# Patient Record
Sex: Female | Born: 1945 | Race: Black or African American | Hispanic: No | Smoking: Former smoker
Health system: Southern US, Community
[De-identification: ages and names within clinical notes are randomized; demographics above are authoritative.]

## PROBLEM LIST (undated history)

## (undated) DIAGNOSIS — J40 Bronchitis, not specified as acute or chronic: Secondary | ICD-10-CM

## (undated) DIAGNOSIS — IMO0001 Reserved for inherently not codable concepts without codable children: Secondary | ICD-10-CM

## (undated) DIAGNOSIS — D573 Sickle-cell trait: Secondary | ICD-10-CM

## (undated) DIAGNOSIS — N189 Chronic kidney disease, unspecified: Secondary | ICD-10-CM

## (undated) DIAGNOSIS — E119 Type 2 diabetes mellitus without complications: Secondary | ICD-10-CM

## (undated) DIAGNOSIS — E210 Primary hyperparathyroidism: Secondary | ICD-10-CM

## (undated) DIAGNOSIS — D631 Anemia in chronic kidney disease: Secondary | ICD-10-CM

## (undated) DIAGNOSIS — R011 Cardiac murmur, unspecified: Secondary | ICD-10-CM

## (undated) DIAGNOSIS — M109 Gout, unspecified: Secondary | ICD-10-CM

## (undated) DIAGNOSIS — M1990 Unspecified osteoarthritis, unspecified site: Secondary | ICD-10-CM

## (undated) DIAGNOSIS — I10 Essential (primary) hypertension: Secondary | ICD-10-CM

## (undated) DIAGNOSIS — R062 Wheezing: Secondary | ICD-10-CM

## (undated) DIAGNOSIS — N1832 Chronic kidney disease, stage 3b: Secondary | ICD-10-CM

## (undated) DIAGNOSIS — I509 Heart failure, unspecified: Secondary | ICD-10-CM

## (undated) DIAGNOSIS — G4730 Sleep apnea, unspecified: Secondary | ICD-10-CM

## (undated) DIAGNOSIS — G4733 Obstructive sleep apnea (adult) (pediatric): Secondary | ICD-10-CM

## (undated) DIAGNOSIS — D509 Iron deficiency anemia, unspecified: Secondary | ICD-10-CM

## (undated) DIAGNOSIS — Z889 Allergy status to unspecified drugs, medicaments and biological substances status: Secondary | ICD-10-CM

## (undated) DIAGNOSIS — M4802 Spinal stenosis, cervical region: Secondary | ICD-10-CM

## (undated) DIAGNOSIS — M7989 Other specified soft tissue disorders: Secondary | ICD-10-CM

## (undated) DIAGNOSIS — J45909 Unspecified asthma, uncomplicated: Secondary | ICD-10-CM

## (undated) DIAGNOSIS — N184 Chronic kidney disease, stage 4 (severe): Secondary | ICD-10-CM

## (undated) DIAGNOSIS — E1122 Type 2 diabetes mellitus with diabetic chronic kidney disease: Secondary | ICD-10-CM

## (undated) HISTORY — DX: Unspecified asthma, uncomplicated: J45.909

## (undated) HISTORY — PX: JOINT REPLACEMENT: SHX530

## (undated) HISTORY — PX: SHOULDER SURGERY: SHX246

## (undated) HISTORY — PX: ABDOMINAL HYSTERECTOMY: SHX81

## (undated) HISTORY — PX: SHOULDER ARTHROSCOPY: SHX128

## (undated) HISTORY — PX: OTHER SURGICAL HISTORY: SHX169

## (undated) HISTORY — PX: COLONOSCOPY: SHX174

---

## 1987-01-19 HISTORY — PX: BREAST BIOPSY: SHX20

## 1999-10-05 ENCOUNTER — Encounter: Admission: RE | Admit: 1999-10-05 | Discharge: 2000-01-03 | Payer: Self-pay | Admitting: Emergency Medicine

## 1999-12-25 ENCOUNTER — Ambulatory Visit (HOSPITAL_COMMUNITY): Admission: RE | Admit: 1999-12-25 | Discharge: 1999-12-25 | Payer: Self-pay | Admitting: *Deleted

## 2002-01-16 ENCOUNTER — Encounter: Payer: Self-pay | Admitting: Orthopaedic Surgery

## 2002-01-16 ENCOUNTER — Ambulatory Visit (HOSPITAL_COMMUNITY): Admission: RE | Admit: 2002-01-16 | Discharge: 2002-01-16 | Payer: Self-pay | Admitting: Orthopaedic Surgery

## 2002-06-06 ENCOUNTER — Ambulatory Visit (HOSPITAL_COMMUNITY): Admission: RE | Admit: 2002-06-06 | Discharge: 2002-06-06 | Payer: Self-pay | Admitting: Family Medicine

## 2003-12-18 ENCOUNTER — Ambulatory Visit: Payer: Self-pay | Admitting: Internal Medicine

## 2005-10-28 ENCOUNTER — Ambulatory Visit: Payer: Self-pay | Admitting: Internal Medicine

## 2007-01-05 ENCOUNTER — Ambulatory Visit: Payer: Self-pay | Admitting: Internal Medicine

## 2008-02-21 ENCOUNTER — Ambulatory Visit: Payer: Self-pay | Admitting: Internal Medicine

## 2008-07-16 ENCOUNTER — Ambulatory Visit: Payer: Self-pay | Admitting: Internal Medicine

## 2008-08-09 ENCOUNTER — Ambulatory Visit: Payer: Self-pay | Admitting: Nephrology

## 2009-02-25 ENCOUNTER — Ambulatory Visit: Payer: Self-pay | Admitting: Internal Medicine

## 2009-07-03 ENCOUNTER — Ambulatory Visit: Payer: Self-pay | Admitting: Gastroenterology

## 2009-08-21 ENCOUNTER — Ambulatory Visit: Payer: Self-pay | Admitting: Gastroenterology

## 2009-08-25 LAB — PATHOLOGY REPORT

## 2010-01-14 ENCOUNTER — Emergency Department: Payer: Self-pay | Admitting: Emergency Medicine

## 2010-03-17 ENCOUNTER — Ambulatory Visit: Payer: Self-pay | Admitting: Internal Medicine

## 2010-10-26 ENCOUNTER — Ambulatory Visit: Payer: Self-pay | Admitting: Internal Medicine

## 2010-11-19 ENCOUNTER — Ambulatory Visit: Payer: Self-pay | Admitting: Internal Medicine

## 2011-01-11 ENCOUNTER — Ambulatory Visit: Payer: Self-pay | Admitting: Internal Medicine

## 2011-01-19 ENCOUNTER — Ambulatory Visit: Payer: Self-pay | Admitting: Internal Medicine

## 2011-01-21 DIAGNOSIS — L219 Seborrheic dermatitis, unspecified: Secondary | ICD-10-CM | POA: Diagnosis not present

## 2011-01-21 DIAGNOSIS — T7840XA Allergy, unspecified, initial encounter: Secondary | ICD-10-CM | POA: Diagnosis not present

## 2011-01-29 DIAGNOSIS — J209 Acute bronchitis, unspecified: Secondary | ICD-10-CM | POA: Diagnosis not present

## 2011-01-29 DIAGNOSIS — K219 Gastro-esophageal reflux disease without esophagitis: Secondary | ICD-10-CM | POA: Diagnosis not present

## 2011-01-29 DIAGNOSIS — E1159 Type 2 diabetes mellitus with other circulatory complications: Secondary | ICD-10-CM | POA: Diagnosis not present

## 2011-01-29 DIAGNOSIS — J309 Allergic rhinitis, unspecified: Secondary | ICD-10-CM | POA: Diagnosis not present

## 2011-01-29 DIAGNOSIS — I509 Heart failure, unspecified: Secondary | ICD-10-CM | POA: Diagnosis not present

## 2011-01-29 DIAGNOSIS — I110 Hypertensive heart disease with heart failure: Secondary | ICD-10-CM | POA: Diagnosis not present

## 2011-03-08 ENCOUNTER — Ambulatory Visit: Payer: Self-pay | Admitting: Nephrology

## 2011-03-08 DIAGNOSIS — E213 Hyperparathyroidism, unspecified: Secondary | ICD-10-CM | POA: Diagnosis not present

## 2011-03-30 ENCOUNTER — Ambulatory Visit: Payer: Self-pay | Admitting: Internal Medicine

## 2011-04-24 ENCOUNTER — Ambulatory Visit: Payer: Self-pay | Admitting: Internal Medicine

## 2011-06-15 DIAGNOSIS — H4010X0 Unspecified open-angle glaucoma, stage unspecified: Secondary | ICD-10-CM | POA: Diagnosis not present

## 2011-07-05 DIAGNOSIS — J309 Allergic rhinitis, unspecified: Secondary | ICD-10-CM | POA: Diagnosis not present

## 2011-07-05 DIAGNOSIS — J019 Acute sinusitis, unspecified: Secondary | ICD-10-CM | POA: Diagnosis not present

## 2011-07-05 DIAGNOSIS — M542 Cervicalgia: Secondary | ICD-10-CM | POA: Diagnosis not present

## 2011-07-05 DIAGNOSIS — I10 Essential (primary) hypertension: Secondary | ICD-10-CM | POA: Diagnosis not present

## 2011-07-05 DIAGNOSIS — E119 Type 2 diabetes mellitus without complications: Secondary | ICD-10-CM | POA: Diagnosis not present

## 2011-07-08 DIAGNOSIS — D649 Anemia, unspecified: Secondary | ICD-10-CM | POA: Diagnosis not present

## 2011-07-08 DIAGNOSIS — I43 Cardiomyopathy in diseases classified elsewhere: Secondary | ICD-10-CM | POA: Diagnosis not present

## 2011-07-08 DIAGNOSIS — I10 Essential (primary) hypertension: Secondary | ICD-10-CM | POA: Diagnosis not present

## 2011-07-08 DIAGNOSIS — M109 Gout, unspecified: Secondary | ICD-10-CM | POA: Diagnosis not present

## 2011-07-08 DIAGNOSIS — R0989 Other specified symptoms and signs involving the circulatory and respiratory systems: Secondary | ICD-10-CM | POA: Diagnosis not present

## 2011-07-08 DIAGNOSIS — R0609 Other forms of dyspnea: Secondary | ICD-10-CM | POA: Diagnosis not present

## 2011-07-08 DIAGNOSIS — IMO0002 Reserved for concepts with insufficient information to code with codable children: Secondary | ICD-10-CM | POA: Diagnosis not present

## 2011-07-08 DIAGNOSIS — E1065 Type 1 diabetes mellitus with hyperglycemia: Secondary | ICD-10-CM | POA: Diagnosis not present

## 2011-07-08 DIAGNOSIS — E782 Mixed hyperlipidemia: Secondary | ICD-10-CM | POA: Diagnosis not present

## 2011-07-27 DIAGNOSIS — I359 Nonrheumatic aortic valve disorder, unspecified: Secondary | ICD-10-CM | POA: Diagnosis not present

## 2011-07-27 DIAGNOSIS — G4730 Sleep apnea, unspecified: Secondary | ICD-10-CM | POA: Diagnosis not present

## 2011-07-27 DIAGNOSIS — G4710 Hypersomnia, unspecified: Secondary | ICD-10-CM | POA: Diagnosis not present

## 2011-07-27 DIAGNOSIS — I110 Hypertensive heart disease with heart failure: Secondary | ICD-10-CM | POA: Diagnosis not present

## 2011-08-16 DIAGNOSIS — J019 Acute sinusitis, unspecified: Secondary | ICD-10-CM | POA: Diagnosis not present

## 2011-08-16 DIAGNOSIS — J309 Allergic rhinitis, unspecified: Secondary | ICD-10-CM | POA: Diagnosis not present

## 2011-08-16 DIAGNOSIS — J069 Acute upper respiratory infection, unspecified: Secondary | ICD-10-CM | POA: Diagnosis not present

## 2011-10-14 DIAGNOSIS — M109 Gout, unspecified: Secondary | ICD-10-CM | POA: Diagnosis not present

## 2011-10-14 DIAGNOSIS — J309 Allergic rhinitis, unspecified: Secondary | ICD-10-CM | POA: Diagnosis not present

## 2011-10-14 DIAGNOSIS — E1065 Type 1 diabetes mellitus with hyperglycemia: Secondary | ICD-10-CM | POA: Diagnosis not present

## 2011-10-14 DIAGNOSIS — IMO0002 Reserved for concepts with insufficient information to code with codable children: Secondary | ICD-10-CM | POA: Diagnosis not present

## 2011-10-14 DIAGNOSIS — D649 Anemia, unspecified: Secondary | ICD-10-CM | POA: Diagnosis not present

## 2011-10-14 DIAGNOSIS — I43 Cardiomyopathy in diseases classified elsewhere: Secondary | ICD-10-CM | POA: Diagnosis not present

## 2011-10-14 DIAGNOSIS — I10 Essential (primary) hypertension: Secondary | ICD-10-CM | POA: Diagnosis not present

## 2011-11-01 DIAGNOSIS — H4010X0 Unspecified open-angle glaucoma, stage unspecified: Secondary | ICD-10-CM | POA: Diagnosis not present

## 2011-11-13 DIAGNOSIS — Z23 Encounter for immunization: Secondary | ICD-10-CM | POA: Diagnosis not present

## 2011-12-07 DIAGNOSIS — G4710 Hypersomnia, unspecified: Secondary | ICD-10-CM | POA: Diagnosis not present

## 2011-12-07 DIAGNOSIS — I359 Nonrheumatic aortic valve disorder, unspecified: Secondary | ICD-10-CM | POA: Diagnosis not present

## 2011-12-07 DIAGNOSIS — I110 Hypertensive heart disease with heart failure: Secondary | ICD-10-CM | POA: Diagnosis not present

## 2012-01-03 DIAGNOSIS — K219 Gastro-esophageal reflux disease without esophagitis: Secondary | ICD-10-CM | POA: Diagnosis not present

## 2012-01-03 DIAGNOSIS — M109 Gout, unspecified: Secondary | ICD-10-CM | POA: Diagnosis not present

## 2012-01-03 DIAGNOSIS — E785 Hyperlipidemia, unspecified: Secondary | ICD-10-CM | POA: Diagnosis not present

## 2012-01-03 DIAGNOSIS — I509 Heart failure, unspecified: Secondary | ICD-10-CM | POA: Diagnosis not present

## 2012-01-03 DIAGNOSIS — IMO0001 Reserved for inherently not codable concepts without codable children: Secondary | ICD-10-CM | POA: Diagnosis not present

## 2012-01-03 DIAGNOSIS — I110 Hypertensive heart disease with heart failure: Secondary | ICD-10-CM | POA: Diagnosis not present

## 2012-03-27 DIAGNOSIS — IMO0002 Reserved for concepts with insufficient information to code with codable children: Secondary | ICD-10-CM | POA: Diagnosis not present

## 2012-03-27 DIAGNOSIS — E782 Mixed hyperlipidemia: Secondary | ICD-10-CM | POA: Diagnosis not present

## 2012-03-27 DIAGNOSIS — I10 Essential (primary) hypertension: Secondary | ICD-10-CM | POA: Diagnosis not present

## 2012-04-04 ENCOUNTER — Ambulatory Visit: Payer: Self-pay | Admitting: Internal Medicine

## 2012-04-04 DIAGNOSIS — Z1231 Encounter for screening mammogram for malignant neoplasm of breast: Secondary | ICD-10-CM | POA: Diagnosis not present

## 2012-04-10 DIAGNOSIS — IMO0001 Reserved for inherently not codable concepts without codable children: Secondary | ICD-10-CM | POA: Diagnosis not present

## 2012-04-10 DIAGNOSIS — Q846 Other congenital malformations of nails: Secondary | ICD-10-CM | POA: Diagnosis not present

## 2012-04-10 DIAGNOSIS — M1710 Unilateral primary osteoarthritis, unspecified knee: Secondary | ICD-10-CM | POA: Diagnosis not present

## 2012-04-10 DIAGNOSIS — E785 Hyperlipidemia, unspecified: Secondary | ICD-10-CM | POA: Diagnosis not present

## 2012-04-10 DIAGNOSIS — I10 Essential (primary) hypertension: Secondary | ICD-10-CM | POA: Diagnosis not present

## 2012-04-12 DIAGNOSIS — N183 Chronic kidney disease, stage 3 unspecified: Secondary | ICD-10-CM | POA: Diagnosis not present

## 2012-04-12 DIAGNOSIS — I10 Essential (primary) hypertension: Secondary | ICD-10-CM | POA: Diagnosis not present

## 2012-04-12 DIAGNOSIS — E210 Primary hyperparathyroidism: Secondary | ICD-10-CM | POA: Diagnosis not present

## 2012-04-12 DIAGNOSIS — D631 Anemia in chronic kidney disease: Secondary | ICD-10-CM | POA: Diagnosis not present

## 2012-04-13 DIAGNOSIS — M1710 Unilateral primary osteoarthritis, unspecified knee: Secondary | ICD-10-CM | POA: Diagnosis not present

## 2012-04-13 DIAGNOSIS — IMO0002 Reserved for concepts with insufficient information to code with codable children: Secondary | ICD-10-CM | POA: Diagnosis not present

## 2012-04-17 DIAGNOSIS — E119 Type 2 diabetes mellitus without complications: Secondary | ICD-10-CM | POA: Diagnosis not present

## 2012-04-17 DIAGNOSIS — B351 Tinea unguium: Secondary | ICD-10-CM | POA: Diagnosis not present

## 2012-04-20 DIAGNOSIS — Z006 Encounter for examination for normal comparison and control in clinical research program: Secondary | ICD-10-CM | POA: Diagnosis not present

## 2012-04-20 DIAGNOSIS — R05 Cough: Secondary | ICD-10-CM | POA: Diagnosis not present

## 2012-04-20 DIAGNOSIS — R059 Cough, unspecified: Secondary | ICD-10-CM | POA: Diagnosis not present

## 2012-04-28 DIAGNOSIS — I6529 Occlusion and stenosis of unspecified carotid artery: Secondary | ICD-10-CM | POA: Diagnosis not present

## 2012-05-27 ENCOUNTER — Ambulatory Visit: Payer: Self-pay | Admitting: Emergency Medicine

## 2012-06-13 DIAGNOSIS — H4010X0 Unspecified open-angle glaucoma, stage unspecified: Secondary | ICD-10-CM | POA: Diagnosis not present

## 2012-07-11 DIAGNOSIS — I509 Heart failure, unspecified: Secondary | ICD-10-CM | POA: Diagnosis not present

## 2012-07-11 DIAGNOSIS — N183 Chronic kidney disease, stage 3 unspecified: Secondary | ICD-10-CM | POA: Diagnosis not present

## 2012-07-11 DIAGNOSIS — E1159 Type 2 diabetes mellitus with other circulatory complications: Secondary | ICD-10-CM | POA: Diagnosis not present

## 2012-07-11 DIAGNOSIS — M109 Gout, unspecified: Secondary | ICD-10-CM | POA: Diagnosis not present

## 2012-07-11 DIAGNOSIS — I110 Hypertensive heart disease with heart failure: Secondary | ICD-10-CM | POA: Diagnosis not present

## 2012-07-11 DIAGNOSIS — M159 Polyosteoarthritis, unspecified: Secondary | ICD-10-CM | POA: Diagnosis not present

## 2012-07-13 DIAGNOSIS — G4730 Sleep apnea, unspecified: Secondary | ICD-10-CM | POA: Diagnosis not present

## 2012-07-13 DIAGNOSIS — G4710 Hypersomnia, unspecified: Secondary | ICD-10-CM | POA: Diagnosis not present

## 2012-08-02 DIAGNOSIS — D631 Anemia in chronic kidney disease: Secondary | ICD-10-CM | POA: Diagnosis not present

## 2012-08-02 DIAGNOSIS — N183 Chronic kidney disease, stage 3 unspecified: Secondary | ICD-10-CM | POA: Diagnosis not present

## 2012-08-02 DIAGNOSIS — I10 Essential (primary) hypertension: Secondary | ICD-10-CM | POA: Diagnosis not present

## 2012-08-02 DIAGNOSIS — E210 Primary hyperparathyroidism: Secondary | ICD-10-CM | POA: Diagnosis not present

## 2012-08-02 DIAGNOSIS — N039 Chronic nephritic syndrome with unspecified morphologic changes: Secondary | ICD-10-CM | POA: Diagnosis not present

## 2012-08-12 ENCOUNTER — Ambulatory Visit: Payer: Self-pay

## 2012-08-12 DIAGNOSIS — IMO0002 Reserved for concepts with insufficient information to code with codable children: Secondary | ICD-10-CM | POA: Diagnosis not present

## 2012-08-17 DIAGNOSIS — M1710 Unilateral primary osteoarthritis, unspecified knee: Secondary | ICD-10-CM | POA: Diagnosis not present

## 2012-08-17 DIAGNOSIS — IMO0002 Reserved for concepts with insufficient information to code with codable children: Secondary | ICD-10-CM | POA: Diagnosis not present

## 2012-09-29 DIAGNOSIS — IMO0002 Reserved for concepts with insufficient information to code with codable children: Secondary | ICD-10-CM | POA: Diagnosis not present

## 2012-09-29 DIAGNOSIS — M1710 Unilateral primary osteoarthritis, unspecified knee: Secondary | ICD-10-CM | POA: Diagnosis not present

## 2012-10-10 DIAGNOSIS — N183 Chronic kidney disease, stage 3 unspecified: Secondary | ICD-10-CM | POA: Diagnosis not present

## 2012-10-10 DIAGNOSIS — Z79899 Other long term (current) drug therapy: Secondary | ICD-10-CM | POA: Diagnosis not present

## 2012-10-10 DIAGNOSIS — I110 Hypertensive heart disease with heart failure: Secondary | ICD-10-CM | POA: Diagnosis not present

## 2012-10-10 DIAGNOSIS — E782 Mixed hyperlipidemia: Secondary | ICD-10-CM | POA: Diagnosis not present

## 2012-10-10 DIAGNOSIS — IMO0002 Reserved for concepts with insufficient information to code with codable children: Secondary | ICD-10-CM | POA: Diagnosis not present

## 2012-10-10 DIAGNOSIS — E1065 Type 1 diabetes mellitus with hyperglycemia: Secondary | ICD-10-CM | POA: Diagnosis not present

## 2012-10-10 DIAGNOSIS — M159 Polyosteoarthritis, unspecified: Secondary | ICD-10-CM | POA: Diagnosis not present

## 2012-10-16 DIAGNOSIS — J309 Allergic rhinitis, unspecified: Secondary | ICD-10-CM | POA: Diagnosis not present

## 2012-10-16 DIAGNOSIS — J019 Acute sinusitis, unspecified: Secondary | ICD-10-CM | POA: Diagnosis not present

## 2012-10-17 ENCOUNTER — Ambulatory Visit: Payer: Self-pay | Admitting: Unknown Physician Specialty

## 2012-10-17 DIAGNOSIS — M25569 Pain in unspecified knee: Secondary | ICD-10-CM | POA: Diagnosis not present

## 2012-11-21 DIAGNOSIS — M1710 Unilateral primary osteoarthritis, unspecified knee: Secondary | ICD-10-CM | POA: Diagnosis not present

## 2012-11-21 DIAGNOSIS — IMO0002 Reserved for concepts with insufficient information to code with codable children: Secondary | ICD-10-CM | POA: Diagnosis not present

## 2012-11-24 DIAGNOSIS — D631 Anemia in chronic kidney disease: Secondary | ICD-10-CM | POA: Diagnosis not present

## 2012-11-24 DIAGNOSIS — I10 Essential (primary) hypertension: Secondary | ICD-10-CM | POA: Diagnosis not present

## 2012-11-24 DIAGNOSIS — N039 Chronic nephritic syndrome with unspecified morphologic changes: Secondary | ICD-10-CM | POA: Diagnosis not present

## 2012-11-24 DIAGNOSIS — N2581 Secondary hyperparathyroidism of renal origin: Secondary | ICD-10-CM | POA: Diagnosis not present

## 2012-11-24 DIAGNOSIS — N183 Chronic kidney disease, stage 3 unspecified: Secondary | ICD-10-CM | POA: Diagnosis not present

## 2012-12-12 DIAGNOSIS — E782 Mixed hyperlipidemia: Secondary | ICD-10-CM | POA: Diagnosis not present

## 2012-12-12 DIAGNOSIS — IMO0002 Reserved for concepts with insufficient information to code with codable children: Secondary | ICD-10-CM | POA: Diagnosis not present

## 2012-12-12 DIAGNOSIS — E1065 Type 1 diabetes mellitus with hyperglycemia: Secondary | ICD-10-CM | POA: Diagnosis not present

## 2012-12-12 DIAGNOSIS — Z79899 Other long term (current) drug therapy: Secondary | ICD-10-CM | POA: Diagnosis not present

## 2012-12-12 DIAGNOSIS — N183 Chronic kidney disease, stage 3 unspecified: Secondary | ICD-10-CM | POA: Diagnosis not present

## 2012-12-12 DIAGNOSIS — I110 Hypertensive heart disease with heart failure: Secondary | ICD-10-CM | POA: Diagnosis not present

## 2012-12-12 DIAGNOSIS — M159 Polyosteoarthritis, unspecified: Secondary | ICD-10-CM | POA: Diagnosis not present

## 2013-01-04 DIAGNOSIS — H35379 Puckering of macula, unspecified eye: Secondary | ICD-10-CM | POA: Diagnosis not present

## 2013-01-08 DIAGNOSIS — G4710 Hypersomnia, unspecified: Secondary | ICD-10-CM | POA: Diagnosis not present

## 2013-01-08 DIAGNOSIS — J019 Acute sinusitis, unspecified: Secondary | ICD-10-CM | POA: Diagnosis not present

## 2013-01-26 DIAGNOSIS — E119 Type 2 diabetes mellitus without complications: Secondary | ICD-10-CM | POA: Diagnosis not present

## 2013-01-26 DIAGNOSIS — I10 Essential (primary) hypertension: Secondary | ICD-10-CM | POA: Diagnosis not present

## 2013-01-26 DIAGNOSIS — E782 Mixed hyperlipidemia: Secondary | ICD-10-CM | POA: Diagnosis not present

## 2013-02-08 DIAGNOSIS — I110 Hypertensive heart disease with heart failure: Secondary | ICD-10-CM | POA: Diagnosis not present

## 2013-02-08 DIAGNOSIS — H01139 Eczematous dermatitis of unspecified eye, unspecified eyelid: Secondary | ICD-10-CM | POA: Diagnosis not present

## 2013-02-08 DIAGNOSIS — I509 Heart failure, unspecified: Secondary | ICD-10-CM | POA: Diagnosis not present

## 2013-02-08 DIAGNOSIS — N183 Chronic kidney disease, stage 3 unspecified: Secondary | ICD-10-CM | POA: Diagnosis not present

## 2013-02-08 DIAGNOSIS — IMO0001 Reserved for inherently not codable concepts without codable children: Secondary | ICD-10-CM | POA: Diagnosis not present

## 2013-02-08 DIAGNOSIS — R609 Edema, unspecified: Secondary | ICD-10-CM | POA: Diagnosis not present

## 2013-02-19 DIAGNOSIS — R609 Edema, unspecified: Secondary | ICD-10-CM | POA: Diagnosis not present

## 2013-02-19 DIAGNOSIS — H01139 Eczematous dermatitis of unspecified eye, unspecified eyelid: Secondary | ICD-10-CM | POA: Diagnosis not present

## 2013-02-19 DIAGNOSIS — N183 Chronic kidney disease, stage 3 unspecified: Secondary | ICD-10-CM | POA: Diagnosis not present

## 2013-02-19 DIAGNOSIS — I110 Hypertensive heart disease with heart failure: Secondary | ICD-10-CM | POA: Diagnosis not present

## 2013-02-19 DIAGNOSIS — IMO0001 Reserved for inherently not codable concepts without codable children: Secondary | ICD-10-CM | POA: Diagnosis not present

## 2013-02-19 DIAGNOSIS — I509 Heart failure, unspecified: Secondary | ICD-10-CM | POA: Diagnosis not present

## 2013-03-29 DIAGNOSIS — IMO0002 Reserved for concepts with insufficient information to code with codable children: Secondary | ICD-10-CM | POA: Diagnosis not present

## 2013-03-29 DIAGNOSIS — M1710 Unilateral primary osteoarthritis, unspecified knee: Secondary | ICD-10-CM | POA: Diagnosis not present

## 2013-05-03 DIAGNOSIS — M1710 Unilateral primary osteoarthritis, unspecified knee: Secondary | ICD-10-CM | POA: Diagnosis not present

## 2013-05-03 DIAGNOSIS — IMO0002 Reserved for concepts with insufficient information to code with codable children: Secondary | ICD-10-CM | POA: Diagnosis not present

## 2013-05-29 DIAGNOSIS — R0989 Other specified symptoms and signs involving the circulatory and respiratory systems: Secondary | ICD-10-CM | POA: Diagnosis not present

## 2013-05-29 DIAGNOSIS — E1159 Type 2 diabetes mellitus with other circulatory complications: Secondary | ICD-10-CM | POA: Diagnosis not present

## 2013-05-29 DIAGNOSIS — J309 Allergic rhinitis, unspecified: Secondary | ICD-10-CM | POA: Diagnosis not present

## 2013-05-29 DIAGNOSIS — R0609 Other forms of dyspnea: Secondary | ICD-10-CM | POA: Diagnosis not present

## 2013-05-29 DIAGNOSIS — N183 Chronic kidney disease, stage 3 unspecified: Secondary | ICD-10-CM | POA: Diagnosis not present

## 2013-06-18 DIAGNOSIS — IMO0001 Reserved for inherently not codable concepts without codable children: Secondary | ICD-10-CM | POA: Diagnosis not present

## 2013-06-18 DIAGNOSIS — I10 Essential (primary) hypertension: Secondary | ICD-10-CM | POA: Diagnosis not present

## 2013-06-21 ENCOUNTER — Ambulatory Visit: Payer: Self-pay | Admitting: Internal Medicine

## 2013-07-16 DIAGNOSIS — E1159 Type 2 diabetes mellitus with other circulatory complications: Secondary | ICD-10-CM | POA: Diagnosis not present

## 2013-07-16 DIAGNOSIS — M810 Age-related osteoporosis without current pathological fracture: Secondary | ICD-10-CM | POA: Diagnosis not present

## 2013-07-16 DIAGNOSIS — I10 Essential (primary) hypertension: Secondary | ICD-10-CM | POA: Diagnosis not present

## 2013-07-16 DIAGNOSIS — G4730 Sleep apnea, unspecified: Secondary | ICD-10-CM | POA: Diagnosis not present

## 2013-07-16 DIAGNOSIS — D638 Anemia in other chronic diseases classified elsewhere: Secondary | ICD-10-CM | POA: Diagnosis not present

## 2013-07-16 DIAGNOSIS — E785 Hyperlipidemia, unspecified: Secondary | ICD-10-CM | POA: Diagnosis not present

## 2013-07-16 DIAGNOSIS — N183 Chronic kidney disease, stage 3 unspecified: Secondary | ICD-10-CM | POA: Diagnosis not present

## 2013-07-17 ENCOUNTER — Ambulatory Visit: Payer: Self-pay | Admitting: Unknown Physician Specialty

## 2013-07-17 DIAGNOSIS — M1710 Unilateral primary osteoarthritis, unspecified knee: Secondary | ICD-10-CM | POA: Diagnosis not present

## 2013-07-17 DIAGNOSIS — IMO0002 Reserved for concepts with insufficient information to code with codable children: Secondary | ICD-10-CM | POA: Diagnosis not present

## 2013-07-18 DIAGNOSIS — G4710 Hypersomnia, unspecified: Secondary | ICD-10-CM | POA: Diagnosis not present

## 2013-07-18 DIAGNOSIS — J309 Allergic rhinitis, unspecified: Secondary | ICD-10-CM | POA: Diagnosis not present

## 2013-07-18 DIAGNOSIS — G4730 Sleep apnea, unspecified: Secondary | ICD-10-CM | POA: Diagnosis not present

## 2013-07-30 DIAGNOSIS — R609 Edema, unspecified: Secondary | ICD-10-CM | POA: Diagnosis not present

## 2013-07-30 DIAGNOSIS — E1159 Type 2 diabetes mellitus with other circulatory complications: Secondary | ICD-10-CM | POA: Diagnosis not present

## 2013-07-30 DIAGNOSIS — R21 Rash and other nonspecific skin eruption: Secondary | ICD-10-CM | POA: Diagnosis not present

## 2013-07-30 DIAGNOSIS — I110 Hypertensive heart disease with heart failure: Secondary | ICD-10-CM | POA: Diagnosis not present

## 2013-08-02 DIAGNOSIS — M1990 Unspecified osteoarthritis, unspecified site: Secondary | ICD-10-CM | POA: Insufficient documentation

## 2013-08-07 DIAGNOSIS — M159 Polyosteoarthritis, unspecified: Secondary | ICD-10-CM | POA: Diagnosis not present

## 2013-08-07 DIAGNOSIS — G4730 Sleep apnea, unspecified: Secondary | ICD-10-CM | POA: Diagnosis not present

## 2013-08-07 DIAGNOSIS — M25569 Pain in unspecified knee: Secondary | ICD-10-CM | POA: Diagnosis not present

## 2013-08-07 DIAGNOSIS — N183 Chronic kidney disease, stage 3 unspecified: Secondary | ICD-10-CM | POA: Diagnosis not present

## 2013-08-07 DIAGNOSIS — IMO0001 Reserved for inherently not codable concepts without codable children: Secondary | ICD-10-CM | POA: Diagnosis not present

## 2013-08-09 DIAGNOSIS — G4710 Hypersomnia, unspecified: Secondary | ICD-10-CM | POA: Diagnosis not present

## 2013-08-09 DIAGNOSIS — G4730 Sleep apnea, unspecified: Secondary | ICD-10-CM | POA: Diagnosis not present

## 2013-08-09 DIAGNOSIS — G4720 Circadian rhythm sleep disorder, unspecified type: Secondary | ICD-10-CM | POA: Diagnosis not present

## 2013-08-13 DIAGNOSIS — M1710 Unilateral primary osteoarthritis, unspecified knee: Secondary | ICD-10-CM | POA: Diagnosis not present

## 2013-08-13 DIAGNOSIS — IMO0002 Reserved for concepts with insufficient information to code with codable children: Secondary | ICD-10-CM | POA: Diagnosis not present

## 2013-08-13 HISTORY — PX: MEDIAL PARTIAL KNEE REPLACEMENT: SHX5965

## 2013-08-14 DIAGNOSIS — I10 Essential (primary) hypertension: Secondary | ICD-10-CM | POA: Diagnosis not present

## 2013-08-14 DIAGNOSIS — E119 Type 2 diabetes mellitus without complications: Secondary | ICD-10-CM | POA: Diagnosis not present

## 2013-08-14 DIAGNOSIS — E785 Hyperlipidemia, unspecified: Secondary | ICD-10-CM | POA: Diagnosis not present

## 2013-08-14 DIAGNOSIS — M1990 Unspecified osteoarthritis, unspecified site: Secondary | ICD-10-CM | POA: Diagnosis not present

## 2013-08-16 ENCOUNTER — Encounter: Payer: Self-pay | Admitting: Internal Medicine

## 2013-08-17 DIAGNOSIS — M159 Polyosteoarthritis, unspecified: Secondary | ICD-10-CM | POA: Diagnosis not present

## 2013-08-17 DIAGNOSIS — I509 Heart failure, unspecified: Secondary | ICD-10-CM | POA: Diagnosis not present

## 2013-08-17 DIAGNOSIS — M109 Gout, unspecified: Secondary | ICD-10-CM | POA: Diagnosis not present

## 2013-08-17 LAB — BASIC METABOLIC PANEL
Anion Gap: 7 (ref 7–16)
BUN: 31 mg/dL — ABNORMAL HIGH (ref 7–18)
Calcium, Total: 9.2 mg/dL (ref 8.5–10.1)
Chloride: 109 mmol/L — ABNORMAL HIGH (ref 98–107)
Co2: 25 mmol/L (ref 21–32)
Creatinine: 1.8 mg/dL — ABNORMAL HIGH (ref 0.60–1.30)
EGFR (African American): 33 — ABNORMAL LOW
EGFR (Non-African Amer.): 28 — ABNORMAL LOW
Glucose: 110 mg/dL — ABNORMAL HIGH (ref 65–99)
Osmolality: 288 (ref 275–301)
Potassium: 4.3 mmol/L (ref 3.5–5.1)
Sodium: 141 mmol/L (ref 136–145)

## 2013-08-18 ENCOUNTER — Encounter: Payer: Self-pay | Admitting: Internal Medicine

## 2013-08-21 DIAGNOSIS — Z96659 Presence of unspecified artificial knee joint: Secondary | ICD-10-CM | POA: Diagnosis not present

## 2013-08-21 DIAGNOSIS — Z471 Aftercare following joint replacement surgery: Secondary | ICD-10-CM | POA: Diagnosis not present

## 2013-08-21 LAB — BASIC METABOLIC PANEL
Anion Gap: 6 — ABNORMAL LOW (ref 7–16)
BUN: 36 mg/dL — ABNORMAL HIGH (ref 7–18)
Calcium, Total: 10.4 mg/dL — ABNORMAL HIGH (ref 8.5–10.1)
Chloride: 108 mmol/L — ABNORMAL HIGH (ref 98–107)
Co2: 29 mmol/L (ref 21–32)
Creatinine: 1.51 mg/dL — ABNORMAL HIGH (ref 0.60–1.30)
EGFR (African American): 41 — ABNORMAL LOW
EGFR (Non-African Amer.): 35 — ABNORMAL LOW
Glucose: 86 mg/dL (ref 65–99)
Osmolality: 293 (ref 275–301)
Potassium: 4.2 mmol/L (ref 3.5–5.1)
Sodium: 143 mmol/L (ref 136–145)

## 2013-08-30 DIAGNOSIS — M6281 Muscle weakness (generalized): Secondary | ICD-10-CM | POA: Diagnosis not present

## 2013-08-30 DIAGNOSIS — M25669 Stiffness of unspecified knee, not elsewhere classified: Secondary | ICD-10-CM | POA: Diagnosis not present

## 2013-08-30 DIAGNOSIS — Z96659 Presence of unspecified artificial knee joint: Secondary | ICD-10-CM | POA: Diagnosis not present

## 2013-08-30 DIAGNOSIS — M25569 Pain in unspecified knee: Secondary | ICD-10-CM | POA: Diagnosis not present

## 2013-09-04 DIAGNOSIS — M25569 Pain in unspecified knee: Secondary | ICD-10-CM | POA: Diagnosis not present

## 2013-09-04 DIAGNOSIS — M25669 Stiffness of unspecified knee, not elsewhere classified: Secondary | ICD-10-CM | POA: Diagnosis not present

## 2013-09-04 DIAGNOSIS — Z96659 Presence of unspecified artificial knee joint: Secondary | ICD-10-CM | POA: Diagnosis not present

## 2013-09-04 DIAGNOSIS — M6281 Muscle weakness (generalized): Secondary | ICD-10-CM | POA: Diagnosis not present

## 2013-09-05 DIAGNOSIS — Z96659 Presence of unspecified artificial knee joint: Secondary | ICD-10-CM | POA: Diagnosis not present

## 2013-09-05 DIAGNOSIS — M25569 Pain in unspecified knee: Secondary | ICD-10-CM | POA: Diagnosis not present

## 2013-09-05 DIAGNOSIS — M6281 Muscle weakness (generalized): Secondary | ICD-10-CM | POA: Diagnosis not present

## 2013-09-05 DIAGNOSIS — M25669 Stiffness of unspecified knee, not elsewhere classified: Secondary | ICD-10-CM | POA: Diagnosis not present

## 2013-09-07 DIAGNOSIS — M25669 Stiffness of unspecified knee, not elsewhere classified: Secondary | ICD-10-CM | POA: Diagnosis not present

## 2013-09-07 DIAGNOSIS — M25569 Pain in unspecified knee: Secondary | ICD-10-CM | POA: Diagnosis not present

## 2013-09-07 DIAGNOSIS — Z96659 Presence of unspecified artificial knee joint: Secondary | ICD-10-CM | POA: Diagnosis not present

## 2013-09-07 DIAGNOSIS — M6281 Muscle weakness (generalized): Secondary | ICD-10-CM | POA: Diagnosis not present

## 2013-09-10 DIAGNOSIS — M25669 Stiffness of unspecified knee, not elsewhere classified: Secondary | ICD-10-CM | POA: Diagnosis not present

## 2013-09-10 DIAGNOSIS — M6281 Muscle weakness (generalized): Secondary | ICD-10-CM | POA: Diagnosis not present

## 2013-09-10 DIAGNOSIS — M25569 Pain in unspecified knee: Secondary | ICD-10-CM | POA: Diagnosis not present

## 2013-09-10 DIAGNOSIS — Z96659 Presence of unspecified artificial knee joint: Secondary | ICD-10-CM | POA: Diagnosis not present

## 2013-09-12 DIAGNOSIS — M25569 Pain in unspecified knee: Secondary | ICD-10-CM | POA: Diagnosis not present

## 2013-09-12 DIAGNOSIS — M25669 Stiffness of unspecified knee, not elsewhere classified: Secondary | ICD-10-CM | POA: Diagnosis not present

## 2013-09-12 DIAGNOSIS — M6281 Muscle weakness (generalized): Secondary | ICD-10-CM | POA: Diagnosis not present

## 2013-09-12 DIAGNOSIS — Z96659 Presence of unspecified artificial knee joint: Secondary | ICD-10-CM | POA: Diagnosis not present

## 2013-09-14 DIAGNOSIS — M6281 Muscle weakness (generalized): Secondary | ICD-10-CM | POA: Diagnosis not present

## 2013-09-14 DIAGNOSIS — M25569 Pain in unspecified knee: Secondary | ICD-10-CM | POA: Diagnosis not present

## 2013-09-14 DIAGNOSIS — M25669 Stiffness of unspecified knee, not elsewhere classified: Secondary | ICD-10-CM | POA: Diagnosis not present

## 2013-09-14 DIAGNOSIS — Z96659 Presence of unspecified artificial knee joint: Secondary | ICD-10-CM | POA: Diagnosis not present

## 2013-09-17 DIAGNOSIS — M25569 Pain in unspecified knee: Secondary | ICD-10-CM | POA: Diagnosis not present

## 2013-09-17 DIAGNOSIS — Z96659 Presence of unspecified artificial knee joint: Secondary | ICD-10-CM | POA: Diagnosis not present

## 2013-09-17 DIAGNOSIS — M25669 Stiffness of unspecified knee, not elsewhere classified: Secondary | ICD-10-CM | POA: Diagnosis not present

## 2013-09-17 DIAGNOSIS — M6281 Muscle weakness (generalized): Secondary | ICD-10-CM | POA: Diagnosis not present

## 2013-09-18 DIAGNOSIS — M25569 Pain in unspecified knee: Secondary | ICD-10-CM | POA: Diagnosis not present

## 2013-09-19 DIAGNOSIS — Z96659 Presence of unspecified artificial knee joint: Secondary | ICD-10-CM | POA: Diagnosis not present

## 2013-09-19 DIAGNOSIS — M6281 Muscle weakness (generalized): Secondary | ICD-10-CM | POA: Diagnosis not present

## 2013-09-19 DIAGNOSIS — M25569 Pain in unspecified knee: Secondary | ICD-10-CM | POA: Diagnosis not present

## 2013-09-19 DIAGNOSIS — M25669 Stiffness of unspecified knee, not elsewhere classified: Secondary | ICD-10-CM | POA: Diagnosis not present

## 2013-09-20 DIAGNOSIS — IMO0001 Reserved for inherently not codable concepts without codable children: Secondary | ICD-10-CM | POA: Diagnosis not present

## 2013-09-20 DIAGNOSIS — N183 Chronic kidney disease, stage 3 unspecified: Secondary | ICD-10-CM | POA: Diagnosis not present

## 2013-09-20 DIAGNOSIS — M109 Gout, unspecified: Secondary | ICD-10-CM | POA: Diagnosis not present

## 2013-09-20 DIAGNOSIS — I10 Essential (primary) hypertension: Secondary | ICD-10-CM | POA: Diagnosis not present

## 2013-09-21 DIAGNOSIS — Z96659 Presence of unspecified artificial knee joint: Secondary | ICD-10-CM | POA: Diagnosis not present

## 2013-09-21 DIAGNOSIS — M25669 Stiffness of unspecified knee, not elsewhere classified: Secondary | ICD-10-CM | POA: Diagnosis not present

## 2013-09-21 DIAGNOSIS — M6281 Muscle weakness (generalized): Secondary | ICD-10-CM | POA: Diagnosis not present

## 2013-09-21 DIAGNOSIS — M25569 Pain in unspecified knee: Secondary | ICD-10-CM | POA: Diagnosis not present

## 2013-09-26 DIAGNOSIS — M109 Gout, unspecified: Secondary | ICD-10-CM | POA: Diagnosis not present

## 2013-09-26 DIAGNOSIS — R609 Edema, unspecified: Secondary | ICD-10-CM | POA: Diagnosis not present

## 2013-09-26 DIAGNOSIS — N183 Chronic kidney disease, stage 3 unspecified: Secondary | ICD-10-CM | POA: Diagnosis not present

## 2013-09-26 DIAGNOSIS — M25669 Stiffness of unspecified knee, not elsewhere classified: Secondary | ICD-10-CM | POA: Diagnosis not present

## 2013-09-26 DIAGNOSIS — M6281 Muscle weakness (generalized): Secondary | ICD-10-CM | POA: Diagnosis not present

## 2013-09-26 DIAGNOSIS — I110 Hypertensive heart disease with heart failure: Secondary | ICD-10-CM | POA: Diagnosis not present

## 2013-09-26 DIAGNOSIS — M25569 Pain in unspecified knee: Secondary | ICD-10-CM | POA: Diagnosis not present

## 2013-09-26 DIAGNOSIS — Z96659 Presence of unspecified artificial knee joint: Secondary | ICD-10-CM | POA: Diagnosis not present

## 2013-09-28 DIAGNOSIS — M25669 Stiffness of unspecified knee, not elsewhere classified: Secondary | ICD-10-CM | POA: Diagnosis not present

## 2013-09-28 DIAGNOSIS — Z96659 Presence of unspecified artificial knee joint: Secondary | ICD-10-CM | POA: Diagnosis not present

## 2013-09-28 DIAGNOSIS — M6281 Muscle weakness (generalized): Secondary | ICD-10-CM | POA: Diagnosis not present

## 2013-09-28 DIAGNOSIS — M25569 Pain in unspecified knee: Secondary | ICD-10-CM | POA: Diagnosis not present

## 2013-10-02 DIAGNOSIS — M6281 Muscle weakness (generalized): Secondary | ICD-10-CM | POA: Diagnosis not present

## 2013-10-02 DIAGNOSIS — M25569 Pain in unspecified knee: Secondary | ICD-10-CM | POA: Diagnosis not present

## 2013-10-02 DIAGNOSIS — Z96659 Presence of unspecified artificial knee joint: Secondary | ICD-10-CM | POA: Diagnosis not present

## 2013-10-02 DIAGNOSIS — M25669 Stiffness of unspecified knee, not elsewhere classified: Secondary | ICD-10-CM | POA: Diagnosis not present

## 2013-10-04 DIAGNOSIS — M25669 Stiffness of unspecified knee, not elsewhere classified: Secondary | ICD-10-CM | POA: Diagnosis not present

## 2013-10-04 DIAGNOSIS — M25569 Pain in unspecified knee: Secondary | ICD-10-CM | POA: Diagnosis not present

## 2013-10-04 DIAGNOSIS — Z96659 Presence of unspecified artificial knee joint: Secondary | ICD-10-CM | POA: Diagnosis not present

## 2013-10-05 DIAGNOSIS — H4010X0 Unspecified open-angle glaucoma, stage unspecified: Secondary | ICD-10-CM | POA: Diagnosis not present

## 2013-10-18 DIAGNOSIS — G4733 Obstructive sleep apnea (adult) (pediatric): Secondary | ICD-10-CM | POA: Diagnosis not present

## 2013-10-18 DIAGNOSIS — E119 Type 2 diabetes mellitus without complications: Secondary | ICD-10-CM | POA: Diagnosis not present

## 2013-10-22 DIAGNOSIS — E1159 Type 2 diabetes mellitus with other circulatory complications: Secondary | ICD-10-CM | POA: Diagnosis not present

## 2013-10-22 DIAGNOSIS — E785 Hyperlipidemia, unspecified: Secondary | ICD-10-CM | POA: Diagnosis not present

## 2013-10-22 DIAGNOSIS — I10 Essential (primary) hypertension: Secondary | ICD-10-CM | POA: Diagnosis not present

## 2013-10-22 DIAGNOSIS — D638 Anemia in other chronic diseases classified elsewhere: Secondary | ICD-10-CM | POA: Diagnosis not present

## 2013-10-22 DIAGNOSIS — G4730 Sleep apnea, unspecified: Secondary | ICD-10-CM | POA: Diagnosis not present

## 2013-10-22 DIAGNOSIS — N183 Chronic kidney disease, stage 3 (moderate): Secondary | ICD-10-CM | POA: Diagnosis not present

## 2013-10-22 DIAGNOSIS — M810 Age-related osteoporosis without current pathological fracture: Secondary | ICD-10-CM | POA: Diagnosis not present

## 2013-11-13 DIAGNOSIS — Z96653 Presence of artificial knee joint, bilateral: Secondary | ICD-10-CM | POA: Diagnosis not present

## 2013-11-22 DIAGNOSIS — M109 Gout, unspecified: Secondary | ICD-10-CM | POA: Diagnosis not present

## 2013-11-22 DIAGNOSIS — M810 Age-related osteoporosis without current pathological fracture: Secondary | ICD-10-CM | POA: Diagnosis not present

## 2013-11-22 DIAGNOSIS — E119 Type 2 diabetes mellitus without complications: Secondary | ICD-10-CM | POA: Diagnosis not present

## 2013-11-22 DIAGNOSIS — E1122 Type 2 diabetes mellitus with diabetic chronic kidney disease: Secondary | ICD-10-CM | POA: Diagnosis not present

## 2013-11-22 DIAGNOSIS — N183 Chronic kidney disease, stage 3 (moderate): Secondary | ICD-10-CM | POA: Diagnosis not present

## 2013-11-22 DIAGNOSIS — E669 Obesity, unspecified: Secondary | ICD-10-CM | POA: Diagnosis not present

## 2013-11-22 DIAGNOSIS — E782 Mixed hyperlipidemia: Secondary | ICD-10-CM | POA: Diagnosis not present

## 2013-11-22 DIAGNOSIS — R809 Proteinuria, unspecified: Secondary | ICD-10-CM | POA: Diagnosis not present

## 2014-01-02 DIAGNOSIS — Z96653 Presence of artificial knee joint, bilateral: Secondary | ICD-10-CM | POA: Diagnosis not present

## 2014-01-02 DIAGNOSIS — M25561 Pain in right knee: Secondary | ICD-10-CM | POA: Diagnosis not present

## 2014-01-16 DIAGNOSIS — E1159 Type 2 diabetes mellitus with other circulatory complications: Secondary | ICD-10-CM | POA: Diagnosis not present

## 2014-01-16 DIAGNOSIS — G4733 Obstructive sleep apnea (adult) (pediatric): Secondary | ICD-10-CM | POA: Diagnosis not present

## 2014-01-16 DIAGNOSIS — D351 Benign neoplasm of parathyroid gland: Secondary | ICD-10-CM | POA: Diagnosis not present

## 2014-01-29 DIAGNOSIS — E1122 Type 2 diabetes mellitus with diabetic chronic kidney disease: Secondary | ICD-10-CM | POA: Diagnosis not present

## 2014-01-29 DIAGNOSIS — Z0001 Encounter for general adult medical examination with abnormal findings: Secondary | ICD-10-CM | POA: Diagnosis not present

## 2014-01-29 DIAGNOSIS — M810 Age-related osteoporosis without current pathological fracture: Secondary | ICD-10-CM | POA: Diagnosis not present

## 2014-01-29 DIAGNOSIS — E782 Mixed hyperlipidemia: Secondary | ICD-10-CM | POA: Diagnosis not present

## 2014-01-29 DIAGNOSIS — I10 Essential (primary) hypertension: Secondary | ICD-10-CM | POA: Diagnosis not present

## 2014-01-29 DIAGNOSIS — M109 Gout, unspecified: Secondary | ICD-10-CM | POA: Diagnosis not present

## 2014-01-29 DIAGNOSIS — R809 Proteinuria, unspecified: Secondary | ICD-10-CM | POA: Diagnosis not present

## 2014-01-29 DIAGNOSIS — R300 Dysuria: Secondary | ICD-10-CM | POA: Diagnosis not present

## 2014-02-05 DIAGNOSIS — Z96653 Presence of artificial knee joint, bilateral: Secondary | ICD-10-CM | POA: Diagnosis not present

## 2014-03-25 DIAGNOSIS — E1122 Type 2 diabetes mellitus with diabetic chronic kidney disease: Secondary | ICD-10-CM | POA: Diagnosis not present

## 2014-03-29 DIAGNOSIS — R0602 Shortness of breath: Secondary | ICD-10-CM | POA: Diagnosis not present

## 2014-03-29 DIAGNOSIS — J209 Acute bronchitis, unspecified: Secondary | ICD-10-CM | POA: Diagnosis not present

## 2014-04-18 DIAGNOSIS — R21 Rash and other nonspecific skin eruption: Secondary | ICD-10-CM | POA: Diagnosis not present

## 2014-04-18 DIAGNOSIS — D225 Melanocytic nevi of trunk: Secondary | ICD-10-CM | POA: Diagnosis not present

## 2014-04-30 DIAGNOSIS — M109 Gout, unspecified: Secondary | ICD-10-CM | POA: Diagnosis not present

## 2014-04-30 DIAGNOSIS — E1122 Type 2 diabetes mellitus with diabetic chronic kidney disease: Secondary | ICD-10-CM | POA: Diagnosis not present

## 2014-04-30 DIAGNOSIS — D649 Anemia, unspecified: Secondary | ICD-10-CM | POA: Diagnosis not present

## 2014-04-30 DIAGNOSIS — Z6841 Body Mass Index (BMI) 40.0 and over, adult: Secondary | ICD-10-CM | POA: Diagnosis not present

## 2014-04-30 DIAGNOSIS — I10 Essential (primary) hypertension: Secondary | ICD-10-CM | POA: Diagnosis not present

## 2014-04-30 DIAGNOSIS — I110 Hypertensive heart disease with heart failure: Secondary | ICD-10-CM | POA: Diagnosis not present

## 2014-04-30 DIAGNOSIS — R809 Proteinuria, unspecified: Secondary | ICD-10-CM | POA: Diagnosis not present

## 2014-04-30 DIAGNOSIS — E669 Obesity, unspecified: Secondary | ICD-10-CM | POA: Diagnosis not present

## 2014-05-02 DIAGNOSIS — H4011X2 Primary open-angle glaucoma, moderate stage: Secondary | ICD-10-CM | POA: Diagnosis not present

## 2014-05-03 ENCOUNTER — Other Ambulatory Visit: Payer: Self-pay | Admitting: Internal Medicine

## 2014-05-03 DIAGNOSIS — Z1231 Encounter for screening mammogram for malignant neoplasm of breast: Secondary | ICD-10-CM

## 2014-06-25 ENCOUNTER — Encounter (INDEPENDENT_AMBULATORY_CARE_PROVIDER_SITE_OTHER): Payer: Self-pay

## 2014-06-25 ENCOUNTER — Ambulatory Visit
Admission: RE | Admit: 2014-06-25 | Discharge: 2014-06-25 | Disposition: A | Discharge status: Home / Self Care | Payer: BC Managed Care – PPO | Source: Ambulatory Visit | Attending: Internal Medicine | Admitting: Internal Medicine

## 2014-06-25 DIAGNOSIS — Z1231 Encounter for screening mammogram for malignant neoplasm of breast: Secondary | ICD-10-CM | POA: Diagnosis present

## 2014-06-25 DIAGNOSIS — R0602 Shortness of breath: Secondary | ICD-10-CM | POA: Diagnosis not present

## 2014-06-25 DIAGNOSIS — E1122 Type 2 diabetes mellitus with diabetic chronic kidney disease: Secondary | ICD-10-CM | POA: Diagnosis not present

## 2014-06-25 DIAGNOSIS — G4733 Obstructive sleep apnea (adult) (pediatric): Secondary | ICD-10-CM | POA: Diagnosis not present

## 2014-08-06 DIAGNOSIS — G4733 Obstructive sleep apnea (adult) (pediatric): Secondary | ICD-10-CM | POA: Diagnosis not present

## 2014-08-06 DIAGNOSIS — Z96653 Presence of artificial knee joint, bilateral: Secondary | ICD-10-CM | POA: Diagnosis not present

## 2014-08-06 DIAGNOSIS — J309 Allergic rhinitis, unspecified: Secondary | ICD-10-CM | POA: Diagnosis not present

## 2014-08-06 DIAGNOSIS — R0602 Shortness of breath: Secondary | ICD-10-CM | POA: Diagnosis not present

## 2014-08-06 DIAGNOSIS — Z96659 Presence of unspecified artificial knee joint: Secondary | ICD-10-CM | POA: Diagnosis not present

## 2014-08-12 DIAGNOSIS — M25532 Pain in left wrist: Secondary | ICD-10-CM | POA: Insufficient documentation

## 2014-08-29 DIAGNOSIS — E213 Hyperparathyroidism, unspecified: Secondary | ICD-10-CM | POA: Diagnosis not present

## 2014-08-29 DIAGNOSIS — E1122 Type 2 diabetes mellitus with diabetic chronic kidney disease: Secondary | ICD-10-CM | POA: Diagnosis not present

## 2014-08-29 DIAGNOSIS — E782 Mixed hyperlipidemia: Secondary | ICD-10-CM | POA: Diagnosis not present

## 2014-08-29 DIAGNOSIS — I110 Hypertensive heart disease with heart failure: Secondary | ICD-10-CM | POA: Diagnosis not present

## 2014-11-19 DIAGNOSIS — H35373 Puckering of macula, bilateral: Secondary | ICD-10-CM | POA: Diagnosis not present

## 2014-12-02 DIAGNOSIS — E559 Vitamin D deficiency, unspecified: Secondary | ICD-10-CM | POA: Diagnosis not present

## 2014-12-02 DIAGNOSIS — D351 Benign neoplasm of parathyroid gland: Secondary | ICD-10-CM | POA: Diagnosis not present

## 2014-12-02 DIAGNOSIS — M542 Cervicalgia: Secondary | ICD-10-CM | POA: Diagnosis not present

## 2014-12-02 DIAGNOSIS — I110 Hypertensive heart disease with heart failure: Secondary | ICD-10-CM | POA: Diagnosis not present

## 2014-12-02 DIAGNOSIS — E1122 Type 2 diabetes mellitus with diabetic chronic kidney disease: Secondary | ICD-10-CM | POA: Diagnosis not present

## 2014-12-02 DIAGNOSIS — N183 Chronic kidney disease, stage 3 (moderate): Secondary | ICD-10-CM | POA: Diagnosis not present

## 2015-02-04 DIAGNOSIS — R0602 Shortness of breath: Secondary | ICD-10-CM | POA: Diagnosis not present

## 2015-02-04 DIAGNOSIS — G4733 Obstructive sleep apnea (adult) (pediatric): Secondary | ICD-10-CM | POA: Diagnosis not present

## 2015-02-11 DIAGNOSIS — E1122 Type 2 diabetes mellitus with diabetic chronic kidney disease: Secondary | ICD-10-CM | POA: Diagnosis not present

## 2015-02-11 DIAGNOSIS — I110 Hypertensive heart disease with heart failure: Secondary | ICD-10-CM | POA: Diagnosis not present

## 2015-02-11 DIAGNOSIS — E782 Mixed hyperlipidemia: Secondary | ICD-10-CM | POA: Diagnosis not present

## 2015-02-11 DIAGNOSIS — M542 Cervicalgia: Secondary | ICD-10-CM | POA: Diagnosis not present

## 2015-02-11 DIAGNOSIS — M109 Gout, unspecified: Secondary | ICD-10-CM | POA: Diagnosis not present

## 2015-03-24 DIAGNOSIS — H35373 Puckering of macula, bilateral: Secondary | ICD-10-CM | POA: Diagnosis not present

## 2015-04-10 DIAGNOSIS — R809 Proteinuria, unspecified: Secondary | ICD-10-CM | POA: Diagnosis not present

## 2015-04-10 DIAGNOSIS — Z0001 Encounter for general adult medical examination with abnormal findings: Secondary | ICD-10-CM | POA: Diagnosis not present

## 2015-04-10 DIAGNOSIS — I10 Essential (primary) hypertension: Secondary | ICD-10-CM | POA: Diagnosis not present

## 2015-04-10 DIAGNOSIS — E1122 Type 2 diabetes mellitus with diabetic chronic kidney disease: Secondary | ICD-10-CM | POA: Diagnosis not present

## 2015-04-10 DIAGNOSIS — E559 Vitamin D deficiency, unspecified: Secondary | ICD-10-CM | POA: Diagnosis not present

## 2015-04-10 DIAGNOSIS — M109 Gout, unspecified: Secondary | ICD-10-CM | POA: Diagnosis not present

## 2015-04-10 DIAGNOSIS — E782 Mixed hyperlipidemia: Secondary | ICD-10-CM | POA: Diagnosis not present

## 2015-04-10 DIAGNOSIS — I110 Hypertensive heart disease with heart failure: Secondary | ICD-10-CM | POA: Diagnosis not present

## 2015-04-11 ENCOUNTER — Other Ambulatory Visit: Payer: Self-pay | Admitting: Internal Medicine

## 2015-04-11 DIAGNOSIS — Z1231 Encounter for screening mammogram for malignant neoplasm of breast: Secondary | ICD-10-CM

## 2015-04-21 MED ORDER — MOXIFLOXACIN HCL 0.5 % OP SOLN
OPHTHALMIC | Status: AC
  Filled 2015-04-21: qty 3

## 2015-04-21 MED ORDER — CYCLOPENTOLATE HCL 2 % OP SOLN
OPHTHALMIC | Status: AC
  Filled 2015-04-21: qty 2

## 2015-04-21 MED ORDER — PHENYLEPHRINE HCL 10 % OP SOLN
OPHTHALMIC | Status: AC
  Filled 2015-04-21: qty 5

## 2015-04-23 DIAGNOSIS — M25561 Pain in right knee: Secondary | ICD-10-CM | POA: Insufficient documentation

## 2015-05-20 ENCOUNTER — Encounter: Payer: Self-pay | Admitting: *Deleted

## 2015-05-21 ENCOUNTER — Ambulatory Visit: Payer: BC Managed Care – PPO | Admitting: Anesthesiology

## 2015-05-21 ENCOUNTER — Encounter
Admission: RE | Disposition: A | Discharge status: Home / Self Care | Payer: Self-pay | Source: Ambulatory Visit | Attending: Ophthalmology

## 2015-05-21 ENCOUNTER — Encounter: Payer: Self-pay | Admitting: *Deleted

## 2015-05-21 ENCOUNTER — Ambulatory Visit
Admission: RE | Admit: 2015-05-21 | Discharge: 2015-05-21 | Disposition: A | Discharge status: Home / Self Care | Payer: BC Managed Care – PPO | Source: Ambulatory Visit | Attending: Ophthalmology | Admitting: Ophthalmology

## 2015-05-21 DIAGNOSIS — R062 Wheezing: Secondary | ICD-10-CM | POA: Diagnosis not present

## 2015-05-21 DIAGNOSIS — M109 Gout, unspecified: Secondary | ICD-10-CM | POA: Diagnosis not present

## 2015-05-21 DIAGNOSIS — M7989 Other specified soft tissue disorders: Secondary | ICD-10-CM | POA: Insufficient documentation

## 2015-05-21 DIAGNOSIS — I10 Essential (primary) hypertension: Secondary | ICD-10-CM | POA: Diagnosis not present

## 2015-05-21 DIAGNOSIS — H43311 Vitreous membranes and strands, right eye: Secondary | ICD-10-CM | POA: Insufficient documentation

## 2015-05-21 DIAGNOSIS — G4730 Sleep apnea, unspecified: Secondary | ICD-10-CM | POA: Diagnosis not present

## 2015-05-21 DIAGNOSIS — H2511 Age-related nuclear cataract, right eye: Secondary | ICD-10-CM | POA: Insufficient documentation

## 2015-05-21 DIAGNOSIS — Z90710 Acquired absence of both cervix and uterus: Secondary | ICD-10-CM | POA: Diagnosis not present

## 2015-05-21 DIAGNOSIS — Z96659 Presence of unspecified artificial knee joint: Secondary | ICD-10-CM | POA: Insufficient documentation

## 2015-05-21 DIAGNOSIS — M1990 Unspecified osteoarthritis, unspecified site: Secondary | ICD-10-CM | POA: Diagnosis not present

## 2015-05-21 DIAGNOSIS — E119 Type 2 diabetes mellitus without complications: Secondary | ICD-10-CM | POA: Diagnosis not present

## 2015-05-21 DIAGNOSIS — I110 Hypertensive heart disease with heart failure: Secondary | ICD-10-CM | POA: Diagnosis not present

## 2015-05-21 DIAGNOSIS — H35373 Puckering of macula, bilateral: Secondary | ICD-10-CM | POA: Diagnosis not present

## 2015-05-21 DIAGNOSIS — I509 Heart failure, unspecified: Secondary | ICD-10-CM | POA: Diagnosis not present

## 2015-05-21 DIAGNOSIS — E669 Obesity, unspecified: Secondary | ICD-10-CM | POA: Diagnosis not present

## 2015-05-21 DIAGNOSIS — H35371 Puckering of macula, right eye: Secondary | ICD-10-CM | POA: Diagnosis not present

## 2015-05-21 DIAGNOSIS — H2513 Age-related nuclear cataract, bilateral: Secondary | ICD-10-CM | POA: Diagnosis not present

## 2015-05-21 HISTORY — DX: Type 2 diabetes mellitus without complications: E11.9

## 2015-05-21 HISTORY — DX: Sleep apnea, unspecified: G47.30

## 2015-05-21 HISTORY — DX: Reserved for inherently not codable concepts without codable children: IMO0001

## 2015-05-21 HISTORY — DX: Wheezing: R06.2

## 2015-05-21 HISTORY — DX: Unspecified osteoarthritis, unspecified site: M19.90

## 2015-05-21 HISTORY — DX: Gout, unspecified: M10.9

## 2015-05-21 HISTORY — DX: Bronchitis, not specified as acute or chronic: J40

## 2015-05-21 HISTORY — DX: Chronic kidney disease, unspecified: N18.9

## 2015-05-21 HISTORY — PX: CATARACT EXTRACTION W/PHACO: SHX586

## 2015-05-21 HISTORY — PX: PARS PLANA VITRECTOMY: SHX2166

## 2015-05-21 HISTORY — DX: Allergy status to unspecified drugs, medicaments and biological substances: Z88.9

## 2015-05-21 HISTORY — DX: Heart failure, unspecified: I50.9

## 2015-05-21 HISTORY — DX: Other specified soft tissue disorders: M79.89

## 2015-05-21 HISTORY — DX: Essential (primary) hypertension: I10

## 2015-05-21 LAB — GLUCOSE, CAPILLARY
GLUCOSE-CAPILLARY: 167 mg/dL — AB (ref 65–99)
Glucose-Capillary: 178 mg/dL — ABNORMAL HIGH (ref 65–99)

## 2015-05-21 SURGERY — PARS PLANA VITRECTOMY WITH 25 GAUGE
Anesthesia: Monitor Anesthesia Care | Site: Eye | Laterality: Right | Wound class: Clean

## 2015-05-21 MED ORDER — NEOMYCIN-POLYMYXIN-DEXAMETH 3.5-10000-0.1 OP OINT
TOPICAL_OINTMENT | OPHTHALMIC | Status: AC
  Filled 2015-05-21: qty 3.5

## 2015-05-21 MED ORDER — INDOCYANINE GREEN 25 MG IV SOLR
25.0000 mg | INTRAVENOUS | Status: DC | PRN
  Administered 2015-05-21: 25 mg via INTRAVENOUS

## 2015-05-21 MED ORDER — MIDAZOLAM HCL 5 MG/5ML IJ SOLN
INTRAMUSCULAR | Status: DC | PRN
  Administered 2015-05-21: 1 mg via INTRAVENOUS

## 2015-05-21 MED ORDER — TETRACAINE HCL 0.5 % OP SOLN
OPHTHALMIC | Status: DC | PRN
  Administered 2015-05-21: 1 [drp] via OPHTHALMIC

## 2015-05-21 MED ORDER — PHENYLEPHRINE HCL 10 % OP SOLN
1.0000 [drp] | OPHTHALMIC | Status: AC
  Administered 2015-05-21 (×3): 1 [drp] via OPHTHALMIC

## 2015-05-21 MED ORDER — SODIUM CHLORIDE 0.9 % IV SOLN
INTRAVENOUS | Status: DC
  Administered 2015-05-21: 08:00:00 via INTRAVENOUS

## 2015-05-21 MED ORDER — HYPROMELLOSE 0.3 % OP GEL
OPHTHALMIC | Status: AC
  Filled 2015-05-21: qty 3.5

## 2015-05-21 MED ORDER — DEXAMETHASONE SODIUM PHOSPHATE 10 MG/ML IJ SOLN
INTRAMUSCULAR | Status: AC
  Filled 2015-05-21: qty 1

## 2015-05-21 MED ORDER — TRIAMCINOLONE ACETONIDE INTRAVITREAL INJECTION 4 MG/0.1 ML
INTRAOCULAR | Status: DC | PRN
  Administered 2015-05-21: 4 mg via INTRAVITREAL

## 2015-05-21 MED ORDER — LIDOCAINE HCL (PF) 4 % IJ SOLN
INTRAOCULAR | Status: DC | PRN
  Administered 2015-05-21: 4 mL via OPHTHALMIC

## 2015-05-21 MED ORDER — ONDANSETRON HCL 4 MG/2ML IJ SOLN: 4.0000 mg | Freq: Once | INTRAMUSCULAR | Status: DC | PRN

## 2015-05-21 MED ORDER — HYALURONIDASE HUMAN 150 UNIT/ML IJ SOLN
INTRAMUSCULAR | Status: AC
Start: 2015-05-21 — End: 2015-05-21
  Filled 2015-05-21: qty 1

## 2015-05-21 MED ORDER — BSS PLUS IO SOLN
INTRAOCULAR | Status: DC | PRN
  Administered 2015-05-21: 1 via INTRAOCULAR

## 2015-05-21 MED ORDER — BUPIVACAINE HCL (PF) 0.75 % IJ SOLN
INTRAMUSCULAR | Status: AC
  Filled 2015-05-21: qty 10

## 2015-05-21 MED ORDER — CEFUROXIME OPHTHALMIC INJECTION 1 MG/0.1 ML
INJECTION | OPHTHALMIC | Status: DC | PRN
  Administered 2015-05-21: 1 mg via INTRACAMERAL

## 2015-05-21 MED ORDER — TRIAMCINOLONE ACETONIDE 40 MG/ML IJ SUSP
INTRAMUSCULAR | Status: AC
  Filled 2015-05-21: qty 1

## 2015-05-21 MED ORDER — BSS PLUS IO SOLN
Freq: Once | INTRAOCULAR | Status: DC
  Filled 2015-05-21: qty 500

## 2015-05-21 MED ORDER — TETRACAINE HCL 0.5 % OP SOLN
OPHTHALMIC | Status: AC
  Filled 2015-05-21: qty 2

## 2015-05-21 MED ORDER — EPINEPHRINE HCL 1 MG/ML IJ SOLN
INTRAMUSCULAR | Status: AC
  Filled 2015-05-21: qty 1

## 2015-05-21 MED ORDER — LIDOCAINE HCL (PF) 4 % IJ SOLN
INTRAMUSCULAR | Status: AC
  Filled 2015-05-21: qty 5

## 2015-05-21 MED ORDER — DEXTROSE 50 % IV SOLN
INTRAVENOUS | Status: DC | PRN
  Administered 2015-05-21: 3 mL via INTRAVENOUS

## 2015-05-21 MED ORDER — PHENYLEPHRINE HCL 10 % OP SOLN
OPHTHALMIC | Status: AC
  Administered 2015-05-21: 1 [drp] via OPHTHALMIC
  Filled 2015-05-21: qty 5

## 2015-05-21 MED ORDER — ATROPINE SULFATE 1 % OP SOLN
OPHTHALMIC | Status: AC
  Filled 2015-05-21: qty 2

## 2015-05-21 MED ORDER — FENTANYL CITRATE (PF) 100 MCG/2ML IJ SOLN: 25.0000 ug | INTRAMUSCULAR | Status: DC | PRN

## 2015-05-21 MED ORDER — BUPIVACAINE HCL (PF) 0.75 % IJ SOLN
INTRAMUSCULAR | Status: DC | PRN
  Administered 2015-05-21: 4 mL via OPHTHALMIC

## 2015-05-21 MED ORDER — CEFUROXIME OPHTHALMIC INJECTION 1 MG/0.1 ML
INJECTION | OPHTHALMIC | Status: AC
  Filled 2015-05-21: qty 0.1

## 2015-05-21 MED ORDER — CARBACHOL 0.01 % IO SOLN: INTRAOCULAR | Status: DC | PRN

## 2015-05-21 MED ORDER — CYCLOPENTOLATE HCL 2 % OP SOLN
OPHTHALMIC | Status: AC
  Administered 2015-05-21: 1 [drp] via OPHTHALMIC
  Filled 2015-05-21: qty 2

## 2015-05-21 MED ORDER — CYCLOPENTOLATE HCL 2 % OP SOLN
1.0000 [drp] | OPHTHALMIC | Status: AC
  Administered 2015-05-21 (×3): 1 [drp] via OPHTHALMIC

## 2015-05-21 MED ORDER — INDOCYANINE GREEN 25 MG IV SOLR
25.0000 mg | Freq: Once | INTRAVENOUS | Status: DC
  Filled 2015-05-21: qty 25

## 2015-05-21 MED ORDER — ALFENTANIL 500 MCG/ML IJ INJ
INJECTION | INTRAMUSCULAR | Status: DC | PRN
  Administered 2015-05-21: 500 ug via INTRAVENOUS

## 2015-05-21 MED ORDER — ATROPINE SULFATE 1 % OP SOLN
OPHTHALMIC | Status: DC | PRN
  Administered 2015-05-21: 2 [drp] via OPHTHALMIC

## 2015-05-21 MED ORDER — NA HYALUR & NA CHOND-NA HYALUR 0.4-0.35 ML IO KIT
PACK | INTRAOCULAR | Status: DC | PRN
  Administered 2015-05-21: 1.5 mL via INTRAOCULAR

## 2015-05-21 MED ORDER — DEXTROSE 50 % IV SOLN
INTRAVENOUS | Status: AC
  Filled 2015-05-21: qty 50

## 2015-05-21 SURGICAL SUPPLY — 49 items
APPLICATOR COTTON TIP 6IN STRL (MISCELLANEOUS) ×8 IMPLANT
CANNULA ANT/CHMB 27G (MISCELLANEOUS) ×1 IMPLANT
CANNULA ANT/CHMB 27GA (MISCELLANEOUS) ×2 IMPLANT
CANNULA SOFT TIP 25G (CANNULA) IMPLANT
CORD BIP STRL DISP 12FT (MISCELLANEOUS) IMPLANT
CUP MEDICINE 2OZ PLAST GRAD ST (MISCELLANEOUS) ×2 IMPLANT
ERASER HMR WETFIELD 25G (MISCELLANEOUS) IMPLANT
FORCEPS GRIESH GRASP 25G (INSTRUMENTS) IMPLANT
FORCEPS GRIESH ILM PLUS 25G (INSTRUMENTS) IMPLANT
GLOVE BIO SURGEON STRL SZ8 (GLOVE) ×2 IMPLANT
GLOVE BIOGEL M 6.5 STRL (GLOVE) ×2 IMPLANT
GLOVE SURG LX 6.5 MICRO (GLOVE) ×1
GLOVE SURG LX 7.5 STRW (GLOVE) ×1
GLOVE SURG LX STRL 6.5 MICRO (GLOVE) ×1 IMPLANT
GLOVE SURG LX STRL 7.5 STRW (GLOVE) ×1 IMPLANT
GLOVE SURG PR MICRO ENCORE 7 (GLOVE) ×2 IMPLANT
GOWN STRL REUS W/ TWL LRG LVL3 (GOWN DISPOSABLE) ×2 IMPLANT
GOWN STRL REUS W/TWL LRG LVL3 (GOWN DISPOSABLE) ×4
LENS BIOM OPTIC SET 200MM DISP (MISCELLANEOUS) ×2 IMPLANT
LENS IOL ACRSF IQ TRC 3 23.5 (Intraocular Lens) IMPLANT
LENS IOL ACRYSOF IQ TORIC 23.5 (Intraocular Lens) ×2 IMPLANT
LENS IOL IQ TORIC 3 23.5 (Intraocular Lens) ×1 IMPLANT
LENS VITRECTOMY FLAT DISP (MISCELLANEOUS) IMPLANT
NDL FILTER BLUNT 18X1 1/2 (NEEDLE) ×1 IMPLANT
NDL HYPO 25GX1X1/2 BEV (NEEDLE) IMPLANT
NDL RETROBULBAR .5 NSTRL (NEEDLE) ×2 IMPLANT
NDL SAFETY ECLIPSE 18X1.5 (NEEDLE) IMPLANT
NEEDLE FILTER BLUNT 18X 1/2SAF (NEEDLE) ×1
NEEDLE FILTER BLUNT 18X1 1/2 (NEEDLE) ×1 IMPLANT
NEEDLE HYPO 18GX1.5 SHARP (NEEDLE) ×2
NEEDLE HYPO 25GX1X1/2 BEV (NEEDLE) ×2 IMPLANT
PACK CATARACT (MISCELLANEOUS) ×2 IMPLANT
PACK CATARACT BRASINGTON LX (MISCELLANEOUS) ×2 IMPLANT
PACK EYE AFTER SURG (MISCELLANEOUS) ×2 IMPLANT
PACK VITRECTOMY (MISCELLANEOUS) ×2 IMPLANT
PACK VITRECTOMY CASSETTE 25GA (MISCELLANEOUS) ×3 IMPLANT
PROBE DIRECTIONAL LASER (MISCELLANEOUS) IMPLANT
PROBE LASER ILLUM FLEX CVD 25G (OPHTHALMIC) ×1 IMPLANT
SOL BSS BAG (MISCELLANEOUS) ×2
SOL PREP PVP 2OZ (MISCELLANEOUS) ×2
SOLUTION BSS BAG (MISCELLANEOUS) ×1 IMPLANT
SOLUTION PREP PVP 2OZ (MISCELLANEOUS) ×1 IMPLANT
STRAP SAFETY BODY (MISCELLANEOUS) ×2 IMPLANT
SYR 3ML LL SCALE MARK (SYRINGE) ×3 IMPLANT
SYR 5ML LL (SYRINGE) ×2 IMPLANT
SYR TB 1ML 27GX1/2 LL (SYRINGE) ×2 IMPLANT
SYRINGE 10CC LL (SYRINGE) ×2 IMPLANT
WATER STERILE IRR 1000ML POUR (IV SOLUTION) ×2 IMPLANT
WIPE NON LINTING 3.25X3.25 (MISCELLANEOUS) ×2 IMPLANT

## 2015-05-21 NOTE — Transfer of Care (Signed)
Immediate Anesthesia Transfer of Care Note  Patient: Frances Greene  Procedure(s) Performed: Procedure(s) with comments: PARS PLANA VITRECTOMY WITH 25 GAUGE (Right) - Lot # [id number] H Endo laser: Watts: 300 Pulse Duration: 200 Total Pulses:143  CATARACT EXTRACTION PHACO AND INTRAOCULAR LENS PLACEMENT (IOC) (Right) - [place]: AP%: 10.0 CDE: 9.53  Patient Location: PACU  Anesthesia Type:MAC  Level of Consciousness: awake, alert , oriented and patient cooperative  Airway & Oxygen Therapy: Patient Spontanous Breathing  Post-op Assessment: Report given to RN, Post -op Vital signs reviewed and stable and Patient moving all extremities X 4  Post vital signs: Reviewed and stable  Last Vitals:  Filed Vitals:   05/21/15 0900 05/21/15 0913  BP: 146/72 146/72  Pulse: 62 62  Temp: 36.6 C 36.6 C  Resp: 20 20    Last Pain:  Filed Vitals:   05/21/15 0914  PainSc: 0-No pain         Complications: No apparent anesthesia complications

## 2015-05-21 NOTE — H&P (Signed)
.  Previous H&P scanned in reviewed, patient examined, and no interval changes.  Please see scanned record for complete information.   

## 2015-05-21 NOTE — Op Note (Signed)
.  PREOPERATIVE DIAGNOSIS: Epiretinal membrane, cataract, right eye.            POST OPERATIVE DIAGNOSIS: Epiretinal membrane, cataract, right eye.          OPERATION PERFORMED: 25-gauge pars plana vitrectomy with triamcinolone, ICG, membrane peel, endolaser, air-fluid exchange, and partial air, right eye.  Phaco/pciol w/ Dr. Frederik Pear                  ANESTHESIA: MAC with retrobulbar block.   COMPLICATIONS: None.     BLOOD LOSS: Minimal.   DESCRIPTION OF PROCEDURE: The patient was evaluated in the clinic for a visually significant epiretinal membrane in the right eye.  The risks, benefits, alternatives, and complications were discussed with the patient, and patient elected to proceed with pars plana vitrectomy with membrane peeling.  The patient also had a significant cataract in the right eye.  She had significant astimatism and so she decided on toric IOL with Dr. Frederik Pear on the day of PPV.  On the day of surgery, the patient was greeted in the preoperative holding area.  The right eye was marked.  The patient was then brought into the operating room and placed under monitored anesthesia care.  Next, 5 ml of a retrobulbar block consisting of 4% xylocaine plain, 0.75% sensorcaine plain and hylenex 100u was injected. Three 25g trocars were placed in the usual position. The infusion cannula was checked prior to starting the infusion to ensure it was in the correct position.   Dr. Frederik Pear then performed a phaco/pciol with toric IOL in the right eye. Please see his operative note for details.  Initially, the cutter was not aspirating correctly.  A new cutter was attached to the machine which was fully functional. A core vitrectomy was performed. The patient did not have a posterior hyaloid detachment. Triamcinolone was used to stain the posterior hyaloid and a PVD was induced using the cutter. The posterior vitreous detachment was continued into the periphery and the vitreous was trimmed for 360  degrees to the periphery.   ICG was then applied to the retinal surface.  ILM forceps were used to peel the epiretinal membrane from the macular surface. ICG was then used to re-stain the macula and forceps were used to peel the  internal limiting membrane for an area of less than one disc diameter around the fovea. The ILM was very adherent and patient cooperation began to wane.  Attention was then drawn to the periphery.   A 360 degree scleral depressed exam was performed.  There were no retinal tears or breaks. A small hemorrhage nasally was noted, and so decision was made to laser that area and sub trocar retina temporally due to possible traction at the beginning of the case. A partial air-fluid exchange was then performed and a partial air bubble was left in the vitreous cavity.  The trocars were then removed and were airtight.  The eye had an acceptable pressure by palpation.  The wounds were airtight.  Subconjunctival triamcinolone, Dexamethasone and cefuroxime was injected.  The eye was then patched and shielded with Neo-Poly-Dex ointment and the patient was taken to the recovery area in stable condition.

## 2015-05-21 NOTE — Anesthesia Preprocedure Evaluation (Addendum)
Anesthesia Evaluation  Patient identified by MRN, date of birth, ID band Patient awake    Reviewed: Allergy & Precautions, NPO status , Patient's Chart, lab work & pertinent test results, reviewed documented beta blocker date and time   Airway Mallampati: III  TM Distance: >3 FB     Dental  (+) Upper Dentures, Lower Dentures   Pulmonary shortness of breath, sleep apnea and Continuous Positive Airway Pressure Ventilation ,           Cardiovascular hypertension, Pt. on medications and Pt. on home beta blockers +CHF       Neuro/Psych    GI/Hepatic   Endo/Other  diabetes  Renal/GU Renal InsufficiencyRenal disease     Musculoskeletal  (+) Arthritis ,   Abdominal   Peds  Hematology   Anesthesia Other Findings Obese. Will use CPAP tonite.  Reproductive/Obstetrics                            Anesthesia Physical Anesthesia Plan  ASA: III  Anesthesia Plan: MAC   Post-op Pain Management:    Induction:   Airway Management Planned:   Additional Equipment:   Intra-op Plan:   Post-operative Plan:   Informed Consent: I have reviewed the patients History and Physical, chart, labs and discussed the procedure including the risks, benefits and alternatives for the proposed anesthesia with the patient or authorized representative who has indicated his/her understanding and acceptance.     Plan Discussed with: CRNA  Anesthesia Plan Comments:         Anesthesia Quick Evaluation

## 2015-05-21 NOTE — Anesthesia Postprocedure Evaluation (Signed)
Anesthesia Post Note  Patient: Frances Greene  Procedure(s) Performed: Procedure(s) (LRB): PARS PLANA VITRECTOMY WITH 25 GAUGE (Right) CATARACT EXTRACTION PHACO AND INTRAOCULAR LENS PLACEMENT (IOC) (Right)  Patient location during evaluation: PACU Anesthesia Type: General Level of consciousness: awake and alert Pain management: pain level controlled Vital Signs Assessment: post-procedure vital signs reviewed and stable Respiratory status: spontaneous breathing, nonlabored ventilation, respiratory function stable and patient connected to nasal cannula oxygen Cardiovascular status: blood pressure returned to baseline and stable Postop Assessment: no signs of nausea or vomiting Anesthetic complications: no    Last Vitals:  Filed Vitals:   05/21/15 0936 05/21/15 0957  BP: 161/54 158/81  Pulse: 59 58  Temp: 36.6 C   Resp: 16 18    Last Pain:  Filed Vitals:   05/21/15 0957  PainSc: 0-No pain                 Mitzie Marlar S

## 2015-05-21 NOTE — H&P (Signed)
  The History and Physical notes are on paper, have been signed, and are to be scanned. The patient remains stable and unchanged from the H&P.   Previous H&P reviewed, patient examined, and there are no changes.  Frances Greene 05/21/2015 7:15 AM

## 2015-05-21 NOTE — Op Note (Signed)
OPERATIVE NOTE  Frances Greene [id number] 05/21/2015   PREOPERATIVE DIAGNOSIS:  Nuclear Sclerotic Cataract Right Eye H25.11   POSTOPERATIVE DIAGNOSIS: Nuclear Sclerotic Cataract Right Eye H25.11          PROCEDURE:  Phacoemusification with posterior chamber Toric intraocular lens placement of the right eye  LENS:     [id number] 23.5 Toric intraocular lens with 1.5 diopters of cylindrical power with axis orientation at 5 degrees.    ULTRASOUND TIME:  of  minutes  seconds, CDE  Not recorded  SURGEON:  Wyonia Hough, MD   ANESTHESIA:  Retrobulbar block of Xylocaine and Bupivacaine and Hyaluronidase   COMPLICATIONS:  None.   DESCRIPTION OF PROCEDURE:  The patient was identified in the holding room and transported to the operating suite and placed in the supine position. The right eye was identified as the operative eye, and a retrobulbar block was administered under inravenous sedation.  It was then prepped and draped in the usual sterile ophthalmic fashion.    A 1 millimeter clear-corneal paracentesis was made at the 12:00 position.  The anterior chamber was filled with Viscoat viscoelastic.  A 2.6 millimeter keratome was used to make a near-clear corneal incision at the 9:00 position. A cystotome and capsulorrhexis forceps were then used to make a curvilinear capsulorrhexis.  Hydrodissection and hydrodelineation were then performed using balanced salt solution.   Phacoemulsification was then used in stop and chop fashion to remove the lens, nucleus and epinucleus.  The remaining cortex was aspirated using the irrigation and aspiration handpiece.  Provisc viscoelastic was then placed into the capsular bag to distend it for lens placement.     A Toric lens was then injected into the capsular bag.  The Verion digital marker was used to align the implant at the intended axis.  It was rotated clockwise until the axis marks on the lens were approximately 15 degrees in the  counterclockwise direction to the intended alignment.  The viscoelastic was aspirated from the eye using the irrigation aspiration handpiece.  Then, a Koch spatula through the sideport incision was used to rotate the lens in a clockwise direction until the axis markings of the intraocular lens were lined up with the Verion alignment.  The wounds were hydrated with balanced salt solution.  The eye was noted to have a physiologic pressure without wound leak.   Cefuroxime 0.1 ml of a 10mg /ml solution was injected into the anterior chamber for a dose of 1 mg of intracameral antibiotic at the completion of the case.  The patient then underwent a posterior vitrectomy by Dr. Oval Linsey in its entirety, and is dictated separately. [name] 05/21/2015, 8:12 AM

## 2015-05-21 NOTE — Discharge Instructions (Signed)
DO NOT LIE ON YOUR BACK. TYLENOL EVERY 3-4 HOURS AS NEEDED FOR PAIN. LEAVE THE EYE PATCH ON UNTIL SEEN BY DR. Marylene Buerger.  General Anesthesia, Adult, Care After Refer to this sheet in the next few weeks. These instructions provide you with information on caring for yourself after your procedure. Your health care provider may also give you more specific instructions. Your treatment has been planned according to current medical practices, but problems sometimes occur. Call your health care provider if you have any problems or questions after your procedure. WHAT TO EXPECT AFTER THE PROCEDURE After the procedure, it is typical to experience:  Sleepiness.  Nausea and vomiting. HOME CARE INSTRUCTIONS  For the first 24 hours after general anesthesia:  Have a responsible person with you.  Do not drive a car. If you are alone, do not take public transportation.  Do not drink alcohol.  Do not take medicine that has not been prescribed by your health care provider.  Do not sign important papers or make important decisions.  You may resume a normal diet and activities as directed by your health care provider.  Change bandages (dressings) as directed.  If you have questions or problems that seem related to general anesthesia, call the hospital and ask for the anesthetist or anesthesiologist on call. SEEK MEDICAL CARE IF:  You have nausea and vomiting that continue the day after anesthesia.  You develop a rash. SEEK IMMEDIATE MEDICAL CARE IF:   You have difficulty breathing.  You have chest pain.  You have any allergic problems.   This information is not intended to replace advice given to you by your health care provider. Make sure you discuss any questions you have with your health care provider.   Document Released: 04/12/2000 Document Revised: 01/25/2014 Document Reviewed: 05/05/2011 Elsevier Interactive Patient Education 2016 [place].  Vitrectomy The vitreous body is a  sticky gel-like substance that fills most of the inside of the eyeball and gives it shape. The vitreous is clear and must remain clear in order for light to pass through it and reach the retina. A vitrectomy is a surgery to remove a portion of the vitreous fluid. It may need to be performed if:  Anything happens to the vitreous in one or both of your eyes that makes the fluid cloudy.  The vitreous is pulling on the structures it touches and risks tearing them. LET YOUR CAREGIVER KNOW ABOUT:   Allergies to food or medicine.  Medicines taken, including prescription medicines (especially blood thinners), vitamins, herbs, eyedrops, over-the-counter medicines, and creams.  Use of steroids (by mouth or as creams).  History of bleeding problems and blot clots.  Previous surgery.  Other health problems, including diabetes and kidney problems.  Possibility of pregnancy, if this applies.  If you have a fear of being in an enclosed or narrow space (claustrophobia).  If you smoke, drink alcohol, or use drugs. RISKS AND COMPLICATIONS Any surgery on the vitreous is serious and carries risks that may affect your ability to see with the eye being operated on. The most serious complications include:  Infection or inflammation inside the eyeball after surgery. In very rare cases, inflammation may affect both eyes, including the eye that did not have surgery.  Bleeding in the vitreous that can cause limitation of vision or blindness.  Seeing small drifting specks in your field of vision (floaters).  Retinal tears or detachment. BEFORE THE PROCEDURE When emergency surgery is not required, you will have time to prepare  for the procedure. The following are things you may need to do before the procedure.  Ask your caregiver about changing or stopping your regular medicines. You may need to stop taking blood thinning medicines or aspirin.  If you are given eyedrops, use them as directed to prevent  infection and to dilate your pupil. The drops may make your vision blurry and may make your eyes sensitive to light.  You may need to remain in a certain position if you have bleeding in your eye or if you are at risk of a retinal detachment.  Do not eat or drink anything except small amounts of water for 8 to 12 hours before your surgery, or as directed by your caregiver.  Arrange for someone to drive you home after surgery. PROCEDURE   You will not have pain during the surgery, even if you are awake.  A very small instrument is used to cut the vitreous body if it is pulling on another structure in the eye. The same instrument is used to suction the fluid out of the eye if there is something in the fluid that is making it cloudy (like blood).  A laser or freezing probe may be used to remove scar tissue or to fix retinal tears.  Air, gases, or oil may be injected into the eye to make up for the volume of the vitreous removed. AFTER THE PROCEDURE  You may be allowed to go home right after surgery, or you may need to stay overnight.  You will be given eyedrops to use and possibly some medicine for pain or discomfort.  Have someone drive you home. Do not drive until your caregiver says it is okay.  You may be asked to stay in a specific position for a period of time instead of being able to walk around.  You may see a large dark area in your field of vision. This is caused by the air, gases, or oil injected into your eye. This vision problem will go away with time.  You may have extremely blurry vision in the eye that was operated on.  Your pupil may be dilated. You may have light sensitivity and have trouble seeing things up close. Wear sunglasses outside to make it more comfortable.   This information is not intended to replace advice given to you by your health care provider. Make sure you discuss any questions you have with your health care provider.   Document Released: 09/16/2010  Document Revised: 03/29/2011 Document Reviewed: 09/16/2010 Elsevier Interactive Patient Education [employer].

## 2015-06-30 ENCOUNTER — Other Ambulatory Visit: Payer: Self-pay | Admitting: Internal Medicine

## 2015-06-30 ENCOUNTER — Ambulatory Visit
Admission: RE | Admit: 2015-06-30 | Discharge: 2015-06-30 | Disposition: A | Discharge status: Home / Self Care | Payer: BC Managed Care – PPO | Source: Ambulatory Visit | Attending: Internal Medicine | Admitting: Internal Medicine

## 2015-06-30 DIAGNOSIS — Z1231 Encounter for screening mammogram for malignant neoplasm of breast: Secondary | ICD-10-CM

## 2015-07-02 DIAGNOSIS — I509 Heart failure, unspecified: Secondary | ICD-10-CM | POA: Diagnosis not present

## 2015-07-08 DIAGNOSIS — M25532 Pain in left wrist: Secondary | ICD-10-CM | POA: Diagnosis not present

## 2015-07-29 DIAGNOSIS — N183 Chronic kidney disease, stage 3 (moderate): Secondary | ICD-10-CM | POA: Diagnosis not present

## 2015-07-29 DIAGNOSIS — I110 Hypertensive heart disease with heart failure: Secondary | ICD-10-CM | POA: Diagnosis not present

## 2015-07-29 DIAGNOSIS — D631 Anemia in chronic kidney disease: Secondary | ICD-10-CM | POA: Diagnosis not present

## 2015-07-29 DIAGNOSIS — E1122 Type 2 diabetes mellitus with diabetic chronic kidney disease: Secondary | ICD-10-CM | POA: Diagnosis not present

## 2015-08-14 DIAGNOSIS — N183 Chronic kidney disease, stage 3 (moderate): Secondary | ICD-10-CM | POA: Diagnosis not present

## 2015-08-14 DIAGNOSIS — G4733 Obstructive sleep apnea (adult) (pediatric): Secondary | ICD-10-CM | POA: Diagnosis not present

## 2015-08-14 DIAGNOSIS — R0602 Shortness of breath: Secondary | ICD-10-CM | POA: Diagnosis not present

## 2015-08-14 DIAGNOSIS — R609 Edema, unspecified: Secondary | ICD-10-CM | POA: Diagnosis not present

## 2015-08-20 DIAGNOSIS — G4733 Obstructive sleep apnea (adult) (pediatric): Secondary | ICD-10-CM | POA: Diagnosis not present

## 2015-09-03 DIAGNOSIS — R0602 Shortness of breath: Secondary | ICD-10-CM | POA: Diagnosis not present

## 2015-09-03 DIAGNOSIS — H903 Sensorineural hearing loss, bilateral: Secondary | ICD-10-CM | POA: Diagnosis not present

## 2015-09-03 DIAGNOSIS — R430 Anosmia: Secondary | ICD-10-CM | POA: Diagnosis not present

## 2015-09-03 DIAGNOSIS — J301 Allergic rhinitis due to pollen: Secondary | ICD-10-CM | POA: Diagnosis not present

## 2015-09-08 DIAGNOSIS — H2000 Unspecified acute and subacute iridocyclitis: Secondary | ICD-10-CM | POA: Diagnosis not present

## 2015-09-15 DIAGNOSIS — G4733 Obstructive sleep apnea (adult) (pediatric): Secondary | ICD-10-CM | POA: Diagnosis not present

## 2015-09-15 DIAGNOSIS — R0602 Shortness of breath: Secondary | ICD-10-CM | POA: Diagnosis not present

## 2015-09-15 DIAGNOSIS — R430 Anosmia: Secondary | ICD-10-CM | POA: Diagnosis not present

## 2015-09-15 DIAGNOSIS — I5022 Chronic systolic (congestive) heart failure: Secondary | ICD-10-CM | POA: Diagnosis not present

## 2015-09-23 ENCOUNTER — Other Ambulatory Visit: Payer: Self-pay

## 2015-10-27 DIAGNOSIS — H35371 Puckering of macula, right eye: Secondary | ICD-10-CM | POA: Diagnosis not present

## 2015-11-27 DIAGNOSIS — Z23 Encounter for immunization: Secondary | ICD-10-CM | POA: Diagnosis not present

## 2015-11-27 DIAGNOSIS — E1122 Type 2 diabetes mellitus with diabetic chronic kidney disease: Secondary | ICD-10-CM | POA: Diagnosis not present

## 2015-11-27 DIAGNOSIS — D631 Anemia in chronic kidney disease: Secondary | ICD-10-CM | POA: Diagnosis not present

## 2015-11-27 DIAGNOSIS — I110 Hypertensive heart disease with heart failure: Secondary | ICD-10-CM | POA: Diagnosis not present

## 2015-11-27 DIAGNOSIS — E782 Mixed hyperlipidemia: Secondary | ICD-10-CM | POA: Diagnosis not present

## 2015-12-01 DIAGNOSIS — H401112 Primary open-angle glaucoma, right eye, moderate stage: Secondary | ICD-10-CM | POA: Diagnosis not present

## 2015-12-08 DIAGNOSIS — D751 Secondary polycythemia: Secondary | ICD-10-CM | POA: Diagnosis not present

## 2015-12-08 DIAGNOSIS — E782 Mixed hyperlipidemia: Secondary | ICD-10-CM | POA: Diagnosis not present

## 2015-12-08 DIAGNOSIS — N183 Chronic kidney disease, stage 3 (moderate): Secondary | ICD-10-CM | POA: Diagnosis not present

## 2015-12-08 DIAGNOSIS — I10 Essential (primary) hypertension: Secondary | ICD-10-CM | POA: Diagnosis not present

## 2015-12-08 DIAGNOSIS — E1165 Type 2 diabetes mellitus with hyperglycemia: Secondary | ICD-10-CM | POA: Diagnosis not present

## 2015-12-08 DIAGNOSIS — G4733 Obstructive sleep apnea (adult) (pediatric): Secondary | ICD-10-CM | POA: Diagnosis not present

## 2015-12-09 DIAGNOSIS — G4733 Obstructive sleep apnea (adult) (pediatric): Secondary | ICD-10-CM | POA: Diagnosis not present

## 2015-12-09 DIAGNOSIS — H2000 Unspecified acute and subacute iridocyclitis: Secondary | ICD-10-CM | POA: Diagnosis not present

## 2015-12-19 DIAGNOSIS — E1122 Type 2 diabetes mellitus with diabetic chronic kidney disease: Secondary | ICD-10-CM | POA: Diagnosis not present

## 2015-12-19 DIAGNOSIS — N183 Chronic kidney disease, stage 3 (moderate): Secondary | ICD-10-CM | POA: Diagnosis not present

## 2015-12-19 DIAGNOSIS — D631 Anemia in chronic kidney disease: Secondary | ICD-10-CM | POA: Diagnosis not present

## 2015-12-19 DIAGNOSIS — I10 Essential (primary) hypertension: Secondary | ICD-10-CM | POA: Diagnosis not present

## 2015-12-29 DIAGNOSIS — N183 Chronic kidney disease, stage 3 (moderate): Secondary | ICD-10-CM | POA: Diagnosis not present

## 2015-12-29 DIAGNOSIS — R0602 Shortness of breath: Secondary | ICD-10-CM | POA: Diagnosis not present

## 2015-12-29 DIAGNOSIS — G4733 Obstructive sleep apnea (adult) (pediatric): Secondary | ICD-10-CM | POA: Diagnosis not present

## 2016-01-01 DIAGNOSIS — N183 Chronic kidney disease, stage 3 (moderate): Secondary | ICD-10-CM | POA: Diagnosis not present

## 2016-01-01 DIAGNOSIS — I10 Essential (primary) hypertension: Secondary | ICD-10-CM | POA: Diagnosis not present

## 2016-01-14 DIAGNOSIS — D509 Iron deficiency anemia, unspecified: Secondary | ICD-10-CM | POA: Insufficient documentation

## 2016-01-14 NOTE — Progress Notes (Deleted)
Frances Greene  [phone]) [phone] Fax:([phone]) [phone]  ID: [id number] PEPPERMAN OB: 08-May-1945  MR#: [id number]  W[facility]#:[id number]  Patient Care Team: Lavera Guise, MD as PCP - General (Internal Medicine)  CHIEF COMPLAINT: Iron deficiency anemia.  INTERVAL HISTORY: ***  REVIEW OF SYSTEMS:   ROS  As per HPI. Otherwise, a complete review of systems is negative.  PAST MEDICAL HISTORY: Past Medical History:  Diagnosis Date  . Arthritis   . Bronchitis   . CHF (congestive heart failure) ([place])   . Chronic kidney disease    decreased kidney function  . Diabetes mellitus without complication ([place])   . Gout   . Hypertension   . Multiple allergies   . Shortness of breath dyspnea    w/ exertion  . Sleep apnea    CPAP  . Swelling of both lower extremities   . Wheezing     PAST SURGICAL HISTORY: Past Surgical History:  Procedure Laterality Date  . ABDOMINAL HYSTERECTOMY    . BREAST BIOPSY Left 1989   neg  . CATARACT EXTRACTION W/PHACO Right 05/21/2015   Procedure: CATARACT EXTRACTION PHACO AND INTRAOCULAR LENS PLACEMENT (IOC);  Surgeon: Milus Height, MD;  Location: [facility] ORS;  Service: Ophthalmology;  Laterality: Right;  [place]: AP%: 10.0 CDE: 9.53  . COLONOSCOPY    . JOINT REPLACEMENT Bilateral    knee  . PARS PLANA VITRECTOMY Right 05/21/2015   Procedure: PARS PLANA VITRECTOMY WITH 25 GAUGE;  Surgeon: Milus Height, MD;  Location: [facility] ORS;  Service: Ophthalmology;  Laterality: Right;  Lot # [id number] H Endo laser: Watts: 300 Pulse Duration: 200 Total Pulses:143   . shoulder surgery      FAMILY HISTORY: Family History  Problem Relation Age of Onset  . Breast cancer Cousin   . Breast cancer Other     ADVANCED DIRECTIVES (Y/N):  N  HEALTH MAINTENANCE: Social History  Substance Use Topics  . Smoking status: Never Smoker  . Smokeless tobacco: Never Used  . Alcohol use No     Colonoscopy:  PAP:  Bone density:  Lipid panel:  Allergies    Allergen Reactions  . Altace [Ramipril] Cough    Current Outpatient Prescriptions  Medication Sig Dispense Refill  . aspirin 325 MG tablet Take 325 mg by mouth daily.    Marland Kitchen atorvastatin (LIPITOR) 20 MG tablet Take 20 mg by mouth at bedtime.    . bisoprolol-hydrochlorothiazide (ZIAC) 2.5-6.25 MG tablet Take 1 tablet by mouth 2 (two) times daily.    . ergocalciferol (VITAMIN D2) 50000 units capsule Take 50,000 Units by mouth once a week.    . febuxostat (ULORIC) 40 MG tablet Take 40 mg by mouth daily.    . folic acid (FOLVITE) 353 MCG tablet Take 800 mcg by mouth daily.    . furosemide (LASIX) 40 MG tablet Take 40 mg by mouth daily.    Marland Kitchen GARLIC PO Take 6,144 mg by mouth daily.    Marland Kitchen glimepiride (AMARYL) 1 MG tablet Take 1 mg by mouth every evening.    . hydrALAZINE (APRESOLINE) 10 MG tablet Take 10 mg by mouth 3 (three) times daily.    . IRON PO Take by mouth 2 (two) times daily.    Marland Kitchen losartan (COZAAR) 25 MG tablet Take 12.5 mg by mouth 2 (two) times daily.    . meloxicam (MOBIC) 7.5 MG tablet Take 7.5 mg by mouth daily as needed for pain.    . montelukast (SINGULAIR) 10 MG tablet Take 10 mg by mouth  at bedtime.    . Omega-3 Fatty Acids (FISH OIL PO) Take by mouth daily.    . risedronate (ACTONEL) 35 MG tablet Take 35 mg by mouth every 7 (seven) days. with water on empty stomach, nothing by mouth or lie down for next 30 minutes.     No current facility-administered medications for this visit.     OBJECTIVE: There were no vitals filed for this visit.   There is no height or weight on file to calculate BMI.    ECOG FS:{CHL ONC [id number]  General: Well-developed, well-nourished, no acute distress. Eyes: Pink conjunctiva, anicteric sclera. HEENT: Normocephalic, moist mucous membranes, clear oropharnyx. Lungs: Clear to auscultation bilaterally. Heart: Regular rate and rhythm. No rubs, murmurs, or gallops. Abdomen: Soft, nontender, nondistended. No organomegaly noted, normoactive bowel  sounds. Musculoskeletal: No edema, cyanosis, or clubbing. Neuro: Alert, answering all questions appropriately. Cranial nerves grossly intact. Skin: No rashes or petechiae noted. Psych: Normal affect. Lymphatics: No cervical, calvicular, axillary or inguinal LAD.   LAB RESULTS:  Lab Results  Component Value Date   NA 143 08/21/2013   K 4.2 08/21/2013   CL 108 (H) 08/21/2013   CO2 29 08/21/2013   GLUCOSE 86 08/21/2013   BUN 36 (H) 08/21/2013   CREATININE 1.51 (H) 08/21/2013   CALCIUM 10.4 (H) 08/21/2013   GFRNONAA 35 (L) 08/21/2013   GFRAA 41 (L) 08/21/2013    No results found for: WBC, NEUTROABS, HGB, HCT, MCV, PLT   STUDIES: No results found.  ASSESSMENT: Iron deficiency anemia  PLAN:    1. Iron deficiency anemia:  Patient expressed understanding and was in agreement with this plan. She also understands that She can call clinic at any time with any questions, concerns, or complaints.   No matching staging information was found for the patient.  Lloyd Huger, MD   01/14/2016 1:07 AM

## 2016-01-16 ENCOUNTER — Ambulatory Visit: Payer: Medicare Other | Admitting: Oncology

## 2016-01-20 DIAGNOSIS — H2000 Unspecified acute and subacute iridocyclitis: Secondary | ICD-10-CM | POA: Diagnosis not present

## 2016-01-22 NOTE — Progress Notes (Signed)
[place]  [phone] [phone]  ID: DEBBORAH ALONGE OB: Jun 04, 1945  MR#: [id number]  AOZ#:[id number]  Patient Care Team: Lavera Guise, MD as PCP - General (Internal Medicine)  CHIEF COMPLAINT: Iron deficiency anemia.  INTERVAL HISTORY: Patient is a 71 year old female whose last evaluated in clinic nearly 6 years ago. She is referred back for persistent iron deficiency anemia. Patient also has a history of sickle cell trait. Currently, she feels well and is asymptomatic. She has no neurologic complaints. She denies any recent fevers or illnesses. She has good appetite and denies weight loss. She has no chest pain or shortness of breath. She denies any nausea, vomiting, constipation, or diarrhea. She has no melena or hematochezia. She has no urinary complaints. Patient feels at her baseline and offers no specific complaints today.  REVIEW OF SYSTEMS:   Review of Systems  Constitutional: Negative.  Negative for fever, malaise/fatigue and weight loss.  Respiratory: Negative.  Negative for cough and shortness of breath.   Cardiovascular: Negative.  Negative for chest pain and leg swelling.  Gastrointestinal: Negative.  Negative for abdominal pain, blood in stool and melena.  Genitourinary: Negative.   Musculoskeletal: Negative.   Neurological: Negative.  Negative for weakness.  Psychiatric/Behavioral: Negative.  The patient is not nervous/anxious.     As per HPI. Otherwise, a complete review of systems is negative.  PAST MEDICAL HISTORY: Past Medical History:  Diagnosis Date  . Arthritis   . Bronchitis   . CHF (congestive heart failure) ([place])   . Chronic kidney disease    decreased kidney function  . Diabetes mellitus without complication ([place])   . Gout   . Hypertension   . Multiple allergies   . Shortness of breath dyspnea    w/ exertion  . Sleep apnea    CPAP  . Swelling of both lower extremities   . Wheezing     PAST  SURGICAL HISTORY: Past Surgical History:  Procedure Laterality Date  . ABDOMINAL HYSTERECTOMY    . BREAST BIOPSY Left 1989   neg  . CATARACT EXTRACTION W/PHACO Right 05/21/2015   Procedure: CATARACT EXTRACTION PHACO AND INTRAOCULAR LENS PLACEMENT (IOC);  Surgeon: Milus Height, MD;  Location: [facility] ORS;  Service: Ophthalmology;  Laterality: Right;  [place]: AP%: 10.0 CDE: 9.53  . COLONOSCOPY    . JOINT REPLACEMENT Bilateral    knee  . PARS PLANA VITRECTOMY Right 05/21/2015   Procedure: PARS PLANA VITRECTOMY WITH 25 GAUGE;  Surgeon: Milus Height, MD;  Location: [facility] ORS;  Service: Ophthalmology;  Laterality: Right;  Lot # [id number] H Endo laser: Watts: 300 Pulse Duration: 200 Total Pulses:143   . shoulder surgery      FAMILY HISTORY: Family History  Problem Relation Age of Onset  . Breast cancer Cousin   . Breast cancer Other     ADVANCED DIRECTIVES (Y/N):  N  HEALTH MAINTENANCE: Social History  Substance Use Topics  . Smoking status: Never Smoker  . Smokeless tobacco: Never Used  . Alcohol use No     Colonoscopy:  PAP:  Bone density:  Lipid panel:  Allergies  Allergen Reactions  . Altace [Ramipril] Cough    Current Outpatient Prescriptions  Medication Sig Dispense Refill  . aspirin EC 325 MG tablet Take 325 mg by mouth.    Marland Kitchen atorvastatin (LIPITOR) 20 MG tablet Take by mouth.    . bisoprolol-hydrochlorothiazide (ZIAC) 2.5-6.25 MG tablet Take by mouth.    . cyclobenzaprine (FLEXERIL) 10 MG tablet Take 5 mg  by mouth 2 (two) times daily.    . diphenhydrAMINE (ALLERGY) 25 MG tablet Take 25 mg by mouth daily.    . febuxostat (ULORIC) 40 MG tablet     . folic acid (FOLVITE) 1 MG tablet Take by mouth.    . furosemide (LASIX) 40 MG tablet Take 40 mg by mouth daily.    . Garlic 1245 MG CAPS Take by mouth.    Marland Kitchen glimepiride (AMARYL) 1 MG tablet     . glucose blood test strip 1 each by Other route as needed for other. Use as instructed    . hydrALAZINE (APRESOLINE) 25 MG  tablet Take 25 mg by mouth 3 (three) times daily.    Marland Kitchen ketoconazole (NIZORAL) 2 % shampoo     . montelukast (SINGULAIR) 10 MG tablet Take by mouth.    . naproxen sodium (ANAPROX) 220 MG tablet Take 220 mg by mouth as needed.    . Omega-3 Fatty Acids (FISH OIL PO) Take by mouth.    . polyethylene glycol (MIRALAX / GLYCOLAX) packet Take 17 g by mouth daily as needed.    . Risedronate Sodium 35 MG TBEC Take 35 mg by mouth.    . Sulfacetamide Sodium (SODIUM SULFACETAMIDE) 10 % SHAM Apply topically.    . traMADol (ULTRAM) 50 MG tablet 1-2 po tid prn rib pain    . Vitamin D, Ergocalciferol, (DRISDOL) 50000 units CAPS capsule      No current facility-administered medications for this visit.     OBJECTIVE: Vitals:   01/23/16 0957  BP: (!) 164/77  Pulse: 70  Resp: 20  Temp: 97.5 F (36.4 C)     Body mass index is 42.29 kg/m.    ECOG FS:0 - Asymptomatic  General: Well-developed, well-nourished, no acute distress. Eyes: Pink conjunctiva, anicteric sclera. HEENT: Normocephalic, moist mucous membranes, clear oropharnyx. Lungs: Clear to auscultation bilaterally. Heart: Regular rate and rhythm. No rubs, murmurs, or gallops. Abdomen: Soft, nontender, nondistended. No organomegaly noted, normoactive bowel sounds. Musculoskeletal: No edema, cyanosis, or clubbing. Neuro: Alert, answering all questions appropriately. Cranial nerves grossly intact. Skin: No rashes or petechiae noted. Psych: Normal affect. Lymphatics: No cervical, calvicular, axillary or inguinal LAD.   LAB RESULTS:  Lab Results  Component Value Date   NA 143 08/21/2013   K 4.2 08/21/2013   CL 108 (H) 08/21/2013   CO2 29 08/21/2013   GLUCOSE 86 08/21/2013   BUN 36 (H) 08/21/2013   CREATININE 1.51 (H) 08/21/2013   CALCIUM 10.4 (H) 08/21/2013   GFRNONAA 35 (L) 08/21/2013   GFRAA 41 (L) 08/21/2013    Lab Results  Component Value Date   WBC 6.0 01/23/2016   HGB 11.2 (L) 01/23/2016   HCT 34.3 (L) 01/23/2016   MCV 85.9  01/23/2016   PLT 124 (L) 01/23/2016     STUDIES: No results found.  ASSESSMENT: Iron deficiency anemia  PLAN:    1. Iron deficiency anemia:Patient's hemoglobin is mildly decreased, but improved from her most recent blood draw. Unclear her baseline, but given her underlying sickle cell trait she likely has a mild baseline anemia. The remainder of her blood work including iron stores is pending from today. Return to clinic in 1 month for further evaluation. If all of her other bloodwork returns within normal limits, this appointment can be rescheduled for 3 months. 2. Sickle cell trait: No intervention needed. Patient likely has a baseline anemia. 3. Renal insufficiency: Patient's creatinine appears to be at her baseline. She does not require Procrit  at this time, but could consider in the future if her hemoglobin remained persistently below 10.0.  4. Hypertension: Blood pressure elevated today. Continue monitoring and treatment per primary care.  Patient expressed understanding and was in agreement with this plan. She also understands that She can call clinic at any time with any questions, concerns, or complaints.    Lloyd Huger, MD   01/23/2016 2:35 PM

## 2016-01-23 ENCOUNTER — Inpatient Hospital Stay: Payer: Medicare Other

## 2016-01-23 ENCOUNTER — Inpatient Hospital Stay: Payer: Medicare Other | Attending: Oncology | Admitting: Oncology

## 2016-01-23 VITALS — BP 164/77 | HR 70 | Temp 97.5°F | Resp 20 | Wt 238.8 lb

## 2016-01-23 DIAGNOSIS — G4730 Sleep apnea, unspecified: Secondary | ICD-10-CM | POA: Diagnosis not present

## 2016-01-23 DIAGNOSIS — Z803 Family history of malignant neoplasm of breast: Secondary | ICD-10-CM | POA: Diagnosis not present

## 2016-01-23 DIAGNOSIS — I129 Hypertensive chronic kidney disease with stage 1 through stage 4 chronic kidney disease, or unspecified chronic kidney disease: Secondary | ICD-10-CM

## 2016-01-23 DIAGNOSIS — N189 Chronic kidney disease, unspecified: Secondary | ICD-10-CM | POA: Diagnosis not present

## 2016-01-23 DIAGNOSIS — R062 Wheezing: Secondary | ICD-10-CM

## 2016-01-23 DIAGNOSIS — M129 Arthropathy, unspecified: Secondary | ICD-10-CM

## 2016-01-23 DIAGNOSIS — D509 Iron deficiency anemia, unspecified: Secondary | ICD-10-CM | POA: Diagnosis not present

## 2016-01-23 DIAGNOSIS — R0602 Shortness of breath: Secondary | ICD-10-CM | POA: Diagnosis not present

## 2016-01-23 DIAGNOSIS — M109 Gout, unspecified: Secondary | ICD-10-CM

## 2016-01-23 DIAGNOSIS — M7989 Other specified soft tissue disorders: Secondary | ICD-10-CM

## 2016-01-23 DIAGNOSIS — Z79899 Other long term (current) drug therapy: Secondary | ICD-10-CM | POA: Diagnosis not present

## 2016-01-23 DIAGNOSIS — D573 Sickle-cell trait: Secondary | ICD-10-CM | POA: Diagnosis not present

## 2016-01-23 DIAGNOSIS — I509 Heart failure, unspecified: Secondary | ICD-10-CM

## 2016-01-23 DIAGNOSIS — Z7982 Long term (current) use of aspirin: Secondary | ICD-10-CM

## 2016-01-23 DIAGNOSIS — E1122 Type 2 diabetes mellitus with diabetic chronic kidney disease: Secondary | ICD-10-CM | POA: Diagnosis not present

## 2016-01-23 LAB — CBC
HEMATOCRIT: 34.3 % — AB (ref 35.0–47.0)
Hemoglobin: 11.2 g/dL — ABNORMAL LOW (ref 12.0–16.0)
MCH: 28.1 pg (ref 26.0–34.0)
MCHC: 32.7 g/dL (ref 32.0–36.0)
MCV: 85.9 fL (ref 80.0–100.0)
Platelets: 124 10*3/uL — ABNORMAL LOW (ref 150–440)
RBC: 3.99 MIL/uL (ref 3.80–5.20)
RDW: 13.4 % (ref 11.5–14.5)
WBC: 6 10*3/uL (ref 3.6–11.0)

## 2016-01-23 LAB — IRON AND TIBC
Iron: 76 ug/dL (ref 28–170)
SATURATION RATIOS: 28 % (ref 10.4–31.8)
TIBC: 270 ug/dL (ref 250–450)
UIBC: 194 ug/dL

## 2016-01-23 LAB — RETICULOCYTES
RBC.: 3.96 MIL/uL (ref 3.80–5.20)
RETIC COUNT ABSOLUTE: 67.3 10*3/uL (ref 19.0–183.0)
Retic Ct Pct: 1.7 % (ref 0.4–3.1)

## 2016-01-23 LAB — DAT, POLYSPECIFIC AHG (ARMC ONLY): Polyspecific AHG test: NEGATIVE

## 2016-01-23 LAB — LACTATE DEHYDROGENASE: LDH: 154 U/L (ref 98–192)

## 2016-01-23 LAB — VITAMIN B12: Vitamin B-12: 364 pg/mL (ref 180–914)

## 2016-01-23 LAB — FERRITIN: Ferritin: 213 ng/mL (ref 11–307)

## 2016-01-23 LAB — FOLATE: Folate: 47.1 ng/mL (ref >5.9)

## 2016-01-23 NOTE — Progress Notes (Signed)
Patient here today for initial consultation regarding anemia. Patient denies any concerns today.

## 2016-01-24 LAB — ERYTHROPOIETIN: Erythropoietin: 22.1 m[IU]/mL — ABNORMAL HIGH (ref 2.6–18.5)

## 2016-01-24 LAB — HAPTOGLOBIN: Haptoglobin: 198 mg/dL (ref 34–200)

## 2016-01-26 LAB — PROTEIN ELECTROPHORESIS, SERUM
A/G RATIO SPE: 1.6 (ref 0.7–1.7)
ALBUMIN ELP: 3.7 g/dL (ref 2.9–4.4)
Alpha-1-Globulin: 0.2 g/dL (ref 0.0–0.4)
Alpha-2-Globulin: 0.7 g/dL (ref 0.4–1.0)
Beta Globulin: 1 g/dL (ref 0.7–1.3)
GLOBULIN, TOTAL: 2.3 g/dL (ref 2.2–3.9)
Gamma Globulin: 0.4 g/dL (ref 0.4–1.8)
PDF: 0
Total Protein ELP: 6 g/dL (ref 6.0–8.5)

## 2016-02-09 DIAGNOSIS — E782 Mixed hyperlipidemia: Secondary | ICD-10-CM | POA: Diagnosis not present

## 2016-02-09 DIAGNOSIS — I110 Hypertensive heart disease with heart failure: Secondary | ICD-10-CM | POA: Diagnosis not present

## 2016-02-09 DIAGNOSIS — E669 Obesity, unspecified: Secondary | ICD-10-CM | POA: Diagnosis not present

## 2016-02-09 DIAGNOSIS — D631 Anemia in chronic kidney disease: Secondary | ICD-10-CM | POA: Diagnosis not present

## 2016-02-09 DIAGNOSIS — E1122 Type 2 diabetes mellitus with diabetic chronic kidney disease: Secondary | ICD-10-CM | POA: Diagnosis not present

## 2016-02-09 DIAGNOSIS — F331 Major depressive disorder, recurrent, moderate: Secondary | ICD-10-CM | POA: Diagnosis not present

## 2016-02-25 ENCOUNTER — Inpatient Hospital Stay: Payer: Medicare Other | Attending: Oncology

## 2016-02-25 DIAGNOSIS — D509 Iron deficiency anemia, unspecified: Secondary | ICD-10-CM | POA: Diagnosis not present

## 2016-02-25 DIAGNOSIS — Z7984 Long term (current) use of oral hypoglycemic drugs: Secondary | ICD-10-CM | POA: Diagnosis not present

## 2016-02-25 DIAGNOSIS — D573 Sickle-cell trait: Secondary | ICD-10-CM | POA: Insufficient documentation

## 2016-02-25 DIAGNOSIS — Z7982 Long term (current) use of aspirin: Secondary | ICD-10-CM | POA: Diagnosis not present

## 2016-02-25 DIAGNOSIS — E119 Type 2 diabetes mellitus without complications: Secondary | ICD-10-CM | POA: Diagnosis not present

## 2016-02-25 DIAGNOSIS — G4730 Sleep apnea, unspecified: Secondary | ICD-10-CM | POA: Insufficient documentation

## 2016-02-25 DIAGNOSIS — I10 Essential (primary) hypertension: Secondary | ICD-10-CM | POA: Insufficient documentation

## 2016-02-25 DIAGNOSIS — M129 Arthropathy, unspecified: Secondary | ICD-10-CM | POA: Insufficient documentation

## 2016-02-25 DIAGNOSIS — N2889 Other specified disorders of kidney and ureter: Secondary | ICD-10-CM | POA: Insufficient documentation

## 2016-02-25 DIAGNOSIS — Z803 Family history of malignant neoplasm of breast: Secondary | ICD-10-CM | POA: Diagnosis not present

## 2016-02-25 DIAGNOSIS — M109 Gout, unspecified: Secondary | ICD-10-CM | POA: Diagnosis not present

## 2016-02-25 DIAGNOSIS — I509 Heart failure, unspecified: Secondary | ICD-10-CM | POA: Insufficient documentation

## 2016-02-25 DIAGNOSIS — M7989 Other specified soft tissue disorders: Secondary | ICD-10-CM | POA: Diagnosis not present

## 2016-02-25 LAB — CBC WITH DIFFERENTIAL/PLATELET
BASOS ABS: 0 10*3/uL (ref 0–0.1)
BASOS PCT: 1 %
EOS PCT: 4 %
Eosinophils Absolute: 0.2 10*3/uL (ref 0–0.7)
HEMATOCRIT: 36 % (ref 35.0–47.0)
Hemoglobin: 11.6 g/dL — ABNORMAL LOW (ref 12.0–16.0)
Lymphocytes Relative: 29 %
Lymphs Abs: 1.6 10*3/uL (ref 1.0–3.6)
MCH: 27.5 pg (ref 26.0–34.0)
MCHC: 32.2 g/dL (ref 32.0–36.0)
MCV: 85.3 fL (ref 80.0–100.0)
MONO ABS: 0.6 10*3/uL (ref 0.2–0.9)
Monocytes Relative: 11 %
NEUTROS ABS: 3 10*3/uL (ref 1.4–6.5)
Neutrophils Relative %: 55 %
PLATELETS: 134 10*3/uL — AB (ref 150–440)
RBC: 4.22 MIL/uL (ref 3.80–5.20)
RDW: 13.4 % (ref 11.5–14.5)
WBC: 5.4 10*3/uL (ref 3.6–11.0)

## 2016-02-25 LAB — FERRITIN: FERRITIN: 232 ng/mL (ref 11–307)

## 2016-02-25 LAB — IRON AND TIBC
IRON: 55 ug/dL (ref 28–170)
SATURATION RATIOS: 22 % (ref 10.4–31.8)
TIBC: 247 ug/dL — ABNORMAL LOW (ref 250–450)
UIBC: 192 ug/dL

## 2016-02-25 NOTE — Progress Notes (Signed)
[place]  [phone]) [phone] [phone]) [phone]  ID: Frances Greene OB: October 15, 1945  MR#: [id number]  CWC#:[id number]  Patient Care Team: Lavera Guise, MD as PCP - General (Internal Medicine)  CHIEF COMPLAINT: Iron deficiency anemia.  INTERVAL HISTORY: Patient returns to clinic today for repeat laboratory work and further evaluation. She continues to feel well and is asymptomatic. She has no neurologic complaints. She denies any recent fevers or illnesses. She has a good appetite and denies weight loss. She has no chest pain or shortness of breath. She denies any nausea, vomiting, constipation, or diarrhea. She has no melena or hematochezia. She has no urinary complaints. Patient feels at her baseline and offers no specific complaints today.  REVIEW OF SYSTEMS:   Review of Systems  Constitutional: Negative.  Negative for fever, malaise/fatigue and weight loss.  Respiratory: Negative.  Negative for cough and shortness of breath.   Cardiovascular: Negative.  Negative for chest pain and leg swelling.  Gastrointestinal: Negative.  Negative for abdominal pain, blood in stool and melena.  Genitourinary: Negative.   Musculoskeletal: Negative.   Neurological: Negative.  Negative for weakness.  Psychiatric/Behavioral: Negative.  The patient is not nervous/anxious.     As per HPI. Otherwise, a complete review of systems is negative.  PAST MEDICAL HISTORY: Past Medical History:  Diagnosis Date  . Arthritis   . Bronchitis   . CHF (congestive heart failure) ([place])   . Chronic kidney disease    decreased kidney function  . Diabetes mellitus without complication ([place])   . Gout   . Hypertension   . Multiple allergies   . Shortness of breath dyspnea    w/ exertion  . Sleep apnea    CPAP  . Swelling of both lower extremities   . Wheezing     PAST SURGICAL HISTORY: Past Surgical History:  Procedure Laterality Date  . ABDOMINAL HYSTERECTOMY    . BREAST  BIOPSY Left 1989   neg  . CATARACT EXTRACTION W/PHACO Right 05/21/2015   Procedure: CATARACT EXTRACTION PHACO AND INTRAOCULAR LENS PLACEMENT (IOC);  Surgeon: Milus Height, MD;  Location: [facility] ORS;  Service: Ophthalmology;  Laterality: Right;  [place]: AP%: 10.0 CDE: 9.53  . COLONOSCOPY    . JOINT REPLACEMENT Bilateral    knee  . PARS PLANA VITRECTOMY Right 05/21/2015   Procedure: PARS PLANA VITRECTOMY WITH 25 GAUGE;  Surgeon: Milus Height, MD;  Location: [facility] ORS;  Service: Ophthalmology;  Laterality: Right;  Lot # [id number] H Endo laser: Watts: 300 Pulse Duration: 200 Total Pulses:143   . shoulder surgery      FAMILY HISTORY: Family History  Problem Relation Age of Onset  . Breast cancer Cousin   . Breast cancer Other     ADVANCED DIRECTIVES (Y/N):  N  HEALTH MAINTENANCE: Social History  Substance Use Topics  . Smoking status: Never Smoker  . Smokeless tobacco: Never Used  . Alcohol use No     Colonoscopy:  PAP:  Bone density:  Lipid panel:  Allergies  Allergen Reactions  . Altace [Ramipril] Cough    Current Outpatient Prescriptions  Medication Sig Dispense Refill  . aspirin EC 325 MG tablet Take 325 mg by mouth.    Marland Kitchen atorvastatin (LIPITOR) 20 MG tablet Take by mouth.    . bisoprolol-hydrochlorothiazide (ZIAC) 2.5-6.25 MG tablet Take by mouth.    . citalopram (CELEXA) 10 MG tablet Take 5 mg by mouth daily.    . cyclobenzaprine (FLEXERIL) 10 MG tablet Take 5 mg by mouth 2 (  two) times daily.    . diphenhydrAMINE (ALLERGY) 25 MG tablet Take 25 mg by mouth daily.    . folic acid (FOLVITE) 1 MG tablet Take by mouth.    . furosemide (LASIX) 40 MG tablet Take 20 mg by mouth daily.     . Garlic 4098 MG CAPS Take by mouth.    Marland Kitchen glimepiride (AMARYL) 1 MG tablet Take 1 mg by mouth daily with breakfast.     . glucose blood test strip 1 each by Other route as needed for other. Use as instructed    . hydrALAZINE (APRESOLINE) 25 MG tablet Take 25 mg by mouth 3 (three) times  daily.    Marland Kitchen ketoconazole (NIZORAL) 2 % shampoo     . montelukast (SINGULAIR) 10 MG tablet Take by mouth.    . naproxen sodium (ANAPROX) 220 MG tablet Take 220 mg by mouth as needed.    . Omega-3 Fatty Acids (FISH OIL PO) Take by mouth.    . polyethylene glycol (MIRALAX / GLYCOLAX) packet Take 17 g by mouth daily as needed.    . Sulfacetamide Sodium (SODIUM SULFACETAMIDE) 10 % SHAM Apply topically.    . traMADol (ULTRAM) 50 MG tablet 1-2 po tid prn rib pain    . Vitamin D, Ergocalciferol, (DRISDOL) 50000 units CAPS capsule      No current facility-administered medications for this visit.     OBJECTIVE: Vitals:   02/27/16 0916  BP: (!) 152/73  Pulse: 63  Resp: 18  Temp: 97.1 F (36.2 C)     Body mass index is 39.62 kg/m.    ECOG FS:0 - Asymptomatic  General: Well-developed, well-nourished, no acute distress. Eyes: Pink conjunctiva, anicteric sclera. Lungs: Clear to auscultation bilaterally. Heart: Regular rate and rhythm. No rubs, murmurs, or gallops. Abdomen: Soft, nontender, nondistended. No organomegaly noted, normoactive bowel sounds. Musculoskeletal: No edema, cyanosis, or clubbing. Neuro: Alert, answering all questions appropriately. Cranial nerves grossly intact. Skin: No rashes or petechiae noted. Psych: Normal affect.   LAB RESULTS:  Lab Results  Component Value Date   NA 143 08/21/2013   K 4.2 08/21/2013   CL 108 (H) 08/21/2013   CO2 29 08/21/2013   GLUCOSE 86 08/21/2013   BUN 36 (H) 08/21/2013   CREATININE 1.51 (H) 08/21/2013   CALCIUM 10.4 (H) 08/21/2013   GFRNONAA 35 (L) 08/21/2013   GFRAA 41 (L) 08/21/2013    Lab Results  Component Value Date   WBC 5.4 02/25/2016   NEUTROABS 3.0 02/25/2016   HGB 11.6 (L) 02/25/2016   HCT 36.0 02/25/2016   MCV 85.3 02/25/2016   PLT 134 (L) 02/25/2016   Lab Results  Component Value Date   IRON 55 02/25/2016   TIBC 247 (L) 02/25/2016   IRONPCTSAT 22 02/25/2016   Lab Results  Component Value Date   FERRITIN  232 02/25/2016     STUDIES: No results found.  ASSESSMENT: Iron deficiency anemia  PLAN:    1. Iron deficiency anemia: Patient's hemoglobin is mildly decreased, but remains unchanged. Given her underlying sickle cell trait she likely has a mild baseline anemia. The remainder of her blood work including iron stores is either negative or within normal limits. Return to clinic in 4 months with repeat laboratory work and further evaluation. 2. Sickle cell trait: No intervention needed. Patient likely has a baseline anemia. 3. Renal insufficiency: Patient's creatinine appears to be at her baseline. She does not require Procrit at this time, but could consider in the future if her  hemoglobin remained persistently below 10.0.  4. Hypertension: Blood pressure elevated today. Continue monitoring and treatment per primary care.  Patient expressed understanding and was in agreement with this plan. She also understands that She can call clinic at any time with any questions, concerns, or complaints.    Lloyd Huger, MD   02/27/2016 2:39 PM

## 2016-02-27 ENCOUNTER — Inpatient Hospital Stay: Payer: Medicare Other

## 2016-02-27 ENCOUNTER — Inpatient Hospital Stay (HOSPITAL_BASED_OUTPATIENT_CLINIC_OR_DEPARTMENT_OTHER): Payer: Medicare Other | Admitting: Oncology

## 2016-02-27 VITALS — BP 152/73 | HR 63 | Temp 97.1°F | Resp 18 | Wt 223.7 lb

## 2016-02-27 DIAGNOSIS — D509 Iron deficiency anemia, unspecified: Secondary | ICD-10-CM | POA: Diagnosis not present

## 2016-02-27 DIAGNOSIS — E119 Type 2 diabetes mellitus without complications: Secondary | ICD-10-CM

## 2016-02-27 DIAGNOSIS — Z7982 Long term (current) use of aspirin: Secondary | ICD-10-CM

## 2016-02-27 DIAGNOSIS — N2889 Other specified disorders of kidney and ureter: Secondary | ICD-10-CM | POA: Diagnosis not present

## 2016-02-27 DIAGNOSIS — D573 Sickle-cell trait: Secondary | ICD-10-CM

## 2016-02-27 DIAGNOSIS — I10 Essential (primary) hypertension: Secondary | ICD-10-CM | POA: Diagnosis not present

## 2016-02-27 DIAGNOSIS — Z7984 Long term (current) use of oral hypoglycemic drugs: Secondary | ICD-10-CM

## 2016-02-27 DIAGNOSIS — M7989 Other specified soft tissue disorders: Secondary | ICD-10-CM

## 2016-02-27 DIAGNOSIS — Z803 Family history of malignant neoplasm of breast: Secondary | ICD-10-CM

## 2016-02-27 DIAGNOSIS — M109 Gout, unspecified: Secondary | ICD-10-CM

## 2016-02-27 DIAGNOSIS — G4730 Sleep apnea, unspecified: Secondary | ICD-10-CM

## 2016-02-27 DIAGNOSIS — M129 Arthropathy, unspecified: Secondary | ICD-10-CM

## 2016-02-27 DIAGNOSIS — I509 Heart failure, unspecified: Secondary | ICD-10-CM

## 2016-02-27 NOTE — Progress Notes (Signed)
Patient is here for follow up, she does have constipation even when using miralax.

## 2016-03-19 ENCOUNTER — Encounter: Payer: Self-pay | Admitting: Internal Medicine

## 2016-03-19 ENCOUNTER — Encounter: Payer: Self-pay | Admitting: Physician Assistant

## 2016-04-13 DIAGNOSIS — I6523 Occlusion and stenosis of bilateral carotid arteries: Secondary | ICD-10-CM | POA: Diagnosis not present

## 2016-04-13 DIAGNOSIS — E1122 Type 2 diabetes mellitus with diabetic chronic kidney disease: Secondary | ICD-10-CM | POA: Diagnosis not present

## 2016-04-13 DIAGNOSIS — G4730 Sleep apnea, unspecified: Secondary | ICD-10-CM | POA: Diagnosis not present

## 2016-04-13 DIAGNOSIS — I10 Essential (primary) hypertension: Secondary | ICD-10-CM | POA: Diagnosis not present

## 2016-04-13 DIAGNOSIS — Z0001 Encounter for general adult medical examination with abnormal findings: Secondary | ICD-10-CM | POA: Diagnosis not present

## 2016-04-13 DIAGNOSIS — I5022 Chronic systolic (congestive) heart failure: Secondary | ICD-10-CM | POA: Diagnosis not present

## 2016-04-14 ENCOUNTER — Other Ambulatory Visit: Payer: Self-pay | Admitting: Internal Medicine

## 2016-04-14 DIAGNOSIS — Z1231 Encounter for screening mammogram for malignant neoplasm of breast: Secondary | ICD-10-CM

## 2016-05-04 DIAGNOSIS — E1165 Type 2 diabetes mellitus with hyperglycemia: Secondary | ICD-10-CM | POA: Diagnosis not present

## 2016-05-04 DIAGNOSIS — E782 Mixed hyperlipidemia: Secondary | ICD-10-CM | POA: Diagnosis not present

## 2016-05-04 DIAGNOSIS — E7529 Other sphingolipidosis: Secondary | ICD-10-CM | POA: Diagnosis not present

## 2016-05-04 DIAGNOSIS — M109 Gout, unspecified: Secondary | ICD-10-CM | POA: Diagnosis not present

## 2016-05-04 DIAGNOSIS — I10 Essential (primary) hypertension: Secondary | ICD-10-CM | POA: Diagnosis not present

## 2016-05-05 DIAGNOSIS — I6523 Occlusion and stenosis of bilateral carotid arteries: Secondary | ICD-10-CM | POA: Diagnosis not present

## 2016-06-01 DIAGNOSIS — H401112 Primary open-angle glaucoma, right eye, moderate stage: Secondary | ICD-10-CM | POA: Diagnosis not present

## 2016-06-23 ENCOUNTER — Inpatient Hospital Stay: Payer: Medicare Other | Attending: Oncology

## 2016-06-23 DIAGNOSIS — D509 Iron deficiency anemia, unspecified: Secondary | ICD-10-CM | POA: Insufficient documentation

## 2016-06-23 DIAGNOSIS — Z803 Family history of malignant neoplasm of breast: Secondary | ICD-10-CM | POA: Diagnosis not present

## 2016-06-23 DIAGNOSIS — N189 Chronic kidney disease, unspecified: Secondary | ICD-10-CM | POA: Diagnosis not present

## 2016-06-23 DIAGNOSIS — M109 Gout, unspecified: Secondary | ICD-10-CM | POA: Diagnosis not present

## 2016-06-23 DIAGNOSIS — I130 Hypertensive heart and chronic kidney disease with heart failure and stage 1 through stage 4 chronic kidney disease, or unspecified chronic kidney disease: Secondary | ICD-10-CM | POA: Insufficient documentation

## 2016-06-23 DIAGNOSIS — E1122 Type 2 diabetes mellitus with diabetic chronic kidney disease: Secondary | ICD-10-CM | POA: Insufficient documentation

## 2016-06-23 DIAGNOSIS — D573 Sickle-cell trait: Secondary | ICD-10-CM | POA: Insufficient documentation

## 2016-06-23 DIAGNOSIS — Z79899 Other long term (current) drug therapy: Secondary | ICD-10-CM | POA: Insufficient documentation

## 2016-06-23 DIAGNOSIS — G4730 Sleep apnea, unspecified: Secondary | ICD-10-CM | POA: Insufficient documentation

## 2016-06-23 DIAGNOSIS — Z7984 Long term (current) use of oral hypoglycemic drugs: Secondary | ICD-10-CM | POA: Insufficient documentation

## 2016-06-23 DIAGNOSIS — I509 Heart failure, unspecified: Secondary | ICD-10-CM | POA: Diagnosis not present

## 2016-06-23 DIAGNOSIS — Z7982 Long term (current) use of aspirin: Secondary | ICD-10-CM | POA: Diagnosis not present

## 2016-06-23 LAB — CBC WITH DIFFERENTIAL/PLATELET
BASOS ABS: 0 10*3/uL (ref 0–0.1)
Basophils Relative: 1 %
EOS PCT: 4 %
Eosinophils Absolute: 0.2 10*3/uL (ref 0–0.7)
HEMATOCRIT: 34.4 % — AB (ref 35.0–47.0)
HEMOGLOBIN: 11.3 g/dL — AB (ref 12.0–16.0)
LYMPHS ABS: 1.3 10*3/uL (ref 1.0–3.6)
LYMPHS PCT: 22 %
MCH: 28.1 pg (ref 26.0–34.0)
MCHC: 32.8 g/dL (ref 32.0–36.0)
MCV: 85.5 fL (ref 80.0–100.0)
Monocytes Absolute: 0.6 10*3/uL (ref 0.2–0.9)
Monocytes Relative: 9 %
NEUTROS ABS: 3.7 10*3/uL (ref 1.4–6.5)
Neutrophils Relative %: 64 %
Platelets: 124 10*3/uL — ABNORMAL LOW (ref 150–440)
RBC: 4.02 MIL/uL (ref 3.80–5.20)
RDW: 13.4 % (ref 11.5–14.5)
WBC: 5.9 10*3/uL (ref 3.6–11.0)

## 2016-06-23 LAB — IRON AND TIBC
Iron: 65 ug/dL (ref 28–170)
SATURATION RATIOS: 24 % (ref 10.4–31.8)
TIBC: 268 ug/dL (ref 250–450)
UIBC: 203 ug/dL

## 2016-06-23 LAB — FERRITIN: Ferritin: 191 ng/mL (ref 11–307)

## 2016-06-24 NOTE — Progress Notes (Signed)
[name]  Telephone:(336) [phone] Fax:(336) [phone]  ID: Frances Greene OB: [birth year]  MR#: [id number]  NTZ#:[id number]  Patient Care Team: Lavera Guise, MD as PCP - General (Internal Medicine)  CHIEF COMPLAINT: Iron deficiency anemia.  INTERVAL HISTORY: Patient returns to clinic today for repeat laboratory work and further evaluation. She continues to feel well and is asymptomatic. She has no neurologic complaints. She denies any recent fevers or illnesses. She has a good appetite and denies weight loss. She has no chest pain or shortness of breath. She denies any nausea, vomiting, constipation, or diarrhea. She has no melena or hematochezia. She has no urinary complaints. Patient feels at her baseline and offers no specific complaints today.  REVIEW OF SYSTEMS:   Review of Systems  Constitutional: Negative.  Negative for fever, malaise/fatigue and weight loss.  Respiratory: Negative.  Negative for cough and shortness of breath.   Cardiovascular: Negative.  Negative for chest pain and leg swelling.  Gastrointestinal: Negative.  Negative for abdominal pain, blood in stool and melena.  Genitourinary: Negative.   Musculoskeletal: Negative.   Neurological: Negative.  Negative for weakness.  Psychiatric/Behavioral: Negative.  The patient is not nervous/anxious.     As per HPI. Otherwise, a complete review of systems is negative.  PAST MEDICAL HISTORY: Past Medical History:  Diagnosis Date  . Arthritis   . Bronchitis   . CHF (congestive heart failure) ([place])   . Chronic kidney disease    decreased kidney function  . Diabetes mellitus without complication ([place])   . Gout   . Hypertension   . Multiple allergies   . Shortness of breath dyspnea    w/ exertion  . Sleep apnea    CPAP  . Swelling of both lower extremities   . Wheezing     PAST SURGICAL HISTORY: Past Surgical History:  Procedure Laterality Date  . ABDOMINAL HYSTERECTOMY    . BREAST  BIOPSY Left 1989   neg  . CATARACT EXTRACTION W/PHACO Right 05/21/2015   Procedure: CATARACT EXTRACTION PHACO AND INTRAOCULAR LENS PLACEMENT (IOC);  Surgeon: Milus Height, MD;  Location: [facility] ORS;  Service: Ophthalmology;  Laterality: Right;  [place]: AP%: 10.0 CDE: 9.53  . COLONOSCOPY    . JOINT REPLACEMENT Bilateral    knee  . PARS PLANA VITRECTOMY Right 05/21/2015   Procedure: PARS PLANA VITRECTOMY WITH 25 GAUGE;  Surgeon: Milus Height, MD;  Location: [facility] ORS;  Service: Ophthalmology;  Laterality: Right;  Lot # [id number] H Endo laser: Watts: 300 Pulse Duration: 200 Total Pulses:143   . shoulder surgery      FAMILY HISTORY: Family History  Problem Relation Age of Onset  . Breast cancer Cousin   . Breast cancer Other     ADVANCED DIRECTIVES (Y/N):  N  HEALTH MAINTENANCE: Social History  Substance Use Topics  . Smoking status: Never Smoker  . Smokeless tobacco: Never Used  . Alcohol use No     Colonoscopy:  PAP:  Bone density:  Lipid panel:  Allergies  Allergen Reactions  . Altace [Ramipril] Cough    Current Outpatient Prescriptions  Medication Sig Dispense Refill  . Amino Acids (DAILY AMINO 6000 PO) Inject 0.6 mg into the skin daily.    Marland Kitchen aspirin EC 325 MG tablet Take 325 mg by mouth.    Marland Kitchen atorvastatin (LIPITOR) 20 MG tablet Take by mouth.    . bisoprolol-hydrochlorothiazide (ZIAC) 2.5-6.25 MG tablet Take by mouth.    . citalopram (CELEXA) 10 MG tablet Take 5 mg by  mouth daily.    . cyclobenzaprine (FLEXERIL) 10 MG tablet Take 5 mg by mouth 2 (two) times daily.    . diphenhydrAMINE (ALLERGY) 25 MG tablet Take 25 mg by mouth daily.    . Ferrous Sulfate 134 MG TABS Take 1 tablet by mouth daily.    . folic acid (FOLVITE) 1 MG tablet Take by mouth.    . furosemide (LASIX) 40 MG tablet Take 20 mg by mouth daily.     . Garlic 9326 MG CAPS Take by mouth.    Marland Kitchen glimepiride (AMARYL) 1 MG tablet Take 1 mg by mouth daily with breakfast.     . glucose blood test strip 1 each  by Other route as needed for other. Use as instructed    . hydrALAZINE (APRESOLINE) 25 MG tablet Take 25 mg by mouth 3 (three) times daily.    Marland Kitchen ketoconazole (NIZORAL) 2 % shampoo     . montelukast (SINGULAIR) 10 MG tablet Take by mouth.    . naproxen sodium (ANAPROX) 220 MG tablet Take 220 mg by mouth as needed.    . Omega-3 Fatty Acids (FISH OIL PO) Take by mouth.    . polyethylene glycol (MIRALAX / GLYCOLAX) packet Take 17 g by mouth daily as needed.    . Sulfacetamide Sodium (SODIUM SULFACETAMIDE) 10 % SHAM Apply topically.    . Vitamin D, Ergocalciferol, (DRISDOL) 50000 units CAPS capsule     . traMADol (ULTRAM) 50 MG tablet 1-2 po tid prn rib pain     No current facility-administered medications for this visit.     OBJECTIVE: Vitals:   06/25/16 0935  BP: 134/74  Pulse: 67  Resp: 18  Temp: 97.2 F (36.2 C)     Body mass index is 40.03 kg/m.    ECOG FS:0 - Asymptomatic  General: Well-developed, well-nourished, no acute distress. Eyes: Pink conjunctiva, anicteric sclera. Lungs: Clear to auscultation bilaterally. Heart: Regular rate and rhythm. No rubs, murmurs, or gallops. Abdomen: Soft, nontender, nondistended. No organomegaly noted, normoactive bowel sounds. Musculoskeletal: No edema, cyanosis, or clubbing. Neuro: Alert, answering all questions appropriately. Cranial nerves grossly intact. Skin: No rashes or petechiae noted. Psych: Normal affect.   LAB RESULTS:  Lab Results  Component Value Date   NA 143 08/21/2013   K 4.2 08/21/2013   CL 108 (H) 08/21/2013   CO2 29 08/21/2013   GLUCOSE 86 08/21/2013   BUN 36 (H) 08/21/2013   CREATININE 1.51 (H) 08/21/2013   CALCIUM 10.4 (H) 08/21/2013   GFRNONAA 35 (L) 08/21/2013   GFRAA 41 (L) 08/21/2013    Lab Results  Component Value Date   WBC 5.9 06/23/2016   NEUTROABS 3.7 06/23/2016   HGB 11.3 (L) 06/23/2016   HCT 34.4 (L) 06/23/2016   MCV 85.5 06/23/2016   PLT 124 (L) 06/23/2016   Lab Results  Component Value  Date   IRON 65 06/23/2016   TIBC 268 06/23/2016   IRONPCTSAT 24 06/23/2016   Lab Results  Component Value Date   FERRITIN 191 06/23/2016     STUDIES: Mm Screening Breast Tomo Bilateral  Result Date: 07/01/2016 CLINICAL DATA:  Screening. EXAM: 2D DIGITAL SCREENING BILATERAL MAMMOGRAM WITH CAD AND ADJUNCT TOMO COMPARISON:  Previous exam(s). ACR Breast Density Category b: There are scattered areas of fibroglandular density. FINDINGS: There are no findings suspicious for malignancy. Images were processed with CAD. IMPRESSION: No mammographic evidence of malignancy. A result letter of this screening mammogram will be mailed directly to the patient. RECOMMENDATION: Screening mammogram  in one year. (Code:SM-B-01Y) BI-RADS CATEGORY  1: Negative. Electronically Signed   By: Franki Cabot M.D.   On: 07/01/2016 13:03    ASSESSMENT: Iron deficiency anemia  PLAN:    1. Iron deficiency anemia: Patient's hemoglobin is mildly decreased, but remains unchanged. Given her underlying sickle cell trait she likely has a mild baseline anemia. The remainder of her blood work including iron stores are either negative or within normal limits. After discussion with the patient, it was agreed upon that no further follow-up is necessary.  2. Sickle cell trait: No intervention needed. Patient likely has a baseline anemia. 3. Renal insufficiency: Patient's creatinine appears to be at her baseline. She does not require Procrit at this time, but could consider in the future if her hemoglobin remained persistently below 10.0.  4. Hypertension: Blood pressure within normal limits today. Continue monitoring and treatment per primary care.  Patient expressed understanding and was in agreement with this plan. She also understands that She can call clinic at any time with any questions, concerns, or complaints.    Lloyd Huger, MD   07/02/2016 11:21 AM

## 2016-06-25 ENCOUNTER — Inpatient Hospital Stay: Payer: Medicare Other

## 2016-06-25 ENCOUNTER — Inpatient Hospital Stay (HOSPITAL_BASED_OUTPATIENT_CLINIC_OR_DEPARTMENT_OTHER): Payer: Medicare Other | Admitting: Oncology

## 2016-06-25 ENCOUNTER — Other Ambulatory Visit: Payer: Medicare Other

## 2016-06-25 VITALS — BP 134/74 | HR 67 | Temp 97.2°F | Resp 18 | Wt 226.0 lb

## 2016-06-25 DIAGNOSIS — Z79899 Other long term (current) drug therapy: Secondary | ICD-10-CM

## 2016-06-25 DIAGNOSIS — I130 Hypertensive heart and chronic kidney disease with heart failure and stage 1 through stage 4 chronic kidney disease, or unspecified chronic kidney disease: Secondary | ICD-10-CM

## 2016-06-25 DIAGNOSIS — Z803 Family history of malignant neoplasm of breast: Secondary | ICD-10-CM

## 2016-06-25 DIAGNOSIS — M109 Gout, unspecified: Secondary | ICD-10-CM | POA: Diagnosis not present

## 2016-06-25 DIAGNOSIS — N189 Chronic kidney disease, unspecified: Secondary | ICD-10-CM

## 2016-06-25 DIAGNOSIS — D473 Essential (hemorrhagic) thrombocythemia: Secondary | ICD-10-CM

## 2016-06-25 DIAGNOSIS — E1122 Type 2 diabetes mellitus with diabetic chronic kidney disease: Secondary | ICD-10-CM | POA: Diagnosis not present

## 2016-06-25 DIAGNOSIS — D573 Sickle-cell trait: Secondary | ICD-10-CM | POA: Diagnosis not present

## 2016-06-25 DIAGNOSIS — G4730 Sleep apnea, unspecified: Secondary | ICD-10-CM

## 2016-06-25 DIAGNOSIS — D509 Iron deficiency anemia, unspecified: Secondary | ICD-10-CM | POA: Diagnosis not present

## 2016-06-25 DIAGNOSIS — Z7982 Long term (current) use of aspirin: Secondary | ICD-10-CM | POA: Diagnosis not present

## 2016-06-25 DIAGNOSIS — Z7984 Long term (current) use of oral hypoglycemic drugs: Secondary | ICD-10-CM | POA: Diagnosis not present

## 2016-06-25 DIAGNOSIS — I509 Heart failure, unspecified: Secondary | ICD-10-CM

## 2016-07-01 ENCOUNTER — Other Ambulatory Visit: Payer: Self-pay | Admitting: Internal Medicine

## 2016-07-01 ENCOUNTER — Ambulatory Visit
Admission: RE | Admit: 2016-07-01 | Discharge: 2016-07-01 | Disposition: A | Discharge status: Home / Self Care | Payer: Medicare Other | Source: Ambulatory Visit | Attending: Internal Medicine | Admitting: Internal Medicine

## 2016-07-01 DIAGNOSIS — Z1231 Encounter for screening mammogram for malignant neoplasm of breast: Secondary | ICD-10-CM | POA: Diagnosis not present

## 2016-10-12 DIAGNOSIS — Z6841 Body Mass Index (BMI) 40.0 and over, adult: Secondary | ICD-10-CM | POA: Diagnosis not present

## 2016-10-12 DIAGNOSIS — E1165 Type 2 diabetes mellitus with hyperglycemia: Secondary | ICD-10-CM | POA: Diagnosis not present

## 2016-10-12 DIAGNOSIS — I10 Essential (primary) hypertension: Secondary | ICD-10-CM | POA: Diagnosis not present

## 2016-10-12 DIAGNOSIS — Z23 Encounter for immunization: Secondary | ICD-10-CM | POA: Diagnosis not present

## 2016-11-25 DIAGNOSIS — G4733 Obstructive sleep apnea (adult) (pediatric): Secondary | ICD-10-CM | POA: Diagnosis not present

## 2016-11-25 DIAGNOSIS — R0602 Shortness of breath: Secondary | ICD-10-CM | POA: Diagnosis not present

## 2016-11-25 DIAGNOSIS — N183 Chronic kidney disease, stage 3 (moderate): Secondary | ICD-10-CM | POA: Diagnosis not present

## 2016-12-03 DIAGNOSIS — H401112 Primary open-angle glaucoma, right eye, moderate stage: Secondary | ICD-10-CM | POA: Diagnosis not present

## 2016-12-16 DIAGNOSIS — H401113 Primary open-angle glaucoma, right eye, severe stage: Secondary | ICD-10-CM | POA: Diagnosis not present

## 2017-01-04 ENCOUNTER — Telehealth: Payer: Self-pay

## 2017-01-04 NOTE — Telephone Encounter (Signed)
Called in phar hydralazine 25 mg 270 with 3 refills and montelukast 10 mg 30 with 6 to [facility] ph[facility] /np

## 2017-01-04 NOTE — Telephone Encounter (Signed)
Called in phar to [place] for her hydralazine and singulair

## 2017-01-19 DIAGNOSIS — E559 Vitamin D deficiency, unspecified: Secondary | ICD-10-CM | POA: Insufficient documentation

## 2017-01-19 DIAGNOSIS — E782 Mixed hyperlipidemia: Secondary | ICD-10-CM | POA: Insufficient documentation

## 2017-01-19 DIAGNOSIS — N183 Chronic kidney disease, stage 3 unspecified: Secondary | ICD-10-CM | POA: Insufficient documentation

## 2017-01-19 DIAGNOSIS — I10 Essential (primary) hypertension: Secondary | ICD-10-CM | POA: Insufficient documentation

## 2017-01-19 DIAGNOSIS — D649 Anemia, unspecified: Secondary | ICD-10-CM | POA: Insufficient documentation

## 2017-01-19 DIAGNOSIS — I739 Peripheral vascular disease, unspecified: Secondary | ICD-10-CM | POA: Insufficient documentation

## 2017-01-19 DIAGNOSIS — E162 Hypoglycemia, unspecified: Secondary | ICD-10-CM | POA: Insufficient documentation

## 2017-01-19 DIAGNOSIS — E1169 Type 2 diabetes mellitus with other specified complication: Secondary | ICD-10-CM | POA: Insufficient documentation

## 2017-01-19 DIAGNOSIS — G4733 Obstructive sleep apnea (adult) (pediatric): Secondary | ICD-10-CM | POA: Insufficient documentation

## 2017-01-19 DIAGNOSIS — E1122 Type 2 diabetes mellitus with diabetic chronic kidney disease: Secondary | ICD-10-CM | POA: Insufficient documentation

## 2017-01-19 DIAGNOSIS — E213 Hyperparathyroidism, unspecified: Secondary | ICD-10-CM | POA: Insufficient documentation

## 2017-01-19 DIAGNOSIS — R0602 Shortness of breath: Secondary | ICD-10-CM | POA: Insufficient documentation

## 2017-01-19 DIAGNOSIS — M109 Gout, unspecified: Secondary | ICD-10-CM | POA: Insufficient documentation

## 2017-01-19 DIAGNOSIS — R430 Anosmia: Secondary | ICD-10-CM | POA: Insufficient documentation

## 2017-01-19 DIAGNOSIS — M542 Cervicalgia: Secondary | ICD-10-CM | POA: Insufficient documentation

## 2017-01-19 DIAGNOSIS — R609 Edema, unspecified: Secondary | ICD-10-CM | POA: Insufficient documentation

## 2017-01-20 ENCOUNTER — Ambulatory Visit: Payer: Self-pay | Admitting: Nurse Practitioner

## 2017-02-15 ENCOUNTER — Ambulatory Visit: Payer: Self-pay | Admitting: Internal Medicine

## 2017-03-02 ENCOUNTER — Ambulatory Visit: Payer: BC Managed Care – PPO | Admitting: Internal Medicine

## 2017-03-02 ENCOUNTER — Encounter: Payer: Self-pay | Admitting: Internal Medicine

## 2017-03-02 VITALS — BP 147/65 | HR 60 | Resp 16 | Ht 62.0 in | Wt 238.2 lb

## 2017-03-02 DIAGNOSIS — R300 Dysuria: Secondary | ICD-10-CM

## 2017-03-02 DIAGNOSIS — E1122 Type 2 diabetes mellitus with diabetic chronic kidney disease: Secondary | ICD-10-CM

## 2017-03-02 DIAGNOSIS — N183 Chronic kidney disease, stage 3 unspecified: Secondary | ICD-10-CM

## 2017-03-02 DIAGNOSIS — I10 Essential (primary) hypertension: Secondary | ICD-10-CM

## 2017-03-02 DIAGNOSIS — G4733 Obstructive sleep apnea (adult) (pediatric): Secondary | ICD-10-CM

## 2017-03-02 DIAGNOSIS — M1A09X0 Idiopathic chronic gout, multiple sites, without tophus (tophi): Secondary | ICD-10-CM

## 2017-03-02 DIAGNOSIS — E782 Mixed hyperlipidemia: Secondary | ICD-10-CM

## 2017-03-02 NOTE — Progress Notes (Signed)
[facility] [place], [place] [address]  Internal MEDICINE  Office Visit Note  Patient Name: Frances Greene  [id number]  [id number]  Date of Service: 03/28/2017  Chief Complaint  Patient presents with  . Diabetes  . Hypertension  . Hyperlipidemia    HPI  Pt is here for routine follow up. Diabetes is under good control. She aslo has CKD C/O sinus congestion and cough last week  Sore right thumb due to gout Slight sob has decreased EF  Current Medication: Outpatient Encounter Medications as of 03/02/2017  Medication Sig Note  . aspirin EC 325 MG tablet Take 325 mg by mouth. 01/23/2016: Received from: [place]: Take 325 mg by mouth.  Marland Kitchen atorvastatin (LIPITOR) 20 MG tablet Take 1 tablet (20 mg total) by mouth daily.   . bisoprolol-hydrochlorothiazide (ZIAC) 2.5-6.25 MG tablet Take by mouth. 01/23/2016: Received from: [place]: Take 1 tablet by mouth once daily.  . Cholecalciferol (VITAMIN D-1000 MAX ST) 1000 units tablet Take by mouth.   . citalopram (CELEXA) 10 MG tablet Take 5 mg by mouth daily.   . cyclobenzaprine (FLEXERIL) 10 MG tablet Take 5 mg by mouth 2 (two) times daily.   . diphenhydrAMINE (ALLERGY) 25 MG tablet Take 25 mg by mouth daily.   . febuxostat (ULORIC) 40 MG tablet    . folic acid (FOLVITE) 1 MG tablet Take by mouth. 01/23/2016: Received from: [place]: Take 1 mg by mouth once daily.  . furosemide (LASIX) 40 MG tablet Take 20 mg by mouth daily.    . Garlic 2595 MG CAPS Take by mouth. 01/23/2016: Received from: [place]: Take by mouth.  Marland Kitchen glimepiride (AMARYL) 1 MG tablet Take 1 mg by mouth daily with breakfast.  01/23/2016: Received from: [facility]  . glucose blood test strip 1 each by Other route as needed for other. Use as instructed   . hydrALAZINE (APRESOLINE) 25 MG tablet Take 25 mg by mouth 3 (three)  times daily.    . hydrochlorothiazide (MICROZIDE) 12.5 MG capsule    . ketoconazole (NIZORAL) 2 % shampoo  01/23/2016: Received from: [facility]  . liraglutide (VICTOZA) 18 MG/3ML SOPN Inject into the skin.   . meloxicam (MOBIC) 7.5 MG tablet Take 7.5 mg by mouth.   . montelukast (SINGULAIR) 10 MG tablet Take by mouth.  01/23/2016: Received from: [place]: Take 10 mg by mouth nightly.  . naproxen sodium (ANAPROX) 220 MG tablet Take 220 mg by mouth as needed.   . Omega-3 Fatty Acids (FISH OIL PO) Take by mouth. 01/23/2016: Received from: [place]: Take 1 capsule by mouth 2 (two) times daily.  . polyethylene glycol (MIRALAX / GLYCOLAX) packet Take 17 g by mouth daily as needed.   . Sulfacetamide Sodium (SODIUM SULFACETAMIDE) 10 % SHAM Apply topically.   . traMADol (ULTRAM) 50 MG tablet 1-2 po tid prn rib pain 01/23/2016: Received from: [facility]  . Amino Acids (DAILY AMINO 6000 PO) Inject 0.6 mg into the skin daily.   . Ferrous Sulfate 134 MG TABS Take 1 tablet by mouth daily.   . Vitamin D, Ergocalciferol, (DRISDOL) 50000 units CAPS capsule  01/23/2016: Received from: [facility]  . [DISCONTINUED] allopurinol (ZYLOPRIM) 100 MG tablet TAKE ONE TAB AT NIGHT FOR GOUT   . [DISCONTINUED] atorvastatin (LIPITOR) 20  MG tablet Take by mouth. 01/23/2016: Received from: [place]: Take 20 mg by mouth once daily. Take 1/2 tablet daily  . [DISCONTINUED] Garlic 9030 MG CAPS Take by mouth.    No facility-administered encounter medications on file as of 03/02/2017.     Surgical History: Past Surgical History:  Procedure Laterality Date  . ABDOMINAL HYSTERECTOMY    . BREAST BIOPSY Left 1989   neg  . CATARACT EXTRACTION W/PHACO Right 05/21/2015   Procedure: CATARACT EXTRACTION PHACO AND INTRAOCULAR LENS PLACEMENT (IOC);  Surgeon: Milus Height, MD;  Location: [facility]  ORS;  Service: Ophthalmology;  Laterality: Right;  [place]: AP%: 10.0 CDE: 9.53  . COLONOSCOPY    . JOINT REPLACEMENT Bilateral    knee  . PARS PLANA VITRECTOMY Right 05/21/2015   Procedure: PARS PLANA VITRECTOMY WITH 25 GAUGE;  Surgeon: Milus Height, MD;  Location: [facility] ORS;  Service: Ophthalmology;  Laterality: Right;  Lot # [id number] H Endo laser: Watts: 300 Pulse Duration: 200 Total Pulses:143   . shoulder surgery      Medical History: Past Medical History:  Diagnosis Date  . Arthritis   . Bronchitis   . CHF (congestive heart failure) ([place])   . Chronic kidney disease    decreased kidney function  . Diabetes mellitus without complication ([place])   . Gout   . Hypertension   . Multiple allergies   . Shortness of breath dyspnea    w/ exertion  . Sleep apnea    CPAP  . Swelling of both lower extremities   . Wheezing     Family History: Family History  Problem Relation Age of Onset  . Breast cancer Cousin   . Breast cancer Other     Social History   Socioeconomic History  . Marital status: Divorced    Spouse name: Not on file  . Number of children: Not on file  . Years of education: Not on file  . Highest education level: Not on file  Social Needs  . Financial resource strain: Not on file  . Food insecurity - worry: Not on file  . Food insecurity - inability: Not on file  . Transportation needs - medical: Not on file  . Transportation needs - non-medical: Not on file  Occupational History  . Not on file  Tobacco Use  . Smoking status: Never Smoker  . Smokeless tobacco: Never Used  Substance and Sexual Activity  . Alcohol use: No  . Drug use: Not on file  . Sexual activity: Not on file  Other Topics Concern  . Not on file  Social History Narrative  . Not on file      Review of Systems  Constitutional: Negative for chills, fatigue and unexpected weight change.  HENT: Positive for postnasal drip. Negative for congestion, rhinorrhea, sneezing and sore  throat.   Eyes: Negative for redness.  Respiratory: Negative for cough, chest tightness and shortness of breath.   Cardiovascular: Negative for chest pain and palpitations.  Gastrointestinal: Negative for abdominal pain, constipation, diarrhea, nausea and vomiting.  Genitourinary: Negative for dysuria and frequency.  Musculoskeletal: Positive for arthralgias. Negative for back pain, joint swelling and neck pain.  Skin: Negative for rash.  Neurological: Negative.  Negative for tremors and numbness.  Hematological: Negative for adenopathy. Does not bruise/bleed easily.  Psychiatric/Behavioral: Negative for behavioral problems (Depression), sleep disturbance and suicidal ideas. The patient is not nervous/anxious.     Vital Signs: BP (!) 147/65 (BP Location: Right Arm, Cuff Size: Large)  Pulse 60   Resp 16   Ht 5\' 2"  (1.575 m)   Wt 238 lb 3.2 oz (108 kg)   SpO2 98%   BMI 43.57 kg/m    Physical Exam  Constitutional: She is oriented to person, place, and time. She appears well-developed and well-nourished. No distress.  HENT:  Head: Normocephalic and atraumatic.  Mouth/Throat: Oropharynx is clear and moist. No oropharyngeal exudate.  Eyes: EOM are normal. Pupils are equal, round, and reactive to light.  Neck: Normal range of motion. Neck supple. No JVD present. No tracheal deviation present. No thyromegaly present.  Cardiovascular: Normal rate, regular rhythm and normal heart sounds. Exam reveals no gallop and no friction rub.  No murmur heard. Pulmonary/Chest: Effort normal. No respiratory distress. She has no wheezes. She has no rales. She exhibits no tenderness.  Abdominal: Soft. Bowel sounds are normal.  Musculoskeletal: She exhibits tenderness and deformity.  Right thumb ( trigger finger)  Lymphadenopathy:    She has no cervical adenopathy.  Neurological: She is alert and oriented to person, place, and time. No cranial nerve deficit.  Skin: Skin is warm and dry. She is not  diaphoretic.  Psychiatric: She has a normal mood and affect. Her behavior is normal. Judgment and thought content normal.    Assessment/Plan: 1. Hypertension, unspecified type - Continue all medicines as before  - CBC with Differential/Platelet - Lipid Panel With LDL/HDL Ratio - TSH - T4, free - Comprehensive metabolic panel - Uric acid  2. Type 2 diabetes mellitus with stage 3 chronic kidney disease, without long-term current use of insulin (HCC) - last hg A1c is 6.5  3. Idiopathic chronic gout of multiple sites without tophus - Continue Mobic prn oly due to CKD and low dose Allopurinol  4. Dysuria - Urinalysis// microalbumin   General Counseling: Salinda verbalizes understanding of the findings of todays visit and agrees with plan of treatment. I have discussed any further diagnostic evaluation that may be needed or ordered today. We also reviewed her medications today. she has been encouraged to call the office with any questions or concerns that should arise related to todays visit. Cardiac risk factor modification:  1. Control blood pressure. 2. Exercise as prescribed. 3. Follow low sodium, low fat diet. and low fat and low cholestrol diet. 4. Take ASA 81mg  once a day.    Orders Placed This Encounter  Procedures  . CBC with Differential/Platelet  . Lipid Panel With LDL/HDL Ratio  . TSH  . T4, free  . Urinalysis  . Comprehensive metabolic panel  . Uric acid    Meds ordered this encounter  Medications  . DISCONTD: allopurinol (ZYLOPRIM) 100 MG tablet    Sig: TAKE ONE TAB AT NIGHT FOR GOUT    Dispense:  90 tablet    Refill:  6  . atorvastatin (LIPITOR) 20 MG tablet    Sig: Take 1 tablet (20 mg total) by mouth daily.    Dispense:  90 tablet    Refill:  3    Time spent:25 Minutes   Dr Lavera Guise Internal medicine

## 2017-03-03 ENCOUNTER — Other Ambulatory Visit: Payer: Self-pay

## 2017-03-03 MED ORDER — ALLOPURINOL 100 MG PO TABS: ORAL_TABLET | ORAL | 6 refills | Status: DC

## 2017-03-03 MED ORDER — ATORVASTATIN CALCIUM 20 MG PO TABS: 20.0000 mg | ORAL_TABLET | Freq: Every day | ORAL | 3 refills | Status: DC

## 2017-04-11 DIAGNOSIS — I10 Essential (primary) hypertension: Secondary | ICD-10-CM | POA: Diagnosis not present

## 2017-04-12 LAB — LIPID PANEL WITH LDL/HDL RATIO
Cholesterol, Total: 137 mg/dL (ref 100–199)
HDL: 56 mg/dL (ref >39)
LDL Calculated: 66 mg/dL (ref 0–99)
LDl/HDL Ratio: 1.2 ratio (ref 0.0–3.2)
Triglycerides: 75 mg/dL (ref 0–149)
VLDL Cholesterol Cal: 15 mg/dL (ref 5–40)

## 2017-04-12 LAB — COMPREHENSIVE METABOLIC PANEL
ALK PHOS: 61 IU/L (ref 39–117)
ALT: 14 IU/L (ref 0–32)
AST: 17 IU/L (ref 0–40)
Albumin/Globulin Ratio: 2 (ref 1.2–2.2)
Albumin: 4.4 g/dL (ref 3.5–4.8)
BUN/Creatinine Ratio: 18 (ref 12–28)
BUN: 29 mg/dL — AB (ref 8–27)
Bilirubin Total: 0.4 mg/dL (ref 0.0–1.2)
CO2: 25 mmol/L (ref 20–29)
CREATININE: 1.6 mg/dL — AB (ref 0.57–1.00)
Calcium: 11.8 mg/dL — ABNORMAL HIGH (ref 8.7–10.3)
Chloride: 106 mmol/L (ref 96–106)
GFR calc Af Amer: 37 mL/min/{1.73_m2} — ABNORMAL LOW (ref >59)
GFR calc non Af Amer: 32 mL/min/{1.73_m2} — ABNORMAL LOW (ref >59)
GLUCOSE: 77 mg/dL (ref 65–99)
Globulin, Total: 2.2 g/dL (ref 1.5–4.5)
Potassium: 3.8 mmol/L (ref 3.5–5.2)
SODIUM: 146 mmol/L — AB (ref 134–144)
Total Protein: 6.6 g/dL (ref 6.0–8.5)

## 2017-04-12 LAB — CBC WITH DIFFERENTIAL/PLATELET
Basophils Absolute: 0 10*3/uL (ref 0.0–0.2)
Basos: 0 %
EOS (ABSOLUTE): 0.3 10*3/uL (ref 0.0–0.4)
EOS: 4 %
HEMATOCRIT: 36.8 % (ref 34.0–46.6)
Hemoglobin: 11.5 g/dL (ref 11.1–15.9)
IMMATURE GRANULOCYTES: 0 %
Immature Grans (Abs): 0 10*3/uL (ref 0.0–0.1)
LYMPHS ABS: 1.8 10*3/uL (ref 0.7–3.1)
Lymphs: 30 %
MCH: 26.7 pg (ref 26.6–33.0)
MCHC: 31.3 g/dL — AB (ref 31.5–35.7)
MCV: 86 fL (ref 79–97)
MONOS ABS: 0.6 10*3/uL (ref 0.1–0.9)
Monocytes: 9 %
NEUTROS PCT: 57 %
Neutrophils Absolute: 3.5 10*3/uL (ref 1.4–7.0)
PLATELETS: 145 10*3/uL — AB (ref 150–379)
RBC: 4.3 x10E6/uL (ref 3.77–5.28)
RDW: 14.3 % (ref 12.3–15.4)
WBC: 6.2 10*3/uL (ref 3.4–10.8)

## 2017-04-12 LAB — T4, FREE: FREE T4: 1.28 ng/dL (ref 0.82–1.77)

## 2017-04-12 LAB — URIC ACID: URIC ACID: 8 mg/dL — AB (ref 2.5–7.1)

## 2017-04-12 LAB — TSH: TSH: 2.78 u[IU]/mL (ref 0.450–4.500)

## 2017-05-25 DIAGNOSIS — R69 Illness, unspecified: Secondary | ICD-10-CM | POA: Diagnosis not present

## 2017-05-26 ENCOUNTER — Other Ambulatory Visit: Payer: Self-pay

## 2017-05-26 MED ORDER — BISOPROLOL-HYDROCHLOROTHIAZIDE 2.5-6.25 MG PO TABS: 1.0000 | ORAL_TABLET | Freq: Two times a day (BID) | ORAL | 3 refills | Status: DC

## 2017-06-02 ENCOUNTER — Ambulatory Visit (INDEPENDENT_AMBULATORY_CARE_PROVIDER_SITE_OTHER): Payer: BC Managed Care – PPO | Admitting: Internal Medicine

## 2017-06-02 ENCOUNTER — Encounter: Payer: Self-pay | Admitting: Internal Medicine

## 2017-06-02 VITALS — BP 130/70 | HR 70 | Resp 16 | Ht 63.0 in | Wt 237.0 lb

## 2017-06-02 DIAGNOSIS — G4733 Obstructive sleep apnea (adult) (pediatric): Secondary | ICD-10-CM

## 2017-06-02 DIAGNOSIS — Z9989 Dependence on other enabling machines and devices: Secondary | ICD-10-CM

## 2017-06-02 DIAGNOSIS — R0602 Shortness of breath: Secondary | ICD-10-CM | POA: Diagnosis not present

## 2017-06-02 DIAGNOSIS — N183 Chronic kidney disease, stage 3 unspecified: Secondary | ICD-10-CM

## 2017-06-02 NOTE — Progress Notes (Signed)
[facility] [place], [place] [address]  Pulmonary Sleep Medicine   Office Visit Note  Patient Name: Frances Greene DOB: 1945-05-03 MRN [id number]  Date of Service: 06/02/2017  Complaints/HPI:  Doing fairly well.  She has been using her CPAP device.  She states that she has had no issues with it no nasal congestion no acute infections.  The machine seems to be working.  She states that she is feeling good in the mornings she has 100% compliance.  Denies having any other issues with the device.  As far as her breathing is concerned she does get some exertional dyspnea at times.  The patient states that she has an inhaler but does not really use it.  Her last pulmonary function was done in 2017 and she has not had any reassessment since then.  As far as her kidney function is concerned this is been under good control on her last creatinine was 1.51 in 2015 recent creatinine was 1.6 discuss with her general measures make sure is a she keeps herself adequately hydrated and wean will monitor her labs periodically.  ROS  General: (-) fever, (-) chills, (-) night sweats, (-) weakness Skin: (-) rashes, (-) itching,. Eyes: (-) visual changes, (-) redness, (-) itching. Nose and Sinuses: (-) nasal stuffiness or itchiness, (-) postnasal drip, (-) nosebleeds, (-) sinus trouble. Mouth and Throat: (-) sore throat, (-) hoarseness. Neck: (-) swollen glands, (-) enlarged thyroid, (-) neck pain. Respiratory: - cough, (-) bloody sputum, - shortness of breath, - wheezing. Cardiovascular: - ankle swelling, (-) chest pain. Lymphatic: (-) lymph node enlargement. Neurologic: (-) numbness, (-) tingling. Psychiatric: (-) anxiety, (-) depression   Current Medication: Outpatient Encounter Medications as of 06/02/2017  Medication Sig Note  . allopurinol (ZYLOPRIM) 100 MG tablet TAKE ONE TAB AT NIGHT FOR GOUT   . Amino Acids (DAILY AMINO 6000 PO) Inject 0.6 mg into the skin daily.    Marland Kitchen aspirin EC 325 MG tablet Take 325 mg by mouth. 01/23/2016: Received from: [place]: Take 325 mg by mouth.  Marland Kitchen atorvastatin (LIPITOR) 20 MG tablet Take 1 tablet (20 mg total) by mouth daily.   . bisoprolol-hydrochlorothiazide (ZIAC) 2.5-6.25 MG tablet Take 1 tablet by mouth 2 (two) times daily.   . Cholecalciferol (VITAMIN D-1000 MAX ST) 1000 units tablet Take by mouth.   . citalopram (CELEXA) 10 MG tablet Take 5 mg by mouth daily.   . cyclobenzaprine (FLEXERIL) 10 MG tablet Take 5 mg by mouth 2 (two) times daily.   . diphenhydrAMINE (ALLERGY) 25 MG tablet Take 25 mg by mouth daily.   . febuxostat (ULORIC) 40 MG tablet    . Ferrous Sulfate 134 MG TABS Take 1 tablet by mouth daily.   . folic acid (FOLVITE) 1 MG tablet Take by mouth. 01/23/2016: Received from: [place]: Take 1 mg by mouth once daily.  . furosemide (LASIX) 40 MG tablet Take 20 mg by mouth daily.    . Garlic 5993 MG CAPS Take by mouth. 01/23/2016: Received from: [place]: Take by mouth.  Marland Kitchen glimepiride (AMARYL) 1 MG tablet Take 1 mg by mouth daily with breakfast.  01/23/2016: Received from: [facility]  . glucose blood test strip 1 each by Other route as needed for other. Use as instructed   . hydrALAZINE (APRESOLINE) 25 MG tablet Take 25 mg by mouth 3 (three) times daily.    . hydrochlorothiazide (  MICROZIDE) 12.5 MG capsule    . ketoconazole (NIZORAL) 2 % shampoo  01/23/2016: Received from: [facility]  . liraglutide (VICTOZA) 18 MG/3ML SOPN Inject into the skin.   . meloxicam (MOBIC) 7.5 MG tablet Take 7.5 mg by mouth.   . montelukast (SINGULAIR) 10 MG tablet Take by mouth.  01/23/2016: Received from: [place]: Take 10 mg by mouth nightly.  . naproxen sodium (ANAPROX) 220 MG tablet Take 220 mg by mouth as needed.   . Omega-3 Fatty Acids (FISH OIL PO) Take by mouth. 01/23/2016: Received  from: [place]: Take 1 capsule by mouth 2 (two) times daily.  . polyethylene glycol (MIRALAX / GLYCOLAX) packet Take 17 g by mouth daily as needed.   . Sulfacetamide Sodium (SODIUM SULFACETAMIDE) 10 % SHAM Apply topically.   . traMADol (ULTRAM) 50 MG tablet 1-2 po tid prn rib pain 01/23/2016: Received from: [facility]  . Vitamin D, Ergocalciferol, (DRISDOL) 50000 units CAPS capsule  01/23/2016: Received from: [place]   No facility-administered encounter medications on file as of 06/02/2017.     Surgical History: Past Surgical History:  Procedure Laterality Date  . ABDOMINAL HYSTERECTOMY    . BREAST BIOPSY Left 1989   neg  . CATARACT EXTRACTION W/PHACO Right 05/21/2015   Procedure: CATARACT EXTRACTION PHACO AND INTRAOCULAR LENS PLACEMENT (IOC);  Surgeon: Milus Height, MD;  Location: [facility] ORS;  Service: Ophthalmology;  Laterality: Right;  [place]: AP%: 10.0 CDE: 9.53  . COLONOSCOPY    . JOINT REPLACEMENT Bilateral    knee  . PARS PLANA VITRECTOMY Right 05/21/2015   Procedure: PARS PLANA VITRECTOMY WITH 25 GAUGE;  Surgeon: Milus Height, MD;  Location: [facility] ORS;  Service: Ophthalmology;  Laterality: Right;  Lot # [id number] H Endo laser: Watts: 300 Pulse Duration: 200 Total Pulses:143   . shoulder surgery      Medical History: Past Medical History:  Diagnosis Date  . Arthritis   . Bronchitis   . CHF (congestive heart failure) ([place])   . Chronic kidney disease    decreased kidney function  . Diabetes mellitus without complication ([place])   . Gout   . Hypertension   . Multiple allergies   . Shortness of breath dyspnea    w/ exertion  . Sleep apnea    CPAP  . Swelling of both lower extremities   . Wheezing     Family History: Family History  Problem Relation Age of Onset  . Breast cancer Cousin   . Breast cancer Other     Social History: Social History   Socioeconomic History  . Marital status: Divorced     Spouse name: Not on file  . Number of children: Not on file  . Years of education: Not on file  . Highest education level: Not on file  Occupational History  . Not on file  Social Needs  . Financial resource strain: Not on file  . Food insecurity:    Worry: Not on file    Inability: Not on file  . Transportation needs:    Medical: Not on file    Non-medical: Not on file  Tobacco Use  . Smoking status: Former Smoker    Types: Cigarettes  . Smokeless tobacco: Never Used  . Tobacco comment: 4 cigarettes quit 20 years ago  Substance and Sexual Activity  . Alcohol use: No  . Drug use: Not on file  . Sexual activity: Not on  file  Lifestyle  . Physical activity:    Days per week: Not on file    Minutes per session: Not on file  . Stress: Not on file  Relationships  . Social connections:    Talks on phone: Not on file    Gets together: Not on file    Attends religious service: Not on file    Active member of club or organization: Not on file    Attends meetings of clubs or organizations: Not on file    Relationship status: Not on file  . Intimate partner violence:    Fear of current or ex partner: Not on file    Emotionally abused: Not on file    Physically abused: Not on file    Forced sexual activity: Not on file  Other Topics Concern  . Not on file  Social History Narrative  . Not on file    Vital Signs: Blood pressure 130/70, pulse 70, resp. rate 16, height 5\' 3"  (1.6 m), weight 237 lb (107.5 kg), SpO2 95 %.  Examination: General Appearance: The patient is well-developed, well-nourished, and in no distress. Skin: Gross inspection of skin unremarkable. Head: normocephalic, no gross deformities. Eyes: no gross deformities noted. ENT: ears appear grossly normal no exudates. Neck: Supple. No thyromegaly. No LAD. Respiratory: no rhonchi noted. Cardiovascular: Normal S1 and S2 without murmur or rub. Extremities: No cyanosis. pulses are equal. Neurologic: Alert  and oriented. No involuntary movements.  LABS: Recent Results (from the past 2160 hour(s))  CBC with Differential/Platelet     Status: Abnormal   Collection Time: 04/11/17  4:31 PM  Result Value Ref Range   WBC 6.2 3.4 - 10.8 x10E3/uL   RBC 4.30 3.77 - 5.28 x10E6/uL   Hemoglobin 11.5 11.1 - 15.9 g/dL   Hematocrit 36.8 34.0 - 46.6 %   MCV 86 79 - 97 fL   MCH 26.7 26.6 - 33.0 pg   MCHC 31.3 (L) 31.5 - 35.7 g/dL   RDW 14.3 12.3 - 15.4 %   Platelets 145 (L) 150 - 379 x10E3/uL   Neutrophils 57 Not Estab. %   Lymphs 30 Not Estab. %   Monocytes 9 Not Estab. %   Eos 4 Not Estab. %   Basos 0 Not Estab. %   Neutrophils Absolute 3.5 1.4 - 7.0 x10E3/uL   Lymphocytes Absolute 1.8 0.7 - 3.1 x10E3/uL   Monocytes Absolute 0.6 0.1 - 0.9 x10E3/uL   EOS (ABSOLUTE) 0.3 0.0 - 0.4 x10E3/uL   Basophils Absolute 0.0 0.0 - 0.2 x10E3/uL   Immature Granulocytes 0 Not Estab. %   Immature Grans (Abs) 0.0 0.0 - 0.1 x10E3/uL  Lipid Panel With LDL/HDL Ratio     Status: None   Collection Time: 04/11/17  4:31 PM  Result Value Ref Range   Cholesterol, Total 137 100 - 199 mg/dL   Triglycerides 75 0 - 149 mg/dL   HDL 56 >39 mg/dL   VLDL Cholesterol Cal 15 5 - 40 mg/dL   LDL Calculated 66 0 - 99 mg/dL   LDl/HDL Ratio 1.2 0.0 - 3.2 ratio    Comment:                                     LDL/HDL Ratio  Men  Women                               1/2 Avg.Risk  1.0    1.5                                   Avg.Risk  3.6    3.2                                2X Avg.Risk  6.2    5.0                                3X Avg.Risk  8.0    6.1   TSH     Status: None   Collection Time: 04/11/17  4:31 PM  Result Value Ref Range   TSH 2.780 0.450 - 4.500 uIU/mL  T4, free     Status: None   Collection Time: 04/11/17  4:31 PM  Result Value Ref Range   Free T4 1.28 0.82 - 1.77 ng/dL  Comprehensive metabolic panel     Status: Abnormal   Collection Time: 04/11/17  4:31 PM  Result  Value Ref Range   Glucose 77 65 - 99 mg/dL   BUN 29 (H) 8 - 27 mg/dL   Creatinine, Ser 1.60 (H) 0.57 - 1.00 mg/dL   GFR calc non Af Amer 32 (L) >59 mL/min/1.73   GFR calc Af Amer 37 (L) >59 mL/min/1.73   BUN/Creatinine Ratio 18 12 - 28   Sodium 146 (H) 134 - 144 mmol/L   Potassium 3.8 3.5 - 5.2 mmol/L   Chloride 106 96 - 106 mmol/L   CO2 25 20 - 29 mmol/L   Calcium 11.8 (H) 8.7 - 10.3 mg/dL   Total Protein 6.6 6.0 - 8.5 g/dL   Albumin 4.4 3.5 - 4.8 g/dL   Globulin, Total 2.2 1.5 - 4.5 g/dL   Albumin/Globulin Ratio 2.0 1.2 - 2.2   Bilirubin Total 0.4 0.0 - 1.2 mg/dL   Alkaline Phosphatase 61 39 - 117 IU/L   AST 17 0 - 40 IU/L   ALT 14 0 - 32 IU/L  Uric acid     Status: Abnormal   Collection Time: 04/11/17  4:31 PM  Result Value Ref Range   Uric Acid 8.0 (H) 2.5 - 7.1 mg/dL    Comment:            Therapeutic target for gout patients: <6.0    Radiology: Mm Screening Breast Tomo Bilateral  Result Date: 07/01/2016 CLINICAL DATA:  Screening. EXAM: 2D DIGITAL SCREENING BILATERAL MAMMOGRAM WITH CAD AND ADJUNCT TOMO COMPARISON:  Previous exam(s). ACR Breast Density Category b: There are scattered areas of fibroglandular density. FINDINGS: There are no findings suspicious for malignancy. Images were processed with CAD. IMPRESSION: No mammographic evidence of malignancy. A result letter of this screening mammogram will be mailed directly to the patient. RECOMMENDATION: Screening mammogram in one year. (Code:SM-B-01Y) BI-RADS CATEGORY  1: Negative. Electronically Signed   By: Franki Cabot M.D.   On: 07/01/2016 13:03    No results found.  No results found.    Assessment and Plan: Patient Active Problem List   Diagnosis Date Noted  . Type 2 diabetes mellitus with chronic  kidney disease ([place]) 01/19/2017  . Anosmia 01/19/2017  . Cervicalgia 01/19/2017  . Hyperparathyroidism ([place]) 01/19/2017  . Vitamin D deficiency 01/19/2017  . Hypoglycemia 01/19/2017  . Peripheral vascular disease,  unspecified ([place]) 01/19/2017  . Obstructive sleep apnea 01/19/2017  . Essential hypertension 01/19/2017  . Mixed hyperlipidemia 01/19/2017  . Anemia, unspecified 01/19/2017  . Edema 01/19/2017  . SOB (shortness of breath) 01/19/2017  . Gout 01/19/2017  . Morbid obesity ([place]) 01/19/2017  . Chronic kidney disease, stage III (moderate) ([place]) 01/19/2017  . Hypercalcemia 01/19/2017  . Iron deficiency anemia 01/14/2016  . Acute pain of right knee 04/23/2015  . Left wrist pain 08/12/2014  . Status post bilateral unicompartmental knee replacement 11/13/2013  . Arthritis, senescent 08/02/2013    1. OSA   Continue with CPAP at the current pressures.  She needs to monitor her download periodically.  We will continue with supportive care pulmonary toilet and follow along 2. CKD III follow labs patient is being monitored by primary care 3. Morbid obesity she is to work on weight loss will continue with diet and try to incorporate more exercise 4. SOB mainly on exertion. She is a former smoker quit 20 years. She states that she is currently not using any inhalers. Last PFT was 2017 would reassess  General Counseling: I have discussed the findings of the evaluation and examination with Frances Greene.  I have also discussed any further diagnostic evaluation thatmay be needed or ordered today. Frances Greene verbalizes understanding of the findings of todays visit. We also reviewed her medications today and discussed drug interactions and side effects including but not limited excessive drowsiness and altered mental states. We also discussed that there is always a risk not just to her but also people around her. she has been encouraged to call the office with any questions or concerns that should arise related to todays visit.    Time spent: 9min  I have personally obtained a history, examined the patient, evaluated laboratory and imaging results, formulated the assessment and plan and placed orders.    Allyne Gee, MD [facility] Pulmonary and Critical Care Sleep medicine

## 2017-06-02 NOTE — Patient Instructions (Signed)

## 2017-07-06 ENCOUNTER — Encounter: Payer: Self-pay | Admitting: Internal Medicine

## 2017-07-06 ENCOUNTER — Ambulatory Visit: Payer: BC Managed Care – PPO | Admitting: Internal Medicine

## 2017-07-06 DIAGNOSIS — R0602 Shortness of breath: Secondary | ICD-10-CM | POA: Diagnosis not present

## 2017-07-06 LAB — PULMONARY FUNCTION TEST

## 2017-07-12 ENCOUNTER — Encounter: Payer: Self-pay | Admitting: Internal Medicine

## 2017-07-13 ENCOUNTER — Ambulatory Visit: Payer: Self-pay | Admitting: Internal Medicine

## 2017-07-18 NOTE — Procedures (Signed)
Frances Greene, [id number]  DATE OF SERVICE: July 06, 2017  Complete Pulmonary Function Testing Interpretation:  FINDINGS:    The forced vital capacity is normal.  The FEV1 is normal.  The FEV1 FVC ratio is mildly decreased.  Post bronchodilator there is no significant change in the FEV1 however clinical improvement may occur in the absence of spirometric improvement.  The total lung capacity is increased as measured by the body box the residual volume is increased.  The residual volume total lung capacity ratio is increased.  The FRC is increased.  The DLCO is moderately decreased.  IMPRESSION:   patient has a normal spirometry with increased lung volumes and a moderately decreased DLCO. The DLCO may be affected by interstitial lung disease COPD as well as current active smoking.  Clinical correlation is recommended  Allyne Gee, MD [facility] Pulmonary Critical Care Medicine Sleep Medicine

## 2017-07-25 DIAGNOSIS — H401113 Primary open-angle glaucoma, right eye, severe stage: Secondary | ICD-10-CM | POA: Diagnosis not present

## 2017-07-26 ENCOUNTER — Other Ambulatory Visit: Payer: Self-pay | Admitting: Internal Medicine

## 2017-07-27 ENCOUNTER — Ambulatory Visit (INDEPENDENT_AMBULATORY_CARE_PROVIDER_SITE_OTHER): Payer: BC Managed Care – PPO | Admitting: Internal Medicine

## 2017-07-27 ENCOUNTER — Encounter: Payer: Self-pay | Admitting: Internal Medicine

## 2017-07-27 VITALS — BP 154/72 | HR 67 | Resp 16 | Ht 63.0 in | Wt 236.2 lb

## 2017-07-27 DIAGNOSIS — R300 Dysuria: Secondary | ICD-10-CM

## 2017-07-27 DIAGNOSIS — I10 Essential (primary) hypertension: Secondary | ICD-10-CM | POA: Diagnosis not present

## 2017-07-27 DIAGNOSIS — Z0001 Encounter for general adult medical examination with abnormal findings: Secondary | ICD-10-CM

## 2017-07-27 DIAGNOSIS — G4733 Obstructive sleep apnea (adult) (pediatric): Secondary | ICD-10-CM

## 2017-07-27 DIAGNOSIS — Z1239 Encounter for other screening for malignant neoplasm of breast: Secondary | ICD-10-CM

## 2017-07-27 DIAGNOSIS — Z1231 Encounter for screening mammogram for malignant neoplasm of breast: Secondary | ICD-10-CM | POA: Diagnosis not present

## 2017-07-27 DIAGNOSIS — E1165 Type 2 diabetes mellitus with hyperglycemia: Secondary | ICD-10-CM

## 2017-07-27 LAB — POCT GLYCOSYLATED HEMOGLOBIN (HGB A1C): Hemoglobin A1C: 6.7 % — AB (ref 4.0–5.6)

## 2017-07-27 MED ORDER — MELOXICAM 7.5 MG PO TABS: 7.5000 mg | ORAL_TABLET | Freq: Every day | ORAL | 3 refills | Status: DC

## 2017-07-27 MED ORDER — TRAMADOL HCL 50 MG PO TABS: 50.0000 mg | ORAL_TABLET | Freq: Two times a day (BID) | ORAL | 3 refills | Status: DC

## 2017-07-27 MED ORDER — ALLOPURINOL 300 MG PO TABS: ORAL_TABLET | ORAL | 3 refills | Status: DC

## 2017-07-27 NOTE — Progress Notes (Signed)
[facility], [place] [address]  Internal MEDICINE  Office Visit Note  Patient Name: Frances Greene  [id number]  [id number]  Date of Service: 08/04/2017  Chief Complaint  Patient presents with  . Annual Exam   Pt is here for routine health maintenance examination. Diabetes is under good control, pt does have CKD, hypercalcemia and elevated PTH. She has declined endocrinology in the past  Other  This is a recurrent (she is having flare up of her gout, multiple other medical problemsare stable ) problem. The problem occurs constantly. Pertinent negatives include no abdominal pain, arthralgias, chest pain, chills, coughing, diaphoresis, fatigue, headaches, nausea, neck pain or vomiting.   Current Medication: Outpatient Encounter Medications as of 07/27/2017  Medication Sig Note  . allopurinol (ZYLOPRIM) 300 MG tablet TAKE ONE TAB AT NIGHT FOR GOUT   . Amino Acids (DAILY AMINO 6000 PO) Inject 0.6 mg into the skin daily.   Marland Kitchen aspirin EC 325 MG tablet Take 325 mg by mouth. 01/23/2016: Received from: [place]: Take 325 mg by mouth.  Marland Kitchen atorvastatin (LIPITOR) 20 MG tablet Take 1 tablet (20 mg total) by mouth daily.   . bisoprolol-hydrochlorothiazide (ZIAC) 2.5-6.25 MG tablet Take 1 tablet by mouth 2 (two) times daily.   . Cholecalciferol (VITAMIN D-1000 MAX ST) 1000 units tablet Take by mouth.   . citalopram (CELEXA) 10 MG tablet Take 5 mg by mouth daily.   . cyclobenzaprine (FLEXERIL) 10 MG tablet Take 5 mg by mouth 2 (two) times daily.   . diphenhydrAMINE (ALLERGY) 25 MG tablet Take 25 mg by mouth daily.   . Ferrous Sulfate 134 MG TABS Take 1 tablet by mouth daily.   . folic acid (FOLVITE) 1 MG tablet Take by mouth. 01/23/2016: Received from: [place]: Take 1 mg by mouth once daily.  . furosemide (LASIX) 40 MG tablet Take 20 mg by mouth daily.    . Garlic 9371 MG CAPS Take by mouth. 01/23/2016: Received  from: [place]: Take by mouth.  Marland Kitchen glimepiride (AMARYL) 1 MG tablet Take 1 mg by mouth daily with breakfast.  01/23/2016: Received from: [facility]  . glucose blood test strip 1 each by Other route as needed for other. Use as instructed   . hydrALAZINE (APRESOLINE) 25 MG tablet Take 25 mg by mouth 3 (three) times daily.    Marland Kitchen ketoconazole (NIZORAL) 2 % shampoo  01/23/2016: Received from: [facility]  . liraglutide (VICTOZA) 18 MG/3ML SOPN Inject into the skin.   . meloxicam (MOBIC) 7.5 MG tablet Take 1 tablet (7.5 mg total) by mouth daily. Take one tab po qd for gout and arthritis   . montelukast (SINGULAIR) 10 MG tablet Take by mouth.  01/23/2016: Received from: [place]: Take 10 mg by mouth nightly.  . Omega-3 Fatty Acids (FISH OIL PO) Take by mouth. 01/23/2016: Received from: [place]: Take 1 capsule by mouth 2 (two) times daily.  . polyethylene glycol (MIRALAX / GLYCOLAX) packet Take 17 g by mouth daily as needed.   . Sulfacetamide Sodium (SODIUM SULFACETAMIDE) 10 % SHAM Apply topically.   . traMADol (ULTRAM) 50 MG tablet Take 1 tablet (50 mg total) by mouth 2 (two) times daily.   . Vitamin D, Ergocalciferol, (DRISDOL) 50000 units CAPS capsule  01/23/2016: Received from: [facility]  . [DISCONTINUED] allopurinol (ZYLOPRIM) 100  MG tablet TAKE ONE TAB AT NIGHT FOR GOUT   . [DISCONTINUED] febuxostat (ULORIC) 40 MG tablet    . [DISCONTINUED] hydrochlorothiazide (MICROZIDE) 12.5 MG capsule    . [DISCONTINUED] meloxicam (MOBIC) 7.5 MG tablet Take 7.5 mg by mouth.   . [DISCONTINUED] naproxen sodium (ANAPROX) 220 MG tablet Take 220 mg by mouth as needed.   . [DISCONTINUED] traMADol (ULTRAM) 50 MG tablet 1-2 po tid prn rib pain 01/23/2016: Received from: [place]   No facility-administered encounter medications on file as of 07/27/2017.      Surgical History: Past Surgical History:  Procedure Laterality Date  . ABDOMINAL HYSTERECTOMY    . BREAST BIOPSY Left 1989   neg  . CATARACT EXTRACTION W/PHACO Right 05/21/2015   Procedure: CATARACT EXTRACTION PHACO AND INTRAOCULAR LENS PLACEMENT (IOC);  Surgeon: Milus Height, MD;  Location: [facility] ORS;  Service: Ophthalmology;  Laterality: Right;  [place]: AP%: 10.0 CDE: 9.53  . COLONOSCOPY    . JOINT REPLACEMENT Bilateral    knee  . PARS PLANA VITRECTOMY Right 05/21/2015   Procedure: PARS PLANA VITRECTOMY WITH 25 GAUGE;  Surgeon: Milus Height, MD;  Location: [facility] ORS;  Service: Ophthalmology;  Laterality: Right;  Lot # [id number] Endo laser: [place]: 300 Pulse Duration: 200 Total Pulses:143   . shoulder surgery      Medical History: Past Medical History:  Diagnosis Date  . Arthritis   . Bronchitis   . CHF (congestive heart failure) ([place])   . Chronic kidney disease    decreased kidney function  . Diabetes mellitus without complication ([place])   . Gout   . Hypertension   . Multiple allergies   . Shortness of breath dyspnea    w/ exertion  . Sleep apnea    CPAP  . Swelling of both lower extremities   . Wheezing     Family History: Family History  Problem Relation Age of Onset  . Breast cancer Cousin   . Breast cancer Other    Review of Systems  Constitutional: Negative for chills, diaphoresis and fatigue.  HENT: Negative for ear pain, postnasal drip and sinus pressure.   Eyes: Negative for photophobia, discharge, redness, itching and visual disturbance.  Respiratory: Negative for cough, shortness of breath and wheezing.   Cardiovascular: Negative for chest pain, palpitations and leg swelling.  Gastrointestinal: Negative for abdominal pain, constipation, diarrhea, nausea and vomiting.  Genitourinary: Negative for dysuria and flank pain.  Musculoskeletal: Negative for arthralgias, back pain, gait problem and neck pain.  Skin: Negative for color change.   Allergic/Immunologic: Negative for environmental allergies and food allergies.  Neurological: Negative for dizziness and headaches.  Hematological: Does not bruise/bleed easily.  Psychiatric/Behavioral: Negative for agitation, behavioral problems (depression) and hallucinations.   Vital Signs: BP (!) 154/72 (BP Location: Left Arm, Patient Position: Sitting, Cuff Size: Large)   Pulse 67   Resp 16   Ht 5\' 3"  (1.6 m)   Wt 236 lb 3.2 oz (107.1 kg)   SpO2 93%   BMI 41.84 kg/m    Physical Exam  Constitutional: She is oriented to person, place, and time. She appears well-developed and well-nourished. No distress.  HENT:  Head: Normocephalic and atraumatic.  Mouth/Throat: Oropharynx is clear and moist. No oropharyngeal exudate.  Eyes: Pupils are equal, round, and reactive to light. EOM are normal.  Neck: Normal range of motion. Neck supple. No JVD present. No tracheal deviation present. No thyromegaly present.  Cardiovascular: Normal rate, regular rhythm and normal heart sounds. Exam reveals  no gallop and no friction rub.  No murmur heard. Pulmonary/Chest: Effort normal. No respiratory distress. She has no wheezes. She has no rales. She exhibits no tenderness. Right breast exhibits no inverted nipple and no tenderness. Left breast exhibits no inverted nipple and no tenderness. Breasts are symmetrical.  Abdominal: Soft. Bowel sounds are normal.  Musculoskeletal: Normal range of motion.  Lymphadenopathy:    She has no cervical adenopathy.  Neurological: She is alert and oriented to person, place, and time. No cranial nerve deficit.  Skin: Skin is warm and dry. She is not diaphoretic.  Psychiatric: She has a normal mood and affect. Her behavior is normal. Judgment and thought content normal.   LABS: Recent Results (from the past 2160 hour(s))  POCT HgB A1C     Status: Abnormal   Collection Time: 07/27/17  9:39 AM  Result Value Ref Range   Hemoglobin A1C 6.7 (A) 4.0 - 5.6 %   HbA1c POC  (<> result, manual entry)  4.0 - 5.6 %   HbA1c, POC (prediabetic range)  5.7 - 6.4 %   HbA1c, POC (controlled diabetic range)  0.0 - 7.0 %  UA/M w/rflx Culture, Routine     Status: None   Collection Time: 07/27/17  9:51 AM  Result Value Ref Range   Specific Gravity, UA 1.010 1.005 - 1.030   pH, UA 7.0 5.0 - 7.5   Color, UA Yellow Yellow   Appearance Ur Clear Clear   Leukocytes, UA Negative Negative   Protein, UA Negative Negative/Trace   Glucose, UA Negative Negative   Ketones, UA Negative Negative   RBC, UA Negative Negative   Bilirubin, UA Negative Negative   Urobilinogen, Ur 0.2 0.2 - 1.0 mg/dL   Nitrite, UA Negative Negative   Microscopic Examination Comment     Comment: Microscopic follows if indicated.   Microscopic Examination See below:     Comment: Microscopic was indicated and was performed.   Urinalysis Reflex Comment     Comment: This specimen will not reflex to a Urine Culture.  Microalbumin, urine     Status: None   Collection Time: 07/27/17  9:51 AM  Result Value Ref Range   Microalbumin, Urine 42.3 Not Estab. ug/mL  Microscopic Examination     Status: Abnormal   Collection Time: 07/27/17  9:51 AM  Result Value Ref Range   WBC, UA 0-5 0 - 5 /hpf   RBC, UA 0-2 0 - 2 /hpf   Epithelial Cells (non renal) 0-10 0 - 10 /hpf   Casts Present (A) None seen /lpf   Cast Type Hyaline casts N/A   Mucus, UA Present Not Estab.   Bacteria, UA Few None seen/Few  Basic metabolic panel     Status: Abnormal   Collection Time: 08/02/17 11:06 AM  Result Value Ref Range   Glucose 125 (H) 65 - 99 mg/dL   BUN 26 8 - 27 mg/dL   Creatinine, Ser 1.65 (H) 0.57 - 1.00 mg/dL   GFR calc non Af Amer 31 (L) >59 mL/min/1.73   GFR calc Af Amer 36 (L) >59 mL/min/1.73   BUN/Creatinine Ratio 16 12 - 28   Sodium 144 134 - 144 mmol/L   Potassium 4.4 3.5 - 5.2 mmol/L   Chloride 105 96 - 106 mmol/L   CO2 24 20 - 29 mmol/L   Calcium 11.1 (H) 8.7 - 10.3 mg/dL     Assessment/Plan: 1.  Encounter for general adult medical examination with abnormal findings - Labs/ BMD//Colonosopy is  updated   2. Uncontrolled type 2 diabetes mellitus with hyperglycemia (HCC) - POCT HgB A1C - Microalbumin, urine - Basic metabolic panel  3. Essential hypertension, benign - Controlled with meds   4. Dysuria - Check microalbumine - UA/M w/rflx Culture, Routine  5. Breast screening - MM DIGITAL SCREENING BILATERAL; Future  6. Obstructive sleep apnea syndrome Patient has sign and symptoms of OSA ( disturbed sleep, excessive fatigue during the day, uncontrolled bp and abnormal BMI). Baseline sleep study is ordered to further look into this. Long term complications of OSA was addressed with the patient. - Home sleep test  General Counseling: ryan palermo understanding of the findings of todays visit and agrees with plan of treatment. I have discussed any further diagnostic evaluation that may be needed or ordered today. We also reviewed her medications today. she has been encouraged to call the office with any questions or concerns that should arise related to todays visit. Obesity Counseling: Risk Assessment: An assessment of behavioral risk factors was made today and includes lack of exercise sedentary lifestyle, lack of portion control and poor dietary habits.  Risk Modification Advice: She was counseled on portion control guidelines. Restricting daily caloric intake to. . The detrimental long term effects of obesity on her health and ongoing poor compliance was also discussed with the patient.    Orders Placed This Encounter  Procedures  . Microscopic Examination  . MM DIGITAL SCREENING BILATERAL  . UA/M w/rflx Culture, Routine  . Microalbumin, urine  . Basic metabolic panel  . POCT HgB A1C  . Home sleep test    Meds ordered this encounter  Medications  . meloxicam (MOBIC) 7.5 MG tablet    Sig: Take 1 tablet (7.5 mg total) by mouth daily. Take one tab po qd for gout and  arthritis    Dispense:  90 tablet    Refill:  3  . traMADol (ULTRAM) 50 MG tablet    Sig: Take 1 tablet (50 mg total) by mouth 2 (two) times daily.    Dispense:  180 tablet    Refill:  3  . allopurinol (ZYLOPRIM) 300 MG tablet    Sig: TAKE ONE TAB AT NIGHT FOR GOUT    Dispense:  90 tablet    Refill:  3    Time spent:25 Minutes   Lavera Guise, MD  Internal Medicine

## 2017-07-28 LAB — UA/M W/RFLX CULTURE, ROUTINE
BILIRUBIN UA: NEGATIVE
Glucose, UA: NEGATIVE
KETONES UA: NEGATIVE
LEUKOCYTES UA: NEGATIVE
NITRITE UA: NEGATIVE
PROTEIN UA: NEGATIVE
RBC UA: NEGATIVE
SPEC GRAV UA: 1.01 (ref 1.005–1.030)
UUROB: 0.2 mg/dL (ref 0.2–1.0)
pH, UA: 7 (ref 5.0–7.5)

## 2017-07-28 LAB — MICROSCOPIC EXAMINATION

## 2017-07-28 LAB — MICROALBUMIN, URINE: Microalbumin, Urine: 42.3 ug/mL

## 2017-08-01 ENCOUNTER — Other Ambulatory Visit: Payer: Self-pay | Admitting: Internal Medicine

## 2017-08-01 DIAGNOSIS — Z1239 Encounter for other screening for malignant neoplasm of breast: Secondary | ICD-10-CM

## 2017-08-02 DIAGNOSIS — E1165 Type 2 diabetes mellitus with hyperglycemia: Secondary | ICD-10-CM | POA: Diagnosis not present

## 2017-08-03 ENCOUNTER — Other Ambulatory Visit: Payer: Self-pay | Admitting: Internal Medicine

## 2017-08-03 DIAGNOSIS — E1122 Type 2 diabetes mellitus with diabetic chronic kidney disease: Secondary | ICD-10-CM

## 2017-08-03 DIAGNOSIS — N186 End stage renal disease: Principal | ICD-10-CM

## 2017-08-03 LAB — BASIC METABOLIC PANEL
BUN / CREAT RATIO: 16 (ref 12–28)
BUN: 26 mg/dL (ref 8–27)
CHLORIDE: 105 mmol/L (ref 96–106)
CO2: 24 mmol/L (ref 20–29)
Calcium: 11.1 mg/dL — ABNORMAL HIGH (ref 8.7–10.3)
Creatinine, Ser: 1.65 mg/dL — ABNORMAL HIGH (ref 0.57–1.00)
GFR calc Af Amer: 36 mL/min/{1.73_m2} — ABNORMAL LOW (ref >59)
GFR calc non Af Amer: 31 mL/min/{1.73_m2} — ABNORMAL LOW (ref >59)
GLUCOSE: 125 mg/dL — AB (ref 65–99)
POTASSIUM: 4.4 mmol/L (ref 3.5–5.2)
Sodium: 144 mmol/L (ref 134–144)

## 2017-08-03 NOTE — Progress Notes (Signed)
rena 

## 2017-08-03 NOTE — Progress Notes (Signed)
Continue all meds the same.. See does need to have an u/s on her kidneys as her kidneys are a little more slower than before, and shee how her gout is

## 2017-08-04 ENCOUNTER — Telehealth: Payer: Self-pay

## 2017-08-08 ENCOUNTER — Other Ambulatory Visit: Payer: Self-pay | Admitting: Internal Medicine

## 2017-08-10 ENCOUNTER — Telehealth: Payer: Self-pay

## 2017-08-12 ENCOUNTER — Other Ambulatory Visit: Payer: Self-pay

## 2017-08-12 MED ORDER — TRAMADOL HCL 50 MG PO TABS: 50.0000 mg | ORAL_TABLET | Freq: Two times a day (BID) | ORAL | 3 refills | Status: DC

## 2017-08-12 NOTE — Telephone Encounter (Signed)
Per dfk medication was called in.

## 2017-08-16 ENCOUNTER — Ambulatory Visit: Payer: Medicare Other

## 2017-08-17 ENCOUNTER — Encounter: Payer: Self-pay | Admitting: Adult Health

## 2017-08-17 ENCOUNTER — Other Ambulatory Visit: Payer: Self-pay | Admitting: Internal Medicine

## 2017-08-17 ENCOUNTER — Ambulatory Visit (INDEPENDENT_AMBULATORY_CARE_PROVIDER_SITE_OTHER): Payer: BC Managed Care – PPO | Admitting: Adult Health

## 2017-08-17 VITALS — BP 136/78 | HR 69 | Temp 98.2°F | Resp 16 | Ht 63.0 in | Wt 234.2 lb

## 2017-08-17 DIAGNOSIS — M25511 Pain in right shoulder: Secondary | ICD-10-CM | POA: Diagnosis not present

## 2017-08-17 DIAGNOSIS — M109 Gout, unspecified: Secondary | ICD-10-CM | POA: Diagnosis not present

## 2017-08-17 DIAGNOSIS — R69 Illness, unspecified: Secondary | ICD-10-CM | POA: Diagnosis not present

## 2017-08-17 MED ORDER — PNEUMOCOCCAL VAC POLYVALENT 25 MCG/0.5ML IJ INJ: 0.5000 mL | INJECTION | INTRAMUSCULAR | 0 refills | Status: AC

## 2017-08-17 NOTE — Progress Notes (Signed)
[facility] [place], [place] [address]  Internal MEDICINE  Office Visit Note  Patient Name: Frances Greene  [id number]  [id number]  Date of Service: 08/17/2017  Chief Complaint  Patient presents with  . Back Pain    between the shoulder blades radiating   . Arm Pain    right arm tingling down to hand      HPI Pt is here for a sick visit. She states she was seen here a few months ago for right thumb pain.  She was diagnosed with gout, and started on mobic and low dose allopurinol.  She has returned today because the pain has moved from her right hand into her right scapula. She describes the pain as throbbing.  The pain does not move anywhere, and nothing she does makes it better.      Current Medication:  Outpatient Encounter Medications as of 08/17/2017  Medication Sig Note  . allopurinol (ZYLOPRIM) 300 MG tablet TAKE ONE TAB AT NIGHT FOR GOUT   . aspirin EC 325 MG tablet Take 325 mg by mouth. 01/23/2016: Received from: [place]: Take 325 mg by mouth.  Marland Kitchen atorvastatin (LIPITOR) 20 MG tablet Take 1 tablet (20 mg total) by mouth daily.   . bisoprolol-hydrochlorothiazide (ZIAC) 2.5-6.25 MG tablet Take 1 tablet by mouth 2 (two) times daily.   . Cholecalciferol (D3-1000) 1000 units tablet Take 1,000 Units by mouth daily.   . citalopram (CELEXA) 20 MG tablet Take 20 mg by mouth daily.   . cyclobenzaprine (FLEXERIL) 10 MG tablet Take 5 mg by mouth 2 (two) times daily.   . diphenhydrAMINE (ALLERGY) 25 MG tablet Take 25 mg by mouth daily.   . folic acid (FOLVITE) 970 MCG tablet Take 400 mcg by mouth daily.   . furosemide (LASIX) 40 MG tablet Take 20 mg by mouth daily.    . Garlic 2637 MG CAPS Take by mouth. 01/23/2016: Received from: [place]: Take by mouth.  Marland Kitchen glimepiride (AMARYL) 1 MG tablet Take 1 mg by mouth daily with breakfast.  01/23/2016: Received from: [facility]  . glucose blood  test strip 1 each by Other route as needed for other. Use as instructed   . hydrALAZINE (APRESOLINE) 25 MG tablet Take 25 mg by mouth 3 (three) times daily.    Marland Kitchen ketoconazole (NIZORAL) 2 % shampoo  01/23/2016: Received from: [facility]  . linaclotide (LINZESS) 145 MCG CAPS capsule Take 145 mcg by mouth daily before breakfast.   . liraglutide (VICTOZA) 18 MG/3ML SOPN Inject into the skin.   . meloxicam (MOBIC) 7.5 MG tablet Take 1 tablet (7.5 mg total) by mouth daily. Take one tab po qd for gout and arthritis   . montelukast (SINGULAIR) 10 MG tablet TAKE 1 TABLET BY MOUTH ONCE DAILY   . Omega-3 Fatty Acids (FISH OIL PO) Take by mouth. 01/23/2016: Received from: [place]: Take 1 capsule by mouth 2 (two) times daily.  . traMADol (ULTRAM) 50 MG tablet Take 1 tablet (50 mg total) by mouth 2 (two) times daily.   . Amino Acids (DAILY AMINO 6000 PO) Inject 0.6 mg into the skin daily.   . Ferrous Sulfate 134 MG TABS Take 1 tablet by mouth daily.   . polyethylene glycol (MIRALAX / GLYCOLAX) packet Take 17 g by mouth daily as needed.   . Sulfacetamide Sodium (SODIUM SULFACETAMIDE) 10 % SHAM Apply topically.   Marland Kitchen  Vitamin D, Ergocalciferol, (DRISDOL) 50000 units CAPS capsule  01/23/2016: Received from: [facility]  . [DISCONTINUED] Cholecalciferol (VITAMIN D-1000 MAX ST) 1000 units tablet Take by mouth.   . [DISCONTINUED] citalopram (CELEXA) 10 MG tablet Take 5 mg by mouth daily.   . [DISCONTINUED] citalopram (CELEXA) 20 MG tablet TAKE ONE TABLET BY MOUTH ONCE DAILY FOR  DEPRESSION   . [DISCONTINUED] folic acid (FOLVITE) 1 MG tablet Take by mouth. 01/23/2016: Received from: [place]: Take 1 mg by mouth once daily.   No facility-administered encounter medications on file as of 08/17/2017.       Medical History: Past Medical History:  Diagnosis Date  . Arthritis   . Bronchitis   . CHF (congestive heart  failure) ([place])   . Chronic kidney disease    decreased kidney function  . Diabetes mellitus without complication ([place])   . Gout   . Hypertension   . Multiple allergies   . Shortness of breath dyspnea    w/ exertion  . Sleep apnea    CPAP  . Swelling of both lower extremities   . Wheezing      Vital Signs: BP 136/78   Pulse 69   Temp 98.2 F (36.8 C)   Resp 16   Ht 5\' 3"  (1.6 m)   Wt 234 lb 3.2 oz (106.2 kg)   SpO2 92%   BMI 41.49 kg/m    Review of Systems  Constitutional: Negative for chills, fatigue and unexpected weight change.  HENT: Negative for congestion, rhinorrhea, sneezing and sore throat.   Eyes: Negative for photophobia, pain and redness.  Respiratory: Negative for cough, chest tightness and shortness of breath.   Cardiovascular: Negative for chest pain and palpitations.  Gastrointestinal: Negative for abdominal pain, constipation, diarrhea, nausea and vomiting.  Endocrine: Negative.   Genitourinary: Negative for dysuria and frequency.  Musculoskeletal: Negative for arthralgias, back pain, joint swelling and neck pain.  Skin: Negative for rash.  Allergic/Immunologic: Negative.   Neurological: Negative for tremors and numbness.  Hematological: Negative for adenopathy. Does not bruise/bleed easily.  Psychiatric/Behavioral: Negative for behavioral problems and sleep disturbance. The patient is not nervous/anxious.     Physical Exam  Constitutional: She is oriented to person, place, and time. She appears well-developed and well-nourished. No distress.  HENT:  Head: Normocephalic and atraumatic.  Mouth/Throat: Oropharynx is clear and moist. No oropharyngeal exudate.  Eyes: Pupils are equal, round, and reactive to light. EOM are normal.  Neck: Normal range of motion. Neck supple. No JVD present. No tracheal deviation present. No thyromegaly present.  Cardiovascular: Normal rate, regular rhythm and normal heart sounds. Exam reveals no gallop and no friction  rub.  No murmur heard. Pulmonary/Chest: Effort normal and breath sounds normal. No respiratory distress. She has no wheezes. She has no rales. She exhibits no tenderness.  Abdominal: Soft. There is no tenderness. There is no guarding.  Musculoskeletal: Normal range of motion.  Lymphadenopathy:    She has no cervical adenopathy.  Neurological: She is alert and oriented to person, place, and time. No cranial nerve deficit.  Skin: Skin is warm and dry. She is not diaphoretic.  Psychiatric: She has a normal mood and affect. Her behavior is normal. Judgment and thought content normal.  Nursing note and vitals reviewed.  Assessment/Plan: 1. Acute pain of right shoulder Pt has had surgery for spurs in right shoulder.  She reports the pain feels similar.  Follow up with ortho.  Use Ice/heat  for 20 minutes on, 2 hours off.  Use topical icy/hot as needed also. Continue taking mobic as prescribed.  - Ambulatory referral to Orthopedic Surgery  2. Gout, unspecified cause, unspecified chronicity, unspecified site Hand is better, pt taking medications.  Continue at this time.   General Counseling: harmoney sienkiewicz understanding of the findings of todays visit and agrees with plan of treatment. I have discussed any further diagnostic evaluation that may be needed or ordered today. We also reviewed her medications today. she has been encouraged to call the office with any questions or concerns that should arise related to todays visit.   No orders of the defined types were placed in this encounter.   No orders of the defined types were placed in this encounter.   Time spent: 25 Minutes  This patient was seen by Orson Gear AGNP-C in Collaboration with Dr Lavera Guise as a part of collaborative care agreement

## 2017-08-17 NOTE — Patient Instructions (Signed)
Shoulder Pain Many things can cause shoulder pain, including:  An injury.  Moving the arm in the same way again and again (overuse).  Joint pain (arthritis).  Follow these instructions at home: Take these actions to help with your pain:  Squeeze a soft ball or a foam pad as much as you can. This helps to prevent swelling. It also makes the arm stronger.  Take over-the-counter and prescription medicines only as told by your doctor.  If told, put ice on the area: ? Put ice in a plastic bag. ? Place a towel between your skin and the bag. ? Leave the ice on for 20 minutes, 2-3 times per day. Stop putting on ice if it does not help with the pain.  If you were given a shoulder sling or immobilizer: ? Wear it as told. ? Remove it to shower or bathe. ? Move your arm as little as possible. ? Keep your hand moving. This helps prevent swelling.  Contact a doctor if:  Your pain gets worse.  Medicine does not help your pain.  You have new pain in your arm, hand, or fingers. Get help right away if:  Your arm, hand, or fingers: ? Tingle. ? Are numb. ? Are swollen. ? Are painful. ? Turn white or blue. This information is not intended to replace advice given to you by your health care provider. Make sure you discuss any questions you have with your health care provider. Document Released: 06/23/2007 Document Revised: 08/31/2015 Document Reviewed: 04/29/2014 Elsevier Interactive Patient Education  2018 [employer]  

## 2017-08-22 ENCOUNTER — Emergency Department: Payer: Medicare HMO

## 2017-08-22 ENCOUNTER — Other Ambulatory Visit: Payer: Self-pay

## 2017-08-22 ENCOUNTER — Other Ambulatory Visit: Payer: Self-pay | Admitting: Internal Medicine

## 2017-08-22 ENCOUNTER — Encounter: Payer: Self-pay | Admitting: Adult Health

## 2017-08-22 ENCOUNTER — Emergency Department
Admission: EM | Admit: 2017-08-22 | Discharge: 2017-08-22 | Disposition: A | Discharge status: Home / Self Care | Payer: Medicare HMO | Attending: Student in an Organized Health Care Education/Training Program | Admitting: Student in an Organized Health Care Education/Training Program

## 2017-08-22 DIAGNOSIS — N289 Disorder of kidney and ureter, unspecified: Secondary | ICD-10-CM

## 2017-08-22 DIAGNOSIS — Y92410 Unspecified street and highway as the place of occurrence of the external cause: Secondary | ICD-10-CM | POA: Insufficient documentation

## 2017-08-22 DIAGNOSIS — Y9389 Activity, other specified: Secondary | ICD-10-CM | POA: Insufficient documentation

## 2017-08-22 DIAGNOSIS — E1122 Type 2 diabetes mellitus with diabetic chronic kidney disease: Secondary | ICD-10-CM | POA: Insufficient documentation

## 2017-08-22 DIAGNOSIS — Z79899 Other long term (current) drug therapy: Secondary | ICD-10-CM | POA: Insufficient documentation

## 2017-08-22 DIAGNOSIS — Z96653 Presence of artificial knee joint, bilateral: Secondary | ICD-10-CM | POA: Diagnosis not present

## 2017-08-22 DIAGNOSIS — N183 Chronic kidney disease, stage 3 (moderate): Secondary | ICD-10-CM | POA: Insufficient documentation

## 2017-08-22 DIAGNOSIS — S0990XA Unspecified injury of head, initial encounter: Secondary | ICD-10-CM | POA: Diagnosis not present

## 2017-08-22 DIAGNOSIS — Z87891 Personal history of nicotine dependence: Secondary | ICD-10-CM | POA: Insufficient documentation

## 2017-08-22 DIAGNOSIS — Y999 Unspecified external cause status: Secondary | ICD-10-CM | POA: Insufficient documentation

## 2017-08-22 DIAGNOSIS — I509 Heart failure, unspecified: Secondary | ICD-10-CM | POA: Insufficient documentation

## 2017-08-22 DIAGNOSIS — I130 Hypertensive heart and chronic kidney disease with heart failure and stage 1 through stage 4 chronic kidney disease, or unspecified chronic kidney disease: Secondary | ICD-10-CM | POA: Diagnosis not present

## 2017-08-22 DIAGNOSIS — Z7984 Long term (current) use of oral hypoglycemic drugs: Secondary | ICD-10-CM | POA: Diagnosis not present

## 2017-08-22 DIAGNOSIS — M25511 Pain in right shoulder: Secondary | ICD-10-CM | POA: Diagnosis not present

## 2017-08-22 DIAGNOSIS — I10 Essential (primary) hypertension: Secondary | ICD-10-CM | POA: Diagnosis not present

## 2017-08-22 DIAGNOSIS — S4991XA Unspecified injury of right shoulder and upper arm, initial encounter: Secondary | ICD-10-CM | POA: Diagnosis not present

## 2017-08-22 DIAGNOSIS — R51 Headache: Secondary | ICD-10-CM | POA: Diagnosis not present

## 2017-08-22 DIAGNOSIS — S299XXA Unspecified injury of thorax, initial encounter: Secondary | ICD-10-CM | POA: Diagnosis not present

## 2017-08-22 DIAGNOSIS — R1010 Upper abdominal pain, unspecified: Secondary | ICD-10-CM | POA: Diagnosis not present

## 2017-08-22 MED ORDER — ACETAMINOPHEN 500 MG PO TABS
1000.0000 mg | ORAL_TABLET | Freq: Once | ORAL | Status: AC
  Administered 2017-08-22: 1000 mg via ORAL
  Filled 2017-08-22: qty 2

## 2017-08-22 MED ORDER — LIDOCAINE 5 % EX PTCH
1.0000 | MEDICATED_PATCH | CUTANEOUS | Status: DC
  Administered 2017-08-22: 1 via TRANSDERMAL
  Filled 2017-08-22: qty 1

## 2017-08-22 MED ORDER — LIDOCAINE 5 % EX PTCH: 1.0000 | MEDICATED_PATCH | Freq: Two times a day (BID) | CUTANEOUS | 0 refills | Status: DC

## 2017-08-22 NOTE — ED Notes (Signed)
Pt able to ambulate in the hallway without difficulty and a steady gait.

## 2017-08-22 NOTE — ED Notes (Signed)
Pt provided with water and meal tray.
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
## 2017-08-22 NOTE — ED Notes (Signed)
Patient transported to CT 

## 2017-08-22 NOTE — ED Notes (Signed)
Patient transported to X-ray 

## 2017-08-23 DIAGNOSIS — M5412 Radiculopathy, cervical region: Secondary | ICD-10-CM | POA: Diagnosis not present

## 2017-08-23 DIAGNOSIS — R69 Illness, unspecified: Secondary | ICD-10-CM | POA: Diagnosis not present

## 2017-08-23 DIAGNOSIS — Z6841 Body Mass Index (BMI) 40.0 and over, adult: Secondary | ICD-10-CM | POA: Diagnosis not present

## 2017-08-24 NOTE — Telephone Encounter (Signed)
done

## 2017-08-25 DIAGNOSIS — M6281 Muscle weakness (generalized): Secondary | ICD-10-CM | POA: Diagnosis not present

## 2017-08-25 DIAGNOSIS — M5412 Radiculopathy, cervical region: Secondary | ICD-10-CM | POA: Diagnosis not present

## 2017-08-29 DIAGNOSIS — M5412 Radiculopathy, cervical region: Secondary | ICD-10-CM | POA: Diagnosis not present

## 2017-08-29 DIAGNOSIS — M6281 Muscle weakness (generalized): Secondary | ICD-10-CM | POA: Diagnosis not present

## 2017-08-31 ENCOUNTER — Ambulatory Visit
Admission: RE | Admit: 2017-08-31 | Discharge: 2017-08-31 | Disposition: A | Discharge status: Home / Self Care | Payer: Medicare HMO | Source: Ambulatory Visit | Attending: Internal Medicine | Admitting: Internal Medicine

## 2017-08-31 DIAGNOSIS — Z1231 Encounter for screening mammogram for malignant neoplasm of breast: Secondary | ICD-10-CM | POA: Insufficient documentation

## 2017-08-31 DIAGNOSIS — Z1239 Encounter for other screening for malignant neoplasm of breast: Secondary | ICD-10-CM

## 2017-09-02 ENCOUNTER — Ambulatory Visit: Payer: BC Managed Care – PPO

## 2017-09-02 ENCOUNTER — Ambulatory Visit (INDEPENDENT_AMBULATORY_CARE_PROVIDER_SITE_OTHER): Payer: BC Managed Care – PPO

## 2017-09-02 DIAGNOSIS — N289 Disorder of kidney and ureter, unspecified: Secondary | ICD-10-CM | POA: Diagnosis not present

## 2017-09-02 DIAGNOSIS — N186 End stage renal disease: Principal | ICD-10-CM

## 2017-09-02 DIAGNOSIS — R944 Abnormal results of kidney function studies: Secondary | ICD-10-CM | POA: Diagnosis not present

## 2017-09-02 DIAGNOSIS — E1122 Type 2 diabetes mellitus with diabetic chronic kidney disease: Secondary | ICD-10-CM

## 2017-09-05 DIAGNOSIS — M6281 Muscle weakness (generalized): Secondary | ICD-10-CM | POA: Diagnosis not present

## 2017-09-05 DIAGNOSIS — M5412 Radiculopathy, cervical region: Secondary | ICD-10-CM | POA: Diagnosis not present

## 2017-09-06 DIAGNOSIS — E1369 Other specified diabetes mellitus with other specified complication: Secondary | ICD-10-CM | POA: Diagnosis not present

## 2017-09-06 DIAGNOSIS — Z6841 Body Mass Index (BMI) 40.0 and over, adult: Secondary | ICD-10-CM | POA: Diagnosis not present

## 2017-09-06 DIAGNOSIS — M5030 Other cervical disc degeneration, unspecified cervical region: Secondary | ICD-10-CM | POA: Diagnosis not present

## 2017-09-06 DIAGNOSIS — M5412 Radiculopathy, cervical region: Secondary | ICD-10-CM | POA: Diagnosis not present

## 2017-09-07 ENCOUNTER — Encounter: Payer: Self-pay | Admitting: Nurse Practitioner

## 2017-09-07 ENCOUNTER — Ambulatory Visit (INDEPENDENT_AMBULATORY_CARE_PROVIDER_SITE_OTHER): Payer: BC Managed Care – PPO | Admitting: Nurse Practitioner

## 2017-09-07 VITALS — BP 120/80 | HR 69 | Resp 16 | Ht 63.0 in | Wt 236.0 lb

## 2017-09-07 DIAGNOSIS — I10 Essential (primary) hypertension: Secondary | ICD-10-CM

## 2017-09-07 DIAGNOSIS — E1165 Type 2 diabetes mellitus with hyperglycemia: Secondary | ICD-10-CM

## 2017-09-07 DIAGNOSIS — M5412 Radiculopathy, cervical region: Secondary | ICD-10-CM | POA: Diagnosis not present

## 2017-09-07 DIAGNOSIS — M25511 Pain in right shoulder: Secondary | ICD-10-CM | POA: Diagnosis not present

## 2017-09-07 DIAGNOSIS — M6281 Muscle weakness (generalized): Secondary | ICD-10-CM | POA: Diagnosis not present

## 2017-09-07 NOTE — Progress Notes (Signed)
[facility] [place], [place] [address]  Internal MEDICINE  Office Visit Note  Patient Name: Frances Greene  [id number]  [id number]  Date of Service: 09/18/2017  Chief Complaint  Patient presents with  . Hypertension  . Osteoarthritis    She continues to have some right shoulder pain/tenderness. This has improved since her last visit. Taking meloxicam as needed to reduce pain and inflammation. ROM and strength have improved while taking NSAID. She did have an x-ray of her shoulder since her last visit which did show some osteoarthrotic changes, but was without other abnormalities.  Hypertension  This is a chronic problem. The current episode started more than 1 year ago. The problem is unchanged. The problem is controlled. Pertinent negatives include no chest pain, headaches, neck pain, palpitations or shortness of breath. Agents associated with hypertension include NSAIDs. Risk factors for coronary artery disease include dyslipidemia, diabetes mellitus, family history and post-menopausal state. Past treatments include diuretics and beta blockers. The current treatment provides moderate improvement. There are no compliance problems.        Current Medication: Outpatient Encounter Medications as of 09/07/2017  Medication Sig Note  . allopurinol (ZYLOPRIM) 300 MG tablet TAKE ONE TAB AT NIGHT FOR GOUT   . Amino Acids (DAILY AMINO 6000 PO) Inject 0.6 mg into the skin daily.   Marland Kitchen aspirin EC 325 MG tablet Take 325 mg by mouth. 01/23/2016: Received from: [place]: Take 325 mg by mouth.  Marland Kitchen atorvastatin (LIPITOR) 20 MG tablet Take 1 tablet (20 mg total) by mouth daily.   . bisoprolol-hydrochlorothiazide (ZIAC) 2.5-6.25 MG tablet Take 1 tablet by mouth 2 (two) times daily.   . Cholecalciferol (D3-1000) 1000 units tablet Take 1,000 Units by mouth daily.   . citalopram (CELEXA) 20 MG tablet Take 20 mg by mouth daily.   . cyclobenzaprine (FLEXERIL) 10  MG tablet Take 5 mg by mouth 2 (two) times daily.   . diphenhydrAMINE (ALLERGY) 25 MG tablet Take 25 mg by mouth daily.   . Ferrous Sulfate 134 MG TABS Take 1 tablet by mouth daily.   . folic acid (FOLVITE) 867 MCG tablet Take 400 mcg by mouth daily.   . furosemide (LASIX) 40 MG tablet Take 20 mg by mouth daily.    . Garlic 6195 MG CAPS Take by mouth. 01/23/2016: Received from: [place]: Take by mouth.  Marland Kitchen glimepiride (AMARYL) 1 MG tablet Take 1 mg by mouth daily with breakfast.  01/23/2016: Received from: [facility]  . glucose blood test strip 1 each by Other route as needed for other. Use as instructed   . hydrALAZINE (APRESOLINE) 25 MG tablet Take 25 mg by mouth 3 (three) times daily.    Marland Kitchen ketoconazole (NIZORAL) 2 % shampoo  01/23/2016: Received from: [facility]  . lidocaine (LIDODERM) 5 % Place 1 patch onto the skin every 12 (twelve) hours. Remove & Discard patch within 12 hours or as directed by MD   . linaclotide (LINZESS) 145 MCG CAPS capsule Take 145 mcg by mouth daily before breakfast.   . liraglutide (VICTOZA) 18 MG/3ML SOPN Inject into the skin.   . meloxicam (MOBIC) 7.5 MG tablet Take 1 tablet (7.5 mg total) by mouth daily. Take one tab po qd for gout and arthritis   . montelukast (SINGULAIR) 10 MG tablet TAKE 1 TABLET BY MOUTH ONCE DAILY   . Omega-3 Fatty Acids (FISH OIL PO) Take by mouth.  01/23/2016: Received from: [place]: Take 1 capsule by mouth 2 (two) times daily.  . polyethylene glycol (MIRALAX / GLYCOLAX) packet Take 17 g by mouth daily as needed.   . Sulfacetamide Sodium (SODIUM SULFACETAMIDE) 10 % SHAM Apply topically.   . traMADol (ULTRAM) 50 MG tablet Take 1 tablet (50 mg total) by mouth 2 (two) times daily.   . Vitamin D, Ergocalciferol, (DRISDOL) 50000 units CAPS capsule  01/23/2016: Received from: [place]   No facility-administered encounter medications  on file as of 09/07/2017.     Surgical History: Past Surgical History:  Procedure Laterality Date  . ABDOMINAL HYSTERECTOMY    . BREAST BIOPSY Left 1989   neg  . CATARACT EXTRACTION W/PHACO Right 05/21/2015   Procedure: CATARACT EXTRACTION PHACO AND INTRAOCULAR LENS PLACEMENT (IOC);  Surgeon: Milus Height, MD;  Location: [facility] ORS;  Service: Ophthalmology;  Laterality: Right;  [place]: AP%: 10.0 CDE: 9.53  . COLONOSCOPY    . JOINT REPLACEMENT Bilateral    knee  . PARS PLANA VITRECTOMY Right 05/21/2015   Procedure: PARS PLANA VITRECTOMY WITH 25 GAUGE;  Surgeon: Milus Height, MD;  Location: [facility] ORS;  Service: Ophthalmology;  Laterality: Right;  Lot # [id number] H Endo laser: Watts: 300 Pulse Duration: 200 Total Pulses:143   . shoulder surgery      Medical History: Past Medical History:  Diagnosis Date  . Arthritis   . Bronchitis   . CHF (congestive heart failure) ([place])   . Chronic kidney disease    decreased kidney function  . Diabetes mellitus without complication ([place])   . Gout   . Hypertension   . Multiple allergies   . Shortness of breath dyspnea    w/ exertion  . Sleep apnea    CPAP  . Swelling of both lower extremities   . Wheezing     Family History: Family History  Problem Relation Age of Onset  . Breast cancer Cousin   . Breast cancer Other     Social History   Socioeconomic History  . Marital status: Divorced    Spouse name: Not on file  . Number of children: Not on file  . Years of education: Not on file  . Highest education level: Not on file  Occupational History  . Not on file  Social Needs  . Financial resource strain: Not on file  . Food insecurity:    Worry: Not on file    Inability: Not on file  . Transportation needs:    Medical: Not on file    Non-medical: Not on file  Tobacco Use  . Smoking status: Former Smoker    Types: Cigarettes  . Smokeless tobacco: Never Used  . Tobacco comment: 4 cigarettes quit 20 years ago  Substance and  Sexual Activity  . Alcohol use: No  . Drug use: Not on file  . Sexual activity: Not on file  Lifestyle  . Physical activity:    Days per week: Not on file    Minutes per session: Not on file  . Stress: Not on file  Relationships  . Social connections:    Talks on phone: Not on file    Gets together: Not on file    Attends religious service: Not on file    Active member of club or organization: Not on file    Attends meetings of clubs or organizations: Not on file    Relationship status: Not on file  . Intimate partner violence:  Fear of current or ex partner: Not on file    Emotionally abused: Not on file    Physically abused: Not on file    Forced sexual activity: Not on file  Other Topics Concern  . Not on file  Social History Narrative  . Not on file      Review of Systems  Constitutional: Negative for activity change, appetite change, chills, fatigue and unexpected weight change.  HENT: Negative for congestion, nosebleeds, postnasal drip, rhinorrhea, sneezing and sore throat.   Eyes: Negative.  Negative for photophobia, pain and redness.  Respiratory: Negative for cough, chest tightness, shortness of breath and wheezing.   Cardiovascular: Negative for chest pain and palpitations.  Gastrointestinal: Negative for abdominal pain, constipation, diarrhea, nausea and vomiting.  Endocrine: Negative for cold intolerance, heat intolerance, polydipsia, polyphagia and polyuria.       Blood sugars doing well   Genitourinary: Negative.  Negative for dysuria and frequency.  Musculoskeletal: Positive for arthralgias. Negative for back pain, joint swelling and neck pain.       Right shoulder pain.  This has improved since her last visit.   Skin: Negative for rash.  Allergic/Immunologic: Negative.  Negative for environmental allergies.  Neurological: Negative for dizziness, tremors, numbness and headaches.  Hematological: Negative for adenopathy. Does not bruise/bleed easily.   Psychiatric/Behavioral: Negative for behavioral problems and sleep disturbance. The patient is not nervous/anxious.     Today's Vitals   09/07/17 0955  BP: 120/80  Pulse: 69  Resp: 16  SpO2: 96%  Weight: 236 lb (107 kg)  Height: 5\' 3"  (1.6 m)    Physical Exam  Constitutional: She is oriented to person, place, and time. She appears well-developed and well-nourished. No distress.  HENT:  Head: Normocephalic and atraumatic.  Nose: Nose normal.  Mouth/Throat: Oropharynx is clear and moist. No oropharyngeal exudate.  Eyes: Pupils are equal, round, and reactive to light. Conjunctivae and EOM are normal.  Neck: Normal range of motion. Neck supple. No JVD present. No tracheal deviation present. No thyromegaly present.  Cardiovascular: Normal rate, regular rhythm and normal heart sounds. Exam reveals no gallop and no friction rub.  No murmur heard. Pulmonary/Chest: Effort normal and breath sounds normal. No respiratory distress. She has no wheezes. She has no rales. She exhibits no tenderness.  Abdominal: Soft. Bowel sounds are normal. There is no tenderness. There is no guarding.  Musculoskeletal: Normal range of motion.  Some right shoulder tenderness present, especially at [facility] joint. ROM and strength slightly limited due to pain, but better than prior visits.   Lymphadenopathy:    She has no cervical adenopathy.  Neurological: She is alert and oriented to person, place, and time. No cranial nerve deficit.  Skin: Skin is warm and dry. Capillary refill takes less than 2 seconds. She is not diaphoretic.  Psychiatric: She has a normal mood and affect. Her behavior is normal. Judgment and thought content normal.  Nursing note and vitals reviewed.  Assessment/Plan: 1. Essential hypertension, benign Well controlled. Reviewed renal ultrasound which showed no gross abnormalities or deformities. Continue bp medication as prescribed.   2. Uncontrolled type 2 diabetes mellitus with hyperglycemia  (HCC) Recent HgbA1c 6.9. Continue diabetic medication as prescribed .  3. Acute pain of right shoulder Reviewed x-ray of right shoulder, showing osteoarthritis of right AC joint. ROM and strength improving. Continue using meloxicam as needed and as prescribed. Rest and ice the shoulder when needed. Will refer to orthopedics as indicated for worsening pain/inflammation.   General  Counseling: aloise copus understanding of the findings of todays visit and agrees with plan of treatment. I have discussed any further diagnostic evaluation that may be needed or ordered today. We also reviewed her medications today. she has been encouraged to call the office with any questions or concerns that should arise related to todays visit.  Hypertension Counseling:   The following hypertensive lifestyle modification were recommended and discussed:  1. Limiting alcohol intake to less than 1 oz/day of ethanol:(24 oz of beer or 8 oz of wine or 2 oz of 100-proof whiskey). 2. Take baby ASA 81 mg daily. 3. Importance of regular aerobic exercise and losing weight. 4. Reduce dietary saturated fat and cholesterol intake for overall cardiovascular health. 5. Maintaining adequate dietary potassium, calcium, and magnesium intake. 6. Regular monitoring of the blood pressure. 7. Reduce sodium intake to less than 100 mmol/day (less than 2.3 gm of sodium or less than 6 gm of sodium choride)   This patient was seen by [place] with Dr Lavera Guise as a part of collaborative care agreement    Time spent: 43 Minutes      Dr Lavera Guise Internal medicine

## 2017-09-18 DIAGNOSIS — E1165 Type 2 diabetes mellitus with hyperglycemia: Secondary | ICD-10-CM | POA: Insufficient documentation

## 2017-09-18 DIAGNOSIS — M25511 Pain in right shoulder: Secondary | ICD-10-CM | POA: Insufficient documentation

## 2017-09-29 ENCOUNTER — Other Ambulatory Visit: Payer: Self-pay | Admitting: Internal Medicine

## 2017-10-10 ENCOUNTER — Ambulatory Visit: Payer: Self-pay | Admitting: Internal Medicine

## 2017-10-11 DIAGNOSIS — R69 Illness, unspecified: Secondary | ICD-10-CM | POA: Diagnosis not present

## 2017-10-13 ENCOUNTER — Other Ambulatory Visit: Payer: Self-pay | Admitting: Internal Medicine

## 2017-10-19 ENCOUNTER — Encounter: Payer: Self-pay | Admitting: Adult Health

## 2017-10-19 ENCOUNTER — Ambulatory Visit (INDEPENDENT_AMBULATORY_CARE_PROVIDER_SITE_OTHER): Payer: BC Managed Care – PPO | Admitting: Adult Health

## 2017-10-19 VITALS — BP 130/80 | HR 71 | Temp 98.3°F | Resp 16 | Ht 62.0 in | Wt 242.0 lb

## 2017-10-19 DIAGNOSIS — J988 Other specified respiratory disorders: Secondary | ICD-10-CM | POA: Diagnosis not present

## 2017-10-19 DIAGNOSIS — R05 Cough: Secondary | ICD-10-CM

## 2017-10-19 DIAGNOSIS — R059 Cough, unspecified: Secondary | ICD-10-CM

## 2017-10-19 DIAGNOSIS — I10 Essential (primary) hypertension: Secondary | ICD-10-CM | POA: Diagnosis not present

## 2017-10-19 DIAGNOSIS — J22 Unspecified acute lower respiratory infection: Secondary | ICD-10-CM

## 2017-10-19 MED ORDER — METHYLPREDNISOLONE ACETATE 80 MG/ML IJ SUSP
80.0000 mg | Freq: Once | INTRAMUSCULAR | Status: AC
  Administered 2017-10-19: 40 mg via INTRAMUSCULAR

## 2017-10-19 MED ORDER — LEVOFLOXACIN 500 MG PO TABS
500.0000 mg | ORAL_TABLET | Freq: Every day | ORAL | 0 refills | Status: DC
Start: 2017-10-19 — End: 2017-11-14

## 2017-10-19 MED ORDER — METHYLPREDNISOLONE ACETATE 40 MG/ML IJ SUSP: 40.0000 mg | Freq: Once | INTRAMUSCULAR | Status: DC

## 2017-10-19 NOTE — Patient Instructions (Signed)
Upper Respiratory Infection, Adult Most upper respiratory infections (URIs) are caused by a virus. A URI affects the nose, throat, and upper air passages. The most common type of URI is often called "the common cold." Follow these instructions at home:  Take medicines only as told by your doctor.  Gargle warm saltwater or take cough drops to comfort your throat as told by your doctor.  Use a warm mist humidifier or inhale steam from a shower to increase air moisture. This may make it easier to breathe.  Drink enough fluid to keep your pee (urine) clear or pale yellow.  Eat soups and other clear broths.  Have a healthy diet.  Rest as needed.  Go back to work when your fever is gone or your doctor says it is okay. ? You may need to stay home longer to avoid giving your URI to others. ? You can also wear a face mask and wash your hands often to prevent spread of the virus.  Use your inhaler more if you have asthma.  Do not use any tobacco products, including cigarettes, chewing tobacco, or electronic cigarettes. If you need help quitting, ask your doctor. Contact a doctor if:  You are getting worse, not better.  Your symptoms are not helped by medicine.  You have chills.  You are getting more short of breath.  You have brown or red mucus.  You have yellow or brown discharge from your nose.  You have pain in your face, especially when you bend forward.  You have a fever.  You have puffy (swollen) neck glands.  You have pain while swallowing.  You have white areas in the back of your throat. Get help right away if:  You have very bad or constant: ? Headache. ? Ear pain. ? Pain in your forehead, behind your eyes, and over your cheekbones (sinus pain). ? Chest pain.  You have long-lasting (chronic) lung disease and any of the following: ? Wheezing. ? Long-lasting cough. ? Coughing up blood. ? A change in your usual mucus.  You have a stiff neck.  You have  changes in your: ? Vision. ? Hearing. ? Thinking. ? Mood. This information is not intended to replace advice given to you by your health care provider. Make sure you discuss any questions you have with your health care provider. Document Released: 06/23/2007 Document Revised: 09/07/2015 Document Reviewed: 04/11/2013 Elsevier Interactive Patient Education  2018 [employer]  

## 2017-10-19 NOTE — Progress Notes (Addendum)
[facility] [place], [place] [address]  Internal MEDICINE  Office Visit Note  Patient Name: Frances Greene  [id number]  [id number]  Date of Service: 10/24/2017  Chief Complaint  Patient presents with  . Diabetes  . Hypertension  . Cough  . Sinusitis   HPI Pt is here for a sick visit.  She reports 3 weeks of of chest congestion and cough. She has intermittent PND, but does not have sinus congestion. She did report sore throat at times.  She has been taking coracidin and allegra. She denies fever, or sob.  She has a history of DM, HTN, and sinus issues.   Current Medication:  Outpatient Encounter Medications as of 10/19/2017  Medication Sig Note  . allopurinol (ZYLOPRIM) 300 MG tablet TAKE ONE TAB AT NIGHT FOR GOUT   . Amino Acids (DAILY AMINO 6000 PO) Inject 0.6 mg into the skin daily.   Marland Kitchen aspirin EC 325 MG tablet Take 325 mg by mouth. 01/23/2016: Received from: [place]: Take 325 mg by mouth.  Marland Kitchen atorvastatin (LIPITOR) 20 MG tablet Take 1 tablet (20 mg total) by mouth daily.   . bisoprolol-hydrochlorothiazide (ZIAC) 2.5-6.25 MG tablet Take 1 tablet by mouth 2 (two) times daily.   . Cholecalciferol (D3-1000) 1000 units tablet Take 1,000 Units by mouth daily.   . citalopram (CELEXA) 20 MG tablet Take 20 mg by mouth daily.   . cyclobenzaprine (FLEXERIL) 10 MG tablet Take 5 mg by mouth 2 (two) times daily.   . diphenhydrAMINE (ALLERGY) 25 MG tablet Take 25 mg by mouth daily.   . Ferrous Sulfate 134 MG TABS Take 1 tablet by mouth daily.   . folic acid (FOLVITE) 500 MCG tablet Take 400 mcg by mouth daily.   . furosemide (LASIX) 40 MG tablet TAKE 1 TABLET BY MOUTH ONCE DAILY AND  TAKE  XTRA  TAB  IF  NEEDED   . Garlic 9381 MG CAPS Take by mouth. 01/23/2016: Received from: [place]: Take by mouth.  Marland Kitchen glimepiride (AMARYL) 1 MG tablet Take 1 mg by mouth daily with breakfast.  01/23/2016: Received from: [facility]  . glucose blood test strip 1 each by Other route as needed for other. Use as instructed   . hydrALAZINE (APRESOLINE) 25 MG tablet Take 25 mg by mouth 3 (three) times daily.    Marland Kitchen ketoconazole (NIZORAL) 2 % shampoo  01/23/2016: Received from: [facility]  . lidocaine (LIDODERM) 5 % Place 1 patch onto the skin every 12 (twelve) hours. Remove & Discard patch within 12 hours or as directed by MD   . linaclotide (LINZESS) 145 MCG CAPS capsule Take 145 mcg by mouth daily before breakfast.   . meloxicam (MOBIC) 7.5 MG tablet Take 1 tablet (7.5 mg total) by mouth daily. Take one tab po qd for gout and arthritis   . montelukast (SINGULAIR) 10 MG tablet TAKE 1 TABLET BY MOUTH ONCE DAILY   . Omega-3 Fatty Acids (FISH OIL PO) Take by mouth. 01/23/2016: Received from: [place]: Take 1 capsule by mouth 2 (two) times daily.  . polyethylene glycol (MIRALAX / GLYCOLAX) packet Take 17 g by mouth daily as needed.   . Sulfacetamide Sodium (SODIUM SULFACETAMIDE) 10 % SHAM Apply topically.   . traMADol (ULTRAM) 50 MG tablet Take 1 tablet (50 mg total) by mouth 2 (two) times daily.   Marland Kitchen VICTOZA 18 MG/3ML SOPN  INJECT 1.8 MG ONCE DAILY SUBCUTANEOUSLY   . Vitamin D, Ergocalciferol, (DRISDOL) 50000 units CAPS capsule  01/23/2016: Received from: [facility]  . levofloxacin (LEVAQUIN) 500 MG tablet Take 1 tablet (500 mg total) by mouth daily.   . [EXPIRED] methylPREDNISolone acetate (DEPO-MEDROL) injection 80 mg    . [DISCONTINUED] methylPREDNISolone acetate (DEPO-MEDROL) injection 40 mg     No facility-administered encounter medications on file as of 10/19/2017.       Medical History: Past Medical History:  Diagnosis Date  . Arthritis   . Bronchitis   . CHF (congestive heart failure) ([place])   . Chronic kidney disease    decreased kidney function  . Diabetes mellitus without complication ([place])   . Gout   . Hypertension   .  Multiple allergies   . Shortness of breath dyspnea    w/ exertion  . Sleep apnea    CPAP  . Swelling of both lower extremities   . Wheezing      Vital Signs: BP 130/80   Pulse 71   Temp 98.3 F (36.8 C)   Resp 16   Ht 5\' 2"  (1.575 m)   Wt 242 lb (109.8 kg)   SpO2 95%   BMI 44.26 kg/m    Review of Systems  Constitutional: Positive for fatigue. Negative for chills and unexpected weight change.  HENT: Positive for postnasal drip, rhinorrhea and sore throat. Negative for congestion and sneezing.   Eyes: Negative for photophobia, pain and redness.  Respiratory: Positive for cough. Negative for chest tightness and shortness of breath.   Cardiovascular: Negative for chest pain and palpitations.  Gastrointestinal: Negative for abdominal pain, constipation, diarrhea, nausea and vomiting.  Endocrine: Negative.   Genitourinary: Negative for dysuria and frequency.  Musculoskeletal: Negative for arthralgias, back pain, joint swelling and neck pain.  Skin: Negative for rash.  Allergic/Immunologic: Negative.   Neurological: Negative for tremors and numbness.  Hematological: Negative for adenopathy. Does not bruise/bleed easily.  Psychiatric/Behavioral: Negative for behavioral problems and sleep disturbance. The patient is not nervous/anxious.     Physical Exam  Constitutional: She is oriented to person, place, and time. She appears well-developed and well-nourished. No distress.  HENT:  Head: Normocephalic and atraumatic.  Mouth/Throat: Oropharynx is clear and moist. No oropharyngeal exudate.  Eyes: Pupils are equal, round, and reactive to light. EOM are normal.  Neck: Normal range of motion. Neck supple. No JVD present. No tracheal deviation present. No thyromegaly present.  Cardiovascular: Normal rate, regular rhythm and normal heart sounds. Exam reveals no gallop and no friction rub.  No murmur heard. Pulmonary/Chest: Effort normal and breath sounds normal. No respiratory  distress. She has no wheezes. She has no rales. She exhibits no tenderness.  Abdominal: Soft. There is no tenderness. There is no guarding.  Musculoskeletal: Normal range of motion.  Lymphadenopathy:    She has no cervical adenopathy.  Neurological: She is alert and oriented to person, place, and time. No cranial nerve deficit.  Skin: Skin is warm and dry. She is not diaphoretic.  Psychiatric: She has a normal mood and affect. Her behavior is normal. Judgment and thought content normal.  Nursing note and vitals reviewed.  Assessment/Plan: 1. Bacterial lower respiratory infection Pt had sinus drainage that has now become chest congestion. A course of Levaquin is prescribed with instructions to eat when taking.   - levofloxacin (LEVAQUIN) 500 MG tablet; Take 1 tablet (500 mg total) by mouth daily.  Dispense: 10 tablet; Refill: 0  2.  Essential hypertension, benign Stable, continue current management.   3. Cough Depo-Medrol 40mg  given.   General Counseling: maretta overdorf understanding of the findings of todays visit and agrees with plan of treatment. I have discussed any further diagnostic evaluation that may be needed or ordered today. We also reviewed her medications today. she has been encouraged to call the office with any questions or concerns that should arise related to todays visit.   Meds ordered this encounter  Medications  . levofloxacin (LEVAQUIN) 500 MG tablet    Sig: Take 1 tablet (500 mg total) by mouth daily.    Dispense:  10 tablet    Refill:  0  . DISCONTD: methylPREDNISolone acetate (DEPO-MEDROL) injection 40 mg  . methylPREDNISolone acetate (DEPO-MEDROL) injection 80 mg    Time spent: 25 Minutes  This patient was seen by Orson Gear AGNP-C in Collaboration with Dr Lavera Guise as a part of collaborative care agreement

## 2017-11-01 ENCOUNTER — Encounter: Payer: Self-pay | Admitting: Internal Medicine

## 2017-11-01 ENCOUNTER — Ambulatory Visit: Payer: BC Managed Care – PPO | Admitting: Internal Medicine

## 2017-11-01 VITALS — BP 132/64 | HR 69 | Resp 16 | Ht 62.0 in | Wt 237.0 lb

## 2017-11-01 DIAGNOSIS — J209 Acute bronchitis, unspecified: Secondary | ICD-10-CM | POA: Diagnosis not present

## 2017-11-01 DIAGNOSIS — G4733 Obstructive sleep apnea (adult) (pediatric): Secondary | ICD-10-CM | POA: Diagnosis not present

## 2017-11-01 DIAGNOSIS — N183 Chronic kidney disease, stage 3 unspecified: Secondary | ICD-10-CM

## 2017-11-01 DIAGNOSIS — J440 Chronic obstructive pulmonary disease with acute lower respiratory infection: Secondary | ICD-10-CM

## 2017-11-01 NOTE — Patient Instructions (Signed)

## 2017-11-01 NOTE — Progress Notes (Signed)
[facility] [place], [place] [address]  Pulmonary Sleep Medicine   Office Visit Note  Patient Name: Frances Greene DOB: April 05, 1945 MRN [id number]  Date of Service: 11/01/2017  Complaints/HPI: Doing well has had no falre up of her breathing. No admissions noted. Patient had PFT done looks within normal limits. She does not take inhalers at this time. Recently treated for a bronchitis now better she denies having any chest pain no palpitations.  She states that she has occasional wheezing noted but overall she is doing much better since her treatment with antibiotics.  ROS  General: (-) fever, (-) chills, (-) night sweats, (-) weakness Skin: (-) rashes, (-) itching,. Eyes: (-) visual changes, (-) redness, (-) itching. Nose and Sinuses: (-) nasal stuffiness or itchiness, (-) postnasal drip, (-) nosebleeds, (-) sinus trouble. Mouth and Throat: (-) sore throat, (-) hoarseness. Neck: (-) swollen glands, (-) enlarged thyroid, (-) neck pain. Respiratory: - cough, (-) bloody sputum, - shortness of breath, - wheezing. Cardiovascular: - ankle swelling, (-) chest pain. Lymphatic: (-) lymph node enlargement. Neurologic: (-) numbness, (-) tingling. Psychiatric: (-) anxiety, (-) depression   Current Medication: Outpatient Encounter Medications as of 11/01/2017  Medication Sig Note  . allopurinol (ZYLOPRIM) 300 MG tablet TAKE ONE TAB AT NIGHT FOR GOUT   . Amino Acids (DAILY AMINO 6000 PO) Inject 0.6 mg into the skin daily.   Marland Kitchen aspirin EC 325 MG tablet Take 325 mg by mouth. 01/23/2016: Received from: [place]: Take 325 mg by mouth.  Marland Kitchen atorvastatin (LIPITOR) 20 MG tablet Take 1 tablet (20 mg total) by mouth daily.   . bisoprolol-hydrochlorothiazide (ZIAC) 2.5-6.25 MG tablet Take 1 tablet by mouth 2 (two) times daily.   . Cholecalciferol (D3-1000) 1000 units tablet Take 1,000 Units by mouth daily.   . citalopram (CELEXA) 20 MG tablet Take 20 mg by  mouth daily.   . cyclobenzaprine (FLEXERIL) 10 MG tablet Take 5 mg by mouth 2 (two) times daily.   . diphenhydrAMINE (ALLERGY) 25 MG tablet Take 25 mg by mouth daily.   . Ferrous Sulfate 134 MG TABS Take 1 tablet by mouth daily.   . folic acid (FOLVITE) 546 MCG tablet Take 400 mcg by mouth daily.   . furosemide (LASIX) 40 MG tablet TAKE 1 TABLET BY MOUTH ONCE DAILY AND  TAKE  XTRA  TAB  IF  NEEDED   . Garlic 5035 MG CAPS Take by mouth. 01/23/2016: Received from: [place]: Take by mouth.  Marland Kitchen glimepiride (AMARYL) 1 MG tablet Take 1 mg by mouth daily with breakfast.  01/23/2016: Received from: [facility]  . glucose blood test strip 1 each by Other route as needed for other. Use as instructed   . hydrALAZINE (APRESOLINE) 25 MG tablet Take 25 mg by mouth 3 (three) times daily.    Marland Kitchen ketoconazole (NIZORAL) 2 % shampoo  01/23/2016: Received from: [facility]  . levofloxacin (LEVAQUIN) 500 MG tablet Take 1 tablet (500 mg total) by mouth daily. (Patient not taking: Reported on 11/01/2017)   . lidocaine (LIDODERM) 5 % Place 1 patch onto the skin every 12 (twelve) hours. Remove & Discard patch within 12 hours or as directed by MD   . linaclotide (LINZESS) 145 MCG CAPS capsule Take 145 mcg by mouth daily before breakfast.   . meloxicam (MOBIC) 7.5 MG tablet Take 1 tablet (7.5 mg total) by mouth daily. Take one tab po qd  for gout and arthritis   . montelukast (SINGULAIR) 10 MG tablet TAKE 1 TABLET BY MOUTH ONCE DAILY   . Omega-3 Fatty Acids (FISH OIL PO) Take by mouth. 01/23/2016: Received from: [place]: Take 1 capsule by mouth 2 (two) times daily.  . polyethylene glycol (MIRALAX / GLYCOLAX) packet Take 17 g by mouth daily as needed.   . Sulfacetamide Sodium (SODIUM SULFACETAMIDE) 10 % SHAM Apply topically.   . traMADol (ULTRAM) 50 MG tablet Take 1 tablet (50 mg total) by mouth 2 (two) times daily.   Marland Kitchen VICTOZA 18  MG/3ML SOPN INJECT 1.8 MG ONCE DAILY SUBCUTANEOUSLY   . Vitamin D, Ergocalciferol, (DRISDOL) 50000 units CAPS capsule  01/23/2016: Received from: [place]   No facility-administered encounter medications on file as of 11/01/2017.     Surgical History: Past Surgical History:  Procedure Laterality Date  . ABDOMINAL HYSTERECTOMY    . BREAST BIOPSY Left 1989   neg  . CATARACT EXTRACTION W/PHACO Right 05/21/2015   Procedure: CATARACT EXTRACTION PHACO AND INTRAOCULAR LENS PLACEMENT (IOC);  Surgeon: Milus Height, MD;  Location: [facility] ORS;  Service: Ophthalmology;  Laterality: Right;  [place]: AP%: 10.0 CDE: 9.53  . COLONOSCOPY    . JOINT REPLACEMENT Bilateral    knee  . PARS PLANA VITRECTOMY Right 05/21/2015   Procedure: PARS PLANA VITRECTOMY WITH 25 GAUGE;  Surgeon: Milus Height, MD;  Location: [facility] ORS;  Service: Ophthalmology;  Laterality: Right;  Lot # [id number] Endo laser: [place]: 300 Pulse Duration: 200 Total Pulses:143   . shoulder surgery      Medical History: Past Medical History:  Diagnosis Date  . Arthritis   . Bronchitis   . CHF (congestive heart failure) ([place])   . Chronic kidney disease    decreased kidney function  . Diabetes mellitus without complication ([place])   . Gout   . Hypertension   . Multiple allergies   . Shortness of breath dyspnea    w/ exertion  . Sleep apnea    CPAP  . Swelling of both lower extremities   . Wheezing     Family History: Family History  Problem Relation Age of Onset  . Breast cancer Cousin   . Breast cancer Other     Social History: Social History   Socioeconomic History  . Marital status: Divorced    Spouse name: Not on file  . Number of children: Not on file  . Years of education: Not on file  . Highest education level: Not on file  Occupational History  . Not on file  Social Needs  . Financial resource strain: Not on file  . Food insecurity:    Worry: Not on file    Inability: Not on file  .  Transportation needs:    Medical: Not on file    Non-medical: Not on file  Tobacco Use  . Smoking status: Former Smoker    Types: Cigarettes  . Smokeless tobacco: Never Used  . Tobacco comment: 4 cigarettes quit 20 years ago  Substance and Sexual Activity  . Alcohol use: No  . Drug use: Not on file  . Sexual activity: Not on file  Lifestyle  . Physical activity:    Days per week: Not on file    Minutes per session: Not on file  . Stress: Not on file  Relationships  . Social connections:    Talks on phone: Not on file    Gets together: Not on file  Attends religious service: Not on file    Active member of club or organization: Not on file    Attends meetings of clubs or organizations: Not on file    Relationship status: Not on file  . Intimate partner violence:    Fear of current or ex partner: Not on file    Emotionally abused: Not on file    Physically abused: Not on file    Forced sexual activity: Not on file  Other Topics Concern  . Not on file  Social History Narrative  . Not on file    Vital Signs: Blood pressure 132/64, pulse 69, resp. rate 16, height 5\' 2"  (1.575 m), weight 237 lb (107.5 kg), SpO2 94 %.  Examination: General Appearance: The patient is well-developed, well-nourished, and in no distress. Skin: Gross inspection of skin unremarkable. Head: normocephalic, no gross deformities. Eyes: no gross deformities noted. ENT: ears appear grossly normal no exudates. Neck: Supple. No thyromegaly. No LAD. Respiratory: no rhonchi noted. Cardiovascular: Normal S1 and S2 without murmur or rub. Extremities: No cyanosis. pulses are equal. Neurologic: Alert and oriented. No involuntary movements.  LABS: No results found for this or any previous visit (from the past 2160 hour(s)).  Radiology: Mm 3d Screen Breast Bilateral  Result Date: 08/31/2017 CLINICAL DATA:  Screening. EXAM: DIGITAL SCREENING BILATERAL MAMMOGRAM WITH TOMO AND CAD COMPARISON:  Previous  exam(s). ACR Breast Density Category c: The breast tissue is heterogeneously dense, which may obscure small masses. FINDINGS: There are no findings suspicious for malignancy. Images were processed with CAD. IMPRESSION: No mammographic evidence of malignancy. A result letter of this screening mammogram will be mailed directly to the patient. RECOMMENDATION: Screening mammogram in one year. (Code:SM-B-01Y) BI-RADS CATEGORY  1: Negative. Electronically Signed   By: Lillia Mountain M.D.   On: 08/31/2017 10:42    No results found.  No results found.    Assessment and Plan: Patient Active Problem List   Diagnosis Date Noted  . Uncontrolled type 2 diabetes mellitus with hyperglycemia ([place]) 09/18/2017  . Acute pain of right shoulder 09/18/2017  . Type 2 diabetes mellitus with chronic kidney disease ([place]) 01/19/2017  . Anosmia 01/19/2017  . Cervicalgia 01/19/2017  . Vitamin D deficiency 01/19/2017  . Hypoglycemia 01/19/2017  . Obstructive sleep apnea 01/19/2017  . Essential hypertension, benign 01/19/2017  . Mixed hyperlipidemia 01/19/2017  . Anemia, unspecified 01/19/2017  . Edema 01/19/2017  . SOB (shortness of breath) 01/19/2017  . Gout 01/19/2017  . Morbid obesity ([place]) 01/19/2017  . Chronic kidney disease, stage III (moderate) ([place]) 01/19/2017  . Hypercalcemia 01/19/2017  . Iron deficiency anemia 01/14/2016  . Acute pain of right knee 04/23/2015  . Left wrist pain 08/12/2014  . Status post bilateral unicompartmental knee replacement 11/13/2013  . Arthritis, senescent 08/02/2013    1. Acute bronchitis resolved.  Continue with supportive care monitor for any further flareups.  If she does we will consider getting a chest x-ray. 2. OSA she is using her CPAP no issues she should have another routine download done her compliance has been excellent we will continue to follow along 3. CKD III follow labs stable at this time 4. Morbid obesity work on weight loss she has not had much success in  this aspect.  Again she was encouraged to be active and also to monitor her diet habits  General Counseling: I have discussed the findings of the evaluation and examination with Frances Greene.  I have also discussed any further diagnostic evaluation thatmay be needed  or ordered today. Frances Greene verbalizes understanding of the findings of todays visit. We also reviewed her medications today and discussed drug interactions and side effects including but not limited excessive drowsiness and altered mental states. We also discussed that there is always a risk not just to her but also people around her. she has been encouraged to call the office with any questions or concerns that should arise related to todays visit.    Time spent: 30min  I have personally obtained a history, examined the patient, evaluated laboratory and imaging results, formulated the assessment and plan and placed orders.    Allyne Gee, MD [facility] Pulmonary and Critical Care Sleep medicine

## 2017-11-14 ENCOUNTER — Encounter: Payer: Self-pay | Admitting: Nurse Practitioner

## 2017-11-14 ENCOUNTER — Ambulatory Visit (INDEPENDENT_AMBULATORY_CARE_PROVIDER_SITE_OTHER): Payer: BC Managed Care – PPO | Admitting: Nurse Practitioner

## 2017-11-14 VITALS — BP 140/70 | HR 74 | Resp 16 | Ht 62.0 in | Wt 236.4 lb

## 2017-11-14 DIAGNOSIS — N183 Chronic kidney disease, stage 3 unspecified: Secondary | ICD-10-CM

## 2017-11-14 DIAGNOSIS — J309 Allergic rhinitis, unspecified: Secondary | ICD-10-CM

## 2017-11-14 DIAGNOSIS — I10 Essential (primary) hypertension: Secondary | ICD-10-CM

## 2017-11-14 DIAGNOSIS — E1165 Type 2 diabetes mellitus with hyperglycemia: Secondary | ICD-10-CM

## 2017-11-14 DIAGNOSIS — F331 Major depressive disorder, recurrent, moderate: Secondary | ICD-10-CM

## 2017-11-14 DIAGNOSIS — R69 Illness, unspecified: Secondary | ICD-10-CM | POA: Diagnosis not present

## 2017-11-14 DIAGNOSIS — F5101 Primary insomnia: Secondary | ICD-10-CM | POA: Insufficient documentation

## 2017-11-14 DIAGNOSIS — R5383 Other fatigue: Secondary | ICD-10-CM

## 2017-11-14 LAB — POCT GLYCOSYLATED HEMOGLOBIN (HGB A1C): Hemoglobin A1C: 6.4 % — AB (ref 4.0–5.6)

## 2017-11-14 NOTE — Progress Notes (Signed)
[facility] [place], [place] [address]  Internal MEDICINE  Office Visit Note  Patient Name: Frances Greene  [id number]  [id number]  Date of Service: 11/14/2017  Chief Complaint  Patient presents with  . Medical Management of Chronic Issues    2 month follow up  . Diabetes    The patient is here for routine follow up exam. She is c/o problems sleeping. This is affecting her ability to fall asleep and to stay asleep. She does not take any medication to help her sleep, but does use her CPAP every night. She also states that she is lacking her usual "get up and go." she often feels tired and doesn't really feel like doing much at all. Feels like staying in the home most of the time and states that this just isn't like her. She does take citalopam. Her prescription is written as 20mg  tablet. She states that she has been taking 1/2 tablet of this for some time. Has never increased her dose to 1 tablet per the directions.  She also c/o continues congestion. She has lots of post nasal drip and has to clear her throat often. Was treated earlier this month for acute bronchitis. Completed course of Levaquin and feels much better. Her current symptoms are much improved from how she was feeling at the beginning of the month. She states that taking corecidin and/or allegra does help to relieve the symptoms .      Current Medication: Outpatient Encounter Medications as of 11/14/2017  Medication Sig Note  . allopurinol (ZYLOPRIM) 300 MG tablet TAKE ONE TAB AT NIGHT FOR GOUT   . Amino Acids (DAILY AMINO 6000 PO) Inject 0.6 mg into the skin daily.   Marland Kitchen aspirin EC 325 MG tablet Take 325 mg by mouth. 01/23/2016: Received from: [place]: Take 325 mg by mouth.  Marland Kitchen atorvastatin (LIPITOR) 20 MG tablet Take 1 tablet (20 mg total) by mouth daily.   . bisoprolol-hydrochlorothiazide (ZIAC) 2.5-6.25 MG tablet Take 1 tablet by mouth 2 (two) times daily.   .  Cholecalciferol (D3-1000) 1000 units tablet Take 1,000 Units by mouth daily.   . citalopram (CELEXA) 20 MG tablet Take 20 mg by mouth daily.   . cyclobenzaprine (FLEXERIL) 10 MG tablet Take 5 mg by mouth 2 (two) times daily.   . diphenhydrAMINE (ALLERGY) 25 MG tablet Take 25 mg by mouth daily.   . Ferrous Sulfate 134 MG TABS Take 1 tablet by mouth daily.   . folic acid (FOLVITE) 122 MCG tablet Take 400 mcg by mouth daily.   . furosemide (LASIX) 40 MG tablet TAKE 1 TABLET BY MOUTH ONCE DAILY AND  TAKE  XTRA  TAB  IF  NEEDED   . Garlic 4825 MG CAPS Take by mouth. 01/23/2016: Received from: [place]: Take by mouth.  Marland Kitchen glimepiride (AMARYL) 1 MG tablet Take 1 mg by mouth daily with breakfast.  01/23/2016: Received from: [facility]  . glucose blood test strip 1 each by Other route as needed for other. Use as instructed   . hydrALAZINE (APRESOLINE) 25 MG tablet Take 25 mg by mouth 3 (three) times daily.    Marland Kitchen ketoconazole (NIZORAL) 2 % shampoo  01/23/2016: Received from: [facility]  . lidocaine (LIDODERM) 5 % Place 1 patch onto the skin every 12 (twelve) hours. Remove & Discard patch within 12 hours or as directed by MD   . linaclotide Rolan Lipa)  145 MCG CAPS capsule Take 145 mcg by mouth daily before breakfast.   . meloxicam (MOBIC) 7.5 MG tablet Take 1 tablet (7.5 mg total) by mouth daily. Take one tab po qd for gout and arthritis   . montelukast (SINGULAIR) 10 MG tablet TAKE 1 TABLET BY MOUTH ONCE DAILY   . Omega-3 Fatty Acids (FISH OIL PO) Take by mouth. 01/23/2016: Received from: [place]: Take 1 capsule by mouth 2 (two) times daily.  . polyethylene glycol (MIRALAX / GLYCOLAX) packet Take 17 g by mouth daily as needed.   . Sulfacetamide Sodium (SODIUM SULFACETAMIDE) 10 % SHAM Apply topically.   . traMADol (ULTRAM) 50 MG tablet Take 1 tablet (50 mg total) by mouth 2 (two) times daily.   Marland Kitchen VICTOZA 18  MG/3ML SOPN INJECT 1.8 MG ONCE DAILY SUBCUTANEOUSLY   . Vitamin D, Ergocalciferol, (DRISDOL) 50000 units CAPS capsule  01/23/2016: Received from: [facility]  . [DISCONTINUED] levofloxacin (LEVAQUIN) 500 MG tablet Take 1 tablet (500 mg total) by mouth daily. (Patient not taking: Reported on 11/01/2017)    No facility-administered encounter medications on file as of 11/14/2017.     Surgical History: Past Surgical History:  Procedure Laterality Date  . ABDOMINAL HYSTERECTOMY    . BREAST BIOPSY Left 1989   neg  . CATARACT EXTRACTION W/PHACO Right 05/21/2015   Procedure: CATARACT EXTRACTION PHACO AND INTRAOCULAR LENS PLACEMENT (IOC);  Surgeon: Milus Height, MD;  Location: [facility] ORS;  Service: Ophthalmology;  Laterality: Right;  [place]: AP%: 10.0 CDE: 9.53  . COLONOSCOPY    . JOINT REPLACEMENT Bilateral    knee  . PARS PLANA VITRECTOMY Right 05/21/2015   Procedure: PARS PLANA VITRECTOMY WITH 25 GAUGE;  Surgeon: Milus Height, MD;  Location: [facility] ORS;  Service: Ophthalmology;  Laterality: Right;  Lot # [id number] H Endo laser: [place]: 300 Pulse Duration: 200 Total Pulses:143   . shoulder surgery      Medical History: Past Medical History:  Diagnosis Date  . Arthritis   . Bronchitis   . CHF (congestive heart failure) ([place])   . Chronic kidney disease    decreased kidney function  . Diabetes mellitus without complication ([place])   . Gout   . Hypertension   . Multiple allergies   . Shortness of breath dyspnea    w/ exertion  . Sleep apnea    CPAP  . Swelling of both lower extremities   . Wheezing     Family History: Family History  Problem Relation Age of Onset  . Breast cancer Cousin   . Breast cancer Other     Social History   Socioeconomic History  . Marital status: Divorced    Spouse name: Not on file  . Number of children: Not on file  . Years of education: Not on file  . Highest education level: Not on file  Occupational History  . Not on file   Social Needs  . Financial resource strain: Not on file  . Food insecurity:    Worry: Not on file    Inability: Not on file  . Transportation needs:    Medical: Not on file    Non-medical: Not on file  Tobacco Use  . Smoking status: Former Smoker    Types: Cigarettes  . Smokeless tobacco: Never Used  . Tobacco comment: 4 cigarettes quit 20 years ago  Substance and Sexual Activity  . Alcohol use: No  . Drug use: Not on file  . Sexual activity: Not  on file  Lifestyle  . Physical activity:    Days per week: Not on file    Minutes per session: Not on file  . Stress: Not on file  Relationships  . Social connections:    Talks on phone: Not on file    Gets together: Not on file    Attends religious service: Not on file    Active member of club or organization: Not on file    Attends meetings of clubs or organizations: Not on file    Relationship status: Not on file  . Intimate partner violence:    Fear of current or ex partner: Not on file    Emotionally abused: Not on file    Physically abused: Not on file    Forced sexual activity: Not on file  Other Topics Concern  . Not on file  Social History Narrative  . Not on file      Review of Systems  Constitutional: Positive for activity change and fatigue. Negative for chills and unexpected weight change.  HENT: Positive for postnasal drip. Negative for congestion, rhinorrhea, sneezing and sore throat.        Frequent throat clearing.   Eyes: Negative for redness.  Respiratory: Negative for cough, chest tightness, shortness of breath and wheezing.   Cardiovascular: Negative for chest pain and palpitations.  Gastrointestinal: Negative for abdominal pain, constipation, diarrhea, nausea and vomiting.  Endocrine: Negative for cold intolerance, heat intolerance, polydipsia, polyphagia and polyuria.       Blood sugars doing well   Genitourinary: Negative for dysuria and frequency.  Musculoskeletal: Negative for arthralgias, back  pain, joint swelling and neck pain.  Skin: Negative for rash.  Allergic/Immunologic: Positive for environmental allergies.  Neurological: Negative for dizziness, tremors, numbness and headaches.  Hematological: Negative for adenopathy. Does not bruise/bleed easily.  Psychiatric/Behavioral: Positive for dysphoric mood and sleep disturbance. Negative for behavioral problems (Depression) and suicidal ideas. The patient is not nervous/anxious.     Today's Vitals   11/14/17 1008  BP: 140/70  Pulse: 74  Resp: 16  SpO2: 95%  Weight: 236 lb 6.4 oz (107.2 kg)  Height: 5\' 2"  (1.575 m)    Physical Exam  Constitutional: She is oriented to person, place, and time. She appears well-developed and well-nourished. No distress.  HENT:  Head: Normocephalic and atraumatic.  Nose: Nose normal.  Mouth/Throat: Oropharynx is clear and moist. No oropharyngeal exudate.  Eyes: Pupils are equal, round, and reactive to light. Conjunctivae and EOM are normal.  Neck: Normal range of motion. Neck supple. No JVD present. No tracheal deviation present. No thyromegaly present.  Cardiovascular: Normal rate and regular rhythm. Exam reveals no gallop and no friction rub.  Murmur heard.  Systolic murmur is present with a grade of 2/6. Pulmonary/Chest: Effort normal and breath sounds normal. No respiratory distress. She has no wheezes. She has no rales. She exhibits no tenderness.  Abdominal: Soft. Bowel sounds are normal. There is no tenderness.  Musculoskeletal: Normal range of motion.  Lymphadenopathy:    She has no cervical adenopathy.  Neurological: She is alert and oriented to person, place, and time. No cranial nerve deficit.  Skin: Skin is warm and dry. She is not diaphoretic.  Psychiatric: Her speech is normal and behavior is normal. Judgment and thought content normal. Cognition and memory are normal. She exhibits a depressed mood.  Nursing note and vitals reviewed.  Assessment/Plan: 1. Uncontrolled type 2  diabetes mellitus with hyperglycemia (HCC) - POCT HgB A1C 6.4 today.  Continue diabetic medication as prescribed. Check labs.  - CBC  2. Stage 3 chronic kidney disease ([place]) Has been stable. Will check BMP and monitor regularly.  - Basic Metabolic Panel (BMET)  3. Essential hypertension, benign Continue BP medication as prescribed.  - CBC  4. Other fatigue Check labs - CBC - TSH + free T4  5. Moderate episode of recurrent major depressive disorder ([place]) Recommended that patient increase her citalopram to full 20mg  tablet. Will reassess at next visit.   6. Chronic allergic rhinitis Continue allergy medication as prescribed   7. Primary insomnia Possibly related to increased depression. Increase citalopram as above. Recommend trying otc melatonin 5mg  at bedtime to help promote healthy sleep.   General Counseling: cornelious diven understanding of the findings of todays visit and agrees with plan of treatment. I have discussed any further diagnostic evaluation that may be needed or ordered today. We also reviewed her medications today. she has been encouraged to call the office with any questions or concerns that should arise related to todays visit.    Orders Placed This Encounter  Procedures  . Basic Metabolic Panel (BMET)  . CBC  . TSH + free T4  . POCT HgB A1C    Diabetes Counseling:  1. Addition of ACE inh/ ARB'S for nephroprotection. Microalbumin is updated  2. Diabetic foot care, prevention of complications. Podiatry consult 3. Exercise and lose weight.  4. Diabetic eye examination, Diabetic eye exam is updated  5. Monitor blood sugar closlely. nutrition counseling.  6. Sign and symptoms of hypoglycemia including shaking sweating,confusion and headaches.   This patient was seen by Leretha Pol FNP Collaboration with Dr Lavera Guise as a part of collaborative care agreement  Time spent: 25 Minutes      Dr Lavera Guise Internal medicine

## 2017-12-03 ENCOUNTER — Other Ambulatory Visit: Payer: Self-pay | Admitting: Internal Medicine

## 2017-12-08 DIAGNOSIS — N289 Disorder of kidney and ureter, unspecified: Secondary | ICD-10-CM | POA: Diagnosis not present

## 2017-12-08 DIAGNOSIS — R69 Illness, unspecified: Secondary | ICD-10-CM | POA: Diagnosis not present

## 2017-12-08 DIAGNOSIS — I10 Essential (primary) hypertension: Secondary | ICD-10-CM | POA: Diagnosis not present

## 2017-12-08 DIAGNOSIS — E1165 Type 2 diabetes mellitus with hyperglycemia: Secondary | ICD-10-CM | POA: Diagnosis not present

## 2017-12-08 DIAGNOSIS — R5383 Other fatigue: Secondary | ICD-10-CM | POA: Diagnosis not present

## 2017-12-09 LAB — CBC
HEMATOCRIT: 34.5 % (ref 34.0–46.6)
HEMOGLOBIN: 11.4 g/dL (ref 11.1–15.9)
MCH: 27.4 pg (ref 26.6–33.0)
MCHC: 33 g/dL (ref 31.5–35.7)
MCV: 83 fL (ref 79–97)
Platelets: 152 10*3/uL (ref 150–450)
RBC: 4.16 x10E6/uL (ref 3.77–5.28)
RDW: 13.4 % (ref 12.3–15.4)
WBC: 7 10*3/uL (ref 3.4–10.8)

## 2017-12-09 LAB — TSH+FREE T4
FREE T4: 1.18 ng/dL (ref 0.82–1.77)
TSH: 2.52 u[IU]/mL (ref 0.450–4.500)

## 2017-12-09 LAB — BASIC METABOLIC PANEL
BUN/Creatinine Ratio: 16 (ref 12–28)
BUN: 27 mg/dL (ref 8–27)
CO2: 23 mmol/L (ref 20–29)
Calcium: 11 mg/dL — ABNORMAL HIGH (ref 8.7–10.3)
Chloride: 106 mmol/L (ref 96–106)
Creatinine, Ser: 1.69 mg/dL — ABNORMAL HIGH (ref 0.57–1.00)
GFR, EST AFRICAN AMERICAN: 34 mL/min/{1.73_m2} — AB (ref >59)
GFR, EST NON AFRICAN AMERICAN: 30 mL/min/{1.73_m2} — AB (ref >59)
Glucose: 137 mg/dL — ABNORMAL HIGH (ref 65–99)
Potassium: 4.5 mmol/L (ref 3.5–5.2)
Sodium: 143 mmol/L (ref 134–144)

## 2017-12-28 ENCOUNTER — Emergency Department
Admission: EM | Admit: 2017-12-28 | Discharge: 2017-12-28 | Disposition: A | Discharge status: Home / Self Care | Payer: Medicare HMO | Attending: Emergency Medicine | Admitting: Emergency Medicine

## 2017-12-28 ENCOUNTER — Other Ambulatory Visit: Payer: Self-pay

## 2017-12-28 ENCOUNTER — Emergency Department: Payer: Medicare HMO

## 2017-12-28 DIAGNOSIS — E1122 Type 2 diabetes mellitus with diabetic chronic kidney disease: Secondary | ICD-10-CM | POA: Insufficient documentation

## 2017-12-28 DIAGNOSIS — Y9389 Activity, other specified: Secondary | ICD-10-CM | POA: Diagnosis not present

## 2017-12-28 DIAGNOSIS — I130 Hypertensive heart and chronic kidney disease with heart failure and stage 1 through stage 4 chronic kidney disease, or unspecified chronic kidney disease: Secondary | ICD-10-CM | POA: Insufficient documentation

## 2017-12-28 DIAGNOSIS — R52 Pain, unspecified: Secondary | ICD-10-CM | POA: Diagnosis not present

## 2017-12-28 DIAGNOSIS — Z79899 Other long term (current) drug therapy: Secondary | ICD-10-CM | POA: Diagnosis not present

## 2017-12-28 DIAGNOSIS — Z87891 Personal history of nicotine dependence: Secondary | ICD-10-CM | POA: Diagnosis not present

## 2017-12-28 DIAGNOSIS — Y998 Other external cause status: Secondary | ICD-10-CM | POA: Insufficient documentation

## 2017-12-28 DIAGNOSIS — S80211A Abrasion, right knee, initial encounter: Secondary | ICD-10-CM | POA: Diagnosis not present

## 2017-12-28 DIAGNOSIS — Y92481 Parking lot as the place of occurrence of the external cause: Secondary | ICD-10-CM | POA: Diagnosis not present

## 2017-12-28 DIAGNOSIS — Z7982 Long term (current) use of aspirin: Secondary | ICD-10-CM | POA: Diagnosis not present

## 2017-12-28 DIAGNOSIS — W0110XA Fall on same level from slipping, tripping and stumbling with subsequent striking against unspecified object, initial encounter: Secondary | ICD-10-CM | POA: Insufficient documentation

## 2017-12-28 DIAGNOSIS — M25519 Pain in unspecified shoulder: Secondary | ICD-10-CM | POA: Diagnosis not present

## 2017-12-28 DIAGNOSIS — W19XXXA Unspecified fall, initial encounter: Secondary | ICD-10-CM | POA: Diagnosis not present

## 2017-12-28 DIAGNOSIS — S42212A Unspecified displaced fracture of surgical neck of left humerus, initial encounter for closed fracture: Secondary | ICD-10-CM | POA: Insufficient documentation

## 2017-12-28 DIAGNOSIS — N183 Chronic kidney disease, stage 3 (moderate): Secondary | ICD-10-CM | POA: Insufficient documentation

## 2017-12-28 DIAGNOSIS — I509 Heart failure, unspecified: Secondary | ICD-10-CM | POA: Insufficient documentation

## 2017-12-28 DIAGNOSIS — S4992XA Unspecified injury of left shoulder and upper arm, initial encounter: Secondary | ICD-10-CM | POA: Diagnosis present

## 2017-12-28 MED ORDER — HYDROCODONE-ACETAMINOPHEN 5-325 MG PO TABS
1.0000 | ORAL_TABLET | Freq: Once | ORAL | Status: AC
  Administered 2017-12-28: 1 via ORAL
  Filled 2017-12-28: qty 1

## 2017-12-28 MED ORDER — FENTANYL CITRATE (PF) 100 MCG/2ML IJ SOLN
75.0000 ug | Freq: Once | INTRAMUSCULAR | Status: AC
  Administered 2017-12-28: 75 ug via INTRAVENOUS
  Filled 2017-12-28: qty 2

## 2017-12-28 MED ORDER — HYDROCODONE-ACETAMINOPHEN 5-325 MG PO TABS: 1.0000 | ORAL_TABLET | Freq: Four times a day (QID) | ORAL | 0 refills | Status: DC | PRN

## 2017-12-28 NOTE — ED Notes (Signed)
Shoulder immobilizer placed. Patient tolerated well.

## 2017-12-28 NOTE — ED Provider Notes (Signed)
[facility] Provider Note   ____________________________________________   First MD Initiated Contact with Patient 12/28/17 1856     (approximate)  I have reviewed the triage vital signs and the nursing notes.   HISTORY  Chief Complaint Shoulder Pain and Knee Pain    HPI Frances Greene is a 72 y.o. female here for evaluation after a fall  Patient was getting ready to get in her car, tripped over the parking dividers in the lot, fell onto her left side reports she fell onto her knees but also under her left arm.  She had severe discomfort and pain in the middle of the left upper arm.  No fevers or chills.  No preceding symptoms like headache, chest pain or shortness of breath.  Reports she is been in normal in good health until tripping and falling.  Did not strike her head.  Does not take any blood thinners other than occasional aspirin.  Denies any neck or back pain.  There is no numbness tingling or weakness in any of the arms or legs.  Abrasions over both her knees, but reports they feel okay and she does not think she would have injured or broken either of them.  Pain is primarily severe over the left mid arm.   Past Medical History:  Diagnosis Date  . Arthritis   . Bronchitis   . CHF (congestive heart failure) ([place])   . Chronic kidney disease    decreased kidney function  . Diabetes mellitus without complication ([place])   . Gout   . Hypertension   . Multiple allergies   . Shortness of breath dyspnea    w/ exertion  . Sleep apnea    CPAP  . Swelling of both lower extremities   . Wheezing     Patient Active Problem List   Diagnosis Date Noted  . Other fatigue 11/14/2017  . Moderate episode of recurrent major depressive disorder ([place]) 11/14/2017  . Chronic allergic rhinitis 11/14/2017  . Primary insomnia 11/14/2017  . Uncontrolled type 2 diabetes mellitus with hyperglycemia ([place]) 09/18/2017  . Acute pain of right  shoulder 09/18/2017  . Type 2 diabetes mellitus with chronic kidney disease ([place]) 01/19/2017  . Anosmia 01/19/2017  . Cervicalgia 01/19/2017  . Vitamin D deficiency 01/19/2017  . Hypoglycemia 01/19/2017  . Obstructive sleep apnea 01/19/2017  . Essential hypertension, benign 01/19/2017  . Mixed hyperlipidemia 01/19/2017  . Anemia, unspecified 01/19/2017  . Edema 01/19/2017  . SOB (shortness of breath) 01/19/2017  . Gout 01/19/2017  . Morbid obesity ([place]) 01/19/2017  . Stage 3 chronic kidney disease ([place]) 01/19/2017  . Hypercalcemia 01/19/2017  . Iron deficiency anemia 01/14/2016  . Acute pain of right knee 04/23/2015  . Left wrist pain 08/12/2014  . Status post bilateral unicompartmental knee replacement 11/13/2013  . Arthritis, senescent 08/02/2013    Past Surgical History:  Procedure Laterality Date  . ABDOMINAL HYSTERECTOMY    . BREAST BIOPSY Left 1989   neg  . CATARACT EXTRACTION W/PHACO Right 05/21/2015   Procedure: CATARACT EXTRACTION PHACO AND INTRAOCULAR LENS PLACEMENT (IOC);  Surgeon: Milus Height, MD;  Location: [facility] ORS;  Service: Ophthalmology;  Laterality: Right;  [place]: AP%: 10.0 CDE: 9.53  . COLONOSCOPY    . JOINT REPLACEMENT Bilateral    knee  . PARS PLANA VITRECTOMY Right 05/21/2015   Procedure: PARS PLANA VITRECTOMY WITH 25 GAUGE;  Surgeon: Milus Height, MD;  Location: [facility] ORS;  Service: Ophthalmology;  Laterality: Right;  Lot #  [id number] H Endo laser: [place]: 300 Pulse Duration: 200 Total Pulses:143   . shoulder surgery      Prior to Admission medications   Medication Sig Start Date End Date Taking? Authorizing Provider  allopurinol (ZYLOPRIM) 300 MG tablet TAKE ONE TAB AT NIGHT FOR GOUT 07/27/17   Lavera Guise, MD  Amino Acids (DAILY AMINO 6000 PO) Inject 0.6 mg into the skin daily.    [provider]  aspirin EC 325 MG tablet Take 325 mg by mouth.    [provider]  atorvastatin (LIPITOR) 20 MG tablet Take 1 tablet (20 mg total) by  mouth daily. 03/03/17   Lavera Guise, MD  bisoprolol-hydrochlorothiazide [facility]) 2.5-6.25 MG tablet Take 1 tablet by mouth 2 (two) times daily. 05/26/17   Lavera Guise, MD  Cholecalciferol (D3-1000) 1000 units tablet Take 1,000 Units by mouth daily.    [provider]  citalopram (CELEXA) 20 MG tablet Take 20 mg by mouth daily.    [provider]  cyclobenzaprine (FLEXERIL) 10 MG tablet Take 5 mg by mouth 2 (two) times daily.    [provider]  diphenhydrAMINE (ALLERGY) 25 MG tablet Take 25 mg by mouth daily.    [provider]  Ferrous Sulfate 134 MG TABS Take 1 tablet by mouth daily.    [provider]  folic acid (FOLVITE) 509 MCG tablet Take 400 mcg by mouth daily.    [provider]  furosemide (LASIX) 40 MG tablet TAKE 1 TABLET BY MOUTH ONCE DAILY AND  TAKE  XTRA  TAB  IF  NEEDED 09/29/17   Ronnell Freshwater, NP  Garlic 3267 MG CAPS Take by mouth.    [provider]  glimepiride (AMARYL) 1 MG tablet TAKE 1 TABLET BY MOUTH ONCE DAILY 12/05/17   Leretha Pol E, NP  glucose blood test strip 1 each by Other route as needed for other. Use as instructed    [provider]  hydrALAZINE (APRESOLINE) 25 MG tablet Take 25 mg by mouth 3 (three) times daily.     [provider]  HYDROcodone-acetaminophen (NORCO/VICODIN) 5-325 MG tablet Take 1 tablet by mouth every 6 (six) hours as needed for moderate pain or severe pain. 12/28/17   Delman Kitten, MD  ketoconazole (NIZORAL) 2 % shampoo  12/28/13   [provider]  lidocaine (LIDODERM) 5 % Place 1 patch onto the skin every 12 (twelve) hours. Remove & Discard patch within 12 hours or as directed by MD 08/22/17 08/22/18  Merlyn Lot, MD  linaclotide [facility]) 145 MCG CAPS capsule Take 145 mcg by mouth daily before breakfast.    [provider]  meloxicam (MOBIC) 7.5 MG tablet Take 1 tablet (7.5 mg total) by mouth daily. Take one tab po qd for gout and arthritis  07/27/17   Lavera Guise, MD  montelukast (SINGULAIR) 10 MG tablet TAKE 1 TABLET BY MOUTH ONCE DAILY 08/09/17   Lavera Guise, MD  Omega-3 Fatty Acids (FISH OIL PO) Take by mouth.    [provider]  polyethylene glycol (MIRALAX / GLYCOLAX) packet Take 17 g by mouth daily as needed.    [provider]  Sulfacetamide Sodium (SODIUM SULFACETAMIDE) 10 % SHAM Apply topically.    [provider]  VICTOZA 18 MG/3ML SOPN INJECT 1.8 MG ONCE DAILY SUBCUTANEOUSLY 10/13/17   Ronnell Freshwater, NP  Vitamin D, Ergocalciferol, (DRISDOL) 50000 units CAPS capsule  03/06/15   [provider]    Allergies Altace [ramipril]  Family History  Problem Relation Age of Onset  . Breast cancer Cousin   . Breast cancer Other     Social History Social History   Tobacco Use  . Smoking status: Former Smoker    Types: Cigarettes  . Smokeless tobacco: Never Used  . Tobacco comment: 4 cigarettes quit 20 years ago  Substance Use Topics  . Alcohol use: No  . Drug use: Not on file    Review of Systems Constitutional: No fever/chills Eyes: No visual changes. ENT: No sore throat. Cardiovascular: Denies chest pain. Respiratory: Denies shortness of breath. Gastrointestinal: No abdominal pain.   Genitourinary: Negative for dysuria. Musculoskeletal: Negative for back pain. Skin: Negative for rash. Neurological: Negative for headaches, areas of focal weakness or numbness.    ____________________________________________   PHYSICAL EXAM:  VITAL SIGNS: ED Triage Vitals  Enc Vitals Group     BP 12/28/17 1848 (!) 170/59     Pulse Rate 12/28/17 1848 76     Resp 12/28/17 1848 20     Temp 12/28/17 1848 98.3 F (36.8 C)     Temp Source 12/28/17 1848 Oral     SpO2 12/28/17 1848 98 %     Weight 12/28/17 1850 239 lb (108.4 kg)     Height 12/28/17 1850 5\' 2"  (1.575 m)     Head Circumference --      Peak Flow --      Pain Score 12/28/17 1849 10     Pain Loc --      Pain Edu?  --      Excl. in [place]? --     Constitutional: Alert and oriented.  Appears in pain, holding her left upper arm reporting pain in the mid left upper arm. Eyes: Conjunctivae are normal. Head: Atraumatic. Nose: No congestion/rhinnorhea. Mouth/Throat: Mucous membranes are moist. Neck: No stridor.  Cardiovascular: Normal rate, regular rhythm. Grossly normal heart sounds.  Good peripheral circulation. Respiratory: Normal respiratory effort.  No retractions. Lungs CTAB. Gastrointestinal: Soft and nontender. No distention. Musculoskeletal:   Lower Extremities  No edema. Normal DP/PT pulses bilateral with good cap refill.  Normal neuro-motor function lower extremities bilateral.  RIGHT Right lower extremity demonstrates normal strength, good use of all muscles. No edema bruising or contusions of the right hip, right knee, right ankle. Full range of motion of the right lower extremity without pain although abrasions are noted over the anterior kneecap with slight skin there. No pain on axial loading. No evidence of trauma.  LEFT Left lower extremity demonstrates normal strength, good use of all muscles. No edema bruising or contusions of the hip,  knee, ankle. Full range of motion of the left lower extremity without pain. No pain on axial loading. No evidence of trauma except for slight bruising over the anterior kneecap.  RIGHT Right upper extremity demonstrates normal strength, good use of all muscles. No edema bruising or contusions of the right shoulder/upper arm, right elbow, right forearm / hand. Full range of motion of the right right upper extremity without pain. No evidence of trauma. Strong radial pulse. Intact median/ulnar/radial neuro-muscular exam.  LEFT Left upper extremity demonstrates normal strength, good use of all muscles except limitation in range of motion due to pain in the left upper arm. No edema bruising or contusions of the left shoulder/upper arm except it does seem just  slightly swollen mid humerus out an obvious deformity, left elbow, left forearm / hand. Strong radial pulse. Intact median/ulnar/radial neuro-muscular exam.   Neurologic:  Normal speech  and language. No gross focal neurologic deficits are appreciated.  Skin:  Skin is warm, dry and intact. No rash noted. Psychiatric: Mood and affect are normal. Speech and behavior are normal.  ____________________________________________   LABS (all labs ordered are listed, but only abnormal results are displayed)  Labs Reviewed - No data to display ____________________________________________  EKG   ____________________________________________  RADIOLOGY  Dg Humerus Left  Result Date: 12/28/2017 CLINICAL DATA:  Fall with pain EXAM: LEFT HUMERUS - 2+ VIEW COMPARISON:  None. FINDINGS: Acute fracture involving the left humeral neck with moderate valgus angulation and about 1/3 bone with displacement of the distal fracture fragment toward the midline. Suspicion of irregular lucencies within the humeral head. Humeral head does not appear dislocated. IMPRESSION: Acute displaced and angulated left humeral neck fracture with suspected fracture lucencies in the humeral head. Electronically Signed   By: Donavan Foil M.D.   On: 12/28/2017 19:35     X-ray results reviewed by me, displaced and angulated left humeral neck fracture.  Discussed case with Dr. Harlow Mares. ____________________________________________   PROCEDURES  Procedure(s) performed: None  Procedures  Critical Care performed: No  ____________________________________________   INITIAL IMPRESSION / ASSESSMENT AND PLAN / ED COURSE  Pertinent labs & imaging results that were available during my care of the patient were reviewed by me and considered in my medical decision making (see chart for details).   Patient presents after a fall, appears mechanical in nature.  Nexus cervical spine and [place] head CT's rules are negative except age...  But patient denies any loss of consciousness or head strike or head injury.  I do not see any evidence to support a head injury, and do not see any symptoms to suggest head injury at this time.  CT scanning does not appear to be clinically indicated at this time.  Will provide pain medication as she appears to be in severe pain and I am suspicious could have a humeral fracture or potentially dislocation, though site of injury seems to favor possible fracture.  Able to range the knee as well, with slight abrasions and minimal skin tear over the right, no evidence of deformity or anything to suggest at this time of any underlying fracture would be present in the knees.   ----------------------------------------- 10:58 PM on 12/28/2017 -----------------------------------------  Patient pain improved.  Placed in the shoulder immobilizer with improvement, no pain and feeling improved.  I will prescribe the patient a narcotic pain medicine due to their condition which I anticipate will cause at least moderate pain short term. I discussed with the patient safe use of narcotic pain medicines, and that they are not to drive, work in dangerous areas, or ever take more than prescribed (no more than 1 pill every 6 hours). We discussed that this is the type of medication that can be  overdosed on and the risks of this type of medicine. Patient is very agreeable to only use as prescribed and to never use more than prescribed.  PMP database reviewed, no recent prescriptions except for tramadol in July.  Patient reports is not taking any now.  Patient appears improved, able to ambulate.  Slight abrasions of the right anterior knee, no deformities and able to ambulate without pain or discomfort.  Will discharge, following up closely with orthopedics.  Plan of care and follow-up discussed with patient and family who are in agreement.     ____________________________________________   FINAL CLINICAL IMPRESSION(S)  / ED DIAGNOSES  Final diagnoses:  Closed displaced  fracture of surgical neck of left humerus, unspecified fracture morphology, initial encounter  Abrasion, right knee, initial encounter        Note:  This document was prepared using Dragon voice recognition software and may include unintentional dictation errors       Delman Kitten, MD 12/28/17 2259

## 2017-12-28 NOTE — ED Notes (Signed)
Pt walked with tech assistance to restroom.

## 2017-12-28 NOTE — ED Notes (Signed)
Wound on left knee cleaned and wrapped with nonstick dressing and covered with guaze.

## 2017-12-28 NOTE — ED Triage Notes (Addendum)
S/p mechanical fall. Patient report tripped on sidewalk. Presents with pain to left shoulder, right knee pain. abrassions noted to right knee no active bleeding. Denies LOC

## 2017-12-29 DIAGNOSIS — S42202A Unspecified fracture of upper end of left humerus, initial encounter for closed fracture: Secondary | ICD-10-CM | POA: Diagnosis not present

## 2018-01-13 DIAGNOSIS — S42202A Unspecified fracture of upper end of left humerus, initial encounter for closed fracture: Secondary | ICD-10-CM | POA: Diagnosis not present

## 2018-01-17 ENCOUNTER — Other Ambulatory Visit: Payer: Self-pay | Admitting: Internal Medicine

## 2018-01-19 ENCOUNTER — Other Ambulatory Visit: Payer: Self-pay

## 2018-01-19 MED ORDER — MONTELUKAST SODIUM 10 MG PO TABS: 10.0000 mg | ORAL_TABLET | Freq: Every day | ORAL | 6 refills | Status: DC

## 2018-01-31 ENCOUNTER — Encounter: Payer: Self-pay | Admitting: Internal Medicine

## 2018-01-31 ENCOUNTER — Ambulatory Visit (INDEPENDENT_AMBULATORY_CARE_PROVIDER_SITE_OTHER): Payer: Medicare Other | Admitting: Internal Medicine

## 2018-01-31 DIAGNOSIS — I10 Essential (primary) hypertension: Secondary | ICD-10-CM | POA: Diagnosis not present

## 2018-01-31 DIAGNOSIS — N183 Chronic kidney disease, stage 3 unspecified: Secondary | ICD-10-CM

## 2018-01-31 DIAGNOSIS — S42291S Other displaced fracture of upper end of right humerus, sequela: Secondary | ICD-10-CM

## 2018-01-31 DIAGNOSIS — K5904 Chronic idiopathic constipation: Secondary | ICD-10-CM

## 2018-01-31 DIAGNOSIS — E1165 Type 2 diabetes mellitus with hyperglycemia: Secondary | ICD-10-CM | POA: Diagnosis not present

## 2018-01-31 LAB — POCT GLYCOSYLATED HEMOGLOBIN (HGB A1C): HEMOGLOBIN A1C: 6 % — AB (ref 4.0–5.6)

## 2018-01-31 MED ORDER — LINACLOTIDE 145 MCG PO CAPS: 145.0000 ug | ORAL_CAPSULE | Freq: Every day | ORAL | 3 refills | Status: DC

## 2018-01-31 NOTE — Progress Notes (Signed)
[facility] [place], [place] [address]  Internal MEDICINE  Office Visit Note  Patient Name: Frances Greene  [id number]  [id number]  Date of Service: 02/21/2018  Chief Complaint  Patient presents with  . Diabetes  . Hypertension  . Fall    broke left arm December 11 took a fall     Diabetes  She presents for her follow-up diabetic visit. She has type 2 diabetes mellitus. Her disease course has been improving. Pertinent negatives for hypoglycemia include no dizziness or headaches. Pertinent negatives for diabetes include no chest pain and no fatigue. There are no hypoglycemic complications.  Hypertension  This is a recurrent problem. The problem has been waxing and waning since onset. The problem is controlled. Pertinent negatives include no chest pain, headaches, neck pain, palpitations or shortness of breath. Risk factors for coronary artery disease include dyslipidemia and diabetes mellitus. There are no compliance problems.   Fall  The accident occurred more than 1 week ago. The fall occurred while walking (pt had a closed fx of [place] of left arm). There was no blood loss. Point of impact: Acute displaced and angulated left humeral neck fracture with. The pain is at a severity of 10/10. Pertinent negatives include no abdominal pain, headaches, nausea or vomiting.  Medication Refill  This is a chronic (constipation) problem. Pertinent negatives include no abdominal pain, arthralgias, chest pain, chills, coughing, diaphoresis, fatigue, headaches, nausea, neck pain or vomiting.    Current Medication: Outpatient Encounter Medications as of 01/31/2018  Medication Sig Note  . allopurinol (ZYLOPRIM) 300 MG tablet TAKE ONE TAB AT NIGHT FOR GOUT   . aspirin EC 325 MG tablet Take 325 mg by mouth. 01/23/2016: Received from: [place]: Take 325 mg by mouth.  Marland Kitchen atorvastatin (LIPITOR) 20 MG tablet Take 1 tablet (20 mg total) by mouth daily.   .  bisoprolol-hydrochlorothiazide (ZIAC) 2.5-6.25 MG tablet Take 1 tablet by mouth 2 (two) times daily.   . Cholecalciferol (D3-1000) 1000 units tablet Take 1,000 Units by mouth daily.   . citalopram (CELEXA) 20 MG tablet Take 20 mg by mouth daily.   . diphenhydrAMINE (ALLERGY) 25 MG tablet Take 25 mg by mouth daily.   . Ferrous Sulfate 134 MG TABS Take 1 tablet by mouth daily.   . folic acid (FOLVITE) 973 MCG tablet Take 400 mcg by mouth daily.   . furosemide (LASIX) 40 MG tablet TAKE 1 TABLET BY MOUTH ONCE DAILY AND  TAKE  XTRA  TAB  IF  NEEDED   . Garlic 5329 MG CAPS Take by mouth. 01/23/2016: Received from: [place]: Take by mouth.  Marland Kitchen glimepiride (AMARYL) 1 MG tablet TAKE 1 TABLET BY MOUTH ONCE DAILY   . glucose blood test strip 1 each by Other route as needed for other. Use as instructed   . hydrALAZINE (APRESOLINE) 25 MG tablet TAKE 1 TABLET BY MOUTH THREE TIMES DAILY FOR BLOOD PRESSURE   . HYDROcodone-acetaminophen (NORCO/VICODIN) 5-325 MG tablet Take 1 tablet by mouth every 6 (six) hours as needed for moderate pain or severe pain.   Marland Kitchen ketoconazole (NIZORAL) 2 % shampoo  01/23/2016: Received from: [facility]  . linaclotide (LINZESS) 145 MCG CAPS capsule Take 1 capsule (145 mcg total) by mouth daily before breakfast.   . meloxicam (MOBIC) 7.5 MG tablet Take 1 tablet (7.5 mg total) by mouth daily. Take one tab po qd for gout and arthritis   .  montelukast (SINGULAIR) 10 MG tablet Take 1 tablet (10 mg total) by mouth daily.   . Omega-3 Fatty Acids (FISH OIL PO) Take by mouth. 01/23/2016: Received from: [place]: Take 1 capsule by mouth 2 (two) times daily.  . polyethylene glycol (MIRALAX / GLYCOLAX) packet Take 17 g by mouth daily as needed.   . Sulfacetamide Sodium (SODIUM SULFACETAMIDE) 10 % SHAM Apply topically.   Marland Kitchen VICTOZA 18 MG/3ML SOPN INJECT 1.8 MG ONCE DAILY SUBCUTANEOUSLY   . Vitamin D, Ergocalciferol,  (DRISDOL) 50000 units CAPS capsule  01/23/2016: Received from: [facility]  . [DISCONTINUED] linaclotide (LINZESS) 145 MCG CAPS capsule Take 145 mcg by mouth daily before breakfast.   . [DISCONTINUED] Amino Acids (DAILY AMINO 6000 PO) Inject 0.6 mg into the skin daily.   . [DISCONTINUED] cyclobenzaprine (FLEXERIL) 10 MG tablet Take 5 mg by mouth 2 (two) times daily.   . [DISCONTINUED] lidocaine (LIDODERM) 5 % Place 1 patch onto the skin every 12 (twelve) hours. Remove & Discard patch within 12 hours or as directed by MD (Patient not taking: Reported on 01/31/2018)    No facility-administered encounter medications on file as of 01/31/2018.     Surgical History: Past Surgical History:  Procedure Laterality Date  . ABDOMINAL HYSTERECTOMY    . BREAST BIOPSY Left 1989   neg  . CATARACT EXTRACTION W/PHACO Right 05/21/2015   Procedure: CATARACT EXTRACTION PHACO AND INTRAOCULAR LENS PLACEMENT (IOC);  Surgeon: Milus Height, MD;  Location: [facility] ORS;  Service: Ophthalmology;  Laterality: Right;  [place]: AP%: 10.0 CDE: 9.53  . COLONOSCOPY    . JOINT REPLACEMENT Bilateral    knee  . PARS PLANA VITRECTOMY Right 05/21/2015   Procedure: PARS PLANA VITRECTOMY WITH 25 GAUGE;  Surgeon: Milus Height, MD;  Location: [facility] ORS;  Service: Ophthalmology;  Laterality: Right;  Lot # [id number] H Endo laser: Watts: 300 Pulse Duration: 200 Total Pulses:143   . shoulder surgery      Medical History: Past Medical History:  Diagnosis Date  . Arthritis   . Bronchitis   . CHF (congestive heart failure) ([place])   . Chronic kidney disease    decreased kidney function  . Diabetes mellitus without complication ([place])   . Gout   . Hypertension   . Multiple allergies   . Shortness of breath dyspnea    w/ exertion  . Sleep apnea    CPAP  . Swelling of both lower extremities   . Wheezing     Family History: Family History  Problem Relation Age of Onset  . Breast cancer Cousin   . Breast cancer  Other     Social History   Socioeconomic History  . Marital status: Divorced    Spouse name: Not on file  . Number of children: Not on file  . Years of education: Not on file  . Highest education level: Not on file  Occupational History  . Not on file  Social Needs  . Financial resource strain: Not on file  . Food insecurity:    Worry: Not on file    Inability: Not on file  . Transportation needs:    Medical: Not on file    Non-medical: Not on file  Tobacco Use  . Smoking status: Former Smoker    Types: Cigarettes  . Smokeless tobacco: Never Used  . Tobacco comment: 4 cigarettes quit 20 years ago  Substance and Sexual Activity  . Alcohol use: No  . Drug use: Not on  file  . Sexual activity: Not on file  Lifestyle  . Physical activity:    Days per week: Not on file    Minutes per session: Not on file  . Stress: Not on file  Relationships  . Social connections:    Talks on phone: Not on file    Gets together: Not on file    Attends religious service: Not on file    Active member of club or organization: Not on file    Attends meetings of clubs or organizations: Not on file    Relationship status: Not on file  . Intimate partner violence:    Fear of current or ex partner: Not on file    Emotionally abused: Not on file    Physically abused: Not on file    Forced sexual activity: Not on file  Other Topics Concern  . Not on file  Social History Narrative  . Not on file   Review of Systems  Constitutional: Negative for chills, diaphoresis and fatigue.  HENT: Negative for ear pain, postnasal drip and sinus pressure.   Eyes: Negative for photophobia, discharge, redness, itching and visual disturbance.  Respiratory: Negative for cough, shortness of breath and wheezing.   Cardiovascular: Negative for chest pain, palpitations and leg swelling.  Gastrointestinal: Negative for abdominal pain, constipation, diarrhea, nausea and vomiting.  Genitourinary: Negative for  dysuria and flank pain.  Musculoskeletal: Negative for arthralgias, back pain, gait problem and neck pain.  Skin: Negative for color change.  Allergic/Immunologic: Negative for environmental allergies and food allergies.  Neurological: Negative for dizziness and headaches.  Hematological: Does not bruise/bleed easily.  Psychiatric/Behavioral: Negative for agitation, behavioral problems (depression) and hallucinations.    Vital Signs: BP (!) 138/58 (BP Location: Right Arm, Patient Position: Sitting, Cuff Size: Normal)   Pulse 73   Resp 16   Ht 5\' 3"  (1.6 m)   Wt 230 lb (104.3 kg)   SpO2 94%   BMI 40.74 kg/m    Physical Exam Constitutional:      General: She is not in acute distress.    Appearance: She is well-developed. She is not diaphoretic.  HENT:     Head: Normocephalic and atraumatic.     Mouth/Throat:     Pharynx: No oropharyngeal exudate.  Eyes:     Pupils: Pupils are equal, round, and reactive to light.  Neck:     Musculoskeletal: Normal range of motion and neck supple.     Thyroid: No thyromegaly.     Vascular: No JVD.     Trachea: No tracheal deviation.  Cardiovascular:     Rate and Rhythm: Normal rate and regular rhythm.     Heart sounds: Normal heart sounds. No murmur. No friction rub. No gallop.   Pulmonary:     Effort: Pulmonary effort is normal. No respiratory distress.     Breath sounds: No wheezing or rales.  Chest:     Chest wall: No tenderness.  Abdominal:     General: Bowel sounds are normal.     Palpations: Abdomen is soft.  Musculoskeletal: Normal range of motion.  Lymphadenopathy:     Cervical: No cervical adenopathy.  Skin:    General: Skin is warm and dry.  Neurological:     Mental Status: She is alert and oriented to person, place, and time.     Cranial Nerves: No cranial nerve deficit.  Psychiatric:        Behavior: Behavior normal.        Thought  Content: Thought content normal.        Judgment: Judgment normal.     Assessment/Plan: 1. Uncontrolled type 2 diabetes mellitus with hyperglycemia (HCC) - POCT HgB A1C (6.0). controlled  2. Essential hypertension, benign - Stable  3. Stage 3 chronic kidney disease (HCC) - Stable  4. Other closed displaced fracture of proximal end of right humerus, sequela - Per Ortho  5. Chronic idiopathic constipation - Continue Linzess   General Counseling: Florinda verbalizes understanding of the findings of todays visit and agrees with plan of treatment. I have discussed any further diagnostic evaluation that may be needed or ordered today. We also reviewed her medications today. she has been encouraged to call the office with any questions or concerns that should arise related to todays visit.  Orders Placed This Encounter  Procedures  . POCT HgB A1C    Meds ordered this encounter  Medications  . linaclotide (LINZESS) 145 MCG CAPS capsule    Sig: Take 1 capsule (145 mcg total) by mouth daily before breakfast.    Dispense:  90 capsule    Refill:  3    Time spent: 65 Minutes  Dr Lavera Guise Internal medicine

## 2018-02-20 ENCOUNTER — Encounter: Payer: Self-pay | Admitting: Adult Health

## 2018-02-20 ENCOUNTER — Ambulatory Visit (INDEPENDENT_AMBULATORY_CARE_PROVIDER_SITE_OTHER): Payer: BC Managed Care – PPO | Admitting: Adult Health

## 2018-02-20 VITALS — BP 136/57 | HR 84 | Temp 97.1°F | Resp 16 | Ht 63.0 in | Wt 228.0 lb

## 2018-02-20 DIAGNOSIS — G4733 Obstructive sleep apnea (adult) (pediatric): Secondary | ICD-10-CM | POA: Diagnosis not present

## 2018-02-20 DIAGNOSIS — I10 Essential (primary) hypertension: Secondary | ICD-10-CM

## 2018-02-20 NOTE — Progress Notes (Addendum)
[facility] [place],  [address]  Internal MEDICINE  Office Visit Note  Patient Name: Frances Greene  [id number]  [id number]  Date of Service: 02/20/2018  Chief Complaint  Patient presents with  . Hypertension    elevated     HPI  Pt went to physical therapy Friday, and they found her BP to be elevated.  They called and made an appointment for her today. She reports she has been taking an OTC allergy medication, and pain medication for a broken arm.  She stopped those on Friday, and today her BP is 136/57.  She denies ever having headache, Chest pain, or SOB.  Also reports her CPAP machine is not working, it is over 26 years old, and she has not been able to wear it for a month.      Current Medication: Outpatient Encounter Medications as of 02/20/2018  Medication Sig Note  . allopurinol (ZYLOPRIM) 300 MG tablet TAKE ONE TAB AT NIGHT FOR GOUT   . aspirin EC 325 MG tablet Take 325 mg by mouth. 01/23/2016: Received from: [place]: Take 325 mg by mouth.  Marland Kitchen atorvastatin (LIPITOR) 20 MG tablet Take 1 tablet (20 mg total) by mouth daily.   . bisoprolol-hydrochlorothiazide (ZIAC) 2.5-6.25 MG tablet Take 1 tablet by mouth 2 (two) times daily.   . Cholecalciferol (D3-1000) 1000 units tablet Take 1,000 Units by mouth daily.   . citalopram (CELEXA) 20 MG tablet Take 20 mg by mouth daily.   . diphenhydrAMINE (ALLERGY) 25 MG tablet Take 25 mg by mouth daily.   . Ferrous Sulfate 134 MG TABS Take 1 tablet by mouth daily.   . folic acid (FOLVITE) 585 MCG tablet Take 400 mcg by mouth daily.   . furosemide (LASIX) 40 MG tablet TAKE 1 TABLET BY MOUTH ONCE DAILY AND  TAKE  XTRA  TAB  IF  NEEDED   . Garlic 2778 MG CAPS Take by mouth. 01/23/2016: Received from: [place]: Take by mouth.  Marland Kitchen glimepiride (AMARYL) 1 MG tablet TAKE 1 TABLET BY MOUTH ONCE DAILY   . glucose blood test strip 1 each by Other route as needed for  other. Use as instructed   . hydrALAZINE (APRESOLINE) 25 MG tablet TAKE 1 TABLET BY MOUTH THREE TIMES DAILY FOR BLOOD PRESSURE   . HYDROcodone-acetaminophen (NORCO/VICODIN) 5-325 MG tablet Take 1 tablet by mouth every 6 (six) hours as needed for moderate pain or severe pain.   Marland Kitchen ketoconazole (NIZORAL) 2 % shampoo  01/23/2016: Received from: [facility]  . linaclotide (LINZESS) 145 MCG CAPS capsule Take 1 capsule (145 mcg total) by mouth daily before breakfast.   . meloxicam (MOBIC) 7.5 MG tablet Take 1 tablet (7.5 mg total) by mouth daily. Take one tab po qd for gout and arthritis   . montelukast (SINGULAIR) 10 MG tablet Take 1 tablet (10 mg total) by mouth daily.   . Omega-3 Fatty Acids (FISH OIL PO) Take by mouth. 01/23/2016: Received from: [place]: Take 1 capsule by mouth 2 (two) times daily.  . polyethylene glycol (MIRALAX / GLYCOLAX) packet Take 17 g by mouth daily as needed.   . Sulfacetamide Sodium (SODIUM SULFACETAMIDE) 10 % SHAM Apply topically.   Marland Kitchen VICTOZA 18 MG/3ML SOPN INJECT 1.8 MG ONCE DAILY SUBCUTANEOUSLY   . Vitamin D, Ergocalciferol, (DRISDOL) 50000 units CAPS capsule  01/23/2016: Received from: [facility]   No  facility-administered encounter medications on file as of 02/20/2018.     Surgical History: Past Surgical History:  Procedure Laterality Date  . ABDOMINAL HYSTERECTOMY    . BREAST BIOPSY Left 1989   neg  . CATARACT EXTRACTION W/PHACO Right 05/21/2015   Procedure: CATARACT EXTRACTION PHACO AND INTRAOCULAR LENS PLACEMENT (IOC);  Surgeon: Milus Height, MD;  Location: [facility] ORS;  Service: Ophthalmology;  Laterality: Right;  [place]: AP%: 10.0 CDE: 9.53  . COLONOSCOPY    . JOINT REPLACEMENT Bilateral    knee  . PARS PLANA VITRECTOMY Right 05/21/2015   Procedure: PARS PLANA VITRECTOMY WITH 25 GAUGE;  Surgeon: Milus Height, MD;  Location: [facility] ORS;  Service: Ophthalmology;  Laterality: Right;  Lot #  [id number] H Endo laser: [place]: 300 Pulse Duration: 200 Total Pulses:143   . shoulder surgery      Medical History: Past Medical History:  Diagnosis Date  . Arthritis   . Bronchitis   . CHF (congestive heart failure) ([place])   . Chronic kidney disease    decreased kidney function  . Diabetes mellitus without complication ([place])   . Gout   . Hypertension   . Multiple allergies   . Shortness of breath dyspnea    w/ exertion  . Sleep apnea    CPAP  . Swelling of both lower extremities   . Wheezing     Family History: Family History  Problem Relation Age of Onset  . Breast cancer Cousin   . Breast cancer Other     Social History   Socioeconomic History  . Marital status: Divorced    Spouse name: Not on file  . Number of children: Not on file  . Years of education: Not on file  . Highest education level: Not on file  Occupational History  . Not on file  Social Needs  . Financial resource strain: Not on file  . Food insecurity:    Worry: Not on file    Inability: Not on file  . Transportation needs:    Medical: Not on file    Non-medical: Not on file  Tobacco Use  . Smoking status: Former Smoker    Types: Cigarettes  . Smokeless tobacco: Never Used  . Tobacco comment: 4 cigarettes quit 20 years ago  Substance and Sexual Activity  . Alcohol use: No  . Drug use: Not on file  . Sexual activity: Not on file  Lifestyle  . Physical activity:    Days per week: Not on file    Minutes per session: Not on file  . Stress: Not on file  Relationships  . Social connections:    Talks on phone: Not on file    Gets together: Not on file    Attends religious service: Not on file    Active member of club or organization: Not on file    Attends meetings of clubs or organizations: Not on file    Relationship status: Not on file  . Intimate partner violence:    Fear of current or ex partner: Not on file    Emotionally abused: Not on file    Physically abused: Not on file     Forced sexual activity: Not on file  Other Topics Concern  . Not on file  Social History Narrative  . Not on file      Review of Systems  Constitutional: Negative for chills, fatigue and unexpected weight change.  HENT: Negative for congestion, rhinorrhea, sneezing and sore throat.   Eyes: Negative for photophobia,  pain and redness.  Respiratory: Negative for cough, chest tightness and shortness of breath.   Cardiovascular: Negative for chest pain and palpitations.  Gastrointestinal: Negative for abdominal pain, constipation, diarrhea, nausea and vomiting.  Endocrine: Negative.   Genitourinary: Negative for dysuria and frequency.  Musculoskeletal: Negative for arthralgias, back pain, joint swelling and neck pain.  Skin: Negative for rash.  Allergic/Immunologic: Negative.   Neurological: Negative for tremors and numbness.  Hematological: Negative for adenopathy. Does not bruise/bleed easily.  Psychiatric/Behavioral: Negative for behavioral problems and sleep disturbance. The patient is not nervous/anxious.     Vital Signs: BP (!) 136/57   Pulse 84   Temp (!) 97.1 F (36.2 C)   Resp 16   Ht 5\' 3"  (1.6 m)   Wt 228 lb (103.4 kg)   SpO2 96%   BMI 40.39 kg/m    Physical Exam Vitals signs and nursing note reviewed.  Constitutional:      General: She is not in acute distress.    Appearance: She is well-developed. She is not diaphoretic.  HENT:     Head: Normocephalic and atraumatic.     Mouth/Throat:     Pharynx: No oropharyngeal exudate.  Eyes:     Pupils: Pupils are equal, round, and reactive to light.  Neck:     Musculoskeletal: Normal range of motion and neck supple.     Thyroid: No thyromegaly.     Vascular: No JVD.     Trachea: No tracheal deviation.  Cardiovascular:     Rate and Rhythm: Normal rate and regular rhythm.     Heart sounds: Normal heart sounds. No murmur. No friction rub. No gallop.   Pulmonary:     Effort: Pulmonary effort is normal. No  respiratory distress.     Breath sounds: Normal breath sounds. No wheezing or rales.  Chest:     Chest wall: No tenderness.  Abdominal:     Palpations: Abdomen is soft.     Tenderness: There is no abdominal tenderness. There is no guarding.  Musculoskeletal: Normal range of motion.  Lymphadenopathy:     Cervical: No cervical adenopathy.  Skin:    General: Skin is warm and dry.  Neurological:     Mental Status: She is alert and oriented to person, place, and time.     Cranial Nerves: No cranial nerve deficit.  Psychiatric:        Behavior: Behavior normal.        Thought Content: Thought content normal.        Judgment: Judgment normal.    Assessment/Plan: 1. Essential hypertension, benign Patient blood pressure stable and she has not OTC medication.  Had long discussion with patient about possibility of OTC decongestants and other medications making her blood pressure elevate.  Patient verbalized understanding.  Also educated patient on Coricidin products.  2. OSA (obstructive sleep apnea) PSG sleep study ordered for patient due to not being able to use her CPAP anymore. - PSG SLEEP STUDY; Future  3. Morbid obesity (HCC) Obesity Counseling: Risk Assessment: An assessment of behavioral risk factors was made today and includes lack of exercise sedentary lifestyle, lack of portion control and poor dietary habits.  Risk Modification Advice: She was counseled on portion control guidelines. Restricting daily caloric intake to. . The detrimental long term effects of obesity on her health and ongoing poor compliance was also discussed with the patient.    General Counseling: twylah bennetts understanding of the findings of todays visit and agrees with plan of  treatment. I have discussed any further diagnostic evaluation that may be needed or ordered today. We also reviewed her medications today. she has been encouraged to call the office with any questions or concerns that should  arise related to todays visit.    No orders of the defined types were placed in this encounter.   No orders of the defined types were placed in this encounter.   Time spent: 25 Minutes   This patient was seen by Orson Gear AGNP-C in Collaboration with Dr Lavera Guise as a part of collaborative care agreement     Kendell Bane AGNP-C Internal medicine

## 2018-02-20 NOTE — Patient Instructions (Signed)
Hypertension Hypertension is another name for high blood pressure. High blood pressure forces your heart to work harder to pump blood. This can cause problems over time. There are two numbers in a blood pressure reading. There is a top number (systolic) over a bottom number (diastolic). It is best to have a blood pressure below 120/80. Healthy choices can help lower your blood pressure. You may need medicine to help lower your blood pressure if:  Your blood pressure cannot be lowered with healthy choices.  Your blood pressure is higher than 130/80. Follow these instructions at home: Eating and drinking   If directed, follow the DASH eating plan. This diet includes: ? Filling half of your plate at each meal with fruits and vegetables. ? Filling one quarter of your plate at each meal with whole grains. Whole grains include whole wheat pasta, brown rice, and whole grain bread. ? Eating or drinking low-fat dairy products, such as skim milk or low-fat yogurt. ? Filling one quarter of your plate at each meal with low-fat (lean) proteins. Low-fat proteins include fish, skinless chicken, eggs, beans, and tofu. ? Avoiding fatty meat, cured and processed meat, or chicken with skin. ? Avoiding premade or processed food.  Eat less than 1,500 mg of salt (sodium) a day.  Limit alcohol use to no more than 1 drink a day for nonpregnant women and 2 drinks a day for men. One drink equals 12 oz of beer, 5 oz of wine, or 1 oz of hard liquor. Lifestyle  Work with your doctor to stay at a healthy weight or to lose weight. Ask your doctor what the best weight is for you.  Get at least 30 minutes of exercise that causes your heart to beat faster (aerobic exercise) most days of the week. This may include walking, swimming, or biking.  Get at least 30 minutes of exercise that strengthens your muscles (resistance exercise) at least 3 days a week. This may include lifting weights or pilates.  Do not use any  products that contain nicotine or tobacco. This includes cigarettes and e-cigarettes. If you need help quitting, ask your doctor.  Check your blood pressure at home as told by your doctor.  Keep all follow-up visits as told by your doctor. This is important. Medicines  Take over-the-counter and prescription medicines only as told by your doctor. Follow directions carefully.  Do not skip doses of blood pressure medicine. The medicine does not work as well if you skip doses. Skipping doses also puts you at risk for problems.  Ask your doctor about side effects or reactions to medicines that you should watch for. Contact a doctor if:  You think you are having a reaction to the medicine you are taking.  You have headaches that keep coming back (recurring).  You feel dizzy.  You have swelling in your ankles.  You have trouble with your vision. Get help right away if:  You get a very bad headache.  You start to feel confused.  You feel weak or numb.  You feel faint.  You get very bad pain in your: ? Chest. ? Belly (abdomen).  You throw up (vomit) more than once.  You have trouble breathing. Summary  Hypertension is another name for high blood pressure.  Making healthy choices can help lower blood pressure. If your blood pressure cannot be controlled with healthy choices, you may need to take medicine. This information is not intended to replace advice given to you by your health care   provider. Make sure you discuss any questions you have with your health care provider. Document Released: 06/23/2007 Document Revised: 12/03/2015 Document Reviewed: 12/03/2015 Elsevier Interactive Patient Education  2019 [employer] Sleep Apnea Sleep apnea affects breathing during sleep. It causes breathing to stop for a short time or to become shallow. It can also increase the risk of:  Heart attack.  Stroke.  Being very overweight (obese).  Diabetes.  Heart failure.  Irregular  heartbeat. The goal of treatment is to help you breathe normally again. What are the causes? There are three kinds of sleep apnea:  Obstructive sleep apnea. This is caused by a blocked or collapsed airway.  Central sleep apnea. This happens when the brain does not send the right signals to the muscles that control breathing.  Mixed sleep apnea. This is a combination of obstructive and central sleep apnea. The most common cause of this condition is a collapsed or blocked airway. This can happen if:  Your throat muscles are too relaxed.  Your tongue and tonsils are too large.  You are overweight.  Your airway is too small. What increases the risk?  Being overweight.  Smoking.  Having a small airway.  Being older.  Being female.  Drinking alcohol.  Taking medicines to calm yourself (sedatives or tranquilizers).  Having family members with the condition. What are the signs or symptoms?  Trouble staying asleep.  Being sleepy or tired during the day.  Getting angry a lot.  Loud snoring.  Headaches in the morning.  Not being able to focus your mind (concentrate).  Forgetting things.  Less interest in sex.  Mood swings.  Personality changes.  Feelings of sadness (depression).  Waking up a lot during the night to pee (urinate).  Dry mouth.  Sore throat. How is this diagnosed?  Your medical history.  A physical exam.  A test that is done when you are sleeping (sleep study). The test is most often done in a sleep lab but may also be done at home. How is this treated?   Sleeping on your side.  Using a medicine to get rid of mucus in your nose (decongestant).  Avoiding the use of alcohol, medicines to help you relax, or certain pain medicines (narcotics).  Losing weight, if needed.  Changing your diet.  Not smoking.  Using a machine to open your airway while you sleep, such as: ? An oral appliance. This is a mouthpiece that shifts your lower  jaw forward. ? A CPAP device. This device blows air through a mask when you breathe out (exhale). ? An EPAP device. This has valves that you put in each nostril. ? A BPAP device. This device blows air through a mask when you breathe in (inhale) and breathe out.  Having surgery if other treatments do not work. It is important to get treatment for sleep apnea. Without treatment, it can lead to:  High blood pressure.  Coronary artery disease.  In men, not being able to have an erection (impotence).  Reduced thinking ability. Follow these instructions at home: Lifestyle  Make changes that your doctor recommends.  Eat a healthy diet.  Lose weight if needed.  Avoid alcohol, medicines to help you relax, and some pain medicines.  Do not use any products that contain nicotine or tobacco, such as cigarettes, e-cigarettes, and chewing tobacco. If you need help quitting, ask your doctor. General instructions  Take over-the-counter and prescription medicines only as told by your doctor.  If you were given  a machine to use while you sleep, use it only as told by your doctor.  If you are having surgery, make sure to tell your doctor you have sleep apnea. You may need to bring your device with you.  Keep all follow-up visits as told by your doctor. This is important. Contact a doctor if:  The machine that you were given to use during sleep bothers you or does not seem to be working.  You do not get better.  You get worse. Get help right away if:  Your chest hurts.  You have trouble breathing in enough air.  You have an uncomfortable feeling in your back, arms, or stomach.  You have trouble talking.  One side of your body feels weak.  A part of your face is hanging down. These symptoms may be an emergency. Do not wait to see if the symptoms will go away. Get medical help right away. Call your local emergency services (911 in the [place] Do not drive yourself to the  hospital. Summary  This condition affects breathing during sleep.  The most common cause is a collapsed or blocked airway.  The goal of treatment is to help you breathe normally while you sleep. This information is not intended to replace advice given to you by your health care provider. Make sure you discuss any questions you have with your health care provider. Document Released: 10/14/2007 Document Revised: 08/30/2017 Document Reviewed: 08/30/2017 Elsevier Interactive Patient Education  [employer].

## 2018-03-03 ENCOUNTER — Other Ambulatory Visit: Payer: Self-pay

## 2018-03-03 DIAGNOSIS — H401113 Primary open-angle glaucoma, right eye, severe stage: Secondary | ICD-10-CM | POA: Diagnosis not present

## 2018-03-03 MED ORDER — MONTELUKAST SODIUM 10 MG PO TABS: 10.0000 mg | ORAL_TABLET | Freq: Every day | ORAL | 2 refills | Status: DC

## 2018-03-13 ENCOUNTER — Encounter: Payer: Self-pay | Admitting: Adult Health

## 2018-03-21 ENCOUNTER — Other Ambulatory Visit (INDEPENDENT_AMBULATORY_CARE_PROVIDER_SITE_OTHER): Payer: BC Managed Care – PPO | Admitting: Internal Medicine

## 2018-03-21 DIAGNOSIS — G4733 Obstructive sleep apnea (adult) (pediatric): Secondary | ICD-10-CM | POA: Diagnosis not present

## 2018-03-31 ENCOUNTER — Other Ambulatory Visit: Payer: Self-pay

## 2018-04-02 NOTE — Procedures (Signed)
[facility] [address] [place], [place] [address]  Patient Name: Frances Greene DOB: June 26, 1945   SLEEP STUDY INTERPRETATION  DATE OF SERVICE: March 21, 2018   SLEEP STUDY HISTORY: This patient is referred to the sleep lab for a baseline Polysomnography. Pertinent history includes a history of diagnosis of excessive daytime somnolence and snoring.  PROCEDURE: This overnight polysomnogram was performed using the Alice 5 acquisition system using the standard diagnostic protocol as outlined by the AASM. This includes 6 channels of EEG, 2 channelscannels of EOG, chin EMG, bilateral anterior tibialis EMG, nasal/oral thermister, PTAF, chest and abdominal wall movements, ECG and pulse oximetry. Apneas and Hypopneas were scored per AASM definition.  SLEEP ARCHITECHTURE: This is a baseline polysomnograph  study. The total recording time was 376.9 minutes and the patients total sleep time is noted to be 300.5 minutes. Sleep onset latency was 36.0 minutes and is prolonged.  Stage R sleep onset latency was 247.5 minutes. Sleep maintenance efficiency was 80.0 % and is reduced.  Sleep staging expressed as a percentage of total sleep time demonstrated 20.6 % N1, 72.0 % N2 and 0 % N3  sleep. Stage R represents 7.3 % of total sleep time. This is decreased.  There were a total of 216 arousals  for an overall arousal index of 43.1 per hour of sleep. PLMS arousal are noted. Arousals without respiratory events are  noted. This can contribute to sleep architechture disruption.  RESPIRATORY MONITORING:   Patient exhibits significant evidence of sleep disorderd breathing characterized by 1 central apneas, 0 obstructive apneas and 0 mixed apneas. There were 175 obstructive hypopneas and 18 RERAs. Most of the apneas/hypopneas were of obstructive variety. The total apnea hypopnea index (apneas and hypopneas per hour of sleep) is 35.1 respiratory events per hour and is severe.  Respiratory  monitoring demonstrated very mild snoring through the night. There are a total of 35 snoring episodes representing 1.7 % of sleep.   Baseline oxygen saturation during wakefulness was 93 % and during NREM sleep averaged 3 % through the night. Arterial saturation during REM sleep was 95 % through the night. There is significant  oxygen desaturation with the respiratory events. Arterial oxygen desaturation occurred of at least 4% was noted with a low saturation of 84 %. The study was performed off oxygen.  CARDIAC MONITORING:   Average heart rate is 109 during sleep with a high of 170 beats per minute. Malignant arrhythmias were not noted other than brief tachycardia.    IMPRESSIONS:  --This overnight polysomnogram demonstrates presence of severe obstructive sleep apnea with an overall AHI 35.1 per hour. --The overall AHI was no worse  during Stage R. --There were associated some arterial oxygen desaturations noted down to 84% --There was significant PLMS noted in this study. --There is very mild snoring noted throughout the study.    RECOMMENDATIONS:  --CPAP titration study is indicated in this case to determine optimal response to therapy. --Nasal decongestants and antihistamines may be of help for increased upper airways resistance when present. --Weight loss through dietary and lifestyle modification is recommended in the presence of obesity. --A search for and treatment of any underlying cardiopulmonary disease is      recommended in the presence of oxygen desaturations. --Alternative treatment options if the patient is not willing to use CPAP include oral   appliances as well as surgical intervention which may help in the appropriate patient. --Clinical correlation is recommended. Please feel free to call the office for  any further  questions or assistance in the care of this patient.     Allyne Gee, MD [facility] Pulmonary Critical Care Medicine Sleep medicine

## 2018-04-06 ENCOUNTER — Ambulatory Visit: Payer: Self-pay | Admitting: Internal Medicine

## 2018-04-13 ENCOUNTER — Ambulatory Visit: Payer: Self-pay | Admitting: Adult Health

## 2018-04-18 ENCOUNTER — Encounter: Payer: Self-pay | Admitting: Internal Medicine

## 2018-04-18 ENCOUNTER — Other Ambulatory Visit: Payer: Self-pay

## 2018-04-18 ENCOUNTER — Ambulatory Visit (INDEPENDENT_AMBULATORY_CARE_PROVIDER_SITE_OTHER): Payer: BC Managed Care – PPO | Admitting: Internal Medicine

## 2018-04-18 DIAGNOSIS — G4733 Obstructive sleep apnea (adult) (pediatric): Secondary | ICD-10-CM | POA: Diagnosis not present

## 2018-04-18 DIAGNOSIS — N183 Chronic kidney disease, stage 3 unspecified: Secondary | ICD-10-CM

## 2018-04-18 DIAGNOSIS — I10 Essential (primary) hypertension: Secondary | ICD-10-CM | POA: Diagnosis not present

## 2018-04-18 NOTE — Progress Notes (Signed)
[facility] [place], [place] [address]  Internal MEDICINE  Office Visit Note  Patient Name: Frances Greene  [id number]  [id number]  Date of Service: 04/20/2018  Chief Complaint  Patient presents with  . Sleep Apnea    sleep study follow up   . Hypertension  . Chronic Kidney Disease    follow up   . Diabetes   HPI She has h/o OSA, recent study was done due to worsening in her symptoms and to look into progression of her disease. She is c/o BLE.  --This overnight polysomnogram demonstrates presence of severe obstructive sleep apnea with an overall AHI 35.1 per hour. --The overall AHI was no worse  during Stage R. --There were associated some arterial oxygen desaturations noted down to 84% --There was significant PLMS noted in this study. --There is very mild snoring noted throughout the study.  Current Medication: Outpatient Encounter Medications as of 04/18/2018  Medication Sig Note  . allopurinol (ZYLOPRIM) 300 MG tablet TAKE ONE TAB AT NIGHT FOR GOUT   . aspirin EC 325 MG tablet Take 325 mg by mouth. 01/23/2016: Received from: [facility]: Take 325 mg by mouth.  Marland Kitchen atorvastatin (LIPITOR) 20 MG tablet Take 1 tablet (20 mg total) by mouth daily.   . bisoprolol-hydrochlorothiazide (ZIAC) 2.5-6.25 MG tablet Take 1 tablet by mouth 2 (two) times daily.   . Cholecalciferol (D3-1000) 1000 units tablet Take 1,000 Units by mouth daily.   . citalopram (CELEXA) 20 MG tablet Take 20 mg by mouth daily.   . diphenhydrAMINE (ALLERGY) 25 MG tablet Take 25 mg by mouth daily.   . Ferrous Sulfate 134 MG TABS Take 1 tablet by mouth daily.   . folic acid (FOLVITE) 564 MCG tablet Take 400 mcg by mouth daily.   . furosemide (LASIX) 40 MG tablet TAKE 1 TABLET BY MOUTH ONCE DAILY AND  TAKE  XTRA  TAB  IF  NEEDED   . Garlic 3329 MG CAPS Take by mouth. 01/23/2016: Received from: [facility]: Take by mouth.  Marland Kitchen glimepiride (AMARYL) 1  MG tablet TAKE 1 TABLET BY MOUTH ONCE DAILY   . glucose blood test strip 1 each by Other route as needed for other. Use as instructed   . hydrALAZINE (APRESOLINE) 25 MG tablet TAKE 1 TABLET BY MOUTH THREE TIMES DAILY FOR BLOOD PRESSURE   . HYDROcodone-acetaminophen (NORCO/VICODIN) 5-325 MG tablet Take 1 tablet by mouth every 6 (six) hours as needed for moderate pain or severe pain.   Marland Kitchen ketoconazole (NIZORAL) 2 % shampoo  01/23/2016: Received from: [facility]  . linaclotide (LINZESS) 145 MCG CAPS capsule Take 1 capsule (145 mcg total) by mouth daily before breakfast.   . meloxicam (MOBIC) 7.5 MG tablet Take 1 tablet (7.5 mg total) by mouth daily. Take one tab po qd for gout and arthritis   . montelukast (SINGULAIR) 10 MG tablet Take 1 tablet (10 mg total) by mouth daily.   . Omega-3 Fatty Acids (FISH OIL PO) Take by mouth. 01/23/2016: Received from: [facility]: Take 1 capsule by mouth 2 (two) times daily.  . polyethylene glycol (MIRALAX / GLYCOLAX) packet Take 17 g by mouth daily as needed.   . Sulfacetamide Sodium (SODIUM SULFACETAMIDE) 10 % SHAM Apply topically.   Marland Kitchen VICTOZA 18 MG/3ML SOPN INJECT 1.8 MG ONCE DAILY SUBCUTANEOUSLY   . Vitamin D, Ergocalciferol, (DRISDOL) 50000 units CAPS capsule  01/23/2016: Received from:  [facility]   No facility-administered encounter medications on file as of 04/18/2018.     Surgical History: Past Surgical History:  Procedure Laterality Date  . ABDOMINAL HYSTERECTOMY    . BREAST BIOPSY Left 1989   neg  . CATARACT EXTRACTION W/PHACO Right 05/21/2015   Procedure: CATARACT EXTRACTION PHACO AND INTRAOCULAR LENS PLACEMENT (IOC);  Surgeon: Milus Height, MD;  Location: [facility] ORS;  Service: Ophthalmology;  Laterality: Right;  [place]: AP%: 10.0 CDE: 9.53  . COLONOSCOPY    . JOINT REPLACEMENT Bilateral    knee  . PARS PLANA VITRECTOMY Right 05/21/2015   Procedure: PARS PLANA VITRECTOMY WITH 25 GAUGE;   Surgeon: Milus Height, MD;  Location: [facility] ORS;  Service: Ophthalmology;  Laterality: Right;  Lot # [id number] H Endo laser: [place]: 300 Pulse Duration: 200 Total Pulses:143   . shoulder surgery      Medical History: Past Medical History:  Diagnosis Date  . Arthritis   . Bronchitis   . CHF (congestive heart failure) ([place])   . Chronic kidney disease    decreased kidney function  . Diabetes mellitus without complication ([place])   . Gout   . Hypertension   . Multiple allergies   . Shortness of breath dyspnea    w/ exertion  . Sleep apnea    CPAP  . Swelling of both lower extremities   . Wheezing     Family History: Family History  Problem Relation Age of Onset  . Breast cancer Cousin   . Breast cancer Other     Social History   Socioeconomic History  . Marital status: Divorced    Spouse name: Not on file  . Number of children: Not on file  . Years of education: Not on file  . Highest education level: Not on file  Occupational History  . Not on file  Social Needs  . Financial resource strain: Not on file  . Food insecurity:    Worry: Not on file    Inability: Not on file  . Transportation needs:    Medical: Not on file    Non-medical: Not on file  Tobacco Use  . Smoking status: Former Smoker    Types: Cigarettes  . Smokeless tobacco: Never Used  . Tobacco comment: 4 cigarettes quit 20 years ago  Substance and Sexual Activity  . Alcohol use: No  . Drug use: Not on file  . Sexual activity: Not on file  Lifestyle  . Physical activity:    Days per week: Not on file    Minutes per session: Not on file  . Stress: Not on file  Relationships  . Social connections:    Talks on phone: Not on file    Gets together: Not on file    Attends religious service: Not on file    Active member of club or organization: Not on file    Attends meetings of clubs or organizations: Not on file    Relationship status: Not on file  . Intimate partner violence:    Fear of  current or ex partner: Not on file    Emotionally abused: Not on file    Physically abused: Not on file    Forced sexual activity: Not on file  Other Topics Concern  . Not on file  Social History Narrative  . Not on file      Review of Systems  Vital Signs: BP 136/80   Pulse 75   Resp 16   Ht 5\' 3"  (1.6  m)   Wt 230 lb (104.3 kg)   SpO2 95%   BMI 40.74 kg/m    Physical Exam Constitutional:      General: She is not in acute distress.    Appearance: She is well-developed. She is not diaphoretic.  HENT:     Head: Normocephalic and atraumatic.     Mouth/Throat:     Pharynx: No oropharyngeal exudate.  Eyes:     Pupils: Pupils are equal, round, and reactive to light.  Neck:     Musculoskeletal: Normal range of motion and neck supple.     Thyroid: No thyromegaly.     Vascular: No JVD.     Trachea: No tracheal deviation.  Cardiovascular:     Rate and Rhythm: Normal rate and regular rhythm.     Heart sounds: Normal heart sounds. No murmur. No friction rub. No gallop.   Pulmonary:     Effort: Pulmonary effort is normal. No respiratory distress.     Breath sounds: No wheezing or rales.  Chest:     Chest wall: No tenderness.  Abdominal:     General: Bowel sounds are normal.     Palpations: Abdomen is soft.  Musculoskeletal: Normal range of motion.     Right lower leg: Edema present.     Left lower leg: Edema present.  Lymphadenopathy:     Cervical: No cervical adenopathy.  Skin:    General: Skin is warm and dry.  Neurological:     Mental Status: She is alert and oriented to person, place, and time.     Cranial Nerves: No cranial nerve deficit.  Psychiatric:        Behavior: Behavior normal.        Thought Content: Thought content normal.        Judgment: Judgment normal.    Assessment/Plan: 1. OSA (obstructive sleep apnea) - Pt will be scheduled for autopap pressures of 5- 14 cm H2O  2. Essential hypertension, benign - Continue meds  - Basic metabolic  panel  3. Stage 3 chronic kidney disease ([place]) - Monitor renal functions   4. Morbid obesity ([place]) - Encouraged wweight loss   General Counseling: Vincie verbalizes understanding of the findings of todays visit and agrees with plan of treatment. I have discussed any further diagnostic evaluation that may be needed or ordered today. We also reviewed her medications today. she has been encouraged to call the office with any questions or concerns that should arise related to todays visit.   Orders Placed This Encounter  Procedures  . Basic metabolic panel  -- Auto- CPAP Titration is ordered ( 5- 15 cm H2O)  Time spent: 25 Minutes  Dr Lavera Guise Internal medicine

## 2018-04-21 LAB — BASIC METABOLIC PANEL
BUN/Creatinine Ratio: 18 (ref 12–28)
BUN: 30 mg/dL — ABNORMAL HIGH (ref 8–27)
CALCIUM: 11.5 mg/dL — AB (ref 8.7–10.3)
CO2: 23 mmol/L (ref 20–29)
CREATININE: 1.68 mg/dL — AB (ref 0.57–1.00)
Chloride: 104 mmol/L (ref 96–106)
GFR calc Af Amer: 34 mL/min/{1.73_m2} — ABNORMAL LOW (ref >59)
GFR, EST NON AFRICAN AMERICAN: 30 mL/min/{1.73_m2} — AB (ref >59)
GLUCOSE: 124 mg/dL — AB (ref 65–99)
Potassium: 4.2 mmol/L (ref 3.5–5.2)
SODIUM: 145 mmol/L — AB (ref 134–144)

## 2018-04-24 ENCOUNTER — Ambulatory Visit: Payer: Self-pay | Admitting: Adult Health

## 2018-05-02 ENCOUNTER — Ambulatory Visit: Payer: Self-pay | Admitting: Adult Health

## 2018-05-04 ENCOUNTER — Ambulatory Visit: Payer: Self-pay | Admitting: Internal Medicine

## 2018-05-10 ENCOUNTER — Ambulatory Visit: Payer: BC Managed Care – PPO

## 2018-05-16 ENCOUNTER — Ambulatory Visit: Payer: Self-pay | Admitting: Adult Health

## 2018-05-31 ENCOUNTER — Telehealth: Payer: Self-pay

## 2018-05-31 ENCOUNTER — Ambulatory Visit: Payer: BC Managed Care – PPO

## 2018-05-31 ENCOUNTER — Other Ambulatory Visit: Payer: Self-pay | Admitting: Internal Medicine

## 2018-05-31 MED ORDER — HYDRALAZINE HCL 25 MG PO TABS: ORAL_TABLET | ORAL | 0 refills | Status: DC

## 2018-05-31 NOTE — Telephone Encounter (Signed)
Gave [facility] order RX for new cpap auto pap set up and will call patient once approved and put copy in scan. Beth

## 2018-06-05 ENCOUNTER — Other Ambulatory Visit: Payer: Self-pay

## 2018-06-05 MED ORDER — HYDRALAZINE HCL 25 MG PO TABS: ORAL_TABLET | ORAL | 0 refills | Status: DC

## 2018-06-06 ENCOUNTER — Telehealth: Payer: Self-pay

## 2018-06-07 ENCOUNTER — Other Ambulatory Visit: Payer: Self-pay | Admitting: Internal Medicine

## 2018-06-20 ENCOUNTER — Ambulatory Visit (INDEPENDENT_AMBULATORY_CARE_PROVIDER_SITE_OTHER): Payer: BC Managed Care – PPO | Admitting: Internal Medicine

## 2018-06-20 ENCOUNTER — Encounter: Payer: Self-pay | Admitting: Internal Medicine

## 2018-06-20 DIAGNOSIS — R300 Dysuria: Secondary | ICD-10-CM

## 2018-06-20 DIAGNOSIS — Z0001 Encounter for general adult medical examination with abnormal findings: Secondary | ICD-10-CM | POA: Diagnosis not present

## 2018-06-20 DIAGNOSIS — E782 Mixed hyperlipidemia: Secondary | ICD-10-CM

## 2018-06-20 DIAGNOSIS — E1122 Type 2 diabetes mellitus with diabetic chronic kidney disease: Secondary | ICD-10-CM | POA: Diagnosis not present

## 2018-06-20 DIAGNOSIS — N183 Chronic kidney disease, stage 3 unspecified: Secondary | ICD-10-CM

## 2018-06-20 DIAGNOSIS — K5904 Chronic idiopathic constipation: Secondary | ICD-10-CM | POA: Diagnosis not present

## 2018-06-20 DIAGNOSIS — N186 End stage renal disease: Secondary | ICD-10-CM

## 2018-06-20 DIAGNOSIS — I10 Essential (primary) hypertension: Secondary | ICD-10-CM | POA: Diagnosis not present

## 2018-06-20 LAB — POCT GLYCOSYLATED HEMOGLOBIN (HGB A1C): Hemoglobin A1C: 6.3 % — AB (ref 4.0–5.6)

## 2018-06-20 NOTE — Progress Notes (Signed)
[facility] [place], [place] [address]  Internal MEDICINE  Office Visit Note  Patient Name: Frances Greene  [id number]  [id number]  Date of Service: 06/20/2018  Chief Complaint  Patient presents with  . Annual Exam  . Medical Management of Chronic Issues  . Hypertension  . Diabetes   HPI Pt is here for routine health maintenance examination. Pt is in chronic stable condition, feels well, kidney functions are slightly worse. Calcium is elevated, needs diabetic foot exam.  Current Medication: Outpatient Encounter Medications as of 06/20/2018  Medication Sig Note  . allopurinol (ZYLOPRIM) 300 MG tablet TAKE 1 TABLET BY MOUTH AT NIGHT FOR GOUT   . aspirin EC 325 MG tablet Take 325 mg by mouth. 01/23/2016: Received from: [place]: Take 325 mg by mouth.  Marland Kitchen atorvastatin (LIPITOR) 20 MG tablet Take 1 tablet by mouth once daily   . bisoprolol-hydrochlorothiazide (ZIAC) 2.5-6.25 MG tablet Take 1 tablet by mouth twice daily   . Cholecalciferol (D3-1000) 1000 units tablet Take 1,000 Units by mouth daily.   . citalopram (CELEXA) 20 MG tablet Take 20 mg by mouth daily.   . diphenhydrAMINE (ALLERGY) 25 MG tablet Take 25 mg by mouth daily.   . Ferrous Sulfate 134 MG TABS Take 1 tablet by mouth daily.   . folic acid (FOLVITE) 798 MCG tablet Take 400 mcg by mouth daily.   . furosemide (LASIX) 40 MG tablet TAKE 1 TABLET BY MOUTH ONCE DAILY AND  TAKE  XTRA  TAB  IF  NEEDED   . Garlic 9211 MG CAPS Take by mouth. 01/23/2016: Received from: [place]: Take by mouth.  Marland Kitchen glimepiride (AMARYL) 1 MG tablet TAKE 1 TABLET BY MOUTH ONCE DAILY   . glucose blood test strip 1 each by Other route as needed for other. Use as instructed   . hydrALAZINE (APRESOLINE) 25 MG tablet TAKE 1 TABLET BY MOUTH THREE TIMES DAILY FOR BLOOD PRESSURE   . HYDROcodone-acetaminophen (NORCO/VICODIN) 5-325 MG tablet Take 1 tablet by mouth every 6 (six) hours  as needed for moderate pain or severe pain.   Marland Kitchen ketoconazole (NIZORAL) 2 % shampoo  01/23/2016: Received from: [facility]  . linaclotide (LINZESS) 145 MCG CAPS capsule Take 1 capsule (145 mcg total) by mouth daily before breakfast.   . meloxicam (MOBIC) 7.5 MG tablet TAKE 1 TABLET BY MOUTH ONCE DAILY FOR  GOUT  AND  ARTHRITIS   . montelukast (SINGULAIR) 10 MG tablet Take 1 tablet (10 mg total) by mouth daily.   . Omega-3 Fatty Acids (FISH OIL PO) Take by mouth. 01/23/2016: Received from: [place]: Take 1 capsule by mouth 2 (two) times daily.  . polyethylene glycol (MIRALAX / GLYCOLAX) packet Take 17 g by mouth daily as needed.   . Sulfacetamide Sodium (SODIUM SULFACETAMIDE) 10 % SHAM Apply topically.   Marland Kitchen VICTOZA 18 MG/3ML SOPN INJECT 1.8 MG ONCE DAILY SUBCUTANEOUSLY   . Vitamin D, Ergocalciferol, (DRISDOL) 50000 units CAPS capsule  01/23/2016: Received from: [facility]   No facility-administered encounter medications on file as of 06/20/2018.     Surgical History: Past Surgical History:  Procedure Laterality Date  . ABDOMINAL HYSTERECTOMY    . BREAST BIOPSY Left 1989   neg  . CATARACT EXTRACTION W/PHACO Right 05/21/2015   Procedure: CATARACT EXTRACTION PHACO AND INTRAOCULAR LENS PLACEMENT (IOC);  Surgeon: Milus Height, MD;  Location: [facility] ORS;  Service:  Ophthalmology;  Laterality: Right;  [place]: AP%: 10.0 CDE: 9.53  . COLONOSCOPY    . JOINT REPLACEMENT Bilateral    knee  . PARS PLANA VITRECTOMY Right 05/21/2015   Procedure: PARS PLANA VITRECTOMY WITH 25 GAUGE;  Surgeon: Milus Height, MD;  Location: [facility] ORS;  Service: Ophthalmology;  Laterality: Right;  Lot # [id number] Endo laser: [place]: 300 Pulse Duration: 200 Total Pulses:143   . shoulder surgery      Medical History: Past Medical History:  Diagnosis Date  . Arthritis   . Bronchitis   . CHF (congestive heart failure) ([place])   . Chronic kidney disease     decreased kidney function  . Diabetes mellitus without complication ([place])   . Gout   . Hypertension   . Multiple allergies   . Shortness of breath dyspnea    w/ exertion  . Sleep apnea    CPAP  . Swelling of both lower extremities   . Wheezing     Family History: Family History  Problem Relation Age of Onset  . Breast cancer Cousin   . Breast cancer Other    Review of Systems  Constitutional: Negative for chills, diaphoresis and fatigue.  HENT: Negative for ear pain, postnasal drip and sinus pressure.   Eyes: Negative for photophobia, discharge, redness, itching and visual disturbance.  Respiratory: Negative for cough, shortness of breath and wheezing.   Cardiovascular: Negative for chest pain, palpitations and leg swelling.  Gastrointestinal: Negative for abdominal pain, constipation, diarrhea, nausea and vomiting.  Genitourinary: Negative for dysuria and flank pain.  Musculoskeletal: Negative for arthralgias, back pain, gait problem and neck pain.  Skin: Negative for color change.  Allergic/Immunologic: Negative for environmental allergies and food allergies.  Neurological: Negative for dizziness and headaches.  Hematological: Does not bruise/bleed easily.  Psychiatric/Behavioral: Negative for agitation, behavioral problems (depression) and hallucinations.   Vital Signs: BP (!) 149/74   Pulse 73   Ht 5\' 3"  (1.6 m)   Wt 230 lb (104.3 kg)   SpO2 98%   BMI 40.74 kg/m   Physical Exam Constitutional:      General: She is not in acute distress.    Appearance: She is well-developed. She is not diaphoretic.  HENT:     Head: Normocephalic and atraumatic.     Mouth/Throat:     Pharynx: No oropharyngeal exudate.  Eyes:     Pupils: Pupils are equal, round, and reactive to light.  Neck:     Musculoskeletal: Normal range of motion and neck supple.     Thyroid: No thyromegaly.     Vascular: No JVD.     Trachea: No tracheal deviation.  Cardiovascular:     Rate and Rhythm:  Normal rate and regular rhythm.     Heart sounds: Normal heart sounds. No murmur. No friction rub. No gallop.   Pulmonary:     Effort: Pulmonary effort is normal. No respiratory distress.     Breath sounds: No wheezing or rales.  Chest:     Chest wall: No tenderness.     Breasts: Breasts are symmetrical.        Right: Normal. No swelling.        Left: Normal. No swelling.  Abdominal:     General: Bowel sounds are normal.     Palpations: Abdomen is soft.  Musculoskeletal: Normal range of motion.  Lymphadenopathy:     Cervical: No cervical adenopathy.  Skin:    General: Skin is warm and dry.  Neurological:  Mental Status: She is alert and oriented to person, place, and time.     Cranial Nerves: No cranial nerve deficit.  Psychiatric:        Behavior: Behavior normal.        Thought Content: Thought content normal.        Judgment: Judgment normal.    LABS: Recent Results (from the past 2160 hour(s))  Basic metabolic panel     Status: Abnormal   Collection Time: 04/20/18  4:47 PM  Result Value Ref Range   Glucose 124 (H) 65 - 99 mg/dL   BUN 30 (H) 8 - 27 mg/dL   Creatinine, Ser 1.68 (H) 0.57 - 1.00 mg/dL   GFR calc non Af Amer 30 (L) >59 mL/min/1.73   GFR calc Af Amer 34 (L) >59 mL/min/1.73   BUN/Creatinine Ratio 18 12 - 28   Sodium 145 (H) 134 - 144 mmol/L   Potassium 4.2 3.5 - 5.2 mmol/L   Chloride 104 96 - 106 mmol/L   CO2 23 20 - 29 mmol/L   Calcium 11.5 (H) 8.7 - 10.3 mg/dL  POCT HgB A1C     Status: Abnormal   Collection Time: 06/20/18 12:13 PM  Result Value Ref Range   Hemoglobin A1C 6.3 (A) 4.0 - 5.6 %   HbA1c POC (<> result, manual entry)     HbA1c, POC (prediabetic range)     HbA1c, POC (controlled diabetic range)     Assessment/Plan: 1. Encounter for general adult medical examination with abnormal findings - Update PHM  2. Type 2 diabetes mellitus with end-stage renal disease (HCC) - Continue all diabetic meds as before  - POCT HgB A1C - Ambulatory  referral to Nephrology/ podiatry  - Comprehensive metabolic panel; Future - Microalbumin / creatinine urine ratio  3. Stage 3 chronic kidney disease ([place]) - Ambulatory referral to Nephrology - Ambulatory referral to Podiatry - CBC with Differential/Platelet; Future - Lipid Panel With LDL/HDL Ratio; Future - TSH; Future - T4, free; Future - Comprehensive metabolic panel; Future  4. Essential hypertension, benign - Continue all meds a before   5. Chronic idiopathic constipation - Samples of Linzess is given   6. Mixed hyperlipidemia - Controlled   7. Dysuria - Urinalysis, Routine w reflex microscopic  General Counseling: Tobin verbalizes understanding of the findings of todays visit and agrees with plan of treatment. I have discussed any further diagnostic evaluation that may be needed or ordered today. We also reviewed her medications today. she has been encouraged to call the office with any questions or concerns that should arise related to todays visit.  Orders Placed This Encounter  Procedures  . Urinalysis, Routine w reflex microscopic  . POCT HgB A1C    Time spent:25 [place], MD  Internal Medicine

## 2018-06-21 ENCOUNTER — Other Ambulatory Visit: Payer: Self-pay

## 2018-06-21 ENCOUNTER — Ambulatory Visit: Payer: BC Managed Care – PPO

## 2018-06-21 ENCOUNTER — Ambulatory Visit (INDEPENDENT_AMBULATORY_CARE_PROVIDER_SITE_OTHER): Payer: BC Managed Care – PPO

## 2018-06-21 DIAGNOSIS — G4733 Obstructive sleep apnea (adult) (pediatric): Secondary | ICD-10-CM

## 2018-06-21 LAB — URINALYSIS, ROUTINE W REFLEX MICROSCOPIC
Bilirubin, UA: NEGATIVE
Glucose, UA: NEGATIVE
Ketones, UA: NEGATIVE
Leukocytes,UA: NEGATIVE
Nitrite, UA: NEGATIVE
Protein,UA: NEGATIVE
RBC, UA: NEGATIVE
Specific Gravity, UA: 1.008 (ref 1.005–1.030)
Urobilinogen, Ur: 0.2 mg/dL (ref 0.2–1.0)
pH, UA: 5.5 (ref 5.0–7.5)

## 2018-06-21 NOTE — Progress Notes (Signed)
95 percentile pressure 13.5   95th percentile leak 8.9   apnea index 4.3 /hr  apnea-hypopnea index  5.3 /hr   total days used  >4 hr 85 days  total days used <4 hr 4 days  Total compliance 94 percent  Frances Greene doing great checked cpap it is still hold her airway and doing ok. Will check again in 6 months

## 2018-06-21 NOTE — Telephone Encounter (Signed)
Patient came in for sleep clinic and had cpap machine checked. Frances Greene

## 2018-07-17 ENCOUNTER — Encounter: Payer: Self-pay | Admitting: Podiatry

## 2018-07-17 ENCOUNTER — Ambulatory Visit: Payer: Medicare Other | Admitting: Podiatry

## 2018-07-17 ENCOUNTER — Other Ambulatory Visit: Payer: Self-pay

## 2018-07-17 DIAGNOSIS — E1159 Type 2 diabetes mellitus with other circulatory complications: Secondary | ICD-10-CM | POA: Insufficient documentation

## 2018-07-17 DIAGNOSIS — B351 Tinea unguium: Secondary | ICD-10-CM | POA: Diagnosis not present

## 2018-07-17 DIAGNOSIS — M79674 Pain in right toe(s): Secondary | ICD-10-CM

## 2018-07-17 DIAGNOSIS — M79675 Pain in left toe(s): Secondary | ICD-10-CM

## 2018-07-17 NOTE — Progress Notes (Addendum)
This patient presents to the office with chief complaint of long thick nails and diabetic feet.  This patient  says there  is  no pain and discomfort in her  feet.  This patient says there are long thick painful nails.  These nails are painful walking and wearing shoes.  Patient has no history of infection or drainage from both feet.  Patient is unable to  self treat his own nails . This patient presents  to the office today for treatment of the  long nails and a foot evaluation due to history of  diabetes.  General Appearance  Alert, conversant and in no acute stress.  Vascular  Dorsalis pedis and posterior tibial  pulses are not  palpable  Bilaterally due to severe swelling..  Capillary return is within normal limits  bilaterally. Temperature is within normal limits  bilaterally.  Neurologic  Senn-Weinstein monofilament wire test within normal limits  bilaterally. Muscle power within normal limits bilaterally.  Nails Thick disfigured discolored nails with subungual debris  from hallux to fifth toes bilaterally. No evidence of bacterial infection or drainage bilaterally.  Orthopedic  No limitations of motion of motion feet .  No crepitus or effusions noted.  No bony pathology or digital deformities noted.  Skin  normotropic skin with no porokeratosis noted bilaterally.  No signs of infections or ulcers noted.     Onychomycosis  Diabetes with vascular disease   IE  Debride nails x 10.  A diabetic foot exam was performed and there is no evidence of  neurologic pathology.   Severe swelling noted  B/l preventing palpation of pulses  B/L. RTC 3 months. Patient does not qualify for diabetic shoes.   Gardiner Barefoot DPM

## 2018-07-19 ENCOUNTER — Other Ambulatory Visit: Payer: Self-pay | Admitting: Internal Medicine

## 2018-07-24 ENCOUNTER — Other Ambulatory Visit: Payer: Self-pay

## 2018-07-24 DIAGNOSIS — Z0001 Encounter for general adult medical examination with abnormal findings: Secondary | ICD-10-CM

## 2018-07-24 DIAGNOSIS — R300 Dysuria: Secondary | ICD-10-CM

## 2018-07-24 DIAGNOSIS — I10 Essential (primary) hypertension: Secondary | ICD-10-CM

## 2018-07-24 DIAGNOSIS — E1122 Type 2 diabetes mellitus with diabetic chronic kidney disease: Secondary | ICD-10-CM

## 2018-07-24 DIAGNOSIS — N186 End stage renal disease: Secondary | ICD-10-CM

## 2018-07-24 DIAGNOSIS — Z1239 Encounter for other screening for malignant neoplasm of breast: Secondary | ICD-10-CM

## 2018-07-24 DIAGNOSIS — N183 Chronic kidney disease, stage 3 unspecified: Secondary | ICD-10-CM

## 2018-07-25 LAB — CBC WITH DIFFERENTIAL/PLATELET
Basophils Absolute: 0 10*3/uL (ref 0.0–0.2)
Basos: 1 %
EOS (ABSOLUTE): 0.5 10*3/uL — ABNORMAL HIGH (ref 0.0–0.4)
Eos: 9 %
Hematocrit: 33.1 % — ABNORMAL LOW (ref 34.0–46.6)
Hemoglobin: 10.8 g/dL — ABNORMAL LOW (ref 11.1–15.9)
Immature Grans (Abs): 0 10*3/uL (ref 0.0–0.1)
Immature Granulocytes: 1 %
Lymphocytes Absolute: 1.6 10*3/uL (ref 0.7–3.1)
Lymphs: 27 %
MCH: 27.6 pg (ref 26.6–33.0)
MCHC: 32.6 g/dL (ref 31.5–35.7)
MCV: 84 fL (ref 79–97)
Monocytes Absolute: 0.7 10*3/uL (ref 0.1–0.9)
Monocytes: 11 %
Neutrophils Absolute: 3.1 10*3/uL (ref 1.4–7.0)
Neutrophils: 51 %
Platelets: 150 10*3/uL (ref 150–450)
RBC: 3.92 x10E6/uL (ref 3.77–5.28)
RDW: 13.7 % (ref 11.7–15.4)
WBC: 5.9 10*3/uL (ref 3.4–10.8)

## 2018-07-25 LAB — COMPREHENSIVE METABOLIC PANEL
ALT: 7 IU/L (ref 0–32)
AST: 11 IU/L (ref 0–40)
Albumin/Globulin Ratio: 2.3 — ABNORMAL HIGH (ref 1.2–2.2)
Albumin: 4.2 g/dL (ref 3.7–4.7)
Alkaline Phosphatase: 88 IU/L (ref 39–117)
BUN/Creatinine Ratio: 19 (ref 12–28)
BUN: 34 mg/dL — ABNORMAL HIGH (ref 8–27)
Bilirubin Total: 0.2 mg/dL (ref 0.0–1.2)
CO2: 22 mmol/L (ref 20–29)
Calcium: 11 mg/dL — ABNORMAL HIGH (ref 8.7–10.3)
Chloride: 106 mmol/L (ref 96–106)
Creatinine, Ser: 1.77 mg/dL — ABNORMAL HIGH (ref 0.57–1.00)
GFR calc Af Amer: 32 mL/min/{1.73_m2} — ABNORMAL LOW (ref >59)
GFR calc non Af Amer: 28 mL/min/{1.73_m2} — ABNORMAL LOW (ref >59)
Globulin, Total: 1.8 g/dL (ref 1.5–4.5)
Glucose: 122 mg/dL — ABNORMAL HIGH (ref 65–99)
Potassium: 4 mmol/L (ref 3.5–5.2)
Sodium: 143 mmol/L (ref 134–144)
Total Protein: 6 g/dL (ref 6.0–8.5)

## 2018-07-25 LAB — LIPID PANEL WITH LDL/HDL RATIO
Cholesterol, Total: 135 mg/dL (ref 100–199)
HDL: 53 mg/dL (ref >39)
LDL Calculated: 63 mg/dL (ref 0–99)
LDl/HDL Ratio: 1.2 ratio (ref 0.0–3.2)
Triglycerides: 96 mg/dL (ref 0–149)
VLDL Cholesterol Cal: 19 mg/dL (ref 5–40)

## 2018-07-25 LAB — TSH: TSH: 3.9 u[IU]/mL (ref 0.450–4.500)

## 2018-07-25 LAB — T4, FREE: Free T4: 1.07 ng/dL (ref 0.82–1.77)

## 2018-08-03 ENCOUNTER — Other Ambulatory Visit: Payer: Self-pay | Admitting: Internal Medicine

## 2018-08-07 ENCOUNTER — Other Ambulatory Visit: Payer: Self-pay | Admitting: Nephrology

## 2018-08-07 DIAGNOSIS — N183 Chronic kidney disease, stage 3 unspecified: Secondary | ICD-10-CM

## 2018-08-10 ENCOUNTER — Other Ambulatory Visit: Payer: Self-pay

## 2018-08-10 ENCOUNTER — Ambulatory Visit
Admission: RE | Admit: 2018-08-10 | Discharge: 2018-08-10 | Disposition: A | Discharge status: Home / Self Care | Payer: Medicare Other | Source: Ambulatory Visit | Attending: Nephrology | Admitting: Nephrology

## 2018-08-10 DIAGNOSIS — N183 Chronic kidney disease, stage 3 unspecified: Secondary | ICD-10-CM

## 2018-08-29 ENCOUNTER — Other Ambulatory Visit: Payer: Self-pay | Admitting: Nurse Practitioner

## 2018-08-29 MED ORDER — FUROSEMIDE 40 MG PO TABS: ORAL_TABLET | ORAL | 6 refills | Status: DC

## 2018-09-13 ENCOUNTER — Other Ambulatory Visit: Payer: Self-pay | Admitting: Internal Medicine

## 2018-09-13 DIAGNOSIS — Z1231 Encounter for screening mammogram for malignant neoplasm of breast: Secondary | ICD-10-CM

## 2018-09-21 ENCOUNTER — Ambulatory Visit: Payer: BC Managed Care – PPO | Admitting: Nurse Practitioner

## 2018-09-21 ENCOUNTER — Other Ambulatory Visit: Payer: Self-pay | Admitting: Internal Medicine

## 2018-09-21 MED ORDER — BISOPROLOL-HYDROCHLOROTHIAZIDE 2.5-6.25 MG PO TABS: 1.0000 | ORAL_TABLET | Freq: Two times a day (BID) | ORAL | 0 refills | Status: DC

## 2018-09-28 ENCOUNTER — Ambulatory Visit: Payer: BC Managed Care – PPO | Admitting: Internal Medicine

## 2018-10-02 ENCOUNTER — Ambulatory Visit
Admission: RE | Admit: 2018-10-02 | Discharge: 2018-10-02 | Disposition: A | Discharge status: Home / Self Care | Payer: Medicare Other | Source: Ambulatory Visit | Attending: Internal Medicine | Admitting: Internal Medicine

## 2018-10-02 ENCOUNTER — Other Ambulatory Visit: Payer: Self-pay

## 2018-10-02 ENCOUNTER — Encounter (INDEPENDENT_AMBULATORY_CARE_PROVIDER_SITE_OTHER): Payer: Self-pay

## 2018-10-02 DIAGNOSIS — Z1231 Encounter for screening mammogram for malignant neoplasm of breast: Secondary | ICD-10-CM | POA: Diagnosis not present

## 2018-10-02 MED ORDER — GLIMEPIRIDE 1 MG PO TABS: 1.0000 mg | ORAL_TABLET | Freq: Every day | ORAL | 1 refills | Status: DC

## 2018-10-10 ENCOUNTER — Encounter: Payer: Self-pay | Admitting: Internal Medicine

## 2018-10-10 ENCOUNTER — Ambulatory Visit (INDEPENDENT_AMBULATORY_CARE_PROVIDER_SITE_OTHER): Payer: BC Managed Care – PPO | Admitting: Internal Medicine

## 2018-10-10 ENCOUNTER — Other Ambulatory Visit: Payer: Self-pay

## 2018-10-10 VITALS — BP 128/72 | HR 63 | Resp 16 | Ht 63.0 in | Wt 231.0 lb

## 2018-10-10 DIAGNOSIS — G4733 Obstructive sleep apnea (adult) (pediatric): Secondary | ICD-10-CM | POA: Diagnosis not present

## 2018-10-10 DIAGNOSIS — I10 Essential (primary) hypertension: Secondary | ICD-10-CM | POA: Diagnosis not present

## 2018-10-10 DIAGNOSIS — N183 Chronic kidney disease, stage 3 unspecified: Secondary | ICD-10-CM

## 2018-10-10 DIAGNOSIS — Z9989 Dependence on other enabling machines and devices: Secondary | ICD-10-CM

## 2018-10-10 NOTE — Progress Notes (Signed)
[facility] [place], [place] [address]  Pulmonary Sleep Medicine   Office Visit Note  Patient Name: Frances Greene DOB: 1945-07-17 MRN [id number]  Date of Service: 10/10/2018  Complaints/HPI: Pt is here for follow up on osa.  She is current using CPAP nightly.  She denies any issues.  She is cleaning her machine by hand.  She is changing her filters and tubing as directed.  She denies any chest pain, cough, hemoptysis or sob.    ROS  General: (-) fever, (-) chills, (-) night sweats, (-) weakness Skin: (-) rashes, (-) itching,. Eyes: (-) visual changes, (-) redness, (-) itching. Nose and Sinuses: (-) nasal stuffiness or itchiness, (-) postnasal drip, (-) nosebleeds, (-) sinus trouble. Mouth and Throat: (-) sore throat, (-) hoarseness. Neck: (-) swollen glands, (-) enlarged thyroid, (-) neck pain. Respiratory: - cough, (-) bloody sputum, - shortness of breath, - wheezing. Cardiovascular: - ankle swelling, (-) chest pain. Lymphatic: (-) lymph node enlargement. Neurologic: (-) numbness, (-) tingling. Psychiatric: (-) anxiety, (-) depression   Current Medication: Outpatient Encounter Medications as of 10/10/2018  Medication Sig Note  . allopurinol (ZYLOPRIM) 300 MG tablet TAKE 1 TABLET BY MOUTH AT NIGHT FOR GOUT   . aspirin EC 325 MG tablet Take 325 mg by mouth. 01/23/2016: Received from: [place]: Take 325 mg by mouth.  Marland Kitchen atorvastatin (LIPITOR) 20 MG tablet Take 1 tablet by mouth once daily   . bisoprolol-hydrochlorothiazide (ZIAC) 2.5-6.25 MG tablet Take 1 tablet by mouth 2 (two) times daily.   . Cholecalciferol (D3-1000) 1000 units tablet Take 1,000 Units by mouth daily.   . citalopram (CELEXA) 20 MG tablet Take 20 mg by mouth daily.   . diphenhydrAMINE (ALLERGY) 25 MG tablet Take 25 mg by mouth daily.   . Ferrous Sulfate 134 MG TABS Take 1 tablet by mouth daily.   . folic acid (FOLVITE) 607 MCG tablet Take 400 mcg by mouth daily.    . furosemide (LASIX) 40 MG tablet TAKE 1 TABLET BY MOUTH ONCE DAILY AND  TAKE  XTRA  TAB  IF  NEEDED   . Garlic 3710 MG CAPS Take by mouth. 01/23/2016: Received from: [place]: Take by mouth.  Marland Kitchen glimepiride (AMARYL) 1 MG tablet Take 1 tablet (1 mg total) by mouth daily.   . hydrALAZINE (APRESOLINE) 25 MG tablet TAKE 1 TABLET BY MOUTH THREE TIMES DAILY FOR BLOOD PRESSURE   . HYDROcodone-acetaminophen (NORCO/VICODIN) 5-325 MG tablet Take 1 tablet by mouth every 6 (six) hours as needed for moderate pain or severe pain.   Marland Kitchen ketoconazole (NIZORAL) 2 % shampoo SHAMPOO AS DIRECTED   . linaclotide (LINZESS) 145 MCG CAPS capsule Take 1 capsule (145 mcg total) by mouth daily before breakfast.   . losartan (COZAAR) 25 MG tablet Take 25 mg by mouth. Take 1/2 tab   . meloxicam (MOBIC) 7.5 MG tablet TAKE 1 TABLET BY MOUTH ONCE DAILY FOR  GOUT  AND  ARTHRITIS   . montelukast (SINGULAIR) 10 MG tablet Take 1 tablet (10 mg total) by mouth daily.   . Omega-3 Fatty Acids (FISH OIL PO) Take by mouth. 01/23/2016: Received from: [place]: Take 1 capsule by mouth 2 (two) times daily.  Glory Rosebush ULTRA test strip USE TO CHECK BLOOD GLUCOSE TWICE DAILY   . polyethylene glycol (MIRALAX / GLYCOLAX) packet Take 17 g by mouth daily as needed.   . Sulfacetamide Sodium (SODIUM SULFACETAMIDE)  10 % SHAM Apply topically.   Marland Kitchen VICTOZA 18 MG/3ML SOPN INJECT 1.8 MG ONCE DAILY SUBCUTANEOUSLY   . Vitamin D, Ergocalciferol, (DRISDOL) 50000 units CAPS capsule  01/23/2016: Received from: [place]   No facility-administered encounter medications on file as of 10/10/2018.     Surgical History: Past Surgical History:  Procedure Laterality Date  . ABDOMINAL HYSTERECTOMY    . BREAST BIOPSY Left 1989   neg  . CATARACT EXTRACTION W/PHACO Right 05/21/2015   Procedure: CATARACT EXTRACTION PHACO AND INTRAOCULAR LENS PLACEMENT (IOC);  Surgeon: Milus Height,  MD;  Location: [facility] ORS;  Service: Ophthalmology;  Laterality: Right;  [place]: AP%: 10.0 CDE: 9.53  . COLONOSCOPY    . JOINT REPLACEMENT Bilateral    knee  . PARS PLANA VITRECTOMY Right 05/21/2015   Procedure: PARS PLANA VITRECTOMY WITH 25 GAUGE;  Surgeon: Milus Height, MD;  Location: [facility] ORS;  Service: Ophthalmology;  Laterality: Right;  Lot # [id number] H Endo laser: Watts: 300 Pulse Duration: 200 Total Pulses:143   . shoulder surgery      Medical History: Past Medical History:  Diagnosis Date  . Arthritis   . Bronchitis   . CHF (congestive heart failure) ([place])   . Chronic kidney disease    decreased kidney function  . Diabetes mellitus without complication ([place])   . Gout   . Hypertension   . Multiple allergies   . Shortness of breath dyspnea    w/ exertion  . Sleep apnea    CPAP  . Swelling of both lower extremities   . Wheezing     Family History: Family History  Problem Relation Age of Onset  . Breast cancer Cousin   . Breast cancer Other     Social History: Social History   Socioeconomic History  . Marital status: Divorced    Spouse name: Not on file  . Number of children: Not on file  . Years of education: Not on file  . Highest education level: Not on file  Occupational History  . Not on file  Social Needs  . Financial resource strain: Not on file  . Food insecurity    Worry: Not on file    Inability: Not on file  . Transportation needs    Medical: Not on file    Non-medical: Not on file  Tobacco Use  . Smoking status: Former Smoker    Types: Cigarettes  . Smokeless tobacco: Never Used  . Tobacco comment: 4 cigarettes quit 20 years ago  Substance and Sexual Activity  . Alcohol use: No  . Drug use: Never  . Sexual activity: Not on file  Lifestyle  . Physical activity    Days per week: Not on file    Minutes per session: Not on file  . Stress: Not on file  Relationships  . Social Herbalist on phone: Not on file    Gets together:  Not on file    Attends religious service: Not on file    Active member of club or organization: Not on file    Attends meetings of clubs or organizations: Not on file    Relationship status: Not on file  . Intimate partner violence    Fear of current or ex partner: Not on file    Emotionally abused: Not on file    Physically abused: Not on file    Forced sexual activity: Not on file  Other Topics Concern  . Not on file  Social History  Narrative  . Not on file    Vital Signs: Blood pressure 128/72, pulse 63, resp. rate 16, height 5\' 3"  (1.6 m), weight 231 lb (104.8 kg), SpO2 99 %.  Examination: General Appearance: The patient is well-developed, well-nourished, and in no distress. Skin: Gross inspection of skin unremarkable. Head: normocephalic, no gross deformities. Eyes: no gross deformities noted. ENT: ears appear grossly normal no exudates. Neck: Supple. No thyromegaly. No LAD. Respiratory: clear bilaterally. Cardiovascular: Normal S1 and S2 without murmur or rub. Extremities: No cyanosis. pulses are equal. Neurologic: Alert and oriented. No involuntary movements.  LABS: Recent Results (from the past 2160 hour(s))  CBC with Differential/Platelet     Status: Abnormal   Collection Time: 07/24/18 10:39 AM  Result Value Ref Range   WBC 5.9 3.4 - 10.8 x10E3/uL   RBC 3.92 3.77 - 5.28 x10E6/uL   Hemoglobin 10.8 (L) 11.1 - 15.9 g/dL   Hematocrit 33.1 (L) 34.0 - 46.6 %   MCV 84 79 - 97 fL   MCH 27.6 26.6 - 33.0 pg   MCHC 32.6 31.5 - 35.7 g/dL   RDW 13.7 11.7 - 15.4 %   Platelets 150 150 - 450 x10E3/uL   Neutrophils 51 Not Estab. %   Lymphs 27 Not Estab. %   Monocytes 11 Not Estab. %   Eos 9 Not Estab. %   Basos 1 Not Estab. %   Neutrophils Absolute 3.1 1.4 - 7.0 x10E3/uL   Lymphocytes Absolute 1.6 0.7 - 3.1 x10E3/uL   Monocytes Absolute 0.7 0.1 - 0.9 x10E3/uL   EOS (ABSOLUTE) 0.5 (H) 0.0 - 0.4 x10E3/uL   Basophils Absolute 0.0 0.0 - 0.2 x10E3/uL   Immature Granulocytes  1 Not Estab. %   Immature Grans (Abs) 0.0 0.0 - 0.1 x10E3/uL  Comprehensive metabolic panel     Status: Abnormal   Collection Time: 07/24/18 10:39 AM  Result Value Ref Range   Glucose 122 (H) 65 - 99 mg/dL   BUN 34 (H) 8 - 27 mg/dL   Creatinine, Ser 1.77 (H) 0.57 - 1.00 mg/dL   GFR calc non Af Amer 28 (L) >59 mL/min/1.73   GFR calc Af Amer 32 (L) >59 mL/min/1.73   BUN/Creatinine Ratio 19 12 - 28   Sodium 143 134 - 144 mmol/L   Potassium 4.0 3.5 - 5.2 mmol/L   Chloride 106 96 - 106 mmol/L   CO2 22 20 - 29 mmol/L   Calcium 11.0 (H) 8.7 - 10.3 mg/dL   Total Protein 6.0 6.0 - 8.5 g/dL   Albumin 4.2 3.7 - 4.7 g/dL   Globulin, Total 1.8 1.5 - 4.5 g/dL   Albumin/Globulin Ratio 2.3 (H) 1.2 - 2.2   Bilirubin Total 0.2 0.0 - 1.2 mg/dL   Alkaline Phosphatase 88 39 - 117 IU/L   AST 11 0 - 40 IU/L   ALT 7 0 - 32 IU/L  Lipid Panel With LDL/HDL Ratio     Status: None   Collection Time: 07/24/18 10:39 AM  Result Value Ref Range   Cholesterol, Total 135 100 - 199 mg/dL   Triglycerides 96 0 - 149 mg/dL   HDL 53 >39 mg/dL   VLDL Cholesterol Cal 19 5 - 40 mg/dL   LDL Calculated 63 0 - 99 mg/dL   LDl/HDL Ratio 1.2 0.0 - 3.2 ratio    Comment:  LDL/HDL Ratio                                             Men  Women                               1/2 Avg.Risk  1.0    1.5                                   Avg.Risk  3.6    3.2                                2X Avg.Risk  6.2    5.0                                3X Avg.Risk  8.0    6.1   T4, free     Status: None   Collection Time: 07/24/18 10:39 AM  Result Value Ref Range   Free T4 1.07 0.82 - 1.77 ng/dL  TSH     Status: None   Collection Time: 07/24/18 10:39 AM  Result Value Ref Range   TSH 3.900 0.450 - 4.500 uIU/mL    Radiology: Mm 3d Screen Breast Bilateral  Result Date: 10/02/2018 CLINICAL DATA:  Screening. EXAM: DIGITAL SCREENING BILATERAL MAMMOGRAM WITH TOMO AND CAD COMPARISON:  Previous exam(s). ACR  Breast Density Category b: There are scattered areas of fibroglandular density. FINDINGS: There are no findings suspicious for malignancy. Images were processed with CAD. IMPRESSION: No mammographic evidence of malignancy. A result letter of this screening mammogram will be mailed directly to the patient. RECOMMENDATION: Screening mammogram in one year. (Code:SM-B-01Y) BI-RADS CATEGORY  1: Negative. Electronically Signed   By: Curlene Dolphin M.D.   On: 10/02/2018 16:48    No results found.  Mm 3d Screen Breast Bilateral  Result Date: 10/02/2018 CLINICAL DATA:  Screening. EXAM: DIGITAL SCREENING BILATERAL MAMMOGRAM WITH TOMO AND CAD COMPARISON:  Previous exam(s). ACR Breast Density Category b: There are scattered areas of fibroglandular density. FINDINGS: There are no findings suspicious for malignancy. Images were processed with CAD. IMPRESSION: No mammographic evidence of malignancy. A result letter of this screening mammogram will be mailed directly to the patient. RECOMMENDATION: Screening mammogram in one year. (Code:SM-B-01Y) BI-RADS CATEGORY  1: Negative. Electronically Signed   By: Curlene Dolphin M.D.   On: 10/02/2018 16:48      Assessment and Plan: Patient Active Problem List   Diagnosis Date Noted  . Pain due to onychomycosis of toenails of both feet 07/17/2018  . Type 2 diabetes mellitus with vascular disease ([place]) 07/17/2018  . Other fatigue 11/14/2017  . Moderate episode of recurrent major depressive disorder ([place]) 11/14/2017  . Chronic allergic rhinitis 11/14/2017  . Primary insomnia 11/14/2017  . Uncontrolled type 2 diabetes mellitus with hyperglycemia ([place]) 09/18/2017  . Acute pain of right shoulder 09/18/2017  . Type 2 diabetes mellitus with chronic kidney disease ([place]) 01/19/2017  . Anosmia 01/19/2017  . Cervicalgia 01/19/2017  . Vitamin D deficiency 01/19/2017  . Hypoglycemia 01/19/2017  . Obstructive sleep apnea 01/19/2017  . Essential hypertension, benign 01/19/2017  .  Mixed hyperlipidemia 01/19/2017  . Anemia, unspecified 01/19/2017  . Edema 01/19/2017  . SOB (shortness of breath) 01/19/2017  . Gout 01/19/2017  . Morbid obesity ([place]) 01/19/2017  . Stage 3 chronic kidney disease ([place]) 01/19/2017  . Hypercalcemia 01/19/2017  . Iron deficiency anemia 01/14/2016  . Acute pain of right knee 04/23/2015  . Left wrist pain 08/12/2014  . Status post bilateral unicompartmental knee replacement 11/13/2013  . Arthritis, senescent 08/02/2013    1. OSA on CPAP Continue to use cpap nightly as directed.    2. Essential hypertension, benign Stable, continue present management at this time.   3. Stage 3 chronic kidney disease (HCC) Stable, last creatinine was 1.77.   General Counseling: I have discussed the findings of the evaluation and examination with Frances Greene.  I have also discussed any further diagnostic evaluation thatmay be needed or ordered today. Frances Greene verbalizes understanding of the findings of todays visit. We also reviewed her medications today and discussed drug interactions and side effects including but not limited excessive drowsiness and altered mental states. We also discussed that there is always a risk not just to her but also people around her. she has been encouraged to call the office with any questions or concerns that should arise related to todays visit.    Time spent: 15 This patient was seen by Orson Gear AGNP-C in Collaboration with Dr. Devona Konig as a part of collaborative care agreement.   I have personally obtained a history, examined the patient, evaluated laboratory and imaging results, formulated the assessment and plan and placed orders.    Allyne Gee, MD [facility] Pulmonary and Critical Care Sleep medicine

## 2018-10-17 ENCOUNTER — Other Ambulatory Visit: Payer: Self-pay

## 2018-10-17 ENCOUNTER — Ambulatory Visit (INDEPENDENT_AMBULATORY_CARE_PROVIDER_SITE_OTHER): Payer: BC Managed Care – PPO | Admitting: Nurse Practitioner

## 2018-10-17 ENCOUNTER — Encounter: Payer: Self-pay | Admitting: Nurse Practitioner

## 2018-10-17 VITALS — BP 152/70 | HR 73 | Temp 97.3°F | Resp 16 | Ht 63.0 in | Wt 237.0 lb

## 2018-10-17 DIAGNOSIS — E1165 Type 2 diabetes mellitus with hyperglycemia: Secondary | ICD-10-CM

## 2018-10-17 DIAGNOSIS — Z23 Encounter for immunization: Secondary | ICD-10-CM | POA: Diagnosis not present

## 2018-10-17 DIAGNOSIS — I10 Essential (primary) hypertension: Secondary | ICD-10-CM | POA: Diagnosis not present

## 2018-10-17 MED ORDER — LOSARTAN POTASSIUM 50 MG PO TABS: 25.0000 mg | ORAL_TABLET | Freq: Every day | ORAL | 0 refills | Status: DC

## 2018-10-17 NOTE — Progress Notes (Signed)
[facility] [place], [place] [address]  Internal MEDICINE  Office Visit Note  Patient Name: Frances Greene  [id number]  [id number]  Date of Service: 10/28/2018  Chief Complaint  Patient presents with  . Diabetes  . Hypertension  . Quality Metric Gaps    diabetic eye and foot??? exam  . Medication Management    believes she is on the wrong dose of losartan    The patient is here for follow up visit. She is diabetic. Blood sugars are stable. She has had elevated blood calcium levels on routine labs. She was supposed to have a referral to endocrinology at her last visit. She states that she had never gotten phone call to schedule this appointment. She is due to have flu shot today. She has no new concerns or complaints.       Current Medication: Outpatient Encounter Medications as of 10/17/2018  Medication Sig Note  . allopurinol (ZYLOPRIM) 300 MG tablet TAKE 1 TABLET BY MOUTH AT NIGHT FOR GOUT   . aspirin EC 325 MG tablet Take 325 mg by mouth. 01/23/2016: Received from: [place]: Take 325 mg by mouth.  Marland Kitchen atorvastatin (LIPITOR) 20 MG tablet Take 1 tablet by mouth once daily   . bisoprolol-hydrochlorothiazide (ZIAC) 2.5-6.25 MG tablet Take 1 tablet by mouth 2 (two) times daily.   . Cholecalciferol (D3-1000) 1000 units tablet Take 1,000 Units by mouth daily.   . citalopram (CELEXA) 20 MG tablet Take 20 mg by mouth daily.   . diphenhydrAMINE (ALLERGY) 25 MG tablet Take 25 mg by mouth daily.   . Ferrous Sulfate 134 MG TABS Take 1 tablet by mouth daily.   . folic acid (FOLVITE) 967 MCG tablet Take 400 mcg by mouth daily.   . furosemide (LASIX) 40 MG tablet TAKE 1 TABLET BY MOUTH ONCE DAILY AND  TAKE  XTRA  TAB  IF  NEEDED   . Garlic 8938 MG CAPS Take by mouth. 01/23/2016: Received from: [place]: Take by mouth.  Marland Kitchen glimepiride (AMARYL) 1 MG tablet Take 1 tablet (1 mg total) by mouth daily.   . hydrALAZINE  (APRESOLINE) 25 MG tablet TAKE 1 TABLET BY MOUTH THREE TIMES DAILY FOR BLOOD PRESSURE   . HYDROcodone-acetaminophen (NORCO/VICODIN) 5-325 MG tablet Take 1 tablet by mouth every 6 (six) hours as needed for moderate pain or severe pain.   Marland Kitchen ketoconazole (NIZORAL) 2 % shampoo SHAMPOO AS DIRECTED   . linaclotide (LINZESS) 145 MCG CAPS capsule Take 1 capsule (145 mcg total) by mouth daily before breakfast.   . losartan (COZAAR) 50 MG tablet Take 0.5 tablets (25 mg total) by mouth daily. Take 1/2 tab   . meloxicam (MOBIC) 7.5 MG tablet TAKE 1 TABLET BY MOUTH ONCE DAILY FOR  GOUT  AND  ARTHRITIS   . montelukast (SINGULAIR) 10 MG tablet Take 1 tablet (10 mg total) by mouth daily.   . Omega-3 Fatty Acids (FISH OIL PO) Take by mouth. 01/23/2016: Received from: [place]: Take 1 capsule by mouth 2 (two) times daily.  Glory Rosebush ULTRA test strip USE TO CHECK BLOOD GLUCOSE TWICE DAILY   . polyethylene glycol (MIRALAX / GLYCOLAX) packet Take 17 g by mouth daily as needed.   . Sulfacetamide Sodium (SODIUM SULFACETAMIDE) 10 % SHAM Apply topically.   Marland Kitchen VICTOZA 18 MG/3ML SOPN INJECT 1.8 MG ONCE DAILY SUBCUTANEOUSLY   . Vitamin D, Ergocalciferol, (DRISDOL) 50000 units CAPS capsule  01/23/2016: Received from: [place]  . [DISCONTINUED] losartan (COZAAR) 25 MG tablet Take 25 mg by mouth. Take 1/2 tab    No facility-administered encounter medications on file as of 10/17/2018.     Surgical History: Past Surgical History:  Procedure Laterality Date  . ABDOMINAL HYSTERECTOMY    . BREAST BIOPSY Left 1989   neg  . CATARACT EXTRACTION W/PHACO Right 05/21/2015   Procedure: CATARACT EXTRACTION PHACO AND INTRAOCULAR LENS PLACEMENT (IOC);  Surgeon: Milus Height, MD;  Location: [facility] ORS;  Service: Ophthalmology;  Laterality: Right;  [place]: AP%: 10.0 CDE: 9.53  . COLONOSCOPY    . JOINT REPLACEMENT Bilateral    knee  . PARS PLANA VITRECTOMY Right 05/21/2015   Procedure:  PARS PLANA VITRECTOMY WITH 25 GAUGE;  Surgeon: Milus Height, MD;  Location: [facility] ORS;  Service: Ophthalmology;  Laterality: Right;  Lot # [id number] H Endo laser: Watts: 300 Pulse Duration: 200 Total Pulses:143   . shoulder surgery      Medical History: Past Medical History:  Diagnosis Date  . Arthritis   . Bronchitis   . CHF (congestive heart failure) ([place])   . Chronic kidney disease    decreased kidney function  . Diabetes mellitus without complication ([place])   . Gout   . Hypertension   . Multiple allergies   . Shortness of breath dyspnea    w/ exertion  . Sleep apnea    CPAP  . Swelling of both lower extremities   . Wheezing     Family History: Family History  Problem Relation Age of Onset  . Breast cancer Cousin   . Breast cancer Other     Social History   Socioeconomic History  . Marital status: Divorced    Spouse name: Not on file  . Number of children: Not on file  . Years of education: Not on file  . Highest education level: Not on file  Occupational History  . Not on file  Social Needs  . Financial resource strain: Not on file  . Food insecurity    Worry: Not on file    Inability: Not on file  . Transportation needs    Medical: Not on file    Non-medical: Not on file  Tobacco Use  . Smoking status: Former Smoker    Types: Cigarettes  . Smokeless tobacco: Never Used  . Tobacco comment: 4 cigarettes quit 20 years ago  Substance and Sexual Activity  . Alcohol use: No  . Drug use: Never  . Sexual activity: Not on file  Lifestyle  . Physical activity    Days per week: Not on file    Minutes per session: Not on file  . Stress: Not on file  Relationships  . Social Herbalist on phone: Not on file    Gets together: Not on file    Attends religious service: Not on file    Active member of club or organization: Not on file    Attends meetings of clubs or organizations: Not on file    Relationship status: Not on file  . Intimate partner  violence    Fear of current or ex partner: Not on file    Emotionally abused: Not on file    Physically abused: Not on file    Forced sexual activity: Not on file  Other Topics Concern  . Not on file  Social History Narrative  . Not on file      Review of Systems  Constitutional: Negative for activity change, chills, fatigue and unexpected weight change.  HENT: Negative for congestion, postnasal drip, rhinorrhea, sneezing and sore throat.        Frequent throat clearing.   Eyes: Negative for redness.  Respiratory: Negative for cough, chest tightness, shortness of breath and wheezing.   Cardiovascular: Negative for chest pain and palpitations.  Gastrointestinal: Negative for abdominal pain, constipation, diarrhea, nausea and vomiting.  Endocrine: Negative for cold intolerance, heat intolerance, polydipsia and polyuria.       Blood sugars doing well, elevated calcium levels on recent lab draw.   Musculoskeletal: Negative for arthralgias, back pain, joint swelling and neck pain.  Skin: Negative for rash.  Allergic/Immunologic: Positive for environmental allergies.  Neurological: Negative for dizziness, tremors, numbness and headaches.  Hematological: Negative for adenopathy. Does not bruise/bleed easily.  Psychiatric/Behavioral: Positive for dysphoric mood and sleep disturbance. Negative for behavioral problems (Depression) and suicidal ideas. The patient is not nervous/anxious.     Today's Vitals   10/17/18 1459  BP: (!) 152/70  Pulse: 73  Resp: 16  Temp: (!) 97.3 F (36.3 C)  SpO2: 98%  Weight: 237 lb (107.5 kg)  Height: 5\' 3"  (1.6 m)   Body mass index is 41.98 kg/m.  Physical Exam Vitals signs and nursing note reviewed.  Constitutional:      General: She is not in acute distress.    Appearance: Normal appearance. She is well-developed. She is not diaphoretic.  HENT:     Head: Normocephalic and atraumatic.     Nose: Nose normal.     Mouth/Throat:     Pharynx: No  oropharyngeal exudate.  Eyes:     Conjunctiva/sclera: Conjunctivae normal.     Pupils: Pupils are equal, round, and reactive to light.  Neck:     Musculoskeletal: Normal range of motion and neck supple.     Thyroid: No thyromegaly.     Vascular: No JVD.     Trachea: No tracheal deviation.  Cardiovascular:     Rate and Rhythm: Normal rate and regular rhythm.     Heart sounds: Murmur present. Systolic murmur present with a grade of 2/6. No friction rub. No gallop.   Pulmonary:     Effort: Pulmonary effort is normal. No respiratory distress.     Breath sounds: Normal breath sounds. No wheezing or rales.  Chest:     Chest wall: No tenderness.  Abdominal:     Palpations: Abdomen is soft.  Musculoskeletal: Normal range of motion.  Lymphadenopathy:     Cervical: No cervical adenopathy.  Skin:    General: Skin is warm and dry.  Neurological:     Mental Status: She is alert and oriented to person, place, and time.     Cranial Nerves: No cranial nerve deficit.  Psychiatric:        Mood and Affect: Mood is depressed.        Speech: Speech normal.        Behavior: Behavior normal.        Thought Content: Thought content normal.        Judgment: Judgment normal.   Assessment/Plan: 1. Type 2 diabetes mellitus with hyperglycemia, unspecified whether long term insulin use (HCC) - POCT HgB A1C  2. Hypercalcemia New referral to endocrinology made today - Ambulatory referral to Endocrinology  3. Essential hypertension, benign Continue losartan as prescribed  - losartan (COZAAR) 50 MG tablet; Take 0.5 tablets (25 mg total) by mouth daily. Take 1/2 tab  Dispense: 30 tablet;  Refill: 0  4. Flu vaccine need Flu shot administered today.  - Flu Vaccine MDCK QUAD PF  General Counseling: Blondine verbalizes understanding of the findings of todays visit and agrees with plan of treatment. I have discussed any further diagnostic evaluation that may be needed or ordered today. We also reviewed her  medications today. she has been encouraged to call the office with any questions or concerns that should arise related to todays visit.  Diabetes Counseling:  1. Addition of ACE inh/ ARB'S for nephroprotection. Microalbumin is updated  2. Diabetic foot care, prevention of complications. Podiatry consult 3. Exercise and lose weight.  4. Diabetic eye examination, Diabetic eye exam is updated  5. Monitor blood sugar closlely. nutrition counseling.  6. Sign and symptoms of hypoglycemia including shaking sweating,confusion and headaches.  This patient was seen by Leretha Pol FNP Collaboration with Dr Lavera Guise as a part of collaborative care agreement  Orders Placed This Encounter  Procedures  . Flu Vaccine MDCK QUAD PF  . Ambulatory referral to Endocrinology  . POCT HgB A1C    Meds ordered this encounter  Medications  . losartan (COZAAR) 50 MG tablet    Sig: Take 0.5 tablets (25 mg total) by mouth daily. Take 1/2 tab    Dispense:  30 tablet    Refill:  0    Prescribed per Dr. Holley Raring.    Order Specific Question:   Supervising Provider    Answer:   Lavera Guise [[id number]]    Time spent: 36 Minutes      Dr Lavera Guise Internal medicine

## 2018-10-19 ENCOUNTER — Ambulatory Visit: Payer: Medicare Other | Admitting: Podiatry

## 2018-10-19 ENCOUNTER — Encounter: Payer: Self-pay | Admitting: Podiatry

## 2018-10-19 ENCOUNTER — Other Ambulatory Visit: Payer: Self-pay

## 2018-10-19 DIAGNOSIS — M79674 Pain in right toe(s): Secondary | ICD-10-CM

## 2018-10-19 DIAGNOSIS — E1159 Type 2 diabetes mellitus with other circulatory complications: Secondary | ICD-10-CM

## 2018-10-19 DIAGNOSIS — M79675 Pain in left toe(s): Secondary | ICD-10-CM | POA: Diagnosis not present

## 2018-10-19 DIAGNOSIS — B351 Tinea unguium: Secondary | ICD-10-CM | POA: Diagnosis not present

## 2018-10-19 NOTE — Progress Notes (Signed)
Complaint:  Visit Type: Patient returns to my office for continued preventative foot care services. Complaint: Patient states" my nails have grown long and thick and become painful to walk and wear shoes" Patient has been diagnosed with DM with vascular disease.. The patient presents for preventative foot care services.  Podiatric Exam: Vascular: dorsalis pedis and posterior tibial pulses are not  palpable bilateral. Capillary return is immediate. Temperature gradient is WNL. Skin turgor WNL  Sensorium: Normal Semmes Weinstein monofilament test. Normal tactile sensation bilaterally. Nail Exam: Pt has thick disfigured discolored nails with subungual debris noted bilateral entire nail hallux through fifth toenails Ulcer Exam: There is no evidence of ulcer or pre-ulcerative changes or infection. Orthopedic Exam: Muscle tone and strength are WNL. No limitations in general ROM. No crepitus or effusions noted. Foot type and digits show no abnormalities. Bony prominences are unremarkable. Skin: No Porokeratosis. No infection or ulcers  Diagnosis:  Onychomycosis, , Pain in right toe, pain in left toes  Treatment & Plan Procedures and Treatment: Consent by patient was obtained for treatment procedures.   Debridement of mycotic and hypertrophic toenails, 1 through 5 bilateral and clearing of subungual debris. No ulceration, no infection noted.  Return Visit-Office Procedure: Patient instructed to return to the office for a follow up visit 3 months for continued evaluation and treatment.    Gardiner Barefoot DPM

## 2018-10-28 DIAGNOSIS — Z23 Encounter for immunization: Secondary | ICD-10-CM | POA: Insufficient documentation

## 2018-10-28 DIAGNOSIS — E1165 Type 2 diabetes mellitus with hyperglycemia: Secondary | ICD-10-CM | POA: Insufficient documentation

## 2018-10-30 ENCOUNTER — Ambulatory Visit (INDEPENDENT_AMBULATORY_CARE_PROVIDER_SITE_OTHER): Payer: BC Managed Care – PPO | Admitting: Nurse Practitioner

## 2018-10-30 ENCOUNTER — Encounter: Payer: Self-pay | Admitting: Nurse Practitioner

## 2018-10-30 ENCOUNTER — Other Ambulatory Visit: Payer: Self-pay

## 2018-10-30 VITALS — BP 170/66 | HR 71 | Temp 96.4°F | Resp 16 | Ht 63.0 in | Wt 239.0 lb

## 2018-10-30 DIAGNOSIS — I10 Essential (primary) hypertension: Secondary | ICD-10-CM

## 2018-10-30 DIAGNOSIS — E782 Mixed hyperlipidemia: Secondary | ICD-10-CM

## 2018-10-30 DIAGNOSIS — M5441 Lumbago with sciatica, right side: Secondary | ICD-10-CM | POA: Diagnosis not present

## 2018-10-30 DIAGNOSIS — E1165 Type 2 diabetes mellitus with hyperglycemia: Secondary | ICD-10-CM | POA: Diagnosis not present

## 2018-10-30 DIAGNOSIS — S42291S Other displaced fracture of upper end of right humerus, sequela: Secondary | ICD-10-CM

## 2018-10-30 LAB — POCT GLYCOSYLATED HEMOGLOBIN (HGB A1C): Hemoglobin A1C: 6.6 % — AB (ref 4.0–5.6)

## 2018-10-30 MED ORDER — PEN NEEDLES 32G X 4 MM MISC: 1.0000 [IU] | Freq: Every day | 5 refills | Status: DC

## 2018-10-30 MED ORDER — ATORVASTATIN CALCIUM 20 MG PO TABS: 20.0000 mg | ORAL_TABLET | Freq: Every day | ORAL | 1 refills | Status: DC

## 2018-10-30 MED ORDER — TIZANIDINE HCL 2 MG PO TABS: 2.0000 mg | ORAL_TABLET | Freq: Three times a day (TID) | ORAL | 0 refills | Status: DC | PRN

## 2018-10-30 MED ORDER — METHYLPREDNISOLONE 4 MG PO TBPK: ORAL_TABLET | ORAL | 0 refills | Status: DC

## 2018-10-30 NOTE — Progress Notes (Signed)
[facility] [place], [place] [address]  Internal MEDICINE  Office Visit Note  Patient Name: Frances Greene  [id number]  [id number]  Date of Service: 11/12/2018   Pt is here for a sick visit.  Chief Complaint  Patient presents with  . Back Pain    lower back pain radiating down leg  . Hip Pain    throbbing and spasms in hip      The patient is here for acute visit. She is complaining of severe low back pain with throbbing pain In the right hip. Hurts to sit. She is having a difficult time finding a comfortable position to sit in. Hurts more severely when she tries to stand from a seated position. She does not recall any injury or trauma. Has done no lifting, pushing, pulling. Or straining with the arms or back. She is also having persistent pain in the left upper arm. She states that she fell, fracturing her left humerus. She was seen by orthopedic provider. She was not happy with the care she received, so she did not return. She continues to have left upper arm pain with decreased ROM and strength in the left arm and shoulder.        Current Medication:  Outpatient Encounter Medications as of 10/30/2018  Medication Sig Note  . allopurinol (ZYLOPRIM) 300 MG tablet TAKE 1 TABLET BY MOUTH AT NIGHT FOR GOUT   . aspirin EC 325 MG tablet Take 325 mg by mouth. 01/23/2016: Received from: [place]: Take 325 mg by mouth.  Marland Kitchen atorvastatin (LIPITOR) 20 MG tablet Take 1 tablet (20 mg total) by mouth daily.   . bisoprolol-hydrochlorothiazide (ZIAC) 2.5-6.25 MG tablet Take 1 tablet by mouth 2 (two) times daily.   . Cholecalciferol (D3-1000) 1000 units tablet Take 1,000 Units by mouth daily.   . citalopram (CELEXA) 20 MG tablet Take 20 mg by mouth daily.   . diphenhydrAMINE (ALLERGY) 25 MG tablet Take 25 mg by mouth daily.   . Ferrous Sulfate 134 MG TABS Take 1 tablet by mouth daily.   . folic acid (FOLVITE) 948 MCG tablet Take 400 mcg by mouth  daily.   . furosemide (LASIX) 40 MG tablet TAKE 1 TABLET BY MOUTH ONCE DAILY AND  TAKE  XTRA  TAB  IF  NEEDED   . Garlic 5462 MG CAPS Take by mouth. 01/23/2016: Received from: [place]: Take by mouth.  Marland Kitchen glimepiride (AMARYL) 1 MG tablet Take 1 tablet (1 mg total) by mouth daily.   . hydrALAZINE (APRESOLINE) 25 MG tablet TAKE 1 TABLET BY MOUTH THREE TIMES DAILY FOR BLOOD PRESSURE   . HYDROcodone-acetaminophen (NORCO/VICODIN) 5-325 MG tablet Take 1 tablet by mouth every 6 (six) hours as needed for moderate pain or severe pain.   Marland Kitchen ketoconazole (NIZORAL) 2 % shampoo SHAMPOO AS DIRECTED   . linaclotide (LINZESS) 145 MCG CAPS capsule Take 1 capsule (145 mcg total) by mouth daily before breakfast.   . losartan (COZAAR) 50 MG tablet Take 0.5 tablets (25 mg total) by mouth daily. Take 1/2 tab   . meloxicam (MOBIC) 7.5 MG tablet TAKE 1 TABLET BY MOUTH ONCE DAILY FOR  GOUT  AND  ARTHRITIS   . montelukast (SINGULAIR) 10 MG tablet Take 1 tablet (10 mg total) by mouth daily.   . Omega-3 Fatty Acids (FISH OIL PO) Take by mouth. 01/23/2016: Received from: [place]: Take 1 capsule by mouth 2 (  two) times daily.  Glory Rosebush ULTRA test strip USE TO CHECK BLOOD GLUCOSE TWICE DAILY   . polyethylene glycol (MIRALAX / GLYCOLAX) packet Take 17 g by mouth daily as needed.   . Sulfacetamide Sodium (SODIUM SULFACETAMIDE) 10 % SHAM Apply topically.   Marland Kitchen VICTOZA 18 MG/3ML SOPN INJECT 1.8 MG ONCE DAILY SUBCUTANEOUSLY   . Vitamin D, Ergocalciferol, (DRISDOL) 50000 units CAPS capsule  01/23/2016: Received from: [facility]  . [DISCONTINUED] atorvastatin (LIPITOR) 20 MG tablet Take 1 tablet by mouth once daily   . Insulin Pen Needle (PEN NEEDLES) 32G X 4 MM MISC 1 Units by Does not apply route daily. Use with victoza injections daily.   . methylPREDNISolone (MEDROL) 4 MG TBPK tablet Take by mouth as directed for 6 days (Patient not taking: Reported on  11/07/2018)   . tiZANidine (ZANAFLEX) 2 MG tablet Take 1 tablet (2 mg total) by mouth every 8 (eight) hours as needed for muscle spasms.    No facility-administered encounter medications on file as of 10/30/2018.       Medical History: Past Medical History:  Diagnosis Date  . Arthritis   . Bronchitis   . CHF (congestive heart failure) ([place])   . Chronic kidney disease    decreased kidney function  . Diabetes mellitus without complication ([place])   . Gout   . Hypertension   . Multiple allergies   . Shortness of breath dyspnea    w/ exertion  . Sleep apnea    CPAP  . Swelling of both lower extremities   . Wheezing      Today's Vitals   10/30/18 1209  BP: (!) 170/66  Pulse: 71  Resp: 16  Temp: (!) 96.4 F (35.8 C)  SpO2: 95%  Weight: 239 lb (108.4 kg)  Height: 5\' 3"  (1.6 m)   Body mass index is 42.34 kg/m.  Review of Systems  Constitutional: Positive for activity change. Negative for chills, fatigue and unexpected weight change.  HENT: Negative for congestion, postnasal drip, rhinorrhea, sneezing and sore throat.   Respiratory: Negative for cough, chest tightness, shortness of breath and wheezing.   Cardiovascular: Negative for chest pain and palpitations.  Gastrointestinal: Negative for abdominal pain, constipation, diarrhea, nausea and vomiting.  Musculoskeletal: Positive for arthralgias, back pain and myalgias. Negative for joint swelling and neck pain.       Low back pain, radiating into the hips.   Skin: Negative for rash.  Neurological: Negative for dizziness, tremors, numbness and headaches.  Hematological: Negative for adenopathy. Does not bruise/bleed easily.  Psychiatric/Behavioral: Negative for behavioral problems (Depression), sleep disturbance and suicidal ideas. The patient is not nervous/anxious.     Physical Exam Vitals signs and nursing note reviewed.  Constitutional:      General: She is not in acute distress.    Appearance: Normal appearance.  She is well-developed. She is not diaphoretic.  HENT:     Head: Normocephalic and atraumatic.     Nose: Nose normal.     Mouth/Throat:     Pharynx: No oropharyngeal exudate.  Eyes:     Conjunctiva/sclera: Conjunctivae normal.     Pupils: Pupils are equal, round, and reactive to light.  Neck:     Musculoskeletal: Normal range of motion and neck supple.     Thyroid: No thyromegaly.     Vascular: No JVD.     Trachea: No tracheal deviation.  Cardiovascular:     Rate and Rhythm: Normal rate and regular rhythm.  Heart sounds: Murmur present. Systolic murmur present with a grade of 2/6. No friction rub. No gallop.   Pulmonary:     Effort: Pulmonary effort is normal. No respiratory distress.     Breath sounds: Normal breath sounds. No wheezing or rales.  Chest:     Chest wall: No tenderness.  Abdominal:     Palpations: Abdomen is soft.  Musculoskeletal: Normal range of motion.     Comments: Severe lower back pain which is radiating into the hips. Pushing, pulling, and reaching with the arms makes pain worse. Sitting for long periods of time increase pain as well. Bending and twisting at the waist also increase pain. She also continues to have severe pain in the left upper arm which is causing reduced ROM and strength in the left arm.   Lymphadenopathy:     Cervical: No cervical adenopathy.  Skin:    General: Skin is warm and dry.  Neurological:     Mental Status: She is alert and oriented to person, place, and time.     Cranial Nerves: No cranial nerve deficit.  Psychiatric:        Mood and Affect: Mood is depressed.        Speech: Speech normal.        Behavior: Behavior normal.        Thought Content: Thought content normal.        Judgment: Judgment normal.   Assessment/Plan: 1. Acute bilateral low back pain with right-sided sciatica Add medrol dose pack. Take as directed for 6 days. May also take tizanidine 2mg  up to threee times daily if needed for tight and sore muscles.  Recommend she closel watch her blood sugars while taking medrol as it may increse blood sugars significantly. She voiced understanding.  - methylPREDNISolone (MEDROL) 4 MG TBPK tablet; Take by mouth as directed for 6 days (Patient not taking: Reported on 11/07/2018)  Dispense: 21 tablet; Refill: 0 - tiZANidine (ZANAFLEX) 2 MG tablet; Take 1 tablet (2 mg total) by mouth every 8 (eight) hours as needed for muscle spasms.  Dispense: 30 tablet; Refill: 0  2. Other closed displaced fracture of proximal end of right humerus, sequela Refer to new orthopedic provider for further evaluation and treatment. - Ambulatory referral to Orthopedic Surgery  3. Mixed hyperlipidemia - atorvastatin (LIPITOR) 20 MG tablet; Take 1 tablet (20 mg total) by mouth daily.  Dispense: 90 tablet; Refill: 1  4. Type 2 diabetes mellitus with hyperglycemia, unspecified whether long term insulin use (HCC) - Insulin Pen Needle (PEN NEEDLES) 32G X 4 MM MISC; 1 Units by Does not apply route daily. Use with victoza injections daily.  Dispense: 100 each; Refill: 5  5. Essential hypertension, benign Stable. continue bp medication as prescribed   General Counseling: mazy culton understanding of the findings of todays visit and agrees with plan of treatment. I have discussed any further diagnostic evaluation that may be needed or ordered today. We also reviewed her medications today. she has been encouraged to call the office with any questions or concerns that should arise related to todays visit.    Counseling:  Diabetes Counseling:  1. Addition of ACE inh/ ARB'S for nephroprotection. Microalbumin is updated  2. Diabetic foot care, prevention of complications. Podiatry consult 3. Exercise and lose weight.  4. Diabetic eye examination, Diabetic eye exam is updated  5. Monitor blood sugar closlely. nutrition counseling.  6. Sign and symptoms of hypoglycemia including shaking sweating,confusion and headaches.   This  patient  was seen by Leretha Pol FNP Collaboration with Dr Lavera Guise as a part of collaborative care agreement  Orders Placed This Encounter  Procedures  . Ambulatory referral to Orthopedic Surgery    Meds ordered this encounter  Medications  . methylPREDNISolone (MEDROL) 4 MG TBPK tablet    Sig: Take by mouth as directed for 6 days    Dispense:  21 tablet    Refill:  0    Order Specific Question:   Supervising Provider    Answer:   Lavera Guise [place]  . tiZANidine (ZANAFLEX) 2 MG tablet    Sig: Take 1 tablet (2 mg total) by mouth every 8 (eight) hours as needed for muscle spasms.    Dispense:  30 tablet    Refill:  0    Order Specific Question:   Supervising Provider    Answer:   Lavera Guise [[id number]]  . Insulin Pen Needle (PEN NEEDLES) 32G X 4 MM MISC    Sig: 1 Units by Does not apply route daily. Use with victoza injections daily.    Dispense:  100 each    Refill:  5    Please use generic alternative preferred by her insurance. Thanksl    Order Specific Question:   Supervising Provider    Answer:   Lavera Guise [[id number]]  . atorvastatin (LIPITOR) 20 MG tablet    Sig: Take 1 tablet (20 mg total) by mouth daily.    Dispense:  90 tablet    Refill:  1    Order Specific Question:   Supervising Provider    Answer:   Lavera Guise [[id number]]    Time spent: 25 Minutes

## 2018-11-07 ENCOUNTER — Other Ambulatory Visit: Payer: Self-pay

## 2018-11-07 ENCOUNTER — Encounter: Payer: Self-pay | Admitting: Adult Health

## 2018-11-07 ENCOUNTER — Ambulatory Visit (INDEPENDENT_AMBULATORY_CARE_PROVIDER_SITE_OTHER): Payer: BC Managed Care – PPO | Admitting: Adult Health

## 2018-11-07 VITALS — BP 140/70 | HR 69 | Temp 97.5°F | Resp 16 | Ht 63.0 in | Wt 233.0 lb

## 2018-11-07 DIAGNOSIS — E1165 Type 2 diabetes mellitus with hyperglycemia: Secondary | ICD-10-CM | POA: Diagnosis not present

## 2018-11-07 DIAGNOSIS — I10 Essential (primary) hypertension: Secondary | ICD-10-CM | POA: Diagnosis not present

## 2018-11-07 DIAGNOSIS — M5441 Lumbago with sciatica, right side: Secondary | ICD-10-CM

## 2018-11-07 NOTE — Progress Notes (Signed)
[facility] [place], [place] [address]  Internal MEDICINE  Office Visit Note  Patient Name: Frances Greene  [id number]  [id number]  Date of Service: 11/07/2018  Chief Complaint  Patient presents with  . Hip Pain  . Medical Management of Chronic Issues    Bp check  . Diabetes  . Hypertension    HPI  Pt is here for one week follow up on left hip/back pain.  She was given prednisone taper, and muscle relaxers and has taken them faithfully.  She reports feeling much better at this time.  She continues to have some pain at night while in bed, however it is much improved.    Current Medication: Outpatient Encounter Medications as of 11/07/2018  Medication Sig Note  . allopurinol (ZYLOPRIM) 300 MG tablet TAKE 1 TABLET BY MOUTH AT NIGHT FOR GOUT   . aspirin EC 325 MG tablet Take 325 mg by mouth. 01/23/2016: Received from: [place]: Take 325 mg by mouth.  Marland Kitchen atorvastatin (LIPITOR) 20 MG tablet Take 1 tablet (20 mg total) by mouth daily.   . bisoprolol-hydrochlorothiazide (ZIAC) 2.5-6.25 MG tablet Take 1 tablet by mouth 2 (two) times daily.   . Cholecalciferol (D3-1000) 1000 units tablet Take 1,000 Units by mouth daily.   . citalopram (CELEXA) 20 MG tablet Take 20 mg by mouth daily.   . diphenhydrAMINE (ALLERGY) 25 MG tablet Take 25 mg by mouth daily.   . Ferrous Sulfate 134 MG TABS Take 1 tablet by mouth daily.   . folic acid (FOLVITE) 620 MCG tablet Take 400 mcg by mouth daily.   . furosemide (LASIX) 40 MG tablet TAKE 1 TABLET BY MOUTH ONCE DAILY AND  TAKE  XTRA  TAB  IF  NEEDED   . Garlic 3559 MG CAPS Take by mouth. 01/23/2016: Received from: [place]: Take by mouth.  Marland Kitchen glimepiride (AMARYL) 1 MG tablet Take 1 tablet (1 mg total) by mouth daily.   . hydrALAZINE (APRESOLINE) 25 MG tablet TAKE 1 TABLET BY MOUTH THREE TIMES DAILY FOR BLOOD PRESSURE   . HYDROcodone-acetaminophen (NORCO/VICODIN) 5-325 MG tablet  Take 1 tablet by mouth every 6 (six) hours as needed for moderate pain or severe pain.   . Insulin Pen Needle (PEN NEEDLES) 32G X 4 MM MISC 1 Units by Does not apply route daily. Use with victoza injections daily.   Marland Kitchen ketoconazole (NIZORAL) 2 % shampoo SHAMPOO AS DIRECTED   . linaclotide (LINZESS) 145 MCG CAPS capsule Take 1 capsule (145 mcg total) by mouth daily before breakfast.   . losartan (COZAAR) 50 MG tablet Take 0.5 tablets (25 mg total) by mouth daily. Take 1/2 tab   . meloxicam (MOBIC) 7.5 MG tablet TAKE 1 TABLET BY MOUTH ONCE DAILY FOR  GOUT  AND  ARTHRITIS   . methylPREDNISolone (MEDROL) 4 MG TBPK tablet Take by mouth as directed for 6 days (Patient not taking: Reported on 11/07/2018)   . montelukast (SINGULAIR) 10 MG tablet Take 1 tablet (10 mg total) by mouth daily.   . Omega-3 Fatty Acids (FISH OIL PO) Take by mouth. 01/23/2016: Received from: [place]: Take 1 capsule by mouth 2 (two) times daily.  Glory Rosebush ULTRA test strip USE TO CHECK BLOOD GLUCOSE TWICE DAILY   . polyethylene glycol (MIRALAX / GLYCOLAX) packet Take 17 g by mouth daily as needed.   . Sulfacetamide Sodium (SODIUM SULFACETAMIDE) 10 % SHAM Apply topically.   Marland Kitchen  tiZANidine (ZANAFLEX) 2 MG tablet Take 1 tablet (2 mg total) by mouth every 8 (eight) hours as needed for muscle spasms.   Marland Kitchen VICTOZA 18 MG/3ML SOPN INJECT 1.8 MG ONCE DAILY SUBCUTANEOUSLY   . Vitamin D, Ergocalciferol, (DRISDOL) 50000 units CAPS capsule  01/23/2016: Received from: [place]   No facility-administered encounter medications on file as of 11/07/2018.     Surgical History: Past Surgical History:  Procedure Laterality Date  . ABDOMINAL HYSTERECTOMY    . BREAST BIOPSY Left 1989   neg  . CATARACT EXTRACTION W/PHACO Right 05/21/2015   Procedure: CATARACT EXTRACTION PHACO AND INTRAOCULAR LENS PLACEMENT (IOC);  Surgeon: Milus Height, MD;  Location: [facility] ORS;  Service: Ophthalmology;   Laterality: Right;  [place]: AP%: 10.0 CDE: 9.53  . COLONOSCOPY    . JOINT REPLACEMENT Bilateral    knee  . PARS PLANA VITRECTOMY Right 05/21/2015   Procedure: PARS PLANA VITRECTOMY WITH 25 GAUGE;  Surgeon: Milus Height, MD;  Location: [facility] ORS;  Service: Ophthalmology;  Laterality: Right;  Lot # [id number] H Endo laser: [place]: 300 Pulse Duration: 200 Total Pulses:143   . shoulder surgery      Medical History: Past Medical History:  Diagnosis Date  . Arthritis   . Bronchitis   . CHF (congestive heart failure) ([place])   . Chronic kidney disease    decreased kidney function  . Diabetes mellitus without complication ([place])   . Gout   . Hypertension   . Multiple allergies   . Shortness of breath dyspnea    w/ exertion  . Sleep apnea    CPAP  . Swelling of both lower extremities   . Wheezing     Family History: Family History  Problem Relation Age of Onset  . Breast cancer Cousin   . Breast cancer Other     Social History   Socioeconomic History  . Marital status: Divorced    Spouse name: Not on file  . Number of children: Not on file  . Years of education: Not on file  . Highest education level: Not on file  Occupational History  . Not on file  Social Needs  . Financial resource strain: Not on file  . Food insecurity    Worry: Not on file    Inability: Not on file  . Transportation needs    Medical: Not on file    Non-medical: Not on file  Tobacco Use  . Smoking status: Former Smoker    Types: Cigarettes  . Smokeless tobacco: Never Used  . Tobacco comment: 4 cigarettes quit 20 years ago  Substance and Sexual Activity  . Alcohol use: No  . Drug use: Never  . Sexual activity: Not on file  Lifestyle  . Physical activity    Days per week: Not on file    Minutes per session: Not on file  . Stress: Not on file  Relationships  . Social Herbalist on phone: Not on file    Gets together: Not on file    Attends religious service: Not on file    Active  member of club or organization: Not on file    Attends meetings of clubs or organizations: Not on file    Relationship status: Not on file  . Intimate partner violence    Fear of current or ex partner: Not on file    Emotionally abused: Not on file    Physically abused: Not on file    Forced sexual activity:  Not on file  Other Topics Concern  . Not on file  Social History Narrative  . Not on file      Review of Systems  Constitutional: Negative for chills, fatigue and unexpected weight change.  HENT: Negative for congestion, rhinorrhea, sneezing and sore throat.   Eyes: Negative for photophobia, pain and redness.  Respiratory: Negative for cough, chest tightness and shortness of breath.   Cardiovascular: Negative for chest pain and palpitations.  Gastrointestinal: Negative for abdominal pain, constipation, diarrhea, nausea and vomiting.  Endocrine: Negative.   Genitourinary: Negative for dysuria and frequency.  Musculoskeletal: Negative for arthralgias, back pain, joint swelling and neck pain.  Skin: Negative for rash.  Allergic/Immunologic: Negative.   Neurological: Negative for tremors and numbness.  Hematological: Negative for adenopathy. Does not bruise/bleed easily.  Psychiatric/Behavioral: Negative for behavioral problems and sleep disturbance. The patient is not nervous/anxious.     Vital Signs: BP 140/70   Pulse 69   Temp (!) 97.5 F (36.4 C)   Resp 16   Ht 5\' 3"  (1.6 m)   Wt 233 lb (105.7 kg)   SpO2 96%   BMI 41.27 kg/m    Physical Exam Vitals signs and nursing note reviewed.  Constitutional:      General: She is not in acute distress.    Appearance: She is well-developed. She is not diaphoretic.  HENT:     Head: Normocephalic and atraumatic.     Mouth/Throat:     Pharynx: No oropharyngeal exudate.  Eyes:     Pupils: Pupils are equal, round, and reactive to light.  Neck:     Musculoskeletal: Normal range of motion and neck supple.     Thyroid: No  thyromegaly.     Vascular: No JVD.     Trachea: No tracheal deviation.  Cardiovascular:     Rate and Rhythm: Normal rate and regular rhythm.     Heart sounds: Normal heart sounds. No murmur. No friction rub. No gallop.   Pulmonary:     Effort: Pulmonary effort is normal. No respiratory distress.     Breath sounds: Normal breath sounds. No wheezing or rales.  Chest:     Chest wall: No tenderness.  Abdominal:     Palpations: Abdomen is soft.     Tenderness: There is no abdominal tenderness. There is no guarding.  Musculoskeletal: Normal range of motion.  Lymphadenopathy:     Cervical: No cervical adenopathy.  Skin:    General: Skin is warm and dry.  Neurological:     Mental Status: She is alert and oriented to person, place, and time.     Cranial Nerves: No cranial nerve deficit.  Psychiatric:        Behavior: Behavior normal.        Thought Content: Thought content normal.        Judgment: Judgment normal.    Assessment/Plan: 1. Acute left-sided low back pain with right-sided sciatica Improved, continue to use muscle relaxer as needed, and take tylenol for break through pain.   2. Essential hypertension Improved, continue present management.  3. Type 2 diabetes mellitus with hyperglycemia, unspecified whether long term insulin use ([place]) Continue present management.   General Counseling: aura bibby understanding of the findings of todays visit and agrees with plan of treatment. I have discussed any further diagnostic evaluation that may be needed or ordered today. We also reviewed her medications today. she has been encouraged to call the office with any questions or concerns that should arise  related to todays visit.    No orders of the defined types were placed in this encounter.   No orders of the defined types were placed in this encounter.   Time spent: 15 Minutes   This patient was seen by Orson Gear AGNP-C in Collaboration with Dr Lavera Guise as a  part of collaborative care agreement     Kendell Bane AGNP-C Internal medicine

## 2018-11-12 DIAGNOSIS — S42201A Unspecified fracture of upper end of right humerus, initial encounter for closed fracture: Secondary | ICD-10-CM | POA: Insufficient documentation

## 2018-11-12 DIAGNOSIS — M5441 Lumbago with sciatica, right side: Secondary | ICD-10-CM | POA: Insufficient documentation

## 2018-11-18 IMAGING — MG MM DIGITAL SCREENING BILAT W/ TOMO W/ CAD
6 of 10 series · 6 of 30 positions shown · non-contrast
Comparison: Previous exam(s).

CLINICAL DATA: Screening.

EXAM:
DIGITAL SCREENING BILATERAL MAMMOGRAM WITH TOMO AND CAD

[L MLO synth-2D]
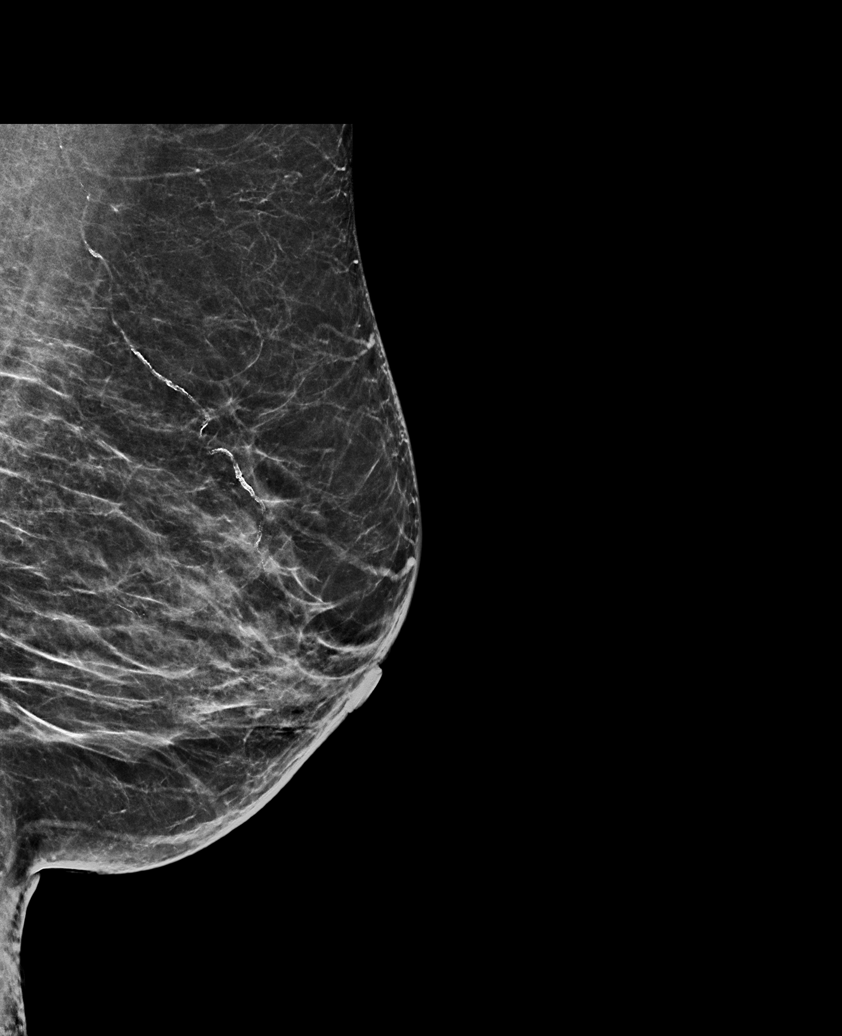

[R MLO synth-2D (1 of 2)]
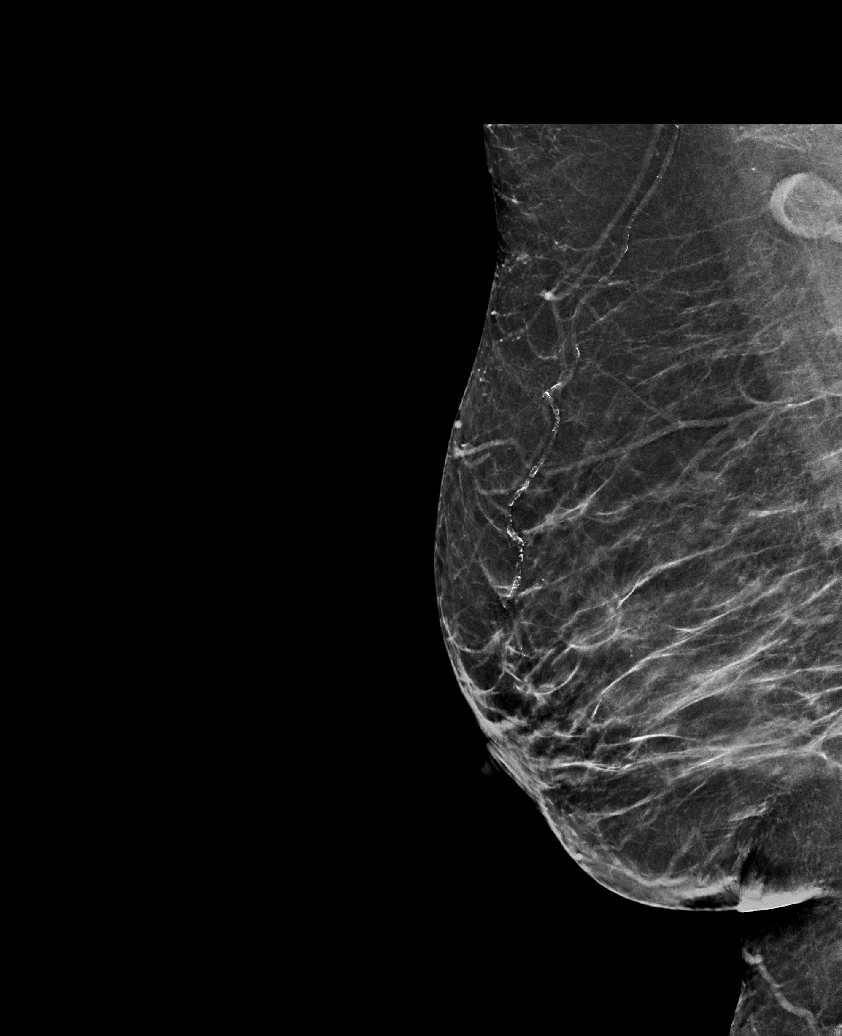

[R CC synth-2D]
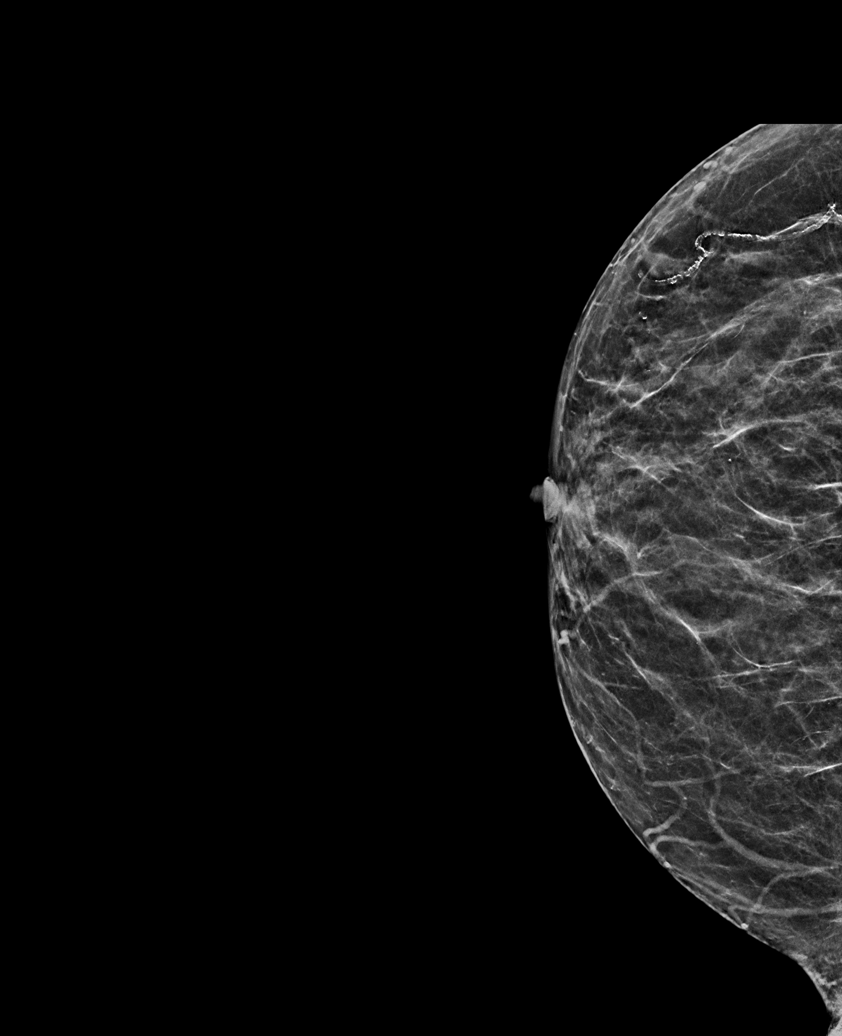

[R MLO synth-2D (2 of 2)]
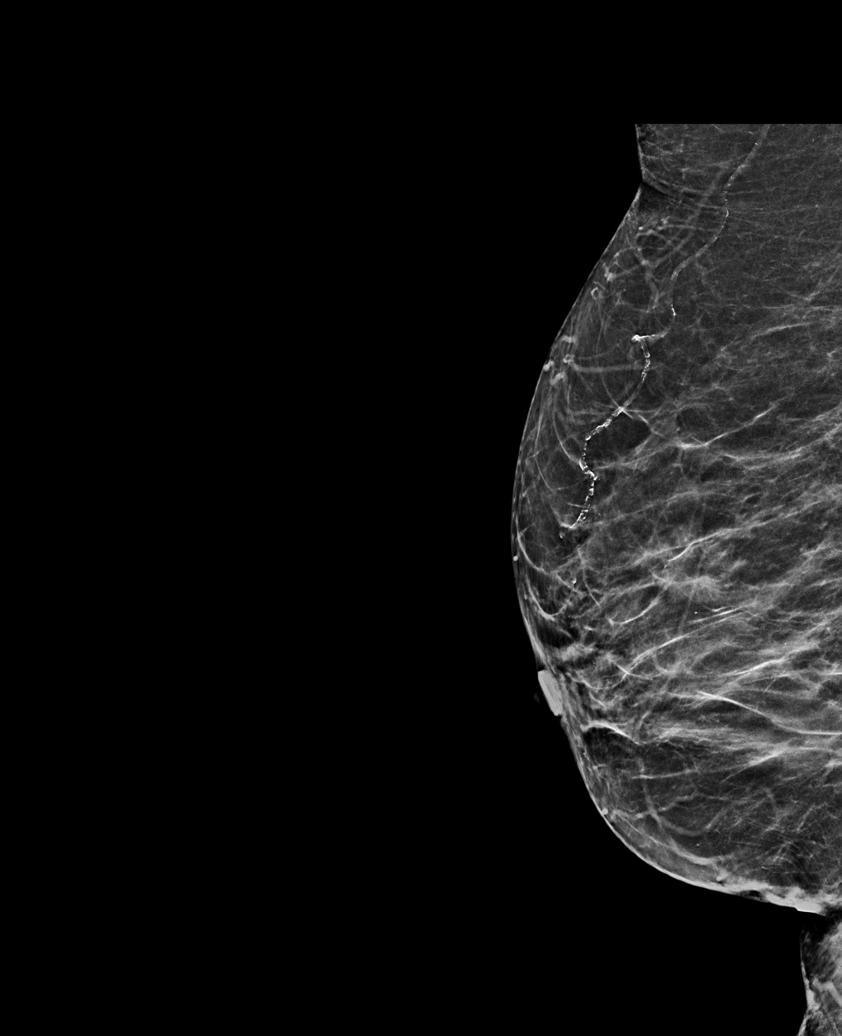

[L CC synth-2D]
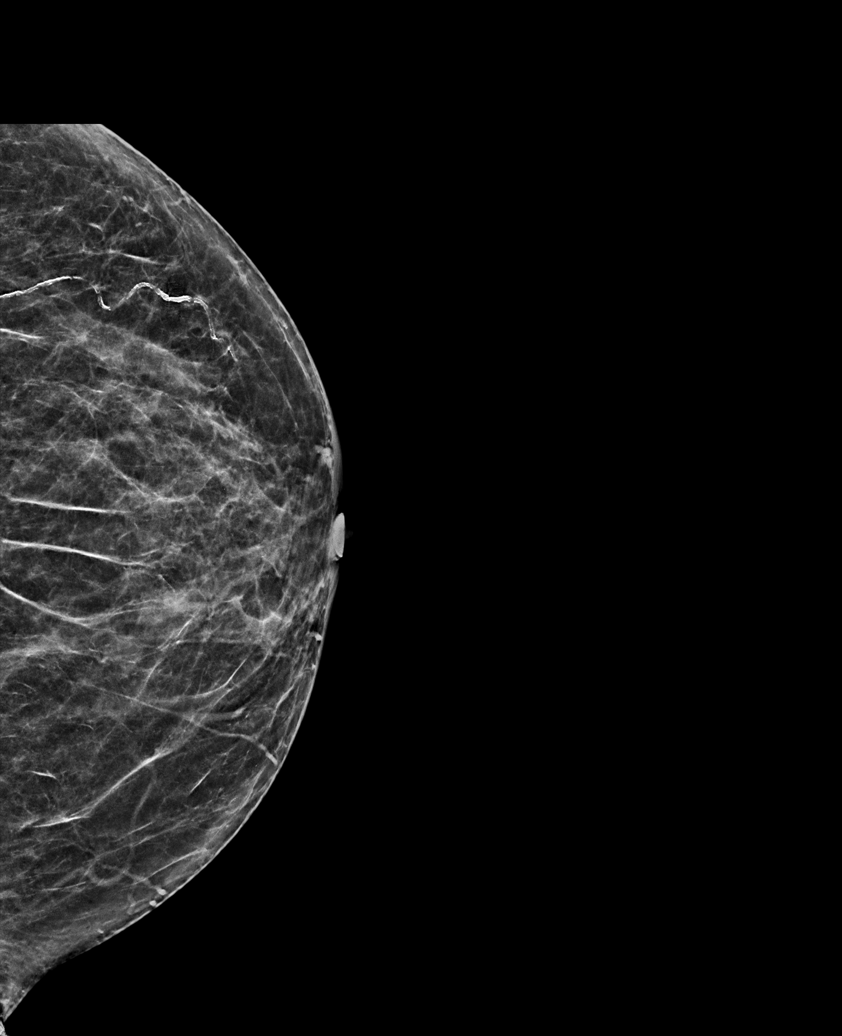

[R MLO tomo · tomo slice 34/67.0]
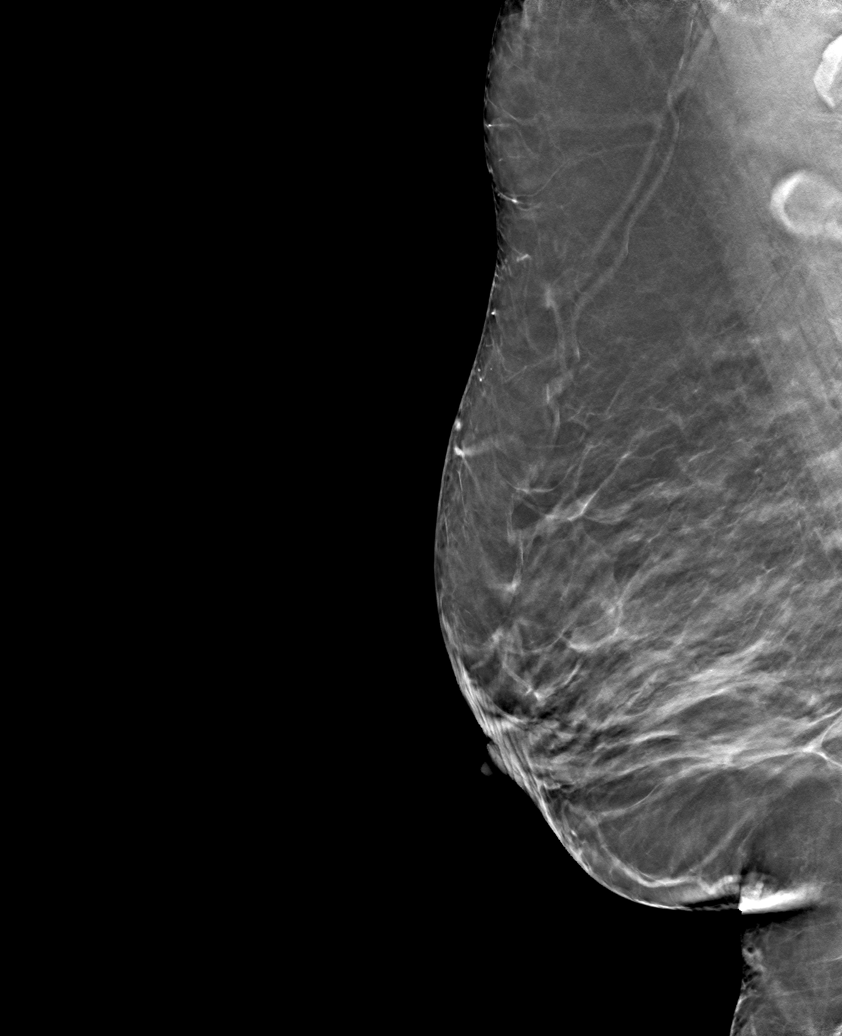

[6 of 30 positions shown; findings below may reference images not displayed]

ACR Breast Density Category c: The breast tissue is heterogeneously
dense, which may obscure small masses.
FINDINGS: There are no findings suspicious for malignancy. Images were
processed with CAD.
IMPRESSION: No mammographic evidence of malignancy. A result letter of this
screening mammogram will be mailed directly to the patient.

RECOMMENDATION:
Screening mammogram in one year. (Code:FT-U-LHB)

BI-RADS CATEGORY  1: Negative.

## 2018-12-07 ENCOUNTER — Other Ambulatory Visit: Payer: Self-pay | Admitting: Internal Medicine

## 2018-12-07 MED ORDER — BISOPROLOL-HYDROCHLOROTHIAZIDE 2.5-6.25 MG PO TABS: 1.0000 | ORAL_TABLET | Freq: Two times a day (BID) | ORAL | 1 refills | Status: DC

## 2018-12-12 ENCOUNTER — Other Ambulatory Visit: Payer: Self-pay

## 2018-12-12 ENCOUNTER — Other Ambulatory Visit: Payer: Self-pay | Admitting: Internal Medicine

## 2018-12-12 MED ORDER — ONETOUCH VERIO VI STRP: ORAL_STRIP | 3 refills | Status: DC

## 2018-12-12 MED ORDER — GLIMEPIRIDE 1 MG PO TABS: 1.0000 mg | ORAL_TABLET | Freq: Every day | ORAL | 1 refills | Status: DC

## 2018-12-12 MED ORDER — ONETOUCH DELICA LANCETS 30G MISC: 1.0000 | Freq: Two times a day (BID) | 3 refills | Status: DC

## 2018-12-21 ENCOUNTER — Other Ambulatory Visit: Payer: Self-pay

## 2018-12-21 MED ORDER — VICTOZA 18 MG/3ML ~~LOC~~ SOPN: PEN_INJECTOR | SUBCUTANEOUS | 5 refills | Status: DC

## 2018-12-26 ENCOUNTER — Other Ambulatory Visit: Payer: Self-pay

## 2018-12-26 MED ORDER — VICTOZA 18 MG/3ML ~~LOC~~ SOPN: PEN_INJECTOR | SUBCUTANEOUS | 5 refills | Status: DC

## 2019-01-24 ENCOUNTER — Other Ambulatory Visit: Payer: Self-pay

## 2019-01-24 MED ORDER — ALLOPURINOL 300 MG PO TABS: 300.0000 mg | ORAL_TABLET | Freq: Every day | ORAL | 0 refills | Status: DC

## 2019-01-25 ENCOUNTER — Ambulatory Visit: Payer: Medicare HMO | Admitting: Podiatry

## 2019-01-25 ENCOUNTER — Encounter: Payer: Self-pay | Admitting: Podiatry

## 2019-01-25 ENCOUNTER — Other Ambulatory Visit: Payer: Self-pay

## 2019-01-25 DIAGNOSIS — E1159 Type 2 diabetes mellitus with other circulatory complications: Secondary | ICD-10-CM | POA: Diagnosis not present

## 2019-01-25 DIAGNOSIS — B351 Tinea unguium: Secondary | ICD-10-CM | POA: Diagnosis not present

## 2019-01-25 DIAGNOSIS — M79674 Pain in right toe(s): Secondary | ICD-10-CM | POA: Diagnosis not present

## 2019-01-25 DIAGNOSIS — M79675 Pain in left toe(s): Secondary | ICD-10-CM | POA: Diagnosis not present

## 2019-01-25 NOTE — Progress Notes (Signed)
Complaint:  Visit Type: Patient returns to my office for continued preventative foot care services. Complaint: Patient states" my nails have grown long and thick and become painful to walk and wear shoes" Patient has been diagnosed with DM with vascular disease.. The patient presents for preventative foot care services.  Podiatric Exam: Vascular: dorsalis pedis and posterior tibial pulses are not  palpable bilateral. Capillary return is immediate. Temperature gradient is WNL. Skin turgor WNL  Sensorium: Normal Semmes Weinstein monofilament test. Normal tactile sensation bilaterally. Nail Exam: Pt has thick disfigured discolored nails with subungual debris noted bilateral entire nail hallux through fifth toenails Ulcer Exam: There is no evidence of ulcer or pre-ulcerative changes or infection. Orthopedic Exam: Muscle tone and strength are WNL. No limitations in general ROM. No crepitus or effusions noted. Foot type and digits show no abnormalities. Bony prominences are unremarkable. Skin: No Porokeratosis. No infection or ulcers  Diagnosis:  Onychomycosis, , Pain in right toe, pain in left toes  Treatment & Plan Procedures and Treatment: Consent by patient was obtained for treatment procedures.   Debridement of mycotic and hypertrophic toenails, 1 through 5 bilateral and clearing of subungual debris. No ulceration, no infection noted.  Return Visit-Office Procedure: Patient instructed to return to the office for a follow up visit 3 months for continued evaluation and treatment.    Gardiner Barefoot DPM

## 2019-01-29 ENCOUNTER — Other Ambulatory Visit: Payer: Self-pay

## 2019-01-29 MED ORDER — ONETOUCH VERIO VI STRP: ORAL_STRIP | 3 refills | Status: DC

## 2019-01-30 ENCOUNTER — Other Ambulatory Visit: Payer: Self-pay

## 2019-01-30 ENCOUNTER — Telehealth: Payer: Self-pay

## 2019-01-30 MED ORDER — ACCU-CHEK GUIDE VI STRP: 1.0000 | ORAL_STRIP | Freq: Two times a day (BID) | 3 refills | Status: DC

## 2019-01-30 MED ORDER — ACCU-CHEK GUIDE ME W/DEVICE KIT: 1.0000 | PACK | Freq: Two times a day (BID) | 0 refills | Status: DC

## 2019-01-30 MED ORDER — ACCU-CHEK FASTCLIX LANCETS MISC: 3 refills | Status: DC

## 2019-01-30 NOTE — Telephone Encounter (Signed)
As per [employer] prefers accu check send new pres for machine,strips and lancets

## 2019-02-12 ENCOUNTER — Other Ambulatory Visit: Payer: Self-pay

## 2019-02-12 MED ORDER — ALLOPURINOL 300 MG PO TABS: 300.0000 mg | ORAL_TABLET | Freq: Every day | ORAL | 0 refills | Status: DC

## 2019-02-16 ENCOUNTER — Telehealth: Payer: Self-pay

## 2019-02-16 NOTE — Telephone Encounter (Signed)
Lmom for patient to screen and confirm 02-20-19 ov.

## 2019-02-20 ENCOUNTER — Encounter: Payer: Self-pay | Admitting: Adult Health

## 2019-02-20 ENCOUNTER — Ambulatory Visit (INDEPENDENT_AMBULATORY_CARE_PROVIDER_SITE_OTHER): Payer: Medicare HMO | Admitting: Adult Health

## 2019-02-20 ENCOUNTER — Other Ambulatory Visit: Payer: Self-pay

## 2019-02-20 VITALS — BP 150/65 | Temp 97.0°F | Resp 16 | Ht 62.0 in | Wt 233.0 lb

## 2019-02-20 DIAGNOSIS — J309 Allergic rhinitis, unspecified: Secondary | ICD-10-CM

## 2019-02-20 DIAGNOSIS — E1165 Type 2 diabetes mellitus with hyperglycemia: Secondary | ICD-10-CM | POA: Diagnosis not present

## 2019-02-20 DIAGNOSIS — K5904 Chronic idiopathic constipation: Secondary | ICD-10-CM | POA: Diagnosis not present

## 2019-02-20 DIAGNOSIS — I10 Essential (primary) hypertension: Secondary | ICD-10-CM

## 2019-02-20 LAB — POCT GLYCOSYLATED HEMOGLOBIN (HGB A1C): Hemoglobin A1C: 6.1 % — AB (ref 4.0–5.6)

## 2019-02-20 MED ORDER — SENNA 8.6 MG PO TABS: 1.0000 | ORAL_TABLET | Freq: Every day | ORAL | 0 refills | Status: DC

## 2019-02-20 MED ORDER — MONTELUKAST SODIUM 10 MG PO TABS: 10.0000 mg | ORAL_TABLET | Freq: Every day | ORAL | 2 refills | Status: DC

## 2019-02-20 NOTE — Progress Notes (Signed)
[facility] [place], [place] [address]  Internal MEDICINE  Office Visit Note  Patient Name: Frances Greene  [id number]  [id number]  Date of Service: 02/20/2019  Chief Complaint  Patient presents with  . Diabetes  . Hypertension    HPI Patient is here today to follow-up on her A1C. Today her A1C is 6.1, has been stable over the last few months, continue with current treatment at this time. She complains today of constipation. She reports that she has not had a bowel movement in over 2 weeks. She was given samples of Linzess at her last visit, has tried those with no relief. She states she has always had issues with constipation but has not been this long without having a bowel movement in the past. She reports she has had a few very small bowel movements that are mostly liquid stool. She denies feeling bloating or having abdominal discomfort. She continues to have an appetite and has been eating, drinks several glasses of water daily.     Current Medication: Outpatient Encounter Medications as of 02/20/2019  Medication Sig Note  . Accu-Chek FastClix Lancets MISC Use as instructed 2 times daily diag E11.65   . allopurinol (ZYLOPRIM) 300 MG tablet Take 1 tablet (300 mg total) by mouth daily.   Marland Kitchen aspirin EC 325 MG tablet Take 325 mg by mouth. 01/23/2016: Received from: [place]: Take 325 mg by mouth.  Marland Kitchen atorvastatin (LIPITOR) 20 MG tablet Take 1 tablet (20 mg total) by mouth daily.   . bisoprolol-hydrochlorothiazide (ZIAC) 2.5-6.25 MG tablet Take 1 tablet by mouth 2 (two) times daily.   . Blood Glucose Monitoring Suppl (ACCU-CHEK GUIDE ME) w/Device KIT 1 each by Does not apply route 2 (two) times daily.   . Cholecalciferol (D3-1000) 1000 units tablet Take 1,000 Units by mouth daily.   . citalopram (CELEXA) 20 MG tablet Take 20 mg by mouth daily.   . diphenhydrAMINE (ALLERGY) 25 MG tablet Take 25 mg by mouth daily.   . Ferrous Sulfate 134 MG  TABS Take 1 tablet by mouth daily.   . folic acid (FOLVITE) 664 MCG tablet Take 400 mcg by mouth daily.   . furosemide (LASIX) 40 MG tablet TAKE 1 TABLET BY MOUTH ONCE DAILY AND  TAKE  XTRA  TAB  IF  NEEDED   . Garlic 4034 MG CAPS Take by mouth. 01/23/2016: Received from: [place]: Take by mouth.  Marland Kitchen glimepiride (AMARYL) 1 MG tablet Take 1 tablet (1 mg total) by mouth daily.   Marland Kitchen glucose blood (ACCU-CHEK GUIDE) test strip 1 each by Other route 2 (two) times daily. Use as instructed 2 times daily diag E11.65   . hydrALAZINE (APRESOLINE) 25 MG tablet TAKE 1 TABLET BY MOUTH THREE TIMES DAILY FOR BLOOD PRESSURE   . HYDROcodone-acetaminophen (NORCO/VICODIN) 5-325 MG tablet Take 1 tablet by mouth every 6 (six) hours as needed for moderate pain or severe pain.   . Insulin Pen Needle (PEN NEEDLES) 32G X 4 MM MISC 1 Units by Does not apply route daily. Use with victoza injections daily.   Marland Kitchen ketoconazole (NIZORAL) 2 % shampoo SHAMPOO AS DIRECTED   . linaclotide (LINZESS) 145 MCG CAPS capsule Take 1 capsule (145 mcg total) by mouth daily before breakfast.   . liraglutide (VICTOZA) 18 MG/3ML SOPN INJECT 1.8 MG ONCE DAILY SUBCUTANEOUSLY   . losartan (COZAAR) 50 MG tablet Take 0.5 tablets (25 mg total) by mouth daily. Take  1/2 tab   . meloxicam (MOBIC) 7.5 MG tablet TAKE 1 TABLET BY MOUTH ONCE DAILY FOR  GOUT  AND  ARTHRITIS   . methylPREDNISolone (MEDROL) 4 MG TBPK tablet Take by mouth as directed for 6 days   . montelukast (SINGULAIR) 10 MG tablet Take 1 tablet (10 mg total) by mouth daily.   . Omega-3 Fatty Acids (FISH OIL PO) Take by mouth. 01/23/2016: Received from: [place]: Take 1 capsule by mouth 2 (two) times daily.  . polyethylene glycol (MIRALAX / GLYCOLAX) packet Take 17 g by mouth daily as needed.   . Sulfacetamide Sodium (SODIUM SULFACETAMIDE) 10 % SHAM Apply topically.   Marland Kitchen tiZANidine (ZANAFLEX) 2 MG tablet Take 1 tablet (2 mg total) by  mouth every 8 (eight) hours as needed for muscle spasms.   . Vitamin D, Ergocalciferol, (DRISDOL) 50000 units CAPS capsule  01/23/2016: Received from: [facility]  . [DISCONTINUED] montelukast (SINGULAIR) 10 MG tablet Take 1 tablet (10 mg total) by mouth daily.   Marland Kitchen senna (SENOKOT) 8.6 MG TABS tablet Take 1 tablet (8.6 mg total) by mouth daily.    No facility-administered encounter medications on file as of 02/20/2019.    Surgical History: Past Surgical History:  Procedure Laterality Date  . ABDOMINAL HYSTERECTOMY    . BREAST BIOPSY Left 1989   neg  . CATARACT EXTRACTION W/PHACO Right 05/21/2015   Procedure: CATARACT EXTRACTION PHACO AND INTRAOCULAR LENS PLACEMENT (IOC);  Surgeon: Milus Height, MD;  Location: [facility] ORS;  Service: Ophthalmology;  Laterality: Right;  [place]: AP%: 10.0 CDE: 9.53  . COLONOSCOPY    . JOINT REPLACEMENT Bilateral    knee  . PARS PLANA VITRECTOMY Right 05/21/2015   Procedure: PARS PLANA VITRECTOMY WITH 25 GAUGE;  Surgeon: Milus Height, MD;  Location: [facility] ORS;  Service: Ophthalmology;  Laterality: Right;  Lot # [id number] H Endo laser: [place]: 300 Pulse Duration: 200 Total Pulses:143   . shoulder surgery      Medical History: Past Medical History:  Diagnosis Date  . Arthritis   . Bronchitis   . CHF (congestive heart failure) ([place])   . Chronic kidney disease    decreased kidney function  . Diabetes mellitus without complication ([place])   . Gout   . Hypertension   . Multiple allergies   . Shortness of breath dyspnea    w/ exertion  . Sleep apnea    CPAP  . Swelling of both lower extremities   . Wheezing     Family History: Family History  Problem Relation Age of Onset  . Breast cancer Cousin   . Breast cancer Other     Social History   Socioeconomic History  . Marital status: Divorced    Spouse name: Not on file  . Number of children: Not on file  . Years of education: Not on file  . Highest education level: Not on file   Occupational History  . Not on file  Tobacco Use  . Smoking status: Former Smoker    Types: Cigarettes  . Smokeless tobacco: Never Used  . Tobacco comment: 4 cigarettes quit 20 years ago  Substance and Sexual Activity  . Alcohol use: No  . Drug use: Never  . Sexual activity: Not on file  Other Topics Concern  . Not on file  Social History Narrative  . Not on file   Social Determinants of Health   Financial Resource Strain:   . Difficulty of Paying Living Expenses: Not on  file  Food Insecurity:   . Worried About Charity fundraiser in the Last Year: Not on file  . Ran Out of Food in the Last Year: Not on file  Transportation Needs:   . Lack of Transportation (Medical): Not on file  . Lack of Transportation (Non-Medical): Not on file  Physical Activity:   . Days of Exercise per Week: Not on file  . Minutes of Exercise per Session: Not on file  Stress:   . Feeling of Stress : Not on file  Social Connections:   . Frequency of Communication with Friends and Family: Not on file  . Frequency of Social Gatherings with Friends and Family: Not on file  . Attends Religious Services: Not on file  . Active Member of Clubs or Organizations: Not on file  . Attends Archivist Meetings: Not on file  . Marital Status: Not on file  Intimate Partner Violence:   . Fear of Current or Ex-Partner: Not on file  . Emotionally Abused: Not on file  . Physically Abused: Not on file  . Sexually Abused: Not on file      Review of Systems  Constitutional: Negative for chills, fatigue and unexpected weight change.  HENT: Negative for congestion, rhinorrhea, sneezing and sore throat.   Eyes: Negative for photophobia, pain and redness.  Respiratory: Negative for cough, chest tightness and shortness of breath.   Cardiovascular: Negative for chest pain and palpitations.  Gastrointestinal: Positive for constipation. Negative for abdominal pain, diarrhea, nausea and vomiting.  Endocrine:  Negative.   Genitourinary: Negative for dysuria and frequency.  Musculoskeletal: Negative for arthralgias, back pain, joint swelling and neck pain.  Skin: Negative for rash.  Allergic/Immunologic: Negative.   Neurological: Negative for tremors and numbness.  Hematological: Negative for adenopathy. Does not bruise/bleed easily.  Psychiatric/Behavioral: Negative for behavioral problems and sleep disturbance. The patient is not nervous/anxious.     Vital Signs: BP (!) 150/65   Temp (!) 97 F (36.1 C)   Resp 16   Ht '5\' 2"'$  (1.575 m)   Wt 233 lb (105.7 kg)   BMI 42.62 kg/m    Physical Exam Vitals and nursing note reviewed.  Constitutional:      General: She is not in acute distress.    Appearance: She is well-developed. She is not diaphoretic.  HENT:     Head: Normocephalic and atraumatic.     Mouth/Throat:     Pharynx: No oropharyngeal exudate.  Eyes:     Pupils: Pupils are equal, round, and reactive to light.  Neck:     Thyroid: No thyromegaly.     Vascular: No JVD.     Trachea: No tracheal deviation.  Cardiovascular:     Rate and Rhythm: Normal rate and regular rhythm.     Heart sounds: Normal heart sounds. No murmur. No friction rub. No gallop.   Pulmonary:     Effort: Pulmonary effort is normal. No respiratory distress.     Breath sounds: Normal breath sounds. No wheezing or rales.  Chest:     Chest wall: No tenderness.  Abdominal:     Palpations: Abdomen is soft.     Tenderness: There is no abdominal tenderness. There is no guarding.  Musculoskeletal:        General: Normal range of motion.     Cervical back: Normal range of motion and neck supple.  Lymphadenopathy:     Cervical: No cervical adenopathy.  Skin:    General: Skin is warm  and dry.  Neurological:     Mental Status: She is alert and oriented to person, place, and time.     Cranial Nerves: No cranial nerve deficit.  Psychiatric:        Behavior: Behavior normal.        Thought Content: Thought  content normal.        Judgment: Judgment normal.     Assessment/Plan: 1. Type 2 diabetes mellitus with hyperglycemia, unspecified whether long term insulin use (HCC) A1C 6.1 today, stable on current therapy, continue to monitor. - POCT HgB A1C  2. Chronic idiopathic constipation Start Senna, also given samples of Linzess 290 mcg tablets given. Also encouraged her to buy OTC magnesium citrate to help with having a BM since it has been 2 weeks. Will follow-up with patient in 5-7 days to see if she has had relief from constipation. If not, may need referral to GI. - senna (SENOKOT) 8.6 MG TABS tablet; Take 1 tablet (8.6 mg total) by mouth daily.  Dispense: 30 tablet; Refill: 0  3. Essential hypertension Stable on current therapy, continue to monitor.  4. Hypercalcemia Started on weekly Vitamin D 50,000 units and being followed by endocrinology. Scheduled to be seen by them again next month.  5. Chronic allergic rhinitis Stable on current therapy, continue to monitor. - montelukast (SINGULAIR) 10 MG tablet; Take 1 tablet (10 mg total) by mouth daily.  Dispense: 90 tablet; Refill: 2  General Counseling: Nyna verbalizes understanding of the findings of todays visit and agrees with plan of treatment. I have discussed any further diagnostic evaluation that may be needed or ordered today. We also reviewed her medications today. she has been encouraged to call the office with any questions or concerns that should arise related to todays visit.    Orders Placed This Encounter  Procedures  . POCT HgB A1C    Meds ordered this encounter  Medications  . senna (SENOKOT) 8.6 MG TABS tablet    Sig: Take 1 tablet (8.6 mg total) by mouth daily.    Dispense:  30 tablet    Refill:  0  . montelukast (SINGULAIR) 10 MG tablet    Sig: Take 1 tablet (10 mg total) by mouth daily.    Dispense:  90 tablet    Refill:  2    Please consider 90 day supplies to promote better adherence    Time spent:  20 Minutes   This patient was seen by Orson Gear AGNP-C in Collaboration with Dr Lavera Guise as a part of collaborative care agreement     Kendell Bane AGNP-C Internal medicine

## 2019-03-25 ENCOUNTER — Other Ambulatory Visit: Payer: Self-pay | Admitting: Adult Health

## 2019-03-25 DIAGNOSIS — K5904 Chronic idiopathic constipation: Secondary | ICD-10-CM

## 2019-04-06 ENCOUNTER — Telehealth: Payer: Self-pay

## 2019-04-06 NOTE — Telephone Encounter (Signed)
Confirmed appointment on 04/10/2019 and screened for covid. klh 

## 2019-04-09 ENCOUNTER — Other Ambulatory Visit: Payer: Self-pay

## 2019-04-10 ENCOUNTER — Ambulatory Visit: Payer: BC Managed Care – PPO | Admitting: Internal Medicine

## 2019-04-10 ENCOUNTER — Telehealth: Payer: Self-pay

## 2019-04-10 NOTE — Telephone Encounter (Signed)
Patient rescheduled appointment on 04/10/2019 to 04/24/2019. klh

## 2019-04-11 ENCOUNTER — Other Ambulatory Visit: Payer: Self-pay

## 2019-04-11 MED ORDER — BETAMETHASONE DIPROPIONATE 0.05 % EX CREA: TOPICAL_CREAM | Freq: Two times a day (BID) | CUTANEOUS | 1 refills | Status: DC

## 2019-04-19 ENCOUNTER — Telehealth: Payer: Self-pay

## 2019-04-19 NOTE — Telephone Encounter (Signed)
Called [facility]mom informing patient of appointment on 04/24/2019. klh

## 2019-04-20 ENCOUNTER — Other Ambulatory Visit: Payer: Self-pay | Admitting: Internal Medicine

## 2019-04-23 ENCOUNTER — Other Ambulatory Visit: Payer: Self-pay

## 2019-04-23 ENCOUNTER — Telehealth: Payer: Self-pay

## 2019-04-23 MED ORDER — ALLOPURINOL 300 MG PO TABS: 300.0000 mg | ORAL_TABLET | Freq: Every day | ORAL | 1 refills | Status: DC

## 2019-04-23 NOTE — Telephone Encounter (Signed)
Error

## 2019-04-24 ENCOUNTER — Ambulatory Visit (INDEPENDENT_AMBULATORY_CARE_PROVIDER_SITE_OTHER): Payer: Medicare HMO | Admitting: Internal Medicine

## 2019-04-24 ENCOUNTER — Other Ambulatory Visit: Payer: Self-pay

## 2019-04-24 ENCOUNTER — Encounter: Payer: Self-pay | Admitting: Internal Medicine

## 2019-04-24 VITALS — BP 147/64 | HR 73 | Temp 97.2°F | Resp 16 | Ht 62.0 in | Wt 231.0 lb

## 2019-04-24 DIAGNOSIS — J309 Allergic rhinitis, unspecified: Secondary | ICD-10-CM

## 2019-04-24 DIAGNOSIS — N183 Chronic kidney disease, stage 3 unspecified: Secondary | ICD-10-CM | POA: Diagnosis not present

## 2019-04-24 DIAGNOSIS — G4733 Obstructive sleep apnea (adult) (pediatric): Secondary | ICD-10-CM

## 2019-04-24 NOTE — Patient Instructions (Signed)

## 2019-04-24 NOTE — Progress Notes (Signed)
[facility] [place],  [address]  Pulmonary Sleep Medicine   Office Visit Note  Patient Name: EBONIE WESTERLUND DOB: 07/26/1945 MRN [id number]  Date of Service: 04/24/2019  Complaints/HPI: OSA patient is here for follow-up of obstructive sleep apnea. She states that she is using the CPAP as prescribed states that she has been having good results with the CPAP device. She states she is using it for most of the night denies having any sinusitis. She denies having any cough no congestion noted. Denies having any fevers or chills. States that she has had no major weight gain actually weighs about the same. She has not had any headaches.  ROS  General: (-) fever, (-) chills, (-) night sweats, (-) weakness Skin: (-) rashes, (-) itching,. Eyes: (-) visual changes, (-) redness, (-) itching. Nose and Sinuses: (-) nasal stuffiness or itchiness, (-) postnasal drip, (-) nosebleeds, (-) sinus trouble. Mouth and Throat: (-) sore throat, (-) hoarseness. Neck: (-) swollen glands, (-) enlarged thyroid, (-) neck pain. Respiratory: - cough, (-) bloody sputum, - shortness of breath, - wheezing. Cardiovascular: + ankle swelling, (-) chest pain. Lymphatic: (-) lymph node enlargement. Neurologic: (-) numbness, (-) tingling. Psychiatric: (-) anxiety, (-) depression   Current Medication: Outpatient Encounter Medications as of 04/24/2019  Medication Sig Note  . Accu-Chek FastClix Lancets MISC Use as instructed 2 times daily diag E11.65   . allopurinol (ZYLOPRIM) 300 MG tablet Take 1 tablet (300 mg total) by mouth daily.   Marland Kitchen aspirin EC 325 MG tablet Take 325 mg by mouth. 01/23/2016: Received from: [place]: Take 325 mg by mouth.  Marland Kitchen atorvastatin (LIPITOR) 20 MG tablet Take 1 tablet (20 mg total) by mouth daily.   . betamethasone dipropionate 0.05 % cream Apply topically 2 (two) times daily.   . bisoprolol-hydrochlorothiazide (ZIAC) 2.5-6.25 MG tablet Take 1  tablet by mouth 2 (two) times daily.   . Blood Glucose Monitoring Suppl (ACCU-CHEK GUIDE ME) w/Device KIT 1 each by Does not apply route 2 (two) times daily.   . Cholecalciferol (D3-1000) 1000 units tablet Take 1,000 Units by mouth daily.   . citalopram (CELEXA) 20 MG tablet TAKE 1 TABLET BY MOUTH ONCE DAILY FOR DEPRESSION   . diphenhydrAMINE (ALLERGY) 25 MG tablet Take 25 mg by mouth daily.   . Ferrous Sulfate 134 MG TABS Take 1 tablet by mouth daily.   . folic acid (FOLVITE) 710 MCG tablet Take 400 mcg by mouth daily.   . furosemide (LASIX) 40 MG tablet TAKE 1 TABLET BY MOUTH ONCE DAILY AND  TAKE  XTRA  TAB  IF  NEEDED   . Garlic 6269 MG CAPS Take by mouth. 01/23/2016: Received from: [place]: Take by mouth.  Marland Kitchen glimepiride (AMARYL) 1 MG tablet Take 1 tablet (1 mg total) by mouth daily.   Marland Kitchen glucose blood (ACCU-CHEK GUIDE) test strip 1 each by Other route 2 (two) times daily. Use as instructed 2 times daily diag E11.65   . hydrALAZINE (APRESOLINE) 25 MG tablet TAKE 1 TABLET BY MOUTH THREE TIMES DAILY FOR BLOOD PRESSURE   . HYDROcodone-acetaminophen (NORCO/VICODIN) 5-325 MG tablet Take 1 tablet by mouth every 6 (six) hours as needed for moderate pain or severe pain.   . Insulin Pen Needle (PEN NEEDLES) 32G X 4 MM MISC 1 Units by Does not apply route daily. Use with victoza injections daily.   Marland Kitchen ketoconazole (NIZORAL) 2 % shampoo SHAMPOO AS DIRECTED   .  linaclotide (LINZESS) 145 MCG CAPS capsule Take 1 capsule (145 mcg total) by mouth daily before breakfast.   . liraglutide (VICTOZA) 18 MG/3ML SOPN INJECT 1.8 MG ONCE DAILY SUBCUTANEOUSLY   . losartan (COZAAR) 50 MG tablet Take 0.5 tablets (25 mg total) by mouth daily. Take 1/2 tab   . meloxicam (MOBIC) 7.5 MG tablet TAKE 1 TABLET BY MOUTH ONCE DAILY FOR  GOUT  AND  ARTHRITIS   . methylPREDNISolone (MEDROL) 4 MG TBPK tablet Take by mouth as directed for 6 days   . montelukast (SINGULAIR) 10 MG tablet Take 1 tablet (10  mg total) by mouth daily.   . Omega-3 Fatty Acids (FISH OIL PO) Take by mouth. 01/23/2016: Received from: [place]: Take 1 capsule by mouth 2 (two) times daily.  . polyethylene glycol (MIRALAX / GLYCOLAX) packet Take 17 g by mouth daily as needed.   . senna (SENOKOT) 8.6 MG TABS tablet Take 1 tablet by mouth once daily   . Sulfacetamide Sodium (SODIUM SULFACETAMIDE) 10 % SHAM Apply topically.   Marland Kitchen tiZANidine (ZANAFLEX) 2 MG tablet Take 1 tablet (2 mg total) by mouth every 8 (eight) hours as needed for muscle spasms.   . Vitamin D, Ergocalciferol, (DRISDOL) 50000 units CAPS capsule  01/23/2016: Received from: [place]   No facility-administered encounter medications on file as of 04/24/2019.    Surgical History: Past Surgical History:  Procedure Laterality Date  . ABDOMINAL HYSTERECTOMY    . BREAST BIOPSY Left 1989   neg  . CATARACT EXTRACTION W/PHACO Right 05/21/2015   Procedure: CATARACT EXTRACTION PHACO AND INTRAOCULAR LENS PLACEMENT (IOC);  Surgeon: Milus Height, MD;  Location: [facility] ORS;  Service: Ophthalmology;  Laterality: Right;  [place]: AP%: 10.0 CDE: 9.53  . COLONOSCOPY    . JOINT REPLACEMENT Bilateral    knee  . PARS PLANA VITRECTOMY Right 05/21/2015   Procedure: PARS PLANA VITRECTOMY WITH 25 GAUGE;  Surgeon: Milus Height, MD;  Location: [facility] ORS;  Service: Ophthalmology;  Laterality: Right;  Lot # [id number] H Endo laser: Watts: 300 Pulse Duration: 200 Total Pulses:143   . shoulder surgery      Medical History: Past Medical History:  Diagnosis Date  . Arthritis   . Bronchitis   . CHF (congestive heart failure) ([place])   . Chronic kidney disease    decreased kidney function  . Diabetes mellitus without complication ([place])   . Gout   . Hypertension   . Multiple allergies   . Shortness of breath dyspnea    w/ exertion  . Sleep apnea    CPAP  . Swelling of both lower extremities   . Wheezing     Family History: Family  History  Problem Relation Age of Onset  . Breast cancer Cousin   . Breast cancer Other     Social History: Social History   Socioeconomic History  . Marital status: Divorced    Spouse name: Not on file  . Number of children: Not on file  . Years of education: Not on file  . Highest education level: Not on file  Occupational History  . Not on file  Tobacco Use  . Smoking status: Former Smoker    Types: Cigarettes  . Smokeless tobacco: Never Used  . Tobacco comment: 4 cigarettes quit 20 years ago  Substance and Sexual Activity  . Alcohol use: No  . Drug use: Never  . Sexual activity: Not on file  Other Topics Concern  . Not on  file  Social History Narrative  . Not on file   Social Determinants of Health   Financial Resource Strain:   . Difficulty of Paying Living Expenses:   Food Insecurity:   . Worried About Charity fundraiser in the Last Year:   . Arboriculturist in the Last Year:   Transportation Needs:   . Film/video editor (Medical):   Marland Kitchen Lack of Transportation (Non-Medical):   Physical Activity:   . Days of Exercise per Week:   . Minutes of Exercise per Session:   Stress:   . Feeling of Stress :   Social Connections:   . Frequency of Communication with Friends and Family:   . Frequency of Social Gatherings with Friends and Family:   . Attends Religious Services:   . Active Member of Clubs or Organizations:   . Attends Archivist Meetings:   Marland Kitchen Marital Status:   Intimate Partner Violence:   . Fear of Current or Ex-Partner:   . Emotionally Abused:   Marland Kitchen Physically Abused:   . Sexually Abused:     Vital Signs: Blood pressure (!) 147/64, pulse 73, temperature (!) 97.2 F (36.2 C), resp. rate 16, height '5\' 2"'$  (1.575 m), weight 231 lb (104.8 kg), SpO2 90 %.  Examination: General Appearance: The patient is well-developed, well-nourished, and in no distress. Skin: Gross inspection of skin unremarkable. Head: normocephalic, no gross  deformities. Eyes: no gross deformities noted. ENT: ears appear grossly normal no exudates. Neck: Supple. No thyromegaly. No LAD. Respiratory: No rhonchi no rales are noted at this time. Cardiovascular: Normal S1 and S2 without murmur or rub. Extremities: No cyanosis. pulses are equal. Neurologic: Alert and oriented. No involuntary movements.  LABS: Recent Results (from the past 2160 hour(s))  POCT HgB A1C     Status: Abnormal   Collection Time: 02/20/19 11:52 AM  Result Value Ref Range   Hemoglobin A1C 6.1 (A) 4.0 - 5.6 %   HbA1c POC (<> result, manual entry)     HbA1c, POC (prediabetic range)     HbA1c, POC (controlled diabetic range)      Radiology: MM 3D SCREEN BREAST BILATERAL  Result Date: 10/02/2018 CLINICAL DATA:  Screening. EXAM: DIGITAL SCREENING BILATERAL MAMMOGRAM WITH TOMO AND CAD COMPARISON:  Previous exam(s). ACR Breast Density Category b: There are scattered areas of fibroglandular density. FINDINGS: There are no findings suspicious for malignancy. Images were processed with CAD. IMPRESSION: No mammographic evidence of malignancy. A result letter of this screening mammogram will be mailed directly to the patient. RECOMMENDATION: Screening mammogram in one year. (Code:SM-B-01Y) BI-RADS CATEGORY  1: Negative. Electronically Signed   By: Curlene Dolphin M.D.   On: 10/02/2018 16:48    No results found.  No results found.    Assessment and Plan: Patient Active Problem List   Diagnosis Date Noted  . Acute bilateral low back pain with right-sided sciatica 11/12/2018  . Closed fracture of right proximal humerus 11/12/2018  . Type 2 diabetes mellitus with hyperglycemia ([place]) 10/28/2018  . Flu vaccine need 10/28/2018  . Pain due to onychomycosis of toenails of both feet 07/17/2018  . Type 2 diabetes mellitus with vascular disease ([place]) 07/17/2018  . Other fatigue 11/14/2017  . Moderate episode of recurrent major depressive disorder ([place]) 11/14/2017  . Chronic allergic  rhinitis 11/14/2017  . Primary insomnia 11/14/2017  . Uncontrolled type 2 diabetes mellitus with hyperglycemia ([place]) 09/18/2017  . Acute pain of right shoulder 09/18/2017  . Type 2 diabetes  mellitus with chronic kidney disease ([place]) 01/19/2017  . Anosmia 01/19/2017  . Cervicalgia 01/19/2017  . Vitamin D deficiency 01/19/2017  . Hypoglycemia 01/19/2017  . Obstructive sleep apnea 01/19/2017  . Essential hypertension, benign 01/19/2017  . Mixed hyperlipidemia 01/19/2017  . Anemia, unspecified 01/19/2017  . Edema 01/19/2017  . SOB (shortness of breath) 01/19/2017  . Gout 01/19/2017  . Morbid obesity ([place]) 01/19/2017  . Stage 3 chronic kidney disease 01/19/2017  . Hypercalcemia 01/19/2017  . Iron deficiency anemia 01/14/2016  . Acute pain of right knee 04/23/2015  . Left wrist pain 08/12/2014  . Status post bilateral unicompartmental knee replacement 11/13/2013  . Arthritis, senescent 08/02/2013    1. OSA well controlled right now on CPAP we will continue with current pressure settings. Patient had severe OSA noted on the sleep study. She has been controlled with the BiPAP. 2. Obesity again discussed dietary management she needs to work on diet and exercise and weight loss 3. Chronic Allergic Rhinitis she is going to work with medical management at this time for her allergies. She is not had any issues so far but it is still early in the season. 4. CKD III follows with nephrology she will continue with nephrology recommendations labs have been stable.  General Counseling: I have discussed the findings of the evaluation and examination with Pamala Hurry.  I have also discussed any further diagnostic evaluation thatmay be needed or ordered today. Lilliahna verbalizes understanding of the findings of todays visit. We also reviewed her medications today and discussed drug interactions and side effects including but not limited excessive drowsiness and altered mental states. We also discussed that  there is always a risk not just to her but also people around her. she has been encouraged to call the office with any questions or concerns that should arise related to todays visit.  No orders of the defined types were placed in this encounter.    Time spent: 35 minutes  I have personally obtained a history, examined the patient, evaluated laboratory and imaging results, formulated the assessment and plan and placed orders.    Allyne Gee, MD [facility] Pulmonary and Critical Care Sleep medicine

## 2019-04-25 ENCOUNTER — Other Ambulatory Visit: Payer: Self-pay

## 2019-04-26 ENCOUNTER — Ambulatory Visit: Payer: Medicare HMO | Admitting: Podiatry

## 2019-04-30 ENCOUNTER — Ambulatory Visit: Payer: Medicare HMO | Admitting: Podiatry

## 2019-05-03 ENCOUNTER — Ambulatory Visit: Payer: Medicare HMO | Admitting: Podiatry

## 2019-05-17 ENCOUNTER — Other Ambulatory Visit: Payer: Self-pay

## 2019-05-17 MED ORDER — TRUEPLUS LANCETS 33G MISC: 1.0000 | Freq: Two times a day (BID) | 3 refills | Status: DC

## 2019-05-17 MED ORDER — TRUE METRIX BLOOD GLUCOSE TEST VI STRP: 1.0000 | ORAL_STRIP | Freq: Two times a day (BID) | 3 refills | Status: DC

## 2019-05-17 MED ORDER — BD SWAB SINGLE USE REGULAR PADS: MEDICATED_PAD | 3 refills | Status: DC

## 2019-05-17 MED ORDER — TRUE METRIX LEVEL 1 LOW VI SOLN: 0 refills | Status: DC

## 2019-05-17 MED ORDER — TRUE METRIX AIR GLUCOSE METER W/DEVICE KIT: PACK | 0 refills | Status: DC

## 2019-05-17 NOTE — Telephone Encounter (Signed)
lmom to pt to call us back for as per Mcarthur Rossetti we send new glucometer to [facility] order phar

## 2019-05-25 ENCOUNTER — Other Ambulatory Visit: Payer: Self-pay

## 2019-05-25 MED ORDER — BD SWAB SINGLE USE REGULAR PADS: MEDICATED_PAD | 3 refills | Status: DC

## 2019-05-25 MED ORDER — TRUE METRIX LEVEL 1 LOW VI SOLN: 0 refills | Status: DC

## 2019-05-28 ENCOUNTER — Other Ambulatory Visit: Payer: Self-pay

## 2019-05-28 MED ORDER — TRUE METRIX BLOOD GLUCOSE TEST VI STRP: 1.0000 | ORAL_STRIP | Freq: Two times a day (BID) | 3 refills | Status: DC

## 2019-05-28 MED ORDER — TRUEPLUS LANCETS 33G MISC: 1.0000 | Freq: Two times a day (BID) | 3 refills | Status: DC

## 2019-05-28 MED ORDER — TRUE METRIX AIR GLUCOSE METER W/DEVICE KIT: PACK | 0 refills | Status: DC

## 2019-05-29 ENCOUNTER — Ambulatory Visit: Payer: Medicare HMO | Admitting: Internal Medicine

## 2019-06-22 ENCOUNTER — Telehealth: Payer: Self-pay

## 2019-06-22 NOTE — Telephone Encounter (Signed)
Lmom to confirm and screen for 06-26-19 ov.

## 2019-06-25 ENCOUNTER — Other Ambulatory Visit: Payer: Self-pay | Admitting: Adult Health

## 2019-06-26 ENCOUNTER — Encounter: Payer: Self-pay | Admitting: Internal Medicine

## 2019-06-26 ENCOUNTER — Ambulatory Visit (INDEPENDENT_AMBULATORY_CARE_PROVIDER_SITE_OTHER): Payer: Medicare HMO | Admitting: Internal Medicine

## 2019-06-26 ENCOUNTER — Other Ambulatory Visit: Payer: Self-pay

## 2019-06-26 DIAGNOSIS — I10 Essential (primary) hypertension: Secondary | ICD-10-CM

## 2019-06-26 DIAGNOSIS — E782 Mixed hyperlipidemia: Secondary | ICD-10-CM

## 2019-06-26 DIAGNOSIS — E1165 Type 2 diabetes mellitus with hyperglycemia: Secondary | ICD-10-CM

## 2019-06-26 DIAGNOSIS — E1121 Type 2 diabetes mellitus with diabetic nephropathy: Secondary | ICD-10-CM

## 2019-06-26 DIAGNOSIS — N1832 Chronic kidney disease, stage 3b: Secondary | ICD-10-CM | POA: Diagnosis not present

## 2019-06-26 DIAGNOSIS — E1122 Type 2 diabetes mellitus with diabetic chronic kidney disease: Secondary | ICD-10-CM

## 2019-06-26 DIAGNOSIS — I5189 Other ill-defined heart diseases: Secondary | ICD-10-CM

## 2019-06-26 LAB — POCT GLYCOSYLATED HEMOGLOBIN (HGB A1C): Hemoglobin A1C: 5.8 % — AB (ref 4.0–5.6)

## 2019-06-26 NOTE — Progress Notes (Signed)
[facility] [place], [place] [address]  Internal MEDICINE  Office Visit Note  Patient Name: Frances Greene  [id number]  [id number]  Date of Service: 06/29/2019  Chief Complaint  Patient presents with  . Diabetes  . Hypertension  . Gout  . Diastolic dysfunction    HPI Pt is here for routine follow up, denies any major complaints, did see ortho for left shoulder pain? Avascular necrosis of humeral head. She feels better Improving diabetic numbers, pt does have h/o sleep apnea and is compliant with her CPAP. H/o diastolic dysfunction as well however has improved, will need follow up echo Seen by nephrology for CKD, hypercalcemia is better  Current Medication: Outpatient Encounter Medications as of 06/26/2019  Medication Sig Note  . Alcohol Swabs (B-D SINGLE USE SWABS REGULAR) PADS Use as directed twice daily DX E11.65   . allopurinol (ZYLOPRIM) 300 MG tablet Take 1 tablet (300 mg total) by mouth daily.   Marland Kitchen aspirin EC 325 MG tablet Take 325 mg by mouth. 01/23/2016: Received from: [place]: Take 325 mg by mouth.  Marland Kitchen atorvastatin (LIPITOR) 20 MG tablet Take 1 tablet (20 mg total) by mouth daily.   . betamethasone dipropionate 0.05 % cream Apply topically 2 (two) times daily.   . bisoprolol-hydrochlorothiazide (ZIAC) 2.5-6.25 MG tablet Take 1 tablet by mouth twice daily   . Blood Glucose Calibration (TRUE METRIX LEVEL 1) Low SOLN Use as directed   . Blood Glucose Monitoring Suppl (TRUE METRIX AIR GLUCOSE METER) w/Device KIT Use as directed to check glucose DX E11.65   . Cholecalciferol (D3-1000) 1000 units tablet Take 1,000 Units by mouth daily.   . cinacalcet (SENSIPAR) 30 MG tablet    . citalopram (CELEXA) 20 MG tablet TAKE 1 TABLET BY MOUTH ONCE DAILY FOR DEPRESSION   . diphenhydrAMINE (ALLERGY) 25 MG tablet Take 25 mg by mouth daily.   . Ferrous Sulfate 134 MG TABS Take 1 tablet by mouth daily.   . folic acid (FOLVITE) 712 MCG tablet Take  400 mcg by mouth daily.   . furosemide (LASIX) 40 MG tablet TAKE 1 TABLET BY MOUTH ONCE DAILY AND  TAKE  XTRA  TAB  IF  NEEDED   . Garlic 1975 MG CAPS Take by mouth. 01/23/2016: Received from: [place]: Take by mouth.  Marland Kitchen glimepiride (AMARYL) 1 MG tablet Take 1 tablet (1 mg total) by mouth daily.   Marland Kitchen glucose blood (TRUE METRIX BLOOD GLUCOSE TEST) test strip 1 each by Other route in the morning and at bedtime. Dx E11.65   . hydrALAZINE (APRESOLINE) 25 MG tablet TAKE 1 TABLET BY MOUTH THREE TIMES DAILY FOR BLOOD PRESSURE   . HYDROcodone-acetaminophen (NORCO/VICODIN) 5-325 MG tablet Take 1 tablet by mouth every 6 (six) hours as needed for moderate pain or severe pain.   . Insulin Pen Needle (PEN NEEDLES) 32G X 4 MM MISC 1 Units by Does not apply route daily. Use with victoza injections daily.   Marland Kitchen ketoconazole (NIZORAL) 2 % shampoo SHAMPOO AS DIRECTED   . linaclotide (LINZESS) 145 MCG CAPS capsule Take 1 capsule (145 mcg total) by mouth daily before breakfast.   . liraglutide (VICTOZA) 18 MG/3ML SOPN INJECT 1.8 MG ONCE DAILY SUBCUTANEOUSLY   . losartan (COZAAR) 50 MG tablet Take 0.5 tablets (25 mg total) by mouth daily. Take 1/2 tab   . meloxicam (MOBIC) 7.5 MG tablet TAKE 1 TABLET BY MOUTH ONCE DAILY FOR  GOUT  AND  ARTHRITIS   . methylPREDNISolone (MEDROL) 4 MG TBPK tablet Take by mouth as directed for 6 days   . montelukast (SINGULAIR) 10 MG tablet Take 1 tablet (10 mg total) by mouth daily.   . Omega-3 Fatty Acids (FISH OIL PO) Take by mouth. 01/23/2016: Received from: [place]: Take 1 capsule by mouth 2 (two) times daily.  . polyethylene glycol (MIRALAX / GLYCOLAX) packet Take 17 g by mouth daily as needed.   . senna (SENOKOT) 8.6 MG TABS tablet Take 1 tablet by mouth.   . Sulfacetamide Sodium (SODIUM SULFACETAMIDE) 10 % SHAM Apply topically.   Marland Kitchen tiZANidine (ZANAFLEX) 2 MG tablet Take 1 tablet (2 mg total) by mouth every 8 (eight)  hours as needed for muscle spasms.   . TRUEplus Lancets 33G MISC 1 each by Does not apply route in the morning and at bedtime. DX E11.65   . Vitamin D, Ergocalciferol, (DRISDOL) 50000 units CAPS capsule  01/23/2016: Received from: [facility]  . [DISCONTINUED] senna (SENOKOT) 8.6 MG TABS tablet Take 1 tablet by mouth once daily    No facility-administered encounter medications on file as of 06/26/2019.    Surgical History: Past Surgical History:  Procedure Laterality Date  . ABDOMINAL HYSTERECTOMY    . BREAST BIOPSY Left 1989   neg  . CATARACT EXTRACTION W/PHACO Right 05/21/2015   Procedure: CATARACT EXTRACTION PHACO AND INTRAOCULAR LENS PLACEMENT (IOC);  Surgeon: Milus Height, MD;  Location: [facility] ORS;  Service: Ophthalmology;  Laterality: Right;  [place]: AP%: 10.0 CDE: 9.53  . COLONOSCOPY    . JOINT REPLACEMENT Bilateral    knee  . PARS PLANA VITRECTOMY Right 05/21/2015   Procedure: PARS PLANA VITRECTOMY WITH 25 GAUGE;  Surgeon: Milus Height, MD;  Location: [facility] ORS;  Service: Ophthalmology;  Laterality: Right;  Lot # [id number] H Endo laser: Watts: 300 Pulse Duration: 200 Total Pulses:143   . shoulder surgery      Medical History: Past Medical History:  Diagnosis Date  . Arthritis   . Bronchitis   . CHF (congestive heart failure) ([place])   . Chronic kidney disease    decreased kidney function  . Diabetes mellitus without complication ([place])   . Gout   . Hypertension   . Multiple allergies   . Shortness of breath dyspnea    w/ exertion  . Sleep apnea    CPAP  . Swelling of both lower extremities   . Wheezing     Family History: Family History  Problem Relation Age of Onset  . Breast cancer Cousin   . Breast cancer Other     Social History   Socioeconomic History  . Marital status: Divorced    Spouse name: Not on file  . Number of children: Not on file  . Years of education: Not on file  . Highest education level: Not on file  Occupational History   . Not on file  Tobacco Use  . Smoking status: Former Smoker    Types: Cigarettes  . Smokeless tobacco: Never Used  . Tobacco comment: 4 cigarettes quit 20 years ago  Vaping Use  . Vaping Use: Never used  Substance and Sexual Activity  . Alcohol use: No  . Drug use: Never  . Sexual activity: Not on file  Other Topics Concern  . Not on file  Social History Narrative  . Not on file   Social Determinants of Health   Financial Resource Strain:   . Difficulty of  Paying Living Expenses:   Food Insecurity:   . Worried About Charity fundraiser in the Last Year:   . Arboriculturist in the Last Year:   Transportation Needs:   . Film/video editor (Medical):   Marland Kitchen Lack of Transportation (Non-Medical):   Physical Activity:   . Days of Exercise per Week:   . Minutes of Exercise per Session:   Stress:   . Feeling of Stress :   Social Connections:   . Frequency of Communication with Friends and Family:   . Frequency of Social Gatherings with Friends and Family:   . Attends Religious Services:   . Active Member of Clubs or Organizations:   . Attends Archivist Meetings:   Marland Kitchen Marital Status:   Intimate Partner Violence:   . Fear of Current or Ex-Partner:   . Emotionally Abused:   Marland Kitchen Physically Abused:   . Sexually Abused:     Review of Systems  Constitutional: Negative for chills, fatigue and unexpected weight change.  HENT: Positive for postnasal drip. Negative for congestion, rhinorrhea, sneezing and sore throat.   Eyes: Negative for redness.  Respiratory: Negative for cough, chest tightness and shortness of breath.   Cardiovascular: Negative for chest pain and palpitations.  Gastrointestinal: Negative for abdominal pain, constipation, diarrhea, nausea and vomiting.  Genitourinary: Negative for dysuria and frequency.  Musculoskeletal: Negative for arthralgias, back pain, joint swelling and neck pain.  Skin: Negative for rash.  Neurological: Negative.  Negative for  tremors and numbness.  Hematological: Negative for adenopathy. Does not bruise/bleed easily.  Psychiatric/Behavioral: Negative for behavioral problems (Depression), sleep disturbance and suicidal ideas. The patient is not nervous/anxious.     Vital Signs: BP 139/60   Pulse 71   Temp (!) 97.2 F (36.2 C)   Resp 16   Ht '5\' 2"'$  (1.575 m)   Wt 227 lb (103 kg)   SpO2 98%   BMI 41.52 kg/m    Physical Exam Constitutional:      General: She is not in acute distress.    Appearance: She is well-developed. She is not diaphoretic.  HENT:     Head: Normocephalic and atraumatic.     Mouth/Throat:     Pharynx: No oropharyngeal exudate.  Eyes:     Pupils: Pupils are equal, round, and reactive to light.  Neck:     Thyroid: No thyromegaly.     Vascular: No JVD.     Trachea: No tracheal deviation.  Cardiovascular:     Rate and Rhythm: Normal rate and regular rhythm.     Heart sounds: Normal heart sounds. No murmur heard.  No friction rub. No gallop.   Pulmonary:     Effort: Pulmonary effort is normal. No respiratory distress.     Breath sounds: No wheezing or rales.  Chest:     Chest wall: No tenderness.  Abdominal:     General: Bowel sounds are normal.     Palpations: Abdomen is soft.  Musculoskeletal:        General: Normal range of motion.     Cervical back: Normal range of motion and neck supple.  Lymphadenopathy:     Cervical: No cervical adenopathy.  Skin:    General: Skin is warm and dry.  Neurological:     Mental Status: She is alert and oriented to person, place, and time.     Cranial Nerves: No cranial nerve deficit.  Psychiatric:        Behavior: Behavior normal.  Thought Content: Thought content normal.        Judgment: Judgment normal.   Diabetic foot exam was performed with the following findings:  Normal  Ophthalmology negative diabetic eye disease   Assessment/Plan:  1. Type 2 diabetes mellitus with stage 3b chronic kidney disease, without long-term  current use of insulin (HCC) - Improved hg a1c, continue all meds as before   - POCT HgB A1C - TSH - T4, free - Urinalysis - will need foot and eye exam   2. Essential hypertension - Controlled and stable  - Comprehensive metabolic panel - Urinalysis  3. Mixed hyperlipidemia - Controlled  - Lipid Panel With LDL/HDL Ratio  4. Stage 3b chronic kidney disease - per nephrology, stable,  - CBC with Differential/Platelet  5. Diastolic dysfunction without heart failure - will need follow up echo on next visit General Counseling: Carloyn Manner understanding of the findings of todays visit and agrees with plan of treatment. I have discussed any further diagnostic evaluation that may be needed or ordered today. We also reviewed her medications today. she has been encouraged to call the office with any questions or concerns that should arise related to todays visit.  Orders Placed This Encounter  Procedures  . CBC with Differential/Platelet  . Lipid Panel With LDL/HDL Ratio  . TSH  . T4, free  . Comprehensive metabolic panel  . Urinalysis  . POCT HgB A1C     Total time spent: 30 Minutes Time spent includes review of chart, medications, test results, and follow up plan with the patient.   Dr Lavera Guise Internal medicine

## 2019-07-06 ENCOUNTER — Other Ambulatory Visit: Payer: Self-pay | Admitting: Adult Health

## 2019-07-19 ENCOUNTER — Other Ambulatory Visit: Payer: Self-pay

## 2019-07-19 DIAGNOSIS — M5441 Lumbago with sciatica, right side: Secondary | ICD-10-CM

## 2019-07-19 MED ORDER — TIZANIDINE HCL 2 MG PO TABS: 2.0000 mg | ORAL_TABLET | Freq: Three times a day (TID) | ORAL | 0 refills | Status: DC | PRN

## 2019-07-30 ENCOUNTER — Other Ambulatory Visit: Payer: Self-pay

## 2019-07-30 ENCOUNTER — Other Ambulatory Visit: Payer: Self-pay | Admitting: Adult Health

## 2019-07-30 MED ORDER — FUROSEMIDE 40 MG PO TABS: ORAL_TABLET | ORAL | 6 refills | Status: DC

## 2019-07-31 ENCOUNTER — Ambulatory Visit: Payer: Medicare HMO | Admitting: Internal Medicine

## 2019-08-01 ENCOUNTER — Other Ambulatory Visit: Payer: Self-pay | Admitting: Nurse Practitioner

## 2019-08-01 DIAGNOSIS — I10 Essential (primary) hypertension: Secondary | ICD-10-CM

## 2019-08-01 MED ORDER — FUROSEMIDE 40 MG PO TABS: ORAL_TABLET | ORAL | 1 refills | Status: DC

## 2019-08-25 LAB — LIPID PANEL WITH LDL/HDL RATIO
Cholesterol, Total: 129 mg/dL (ref 100–199)
HDL: 54 mg/dL (ref >39)
LDL Chol Calc (NIH): 59 mg/dL (ref 0–99)
LDL/HDL Ratio: 1.1 ratio (ref 0.0–3.2)
Triglycerides: 84 mg/dL (ref 0–149)
VLDL Cholesterol Cal: 16 mg/dL (ref 5–40)

## 2019-08-25 LAB — COMPREHENSIVE METABOLIC PANEL
ALT: 8 IU/L (ref 0–32)
AST: 12 IU/L (ref 0–40)
Albumin/Globulin Ratio: 2 (ref 1.2–2.2)
Albumin: 4.1 g/dL (ref 3.7–4.7)
Alkaline Phosphatase: 66 IU/L (ref 48–121)
BUN/Creatinine Ratio: 19 (ref 12–28)
BUN: 35 mg/dL — ABNORMAL HIGH (ref 8–27)
Bilirubin Total: 0.5 mg/dL (ref 0.0–1.2)
CO2: 25 mmol/L (ref 20–29)
Calcium: 9.3 mg/dL (ref 8.7–10.3)
Chloride: 102 mmol/L (ref 96–106)
Creatinine, Ser: 1.89 mg/dL — ABNORMAL HIGH (ref 0.57–1.00)
GFR calc Af Amer: 30 mL/min/{1.73_m2} — ABNORMAL LOW (ref >59)
GFR calc non Af Amer: 26 mL/min/{1.73_m2} — ABNORMAL LOW (ref >59)
Globulin, Total: 2.1 g/dL (ref 1.5–4.5)
Glucose: 155 mg/dL — ABNORMAL HIGH (ref 65–99)
Potassium: 3.9 mmol/L (ref 3.5–5.2)
Sodium: 143 mmol/L (ref 134–144)
Total Protein: 6.2 g/dL (ref 6.0–8.5)

## 2019-08-25 LAB — CBC WITH DIFFERENTIAL/PLATELET
Basophils Absolute: 0 10*3/uL (ref 0.0–0.2)
Basos: 1 %
EOS (ABSOLUTE): 0.2 10*3/uL (ref 0.0–0.4)
Eos: 4 %
Hematocrit: 30.2 % — ABNORMAL LOW (ref 34.0–46.6)
Hemoglobin: 10.1 g/dL — ABNORMAL LOW (ref 11.1–15.9)
Immature Grans (Abs): 0 10*3/uL (ref 0.0–0.1)
Immature Granulocytes: 1 %
Lymphocytes Absolute: 0.8 10*3/uL (ref 0.7–3.1)
Lymphs: 15 %
MCH: 28.1 pg (ref 26.6–33.0)
MCHC: 33.4 g/dL (ref 31.5–35.7)
MCV: 84 fL (ref 79–97)
Monocytes Absolute: 0.5 10*3/uL (ref 0.1–0.9)
Monocytes: 9 %
Neutrophils Absolute: 3.9 10*3/uL (ref 1.4–7.0)
Neutrophils: 70 %
Platelets: 142 10*3/uL — ABNORMAL LOW (ref 150–450)
RBC: 3.59 x10E6/uL — ABNORMAL LOW (ref 3.77–5.28)
RDW: 13.4 % (ref 11.7–15.4)
WBC: 5.5 10*3/uL (ref 3.4–10.8)

## 2019-08-25 LAB — TSH: TSH: 3.48 u[IU]/mL (ref 0.450–4.500)

## 2019-08-25 LAB — T4, FREE: Free T4: 1.2 ng/dL (ref 0.82–1.77)

## 2019-08-31 ENCOUNTER — Telehealth: Payer: Self-pay

## 2019-08-31 NOTE — Telephone Encounter (Signed)
conFirmed office visit on 8/17

## 2019-09-04 ENCOUNTER — Other Ambulatory Visit: Payer: Self-pay

## 2019-09-04 ENCOUNTER — Encounter: Payer: Self-pay | Admitting: Hospice and Palliative Medicine

## 2019-09-04 ENCOUNTER — Ambulatory Visit (INDEPENDENT_AMBULATORY_CARE_PROVIDER_SITE_OTHER): Payer: Medicare HMO | Admitting: Hospice and Palliative Medicine

## 2019-09-04 DIAGNOSIS — I5189 Other ill-defined heart diseases: Secondary | ICD-10-CM

## 2019-09-04 DIAGNOSIS — D638 Anemia in other chronic diseases classified elsewhere: Secondary | ICD-10-CM

## 2019-09-04 DIAGNOSIS — Z0001 Encounter for general adult medical examination with abnormal findings: Secondary | ICD-10-CM | POA: Diagnosis not present

## 2019-09-04 DIAGNOSIS — I10 Essential (primary) hypertension: Secondary | ICD-10-CM

## 2019-09-04 DIAGNOSIS — R300 Dysuria: Secondary | ICD-10-CM

## 2019-09-04 DIAGNOSIS — N183 Chronic kidney disease, stage 3 unspecified: Secondary | ICD-10-CM

## 2019-09-04 NOTE — Progress Notes (Signed)
[facility] [place], [place] [address]  Internal MEDICINE  Office Visit Note  Patient Name: Frances Greene  [id number]  [id number]  Date of Service: 09/05/2019  Chief Complaint  Patient presents with  . Medicare Wellness  . Diabetes  . Hypertension  . Sleep Apnea  . Chronic Kidney Disease  . Quality Metric Gaps    HepC, TDAP, colonoscopy, foot exam     HPI Pt is here for routine health maintenance examination. Overall she reports she has been doing well.  Her A1C was checked in June, stable at that time. Continues to routinely check her blood sugar levels at home. Has chronic kidney disease, followed closely by nephrology. Kidney function remains stable at this time. Has follow-up appointment with nephrology in the next month or two. Lab results does reveal anemia of chronic disease secondary to her kidney disease. Will follow up with anemia panel and monitor her levels. Will treat accordingly. At this time she is due for repeat screening colonoscopy. She is hesistant about repeating the prep process again. Discussed with her the option of Colo guard and she would like to proceed with this at this time Wears CPAP nightly for OSA BP slightly elevated today, improved on repeat manual check. Diabetic foot exam completed today.  Current Medication: Outpatient Encounter Medications as of 09/04/2019  Medication Sig Note  . Alcohol Swabs (B-D SINGLE USE SWABS REGULAR) PADS Use as directed twice daily DX E11.65   . allopurinol (ZYLOPRIM) 300 MG tablet Take 1 tablet (300 mg total) by mouth daily.   Marland Kitchen aspirin EC 325 MG tablet Take 325 mg by mouth. 01/23/2016: Received from: [place]: Take 325 mg by mouth.  Marland Kitchen atorvastatin (LIPITOR) 20 MG tablet Take 1 tablet (20 mg total) by mouth daily.   . betamethasone dipropionate 0.05 % cream Apply topically 2 (two) times daily.   . bisoprolol-hydrochlorothiazide (ZIAC) 2.5-6.25 MG tablet Take 1 tablet  by mouth twice daily   . Blood Glucose Calibration (TRUE METRIX LEVEL 1) Low SOLN Use as directed   . Blood Glucose Monitoring Suppl (TRUE METRIX AIR GLUCOSE METER) w/Device KIT Use as directed to check glucose DX E11.65   . Cholecalciferol (D3-1000) 1000 units tablet Take 1,000 Units by mouth daily.   . cinacalcet (SENSIPAR) 30 MG tablet    . citalopram (CELEXA) 20 MG tablet TAKE 1 TABLET BY MOUTH ONCE DAILY FOR DEPRESSION   . diphenhydrAMINE (ALLERGY) 25 MG tablet Take 25 mg by mouth daily.   . Ferrous Sulfate 134 MG TABS Take 1 tablet by mouth daily.   . folic acid (FOLVITE) 428 MCG tablet Take 400 mcg by mouth daily.   . furosemide (LASIX) 40 MG tablet TAKE 1 TABLET BY MOUTH ONCE DAILY AND  TAKE  XTRA  TAB  IF  NEEDED   . Garlic 7681 MG CAPS Take by mouth. 01/23/2016: Received from: [place]: Take by mouth.  Marland Kitchen glimepiride (AMARYL) 1 MG tablet Take 1 tablet by mouth once daily   . glucose blood (TRUE METRIX BLOOD GLUCOSE TEST) test strip 1 each by Other route in the morning and at bedtime. Dx E11.65   . hydrALAZINE (APRESOLINE) 25 MG tablet TAKE 1 TABLET BY MOUTH THREE TIMES DAILY FOR BLOOD PRESSURE   . HYDROcodone-acetaminophen (NORCO/VICODIN) 5-325 MG tablet Take 1 tablet by mouth every 6 (six) hours as needed for moderate pain or severe pain.   . Insulin Pen Needle (PEN NEEDLES)  32G X 4 MM MISC 1 Units by Does not apply route daily. Use with victoza injections daily.   Marland Kitchen ketoconazole (NIZORAL) 2 % shampoo SHAMPOO AS DIRECTED   . linaclotide (LINZESS) 145 MCG CAPS capsule Take 1 capsule (145 mcg total) by mouth daily before breakfast.   . liraglutide (VICTOZA) 18 MG/3ML SOPN INJECT 1.8 MG ONCE DAILY SUBCUTANEOUSLY   . losartan (COZAAR) 50 MG tablet Take 0.5 tablets (25 mg total) by mouth daily. Take 1/2 tab   . meloxicam (MOBIC) 7.5 MG tablet TAKE 1 TABLET BY MOUTH ONCE DAILY FOR  GOUT  AND  ARTHRITIS   . methylPREDNISolone (MEDROL) 4 MG TBPK tablet Take by  mouth as directed for 6 days   . montelukast (SINGULAIR) 10 MG tablet Take 1 tablet (10 mg total) by mouth daily.   . Omega-3 Fatty Acids (FISH OIL PO) Take by mouth. 01/23/2016: Received from: [place]: Take 1 capsule by mouth 2 (two) times daily.  . polyethylene glycol (MIRALAX / GLYCOLAX) packet Take 17 g by mouth daily as needed.   . senna (SENOKOT) 8.6 MG TABS tablet Take 1 tablet by mouth.   . Sulfacetamide Sodium (SODIUM SULFACETAMIDE) 10 % SHAM Apply topically.   Marland Kitchen tiZANidine (ZANAFLEX) 2 MG tablet Take 1 tablet (2 mg total) by mouth every 8 (eight) hours as needed for muscle spasms.   . TRUEplus Lancets 33G MISC 1 each by Does not apply route in the morning and at bedtime. DX E11.65   . Vitamin D, Ergocalciferol, (DRISDOL) 50000 units CAPS capsule  01/23/2016: Received from: [place]   No facility-administered encounter medications on file as of 09/04/2019.    Surgical History: Past Surgical History:  Procedure Laterality Date  . ABDOMINAL HYSTERECTOMY    . BREAST BIOPSY Left 1989   neg  . CATARACT EXTRACTION W/PHACO Right 05/21/2015   Procedure: CATARACT EXTRACTION PHACO AND INTRAOCULAR LENS PLACEMENT (IOC);  Surgeon: Milus Height, MD;  Location: [facility] ORS;  Service: Ophthalmology;  Laterality: Right;  [place]: AP%: 10.0 CDE: 9.53  . COLONOSCOPY    . JOINT REPLACEMENT Bilateral    knee  . PARS PLANA VITRECTOMY Right 05/21/2015   Procedure: PARS PLANA VITRECTOMY WITH 25 GAUGE;  Surgeon: Milus Height, MD;  Location: [facility] ORS;  Service: Ophthalmology;  Laterality: Right;  Lot # [id number] Endo laser: Watts: 300 Pulse Duration: 200 Total Pulses:143   . shoulder surgery      Medical History: Past Medical History:  Diagnosis Date  . Arthritis   . Bronchitis   . CHF (congestive heart failure) ([place])   . Chronic kidney disease    decreased kidney function  . Diabetes mellitus without complication ([place])   . Gout   . Hypertension    . Multiple allergies   . Shortness of breath dyspnea    w/ exertion  . Sleep apnea    CPAP  . Swelling of both lower extremities   . Wheezing     Family History: Family History  Problem Relation Age of Onset  . Breast cancer Cousin   . Breast cancer Other     Review of Systems  Constitutional: Negative for chills, diaphoresis and fatigue.  HENT: Negative for postnasal drip, sinus pressure, sore throat and trouble swallowing.   Eyes: Negative for photophobia and visual disturbance.  Respiratory: Negative for cough, chest tightness, shortness of breath and wheezing.   Cardiovascular: Negative for chest pain, palpitations and leg swelling.  Gastrointestinal: Negative for abdominal  pain, constipation, diarrhea, nausea and vomiting.  Genitourinary: Negative for dysuria and flank pain.  Musculoskeletal: Positive for arthralgias. Negative for back pain, gait problem, myalgias and neck pain.  Skin: Negative for color change.  Allergic/Immunologic: Negative for environmental allergies and food allergies.  Neurological: Negative for dizziness, tremors, weakness, light-headedness and headaches.  Hematological: Does not bruise/bleed easily.  Psychiatric/Behavioral: Negative for agitation, behavioral problems (depression) and hallucinations.   Vital Signs: BP (!) 142/60   Pulse 77   Temp 98.2 F (36.8 C)   Resp 16   Ht _0  (1.575 m)   Wt 230 lb 9.6 oz (104.6 kg)   SpO2 97%   BMI 42.18 kg/m    Physical Exam Vitals reviewed.  Constitutional:      Appearance: Normal appearance.  HENT:     Head: Normocephalic.     Nose: Nose normal.     Mouth/Throat:     Mouth: Mucous membranes are moist.     Pharynx: Oropharynx is clear.  Cardiovascular:     Rate and Rhythm: Normal rate and regular rhythm.     Pulses: Normal pulses.     Heart sounds: Normal heart sounds.  Pulmonary:     Effort: Pulmonary effort is normal.     Breath sounds: Normal breath sounds.  Abdominal:      General: Abdomen is flat. Bowel sounds are normal.     Palpations: Abdomen is soft.  Musculoskeletal:        General: Normal range of motion.     Cervical back: Normal range of motion.  Skin:    General: Skin is warm.  Neurological:     General: No focal deficit present.     Mental Status: She is alert and oriented to person, place, and time. Mental status is at baseline.  Psychiatric:        Mood and Affect: Mood normal.        Behavior: Behavior normal.        Thought Content: Thought content normal.    LABS: Recent Results (from the past 2160 hour(s))  POCT HgB A1C     Status: Abnormal   Collection Time: 06/26/19  9:24 AM  Result Value Ref Range   Hemoglobin A1C 5.8 (A) 4.0 - 5.6 %   HbA1c POC (<> result, manual entry)     HbA1c, POC (prediabetic range)     HbA1c, POC (controlled diabetic range)    CBC with Differential/Platelet     Status: Abnormal   Collection Time: 08/24/19  3:02 PM  Result Value Ref Range   WBC 5.5 3.4 - 10.8 x10E3/uL   RBC 3.59 (L) 3.77 - 5.28 x10E6/uL   Hemoglobin 10.1 (L) 11.1 - 15.9 g/dL   Hematocrit 30.2 (L) 34.0 - 46.6 %   MCV 84 79 - 97 fL   MCH 28.1 26.6 - 33.0 pg   MCHC 33.4 31 - 35 g/dL   RDW 13.4 11.7 - 15.4 %   Platelets 142 (L) 150 - 450 x10E3/uL   Neutrophils 70 Not Estab. %   Lymphs 15 Not Estab. %   Monocytes 9 Not Estab. %   Eos 4 Not Estab. %   Basos 1 Not Estab. %   Neutrophils Absolute 3.9 1 - 7 x10E3/uL   Lymphocytes Absolute 0.8 0 - 3 x10E3/uL   Monocytes Absolute 0.5 0 - 0 x10E3/uL   EOS (ABSOLUTE) 0.2 0.0 - 0.4 x10E3/uL   Basophils Absolute 0.0 0 - 0 x10E3/uL   Immature Granulocytes 1  Not Estab. %   Immature Grans (Abs) 0.0 0.0 - 0.1 x10E3/uL  Lipid Panel With LDL/HDL Ratio     Status: None   Collection Time: 08/24/19  3:02 PM  Result Value Ref Range   Cholesterol, Total 129 100 - 199 mg/dL   Triglycerides 84 0 - 149 mg/dL   HDL 54 >39 mg/dL   VLDL Cholesterol Cal 16 5 - 40 mg/dL   LDL Chol Calc (NIH) 59 0 - 99  mg/dL   LDL/HDL Ratio 1.1 0.0 - 3.2 ratio    Comment:                                     LDL/HDL Ratio                                             Men  Women                               1/2 Avg.Risk  1.0    1.5                                   Avg.Risk  3.6    3.2                                2X Avg.Risk  6.2    5.0                                3X Avg.Risk  8.0    6.1   TSH     Status: None   Collection Time: 08/24/19  3:02 PM  Result Value Ref Range   TSH 3.480 0.450 - 4.500 uIU/mL  T4, free     Status: None   Collection Time: 08/24/19  3:02 PM  Result Value Ref Range   Free T4 1.20 0.82 - 1.77 ng/dL  Comprehensive metabolic panel     Status: Abnormal   Collection Time: 08/24/19  3:02 PM  Result Value Ref Range   Glucose 155 (H) 65 - 99 mg/dL   BUN 35 (H) 8 - 27 mg/dL   Creatinine, Ser 1.89 (H) 0.57 - 1.00 mg/dL   GFR calc non Af Amer 26 (L) >59 mL/min/1.73   GFR calc Af Amer 30 (L) >59 mL/min/1.73    Comment: **[facility] currently reports eGFR in compliance with the current**   recommendations of the [employer]. [facility] will   update reporting as new guidelines are published from the NKF-ASN   Task force.    BUN/Creatinine Ratio 19 12 - 28   Sodium 143 134 - 144 mmol/L   Potassium 3.9 3.5 - 5.2 mmol/L   Chloride 102 96 - 106 mmol/L   CO2 25 20 - 29 mmol/L   Calcium 9.3 8.7 - 10.3 mg/dL   Total Protein 6.2 6.0 - 8.5 g/dL   Albumin 4.1 3.7 - 4.7 g/dL   Globulin, Total 2.1 1.5 - 4.5 g/dL   Albumin/Globulin Ratio 2.0 1.2 - 2.2   Bilirubin Total 0.5 0.0 -  1.2 mg/dL   Alkaline Phosphatase 66 48 - 121 IU/L   AST 12 0 - 40 IU/L   ALT 8 0 - 32 IU/L  UA/M w/rflx Culture, Routine     Status: None (Preliminary result)   Collection Time: 09/04/19  1:53 PM   Specimen: Urine   Urine  Result Value Ref Range   Specific Gravity, UA 1.011 1.005 - 1.030   pH, UA 6.5 5.0 - 7.5   Color, UA Yellow Yellow   Appearance Ur Clear Clear   Leukocytes,UA Negative  Negative   Protein,UA Negative Negative/Trace   Glucose, UA Negative Negative   Ketones, UA Negative Negative   RBC, UA Negative Negative   Bilirubin, UA Negative Negative   Urobilinogen, Ur 0.2 0.2 - 1.0 mg/dL   Nitrite, UA Negative Negative   Microscopic Examination Comment     Comment: Microscopic follows if indicated.   Microscopic Examination See below:     Comment: Microscopic was indicated and was performed.   Urinalysis Reflex Comment     Comment: This specimen has reflexed to a Urine Culture.  Microscopic Examination     Status: Abnormal   Collection Time: 09/04/19  1:53 PM   Urine  Result Value Ref Range   WBC, UA 0-5 0 - 5 /hpf   RBC None seen 0 - 2 /hpf   Epithelial Cells (non renal) 0-10 0 - 10 /hpf   Casts None seen None seen /lpf   Bacteria, UA Moderate (A) None seen/Few  Urine Culture, Reflex     Status: None (Preliminary result)   Collection Time: 09/04/19  1:53 PM   Urine  Result Value Ref Range   Urine Culture, Routine WILL FOLLOW     Assessment/Plan: 1. Encounter for general adult medical examination with abnormal findings Well appearing 74 year old. Up to date on PHM. - Microscopic Examination - Urine Culture, Reflex  2. Diastolic dysfunction without heart failure History of diastolic dysfunction. Will review echo for follow-up and progression. - ECHOCARDIOGRAM COMPLETE; Future  3. Anemia of chronic disease Most recent lab values reveal anemia of chronic disease secondary to her chronic kidney failure. Will review anemia panel and adjust plan of care accordingly. - CBC with Differential/Platelet - Vitamin B12 - Folate - Iron and TIBC - Ferritin  4. Stage 3 chronic kidney disease, unspecified whether stage 3a or 3b CKD Followed closely by nephrology. Kidney function has remained stable, continue to follow-up with nephrology.  5. Essential hypertension, benign BP slightly elevated today but was much improved on repeat manual check. Continue with  current treatment at this time. Encourage to continue monitoring BP routinely at home.  6. Dysuria - UA/M w/rflx Culture, Routine  General Counseling: Tomasita verbalizes understanding of the findings of todays visit and agrees with plan of treatment. I have discussed any further diagnostic evaluation that may be needed or ordered today. We also reviewed her medications today. she has been encouraged to call the office with any questions or concerns that should arise related to todays visit.   Orders Placed This Encounter  Procedures  . Microscopic Examination  . Urine Culture, Reflex  . CBC with Differential/Platelet  . Vitamin B12  . Folate  . Iron and TIBC  . Ferritin  . UA/M w/rflx Culture, Routine  . ECHOCARDIOGRAM COMPLETE    Total time spent: 35 Minutes  Time spent includes review of chart, medications, test results, and follow up plan with the patient.   This patient was seen by Lovena Le  Jonette Eva Collaboration with Dr Lavera Guise as a part of collaborative care agreement   Tanna Furry. [facility] Internal Medicine

## 2019-09-10 LAB — UA/M W/RFLX CULTURE, ROUTINE
Bilirubin, UA: NEGATIVE
Glucose, UA: NEGATIVE
Ketones, UA: NEGATIVE
Leukocytes,UA: NEGATIVE
Nitrite, UA: NEGATIVE
Protein,UA: NEGATIVE
RBC, UA: NEGATIVE
Specific Gravity, UA: 1.011 (ref 1.005–1.030)
Urobilinogen, Ur: 0.2 mg/dL (ref 0.2–1.0)
pH, UA: 6.5 (ref 5.0–7.5)

## 2019-09-10 LAB — MICROSCOPIC EXAMINATION
Casts: NONE SEEN /lpf
RBC, Urine: NONE SEEN /hpf (ref 0–2)

## 2019-09-10 LAB — URINE CULTURE, REFLEX

## 2019-09-19 ENCOUNTER — Other Ambulatory Visit: Payer: Self-pay | Admitting: Adult Health

## 2019-09-21 ENCOUNTER — Other Ambulatory Visit: Payer: Self-pay

## 2019-09-21 ENCOUNTER — Ambulatory Visit: Payer: Medicare HMO

## 2019-09-21 DIAGNOSIS — I5189 Other ill-defined heart diseases: Secondary | ICD-10-CM

## 2019-09-21 DIAGNOSIS — I5030 Unspecified diastolic (congestive) heart failure: Secondary | ICD-10-CM | POA: Diagnosis not present

## 2019-09-26 LAB — COLOGUARD: COLOGUARD: NEGATIVE

## 2019-09-27 ENCOUNTER — Other Ambulatory Visit: Payer: Self-pay

## 2019-09-27 ENCOUNTER — Other Ambulatory Visit: Payer: Self-pay | Admitting: Internal Medicine

## 2019-09-27 DIAGNOSIS — Z1231 Encounter for screening mammogram for malignant neoplasm of breast: Secondary | ICD-10-CM

## 2019-09-27 DIAGNOSIS — E782 Mixed hyperlipidemia: Secondary | ICD-10-CM

## 2019-09-27 DIAGNOSIS — I10 Essential (primary) hypertension: Secondary | ICD-10-CM

## 2019-09-27 DIAGNOSIS — J309 Allergic rhinitis, unspecified: Secondary | ICD-10-CM

## 2019-09-27 MED ORDER — GLIMEPIRIDE 1 MG PO TABS
1.0000 mg | ORAL_TABLET | Freq: Every day | ORAL | 1 refills | Status: DC
Start: 2019-09-27 — End: 2021-03-18

## 2019-09-27 MED ORDER — FUROSEMIDE 40 MG PO TABS: ORAL_TABLET | ORAL | 1 refills | Status: DC

## 2019-09-27 MED ORDER — BISOPROLOL-HYDROCHLOROTHIAZIDE 2.5-6.25 MG PO TABS: 1.0000 | ORAL_TABLET | Freq: Two times a day (BID) | ORAL | 1 refills | Status: DC

## 2019-09-27 MED ORDER — HYDRALAZINE HCL 25 MG PO TABS: ORAL_TABLET | ORAL | 0 refills | Status: DC

## 2019-09-27 MED ORDER — FOLIC ACID 800 MCG PO TABS: 400.0000 ug | ORAL_TABLET | Freq: Every day | ORAL | 1 refills | Status: DC

## 2019-09-27 MED ORDER — ATORVASTATIN CALCIUM 20 MG PO TABS: 20.0000 mg | ORAL_TABLET | Freq: Every day | ORAL | 1 refills | Status: DC

## 2019-09-27 MED ORDER — ALLOPURINOL 300 MG PO TABS
300.0000 mg | ORAL_TABLET | Freq: Every day | ORAL | 1 refills | Status: DC
Start: 2019-09-27 — End: 2020-06-10

## 2019-09-27 MED ORDER — MONTELUKAST SODIUM 10 MG PO TABS: 10.0000 mg | ORAL_TABLET | Freq: Every day | ORAL | 2 refills | Status: DC

## 2019-09-27 MED ORDER — LOSARTAN POTASSIUM 50 MG PO TABS: 25.0000 mg | ORAL_TABLET | Freq: Every day | ORAL | 1 refills | Status: DC

## 2019-09-28 ENCOUNTER — Other Ambulatory Visit: Payer: Self-pay

## 2019-09-28 MED ORDER — CITALOPRAM HYDROBROMIDE 20 MG PO TABS: ORAL_TABLET | ORAL | 0 refills | Status: DC

## 2019-10-01 NOTE — Progress Notes (Signed)
Reviewed echo, will discuss at next visit

## 2019-10-02 LAB — CBC WITH DIFFERENTIAL/PLATELET
Basophils Absolute: 0 10*3/uL (ref 0.0–0.2)
Basos: 0 %
EOS (ABSOLUTE): 0.3 10*3/uL (ref 0.0–0.4)
Eos: 5 %
Hematocrit: 30.4 % — ABNORMAL LOW (ref 34.0–46.6)
Hemoglobin: 10 g/dL — ABNORMAL LOW (ref 11.1–15.9)
Immature Grans (Abs): 0 10*3/uL (ref 0.0–0.1)
Immature Granulocytes: 1 %
Lymphocytes Absolute: 1.2 10*3/uL (ref 0.7–3.1)
Lymphs: 21 %
MCH: 27.9 pg (ref 26.6–33.0)
MCHC: 32.9 g/dL (ref 31.5–35.7)
MCV: 85 fL (ref 79–97)
Monocytes Absolute: 0.6 10*3/uL (ref 0.1–0.9)
Monocytes: 10 %
Neutrophils Absolute: 3.6 10*3/uL (ref 1.4–7.0)
Neutrophils: 63 %
Platelets: 157 10*3/uL (ref 150–450)
RBC: 3.59 x10E6/uL — ABNORMAL LOW (ref 3.77–5.28)
RDW: 14.1 % (ref 11.7–15.4)
WBC: 5.7 10*3/uL (ref 3.4–10.8)

## 2019-10-02 LAB — IRON AND TIBC
Iron Saturation: 32 % (ref 15–55)
Iron: 73 ug/dL (ref 27–139)
Total Iron Binding Capacity: 227 ug/dL — ABNORMAL LOW (ref 250–450)
UIBC: 154 ug/dL (ref 118–369)

## 2019-10-02 LAB — FERRITIN: Ferritin: 143 ng/mL (ref 15–150)

## 2019-10-02 LAB — FOLATE: Folate: >20 ng/mL (ref >3.0)

## 2019-10-02 LAB — VITAMIN B12: Vitamin B-12: 528 pg/mL (ref 232–1245)

## 2019-10-02 NOTE — Progress Notes (Signed)
Labs are stable, will discuss at next follow-up visit.

## 2019-10-04 ENCOUNTER — Ambulatory Visit
Admission: RE | Admit: 2019-10-04 | Discharge: 2019-10-04 | Disposition: A | Discharge status: Home / Self Care | Payer: Medicare HMO | Source: Ambulatory Visit | Attending: Internal Medicine | Admitting: Internal Medicine

## 2019-10-04 ENCOUNTER — Other Ambulatory Visit: Payer: Self-pay

## 2019-10-04 DIAGNOSIS — Z1231 Encounter for screening mammogram for malignant neoplasm of breast: Secondary | ICD-10-CM | POA: Insufficient documentation

## 2019-10-05 ENCOUNTER — Telehealth: Payer: Self-pay

## 2019-10-05 NOTE — Telephone Encounter (Signed)
Confirmed and screened for 10-09-19 ov. 

## 2019-10-09 ENCOUNTER — Ambulatory Visit (INDEPENDENT_AMBULATORY_CARE_PROVIDER_SITE_OTHER): Payer: Medicare HMO | Admitting: Internal Medicine

## 2019-10-09 ENCOUNTER — Other Ambulatory Visit: Payer: Self-pay

## 2019-10-09 ENCOUNTER — Encounter: Payer: Self-pay | Admitting: Internal Medicine

## 2019-10-09 DIAGNOSIS — E1121 Type 2 diabetes mellitus with diabetic nephropathy: Secondary | ICD-10-CM

## 2019-10-09 DIAGNOSIS — E1122 Type 2 diabetes mellitus with diabetic chronic kidney disease: Secondary | ICD-10-CM

## 2019-10-09 DIAGNOSIS — Z23 Encounter for immunization: Secondary | ICD-10-CM | POA: Diagnosis not present

## 2019-10-09 DIAGNOSIS — N1832 Chronic kidney disease, stage 3b: Secondary | ICD-10-CM | POA: Diagnosis not present

## 2019-10-09 DIAGNOSIS — M79606 Pain in leg, unspecified: Secondary | ICD-10-CM | POA: Diagnosis not present

## 2019-10-09 DIAGNOSIS — E782 Mixed hyperlipidemia: Secondary | ICD-10-CM

## 2019-10-09 LAB — POCT GLYCOSYLATED HEMOGLOBIN (HGB A1C): Hemoglobin A1C: 6.2 % — AB (ref 4.0–5.6)

## 2019-10-09 NOTE — Progress Notes (Signed)
[facility] [place], [place] [address]  Internal MEDICINE  Office Visit Note  Patient Name: Frances Greene  [id number]  [id number]  Date of Service: 10/11/2019  Chief Complaint  Patient presents with  . Follow-up    review labs  . Diabetes  . Hypertension  . Quality Metric Gaps    HepC, TDAP, colonscopy    HPI Pt is here to follow up on her recent labs and Echocardiogram, she has h/o of diastolic dysfunction with sleep apnea, has CPAP machine at home, c/o bilatteral lower ext pain and swelling. She does not diabetic shoes. She has problem walking due to feet swelling and needs better support. Continues to be followed up by nephrology for CKD, hypercalcemia is followed up by Endocrinology.   Current Medication: Outpatient Encounter Medications as of 10/09/2019  Medication Sig Note  . Alcohol Swabs (B-D SINGLE USE SWABS REGULAR) PADS Use as directed twice daily DX E11.65   . allopurinol (ZYLOPRIM) 300 MG tablet Take 1 tablet (300 mg total) by mouth daily.   Marland Kitchen aspirin EC 325 MG tablet Take 325 mg by mouth. 01/23/2016: Received from: [place]: Take 325 mg by mouth.  Marland Kitchen atorvastatin (LIPITOR) 20 MG tablet Take 1 tablet (20 mg total) by mouth daily.   . betamethasone dipropionate 0.05 % cream Apply topically 2 (two) times daily.   . bisoprolol-hydrochlorothiazide (ZIAC) 2.5-6.25 MG tablet Take 1 tablet by mouth 2 (two) times daily.   . Blood Glucose Calibration (TRUE METRIX LEVEL 1) Low SOLN Use as directed   . Blood Glucose Monitoring Suppl (TRUE METRIX AIR GLUCOSE METER) w/Device KIT Use as directed to check glucose DX E11.65   . Cholecalciferol (D3-1000) 1000 units tablet Take 1,000 Units by mouth daily.   . cinacalcet (SENSIPAR) 30 MG tablet    . citalopram (CELEXA) 20 MG tablet TAKE 1 TABLET BY MOUTH ONCE DAILY FOR DEPRESSION   . diphenhydrAMINE (ALLERGY) 25 MG tablet Take 25 mg by mouth daily.   . Ferrous Sulfate 134 MG TABS Take 1  tablet by mouth daily.   . folic acid (FOLVITE) 235 MCG tablet Take 0.5 tablets (400 mcg total) by mouth daily.   . furosemide (LASIX) 40 MG tablet TAKE 1 TABLET BY MOUTH ONCE DAILY AND  TAKE  XTRA  TAB  IF  NEEDED   . Garlic 3614 MG CAPS Take by mouth. 01/23/2016: Received from: [place]: Take by mouth.  Marland Kitchen glimepiride (AMARYL) 1 MG tablet Take 1 tablet (1 mg total) by mouth daily.   Marland Kitchen glucose blood (TRUE METRIX BLOOD GLUCOSE TEST) test strip 1 each by Other route in the morning and at bedtime. Dx E11.65   . hydrALAZINE (APRESOLINE) 25 MG tablet Take 1 tab po three times day   . HYDROcodone-acetaminophen (NORCO/VICODIN) 5-325 MG tablet Take 1 tablet by mouth every 6 (six) hours as needed for moderate pain or severe pain.   . Insulin Pen Needle (PEN NEEDLES) 32G X 4 MM MISC 1 Units by Does not apply route daily. Use with victoza injections daily.   Marland Kitchen ketoconazole (NIZORAL) 2 % shampoo SHAMPOO AS DIRECTED   . linaclotide (LINZESS) 145 MCG CAPS capsule Take 1 capsule (145 mcg total) by mouth daily before breakfast.   . liraglutide (VICTOZA) 18 MG/3ML SOPN INJECT 1.8 MG ONCE DAILY SUBCUTANEOUSLY   . losartan (COZAAR) 50 MG tablet Take 0.5 tablets (25 mg total) by mouth daily. Take 1/2 tab   .  meloxicam (MOBIC) 7.5 MG tablet TAKE 1 TABLET BY MOUTH ONCE DAILY FOR  GOUT  AND  ARTHRITIS   . methylPREDNISolone (MEDROL) 4 MG TBPK tablet Take by mouth as directed for 6 days   . montelukast (SINGULAIR) 10 MG tablet Take 1 tablet (10 mg total) by mouth daily.   . Omega-3 Fatty Acids (FISH OIL PO) Take by mouth. 01/23/2016: Received from: [place]: Take 1 capsule by mouth 2 (two) times daily.  . polyethylene glycol (MIRALAX / GLYCOLAX) packet Take 17 g by mouth daily as needed.   . senna (SENOKOT) 8.6 MG TABS tablet Take 1 tablet by mouth.   . Sulfacetamide Sodium (SODIUM SULFACETAMIDE) 10 % SHAM Apply topically.   Marland Kitchen tiZANidine (ZANAFLEX) 2 MG  tablet Take 1 tablet (2 mg total) by mouth every 8 (eight) hours as needed for muscle spasms.   . TRUEplus Lancets 33G MISC 1 each by Does not apply route in the morning and at bedtime. DX E11.65   . Vitamin D, Ergocalciferol, (DRISDOL) 50000 units CAPS capsule  01/23/2016: Received from: [place]   No facility-administered encounter medications on file as of 10/09/2019.    Surgical History: Past Surgical History:  Procedure Laterality Date  . ABDOMINAL HYSTERECTOMY    . BREAST BIOPSY Left 1989   neg  . CATARACT EXTRACTION W/PHACO Right 05/21/2015   Procedure: CATARACT EXTRACTION PHACO AND INTRAOCULAR LENS PLACEMENT (IOC);  Surgeon: Milus Height, MD;  Location: [facility] ORS;  Service: Ophthalmology;  Laterality: Right;  [place]: AP%: 10.0 CDE: 9.53  . COLONOSCOPY    . JOINT REPLACEMENT Bilateral    knee  . PARS PLANA VITRECTOMY Right 05/21/2015   Procedure: PARS PLANA VITRECTOMY WITH 25 GAUGE;  Surgeon: Milus Height, MD;  Location: [facility] ORS;  Service: Ophthalmology;  Laterality: Right;  Lot # [id number] H Endo laser: [place]: 300 Pulse Duration: 200 Total Pulses:143   . shoulder surgery      Medical History: Past Medical History:  Diagnosis Date  . Arthritis   . Bronchitis   . CHF (congestive heart failure) ([place])   . Chronic kidney disease    decreased kidney function  . Diabetes mellitus without complication ([place])   . Gout   . Hypertension   . Multiple allergies   . Shortness of breath dyspnea    w/ exertion  . Sleep apnea    CPAP  . Swelling of both lower extremities   . Wheezing     Family History: Family History  Problem Relation Age of Onset  . Breast cancer Cousin   . Breast cancer Other     Social History   Socioeconomic History  . Marital status: Divorced    Spouse name: Not on file  . Number of children: Not on file  . Years of education: Not on file  . Highest education level: Not on file  Occupational History  . Not on file  Tobacco  Use  . Smoking status: Former Smoker    Types: Cigarettes  . Smokeless tobacco: Never Used  . Tobacco comment: 4 cigarettes quit 20 years ago  Vaping Use  . Vaping Use: Never used  Substance and Sexual Activity  . Alcohol use: No  . Drug use: Never  . Sexual activity: Not on file  Other Topics Concern  . Not on file  Social History Narrative  . Not on file   Social Determinants of Health   Financial Resource Strain:   . Difficulty of Paying  Living Expenses: Not on file  Food Insecurity:   . Worried About Charity fundraiser in the Last Year: Not on file  . Ran Out of Food in the Last Year: Not on file  Transportation Needs:   . Lack of Transportation (Medical): Not on file  . Lack of Transportation (Non-Medical): Not on file  Physical Activity:   . Days of Exercise per Week: Not on file  . Minutes of Exercise per Session: Not on file  Stress:   . Feeling of Stress : Not on file  Social Connections:   . Frequency of Communication with Friends and Family: Not on file  . Frequency of Social Gatherings with Friends and Family: Not on file  . Attends Religious Services: Not on file  . Active Member of Clubs or Organizations: Not on file  . Attends Archivist Meetings: Not on file  . Marital Status: Not on file  Intimate Partner Violence:   . Fear of Current or Ex-Partner: Not on file  . Emotionally Abused: Not on file  . Physically Abused: Not on file  . Sexually Abused: Not on file      Review of Systems  Constitutional: Negative for chills, diaphoresis and fatigue.  HENT: Negative for ear pain, postnasal drip and sinus pressure.   Eyes: Negative for photophobia, discharge, redness, itching and visual disturbance.  Respiratory: Negative for cough, shortness of breath and wheezing.   Cardiovascular: Negative for chest pain, palpitations and leg swelling.  Gastrointestinal: Negative for abdominal pain, constipation, diarrhea, nausea and vomiting.   Genitourinary: Negative for dysuria and flank pain.  Musculoskeletal: Positive for arthralgias and gait problem. Negative for back pain and neck pain.  Skin: Negative for color change.  Allergic/Immunologic: Negative for environmental allergies and food allergies.  Neurological: Positive for numbness. Negative for dizziness and headaches.  Hematological: Does not bruise/bleed easily.  Psychiatric/Behavioral: Negative for agitation, behavioral problems (depression) and hallucinations.    Vital Signs: BP (!) 130/58   Pulse 69   Temp (!) 97.2 F (36.2 C)   Resp 16   Ht $R'5\' 3"'nk$  (1.6 m)   Wt 235 lb 3.2 oz (106.7 kg)   SpO2 97%   BMI 41.66 kg/m    Physical Exam Constitutional:      General: She is not in acute distress.    Appearance: She is well-developed. She is not diaphoretic.  HENT:     Head: Normocephalic and atraumatic.     Mouth/Throat:     Pharynx: No oropharyngeal exudate.  Eyes:     Pupils: Pupils are equal, round, and reactive to light.  Neck:     Thyroid: No thyromegaly.     Vascular: No JVD.     Trachea: No tracheal deviation.  Cardiovascular:     Rate and Rhythm: Normal rate and regular rhythm.     Pulses:          Dorsalis pedis pulses are 2+ on the right side and 2+ on the left side.       Posterior tibial pulses are 3+ on the right side and 3+ on the left side.     Heart sounds: Normal heart sounds. No murmur heard.  No friction rub. No gallop.   Pulmonary:     Effort: Pulmonary effort is normal. No respiratory distress.     Breath sounds: No wheezing or rales.  Chest:     Chest wall: No tenderness.  Abdominal:     General: Bowel sounds are normal.  Palpations: Abdomen is soft.  Musculoskeletal:        General: Normal range of motion.     Cervical back: Normal range of motion and neck supple.     Right foot: Normal range of motion.     Left foot: Normal range of motion.  Feet:     Right foot:     Protective Sensation: 2 sites tested. 2 sites  sensed.     Skin integrity: Erythema and callus present. No warmth.     Toenail Condition: Right toenails are normal.     Left foot:     Protective Sensation: 2 sites tested. 2 sites sensed.     Skin integrity: Erythema and callus present. No warmth. Left foot blister: bilateral edema      Toenail Condition: Left toenails are normal.  Lymphadenopathy:     Cervical: No cervical adenopathy.  Skin:    General: Skin is warm and dry.  Neurological:     Mental Status: She is alert and oriented to person, place, and time.     Cranial Nerves: No cranial nerve deficit.  Psychiatric:        Behavior: Behavior normal.        Thought Content: Thought content normal.        Judgment: Judgment normal.        Assessment/Plan: 1. Type 2 diabetes mellitus with stage 3b chronic kidney disease, without long-term current use of insulin (HCC) Excellent diabetic control (6.2) continue all medications  - POCT HgB A1C  2. Pain of lower extremity, unspecified laterality Echo shows diastolic dysfunction  - POCT ABI Screening Pilot No Charge  Pt was here was ABI Right Brachial 150 and Right ankle 170 The left Brachial 114 and left ankle 166 and final calculation Right leg 1.13 and Left Leg 1.10. pt will need non invasive arterial studies on next visit  Diabetic shoes order is given due to her numbness and foot deformity   3. Mixed hyperlipidemia Controlled   4. Flu vaccine need - Flu Vaccine MDCK QUAD PF  5. Stage 3b chronic kidney disease Per Nephrology   General Counseling: Carloyn Manner understanding of the findings of todays visit and agrees with plan of treatment. I have discussed any further diagnostic evaluation that may be needed or ordered today. We also reviewed her medications today. she has been encouraged to call the office with any questions or concerns that should arise related to todays visit.    Orders Placed This Encounter  Procedures  . For Home Use Only DME Diabetic  Shoe  . Flu Vaccine MDCK QUAD PF  . POCT HgB A1C  . POCT ABI Screening Pilot No Charge    No orders of the defined types were placed in this encounter.   Total time spent: 35 Minutes Time spent includes review of chart, medications, test results, and follow up plan with the patient.      Dr Lavera Guise Internal medicine

## 2019-10-12 ENCOUNTER — Telehealth: Payer: Self-pay

## 2019-10-12 NOTE — Telephone Encounter (Signed)
Faxed diabetic shoe order to [facility]

## 2019-10-22 ENCOUNTER — Telehealth: Payer: Self-pay

## 2019-10-22 NOTE — Telephone Encounter (Signed)
Spoke with pt and [facility]  That diabetic shoes is ready pickup

## 2019-10-23 ENCOUNTER — Encounter: Payer: Self-pay | Admitting: Internal Medicine

## 2019-10-23 ENCOUNTER — Other Ambulatory Visit: Payer: Self-pay

## 2019-10-23 ENCOUNTER — Ambulatory Visit (INDEPENDENT_AMBULATORY_CARE_PROVIDER_SITE_OTHER): Payer: Medicare HMO | Admitting: Internal Medicine

## 2019-10-23 DIAGNOSIS — J309 Allergic rhinitis, unspecified: Secondary | ICD-10-CM

## 2019-10-23 DIAGNOSIS — G4733 Obstructive sleep apnea (adult) (pediatric): Secondary | ICD-10-CM | POA: Diagnosis not present

## 2019-10-23 DIAGNOSIS — Z7189 Other specified counseling: Secondary | ICD-10-CM | POA: Diagnosis not present

## 2019-10-23 NOTE — Progress Notes (Signed)
[facility] [place], [place] [address]  Pulmonary Sleep Medicine   Office Visit Note  Patient Name: Frances Greene DOB: [birth year] MRN [id number]  Date of Service: 10/29/2019  Complaints/HPI: Patient is here for routine pulmonary follow-up Followed for OSA, wears CPAP nightly, has not had a recent download She says she wears her CPAP most nights, through the night with no complications Denies congestion, dryness, headaches on awakening and feels rested throughout the day Cleans her machine by hand and changes out her supplies as appropriate   ROS  General: (-) fever, (-) chills, (-) night sweats, (-) weakness Skin: (-) rashes, (-) itching,. Eyes: (-) visual changes, (-) redness, (-) itching. Nose and Sinuses: (-) nasal stuffiness or itchiness, (-) postnasal drip, (-) nosebleeds, (-) sinus trouble. Mouth and Throat: (-) sore throat, (-) hoarseness. Neck: (-) swollen glands, (-) enlarged thyroid, (-) neck pain. Respiratory: - cough, (-) bloody sputum, - shortness of breath, - wheezing. Cardiovascular: - ankle swelling, (-) chest pain. Lymphatic: (-) lymph node enlargement. Neurologic: (-) numbness, (-) tingling. Psychiatric: (-) anxiety, (-) depression   Current Medication: Outpatient Encounter Medications as of 10/23/2019  Medication Sig Note  . Alcohol Swabs (B-D SINGLE USE SWABS REGULAR) PADS Use as directed twice daily DX E11.65   . allopurinol (ZYLOPRIM) 300 MG tablet Take 1 tablet (300 mg total) by mouth daily.   Marland Kitchen aspirin EC 325 MG tablet Take 325 mg by mouth. 01/23/2016: Received from: [place]: Take 325 mg by mouth.  Marland Kitchen atorvastatin (LIPITOR) 20 MG tablet Take 1 tablet (20 mg total) by mouth daily.   . betamethasone dipropionate 0.05 % cream Apply topically 2 (two) times daily.   . bisoprolol-hydrochlorothiazide (ZIAC) 2.5-6.25 MG tablet Take 1 tablet by mouth 2 (two) times daily.   . Blood Glucose Calibration (TRUE  METRIX LEVEL 1) Low SOLN Use as directed   . Blood Glucose Monitoring Suppl (TRUE METRIX AIR GLUCOSE METER) w/Device KIT Use as directed to check glucose DX E11.65   . Cholecalciferol (D3-1000) 1000 units tablet Take 1,000 Units by mouth daily.   . cinacalcet (SENSIPAR) 30 MG tablet    . citalopram (CELEXA) 20 MG tablet TAKE 1 TABLET BY MOUTH ONCE DAILY FOR DEPRESSION   . diphenhydrAMINE (ALLERGY) 25 MG tablet Take 25 mg by mouth daily.   . Ferrous Sulfate 134 MG TABS Take 1 tablet by mouth daily.   . folic acid (FOLVITE) 242 MCG tablet Take 0.5 tablets (400 mcg total) by mouth daily.   . furosemide (LASIX) 40 MG tablet TAKE 1 TABLET BY MOUTH ONCE DAILY AND  TAKE  XTRA  TAB  IF  NEEDED   . Garlic 3536 MG CAPS Take by mouth. 01/23/2016: Received from: [place]: Take by mouth.  Marland Kitchen glimepiride (AMARYL) 1 MG tablet Take 1 tablet (1 mg total) by mouth daily.   Marland Kitchen glucose blood (TRUE METRIX BLOOD GLUCOSE TEST) test strip 1 each by Other route in the morning and at bedtime. Dx E11.65   . hydrALAZINE (APRESOLINE) 25 MG tablet Take 1 tab po three times day   . HYDROcodone-acetaminophen (NORCO/VICODIN) 5-325 MG tablet Take 1 tablet by mouth every 6 (six) hours as needed for moderate pain or severe pain.   . Insulin Pen Needle (PEN NEEDLES) 32G X 4 MM MISC 1 Units by Does not apply route daily. Use with victoza injections daily.   Marland Kitchen ketoconazole (NIZORAL) 2 % shampoo SHAMPOO AS DIRECTED   .  linaclotide (LINZESS) 145 MCG CAPS capsule Take 1 capsule (145 mcg total) by mouth daily before breakfast.   . liraglutide (VICTOZA) 18 MG/3ML SOPN INJECT 1.8 MG ONCE DAILY SUBCUTANEOUSLY   . losartan (COZAAR) 50 MG tablet Take 0.5 tablets (25 mg total) by mouth daily. Take 1/2 tab   . meloxicam (MOBIC) 7.5 MG tablet TAKE 1 TABLET BY MOUTH ONCE DAILY FOR  GOUT  AND  ARTHRITIS   . montelukast (SINGULAIR) 10 MG tablet Take 1 tablet (10 mg total) by mouth daily.   . Omega-3 Fatty Acids (FISH  OIL PO) Take by mouth. 01/23/2016: Received from: [place]: Take 1 capsule by mouth 2 (two) times daily.  . polyethylene glycol (MIRALAX / GLYCOLAX) packet Take 17 g by mouth daily as needed.   . senna (SENOKOT) 8.6 MG TABS tablet Take 1 tablet by mouth.   . Sulfacetamide Sodium (SODIUM SULFACETAMIDE) 10 % SHAM Apply topically.   Marland Kitchen tiZANidine (ZANAFLEX) 2 MG tablet Take 1 tablet (2 mg total) by mouth every 8 (eight) hours as needed for muscle spasms.   . TRUEplus Lancets 33G MISC 1 each by Does not apply route in the morning and at bedtime. DX E11.65   . Vitamin D, Ergocalciferol, (DRISDOL) 50000 units CAPS capsule  01/23/2016: Received from: [facility]  . [DISCONTINUED] methylPREDNISolone (MEDROL) 4 MG TBPK tablet Take by mouth as directed for 6 days (Patient not taking: Reported on 10/23/2019)    No facility-administered encounter medications on file as of 10/23/2019.    Surgical History: Past Surgical History:  Procedure Laterality Date  . ABDOMINAL HYSTERECTOMY    . BREAST BIOPSY Left 1989   neg  . CATARACT EXTRACTION W/PHACO Right 05/21/2015   Procedure: CATARACT EXTRACTION PHACO AND INTRAOCULAR LENS PLACEMENT (IOC);  Surgeon: Milus Height, MD;  Location: [facility] ORS;  Service: Ophthalmology;  Laterality: Right;  [place]: AP%: 10.0 CDE: 9.53  . COLONOSCOPY    . JOINT REPLACEMENT Bilateral    knee  . PARS PLANA VITRECTOMY Right 05/21/2015   Procedure: PARS PLANA VITRECTOMY WITH 25 GAUGE;  Surgeon: Milus Height, MD;  Location: [facility] ORS;  Service: Ophthalmology;  Laterality: Right;  Lot # [id number] H Endo laser: Watts: 300 Pulse Duration: 200 Total Pulses:143   . shoulder surgery      Medical History: Past Medical History:  Diagnosis Date  . Arthritis   . Bronchitis   . CHF (congestive heart failure) ([place])   . Chronic kidney disease    decreased kidney function  . Diabetes mellitus without complication ([place])   . Gout   . Hypertension    . Multiple allergies   . Shortness of breath dyspnea    w/ exertion  . Sleep apnea    CPAP  . Swelling of both lower extremities   . Wheezing     Family History: Family History  Problem Relation Age of Onset  . Breast cancer Cousin   . Breast cancer Other   . Hypertension Mother   . Diabetes Mother   . Hypertension Father   . Diabetes Father   . Alcohol abuse Father   . Aneurysm Sister   . Alcohol abuse Sister   . Stroke Sister        died from stroke  . Hepatitis Brother   . Hypertension Sister        dialysis  . Diabetes Sister   . Alcoholism Brother   . Alcoholism Brother  dies from head injury from a fall.  not an alcoholic,  . Cancer Brother   . Diabetes Brother   . Hypertension Brother   . Diabetes Brother   . Cancer Brother   . Hypertension Brother   . Stroke Brother   . Alcoholism Brother     Social History: Social History   Socioeconomic History  . Marital status: Divorced    Spouse name: Not on file  . Number of children: Not on file  . Years of education: Not on file  . Highest education level: Not on file  Occupational History  . Not on file  Tobacco Use  . Smoking status: Former Smoker    Types: Cigarettes  . Smokeless tobacco: Never Used  . Tobacco comment: 4 cigarettes quit 20 years ago  Vaping Use  . Vaping Use: Never used  Substance and Sexual Activity  . Alcohol use: No  . Drug use: Never  . Sexual activity: Not on file  Other Topics Concern  . Not on file  Social History Narrative  . Not on file   Social Determinants of Health   Financial Resource Strain:   . Difficulty of Paying Living Expenses: Not on file  Food Insecurity:   . Worried About Charity fundraiser in the Last Year: Not on file  . Ran Out of Food in the Last Year: Not on file  Transportation Needs:   . Lack of Transportation (Medical): Not on file  . Lack of Transportation (Non-Medical): Not on file  Physical Activity:   . Days of Exercise per  Week: Not on file  . Minutes of Exercise per Session: Not on file  Stress:   . Feeling of Stress : Not on file  Social Connections:   . Frequency of Communication with Friends and Family: Not on file  . Frequency of Social Gatherings with Friends and Family: Not on file  . Attends Religious Services: Not on file  . Active Member of Clubs or Organizations: Not on file  . Attends Archivist Meetings: Not on file  . Marital Status: Not on file  Intimate Partner Violence:   . Fear of Current or Ex-Partner: Not on file  . Emotionally Abused: Not on file  . Physically Abused: Not on file  . Sexually Abused: Not on file    Vital Signs: Blood pressure (!) 120/50, pulse 71, resp. rate 16, height $RemoveBe'5\' 3"'[id number]$  (1.6 m), weight 231 lb 12.8 oz (105.1 kg), SpO2 95 %.  Examination: General Appearance: The patient is well-developed, well-nourished, and in no distress. Skin: Gross inspection of skin unremarkable. Head: normocephalic, no gross deformities. Eyes: no gross deformities noted. ENT: ears appear grossly normal no exudates. Neck: Supple. No thyromegaly. No LAD. Respiratory: Clear throughout, no rhonchi or wheezing noted. Cardiovascular: Normal S1 and S2 without murmur or rub. Extremities: No cyanosis. pulses are equal. Neurologic: Alert and oriented. No involuntary movements.  LABS: Recent Results (from the past 2160 hour(s))  CBC with Differential/Platelet     Status: Abnormal   Collection Time: 08/24/19  3:02 PM  Result Value Ref Range   WBC 5.5 3.4 - 10.8 x10E3/uL   RBC 3.59 (L) 3.77 - 5.28 x10E6/uL   Hemoglobin 10.1 (L) 11.1 - 15.9 g/dL   Hematocrit 30.2 (L) 34.0 - 46.6 %   MCV 84 79 - 97 fL   MCH 28.1 26.6 - 33.0 pg   MCHC 33.4 31 - 35 g/dL   RDW 13.4 11.7 - 15.4 %  Platelets 142 (L) 150 - 450 x10E3/uL   Neutrophils 70 Not Estab. %   Lymphs 15 Not Estab. %   Monocytes 9 Not Estab. %   Eos 4 Not Estab. %   Basos 1 Not Estab. %   Neutrophils Absolute 3.9 1 - 7  x10E3/uL   Lymphocytes Absolute 0.8 0 - 3 x10E3/uL   Monocytes Absolute 0.5 0 - 0 x10E3/uL   EOS (ABSOLUTE) 0.2 0.0 - 0.4 x10E3/uL   Basophils Absolute 0.0 0 - 0 x10E3/uL   Immature Granulocytes 1 Not Estab. %   Immature Grans (Abs) 0.0 0.0 - 0.1 x10E3/uL  Lipid Panel With LDL/HDL Ratio     Status: None   Collection Time: 08/24/19  3:02 PM  Result Value Ref Range   Cholesterol, Total 129 100 - 199 mg/dL   Triglycerides 84 0 - 149 mg/dL   HDL 54 >39 mg/dL   VLDL Cholesterol Cal 16 5 - 40 mg/dL   LDL Chol Calc (NIH) 59 0 - 99 mg/dL   LDL/HDL Ratio 1.1 0.0 - 3.2 ratio    Comment:                                     LDL/HDL Ratio                                             Men  Women                               1/2 Avg.Risk  1.0    1.5                                   Avg.Risk  3.6    3.2                                2X Avg.Risk  6.2    5.0                                3X Avg.Risk  8.0    6.1   TSH     Status: None   Collection Time: 08/24/19  3:02 PM  Result Value Ref Range   TSH 3.480 0.450 - 4.500 uIU/mL  T4, free     Status: None   Collection Time: 08/24/19  3:02 PM  Result Value Ref Range   Free T4 1.20 0.82 - 1.77 ng/dL  Comprehensive metabolic panel     Status: Abnormal   Collection Time: 08/24/19  3:02 PM  Result Value Ref Range   Glucose 155 (H) 65 - 99 mg/dL   BUN 35 (H) 8 - 27 mg/dL   Creatinine, Ser 1.89 (H) 0.57 - 1.00 mg/dL   GFR calc non Af Amer 26 (L) >59 mL/min/1.73   GFR calc Af Amer 30 (L) >59 mL/min/1.73    Comment: **[facility] currently reports eGFR in compliance with the current**   recommendations of the [employer]. [facility] will   update reporting as new guidelines are published from the NKF-ASN  Task force.    BUN/Creatinine Ratio 19 12 - 28   Sodium 143 134 - 144 mmol/L   Potassium 3.9 3.5 - 5.2 mmol/L   Chloride 102 96 - 106 mmol/L   CO2 25 20 - 29 mmol/L   Calcium 9.3 8.7 - 10.3 mg/dL   Total Protein 6.2 6.0 - 8.5 g/dL    Albumin 4.1 3.7 - 4.7 g/dL   Globulin, Total 2.1 1.5 - 4.5 g/dL   Albumin/Globulin Ratio 2.0 1.2 - 2.2   Bilirubin Total 0.5 0.0 - 1.2 mg/dL   Alkaline Phosphatase 66 48 - 121 IU/L   AST 12 0 - 40 IU/L   ALT 8 0 - 32 IU/L  UA/M w/rflx Culture, Routine     Status: None   Collection Time: 09/04/19  1:53 PM   Specimen: Urine   Urine  Result Value Ref Range   Specific Gravity, UA 1.011 1.005 - 1.030   pH, UA 6.5 5.0 - 7.5   Color, UA Yellow Yellow   Appearance Ur Clear Clear   Leukocytes,UA Negative Negative   Protein,UA Negative Negative/Trace   Glucose, UA Negative Negative   Ketones, UA Negative Negative   RBC, UA Negative Negative   Bilirubin, UA Negative Negative   Urobilinogen, Ur 0.2 0.2 - 1.0 mg/dL   Nitrite, UA Negative Negative   Microscopic Examination Comment     Comment: Microscopic follows if indicated.   Microscopic Examination See below:     Comment: Microscopic was indicated and was performed.   Urinalysis Reflex Comment     Comment: This specimen has reflexed to a Urine Culture.  Microscopic Examination     Status: Abnormal   Collection Time: 09/04/19  1:53 PM   Urine  Result Value Ref Range   WBC, UA 0-5 0 - 5 /hpf   RBC None seen 0 - 2 /hpf   Epithelial Cells (non renal) 0-10 0 - 10 /hpf   Casts None seen None seen /lpf   Bacteria, UA Moderate (A) None seen/Few  Urine Culture, Reflex     Status: None   Collection Time: 09/04/19  1:53 PM   Urine  Result Value Ref Range   Urine Culture, Routine Final report    Organism ID, Bacteria Comment     Comment: Greater than 2 organisms recovered, none predominant. Please submit another sample if clinically indicated. Greater than 100,000 colony forming units per mL   CBC with Differential/Platelet     Status: Abnormal   Collection Time: 10/01/19  5:31 PM  Result Value Ref Range   WBC 5.7 3.4 - 10.8 x10E3/uL   RBC 3.59 (L) 3.77 - 5.28 x10E6/uL   Hemoglobin 10.0 (L) 11.1 - 15.9 g/dL   Hematocrit 26.9 (L) 13.1  - 46.6 %   MCV 85 79 - 97 fL   MCH 27.9 26.6 - 33.0 pg   MCHC 32.9 31 - 35 g/dL   RDW 47.5 29.3 - 97.0 %   Platelets 157 150 - 450 x10E3/uL   Neutrophils 63 Not Estab. %   Lymphs 21 Not Estab. %   Monocytes 10 Not Estab. %   Eos 5 Not Estab. %   Basos 0 Not Estab. %   Neutrophils Absolute 3.6 1 - 7 x10E3/uL   Lymphocytes Absolute 1.2 0 - 3 x10E3/uL   Monocytes Absolute 0.6 0 - 0 x10E3/uL   EOS (ABSOLUTE) 0.3 0.0 - 0.4 x10E3/uL   Basophils Absolute 0.0 0 - 0 x10E3/uL   Immature Granulocytes 1  Not Estab. %   Immature Grans (Abs) 0.0 0.0 - 0.1 x10E3/uL  Vitamin B12     Status: None   Collection Time: 10/01/19  5:31 PM  Result Value Ref Range   Vitamin B-12 528 232 - 1,245 pg/mL  Folate     Status: None   Collection Time: 10/01/19  5:31 PM  Result Value Ref Range   Folate >20.0 >3.0 ng/mL    Comment: A serum folate concentration of less than 3.1 ng/mL is considered to represent clinical deficiency.   Iron and TIBC     Status: Abnormal   Collection Time: 10/01/19  5:31 PM  Result Value Ref Range   Total Iron Binding Capacity 227 (L) 250 - 450 ug/dL   UIBC 154 118 - 369 ug/dL   Iron 73 27 - 139 ug/dL   Iron Saturation 32 15 - 55 %  Ferritin     Status: None   Collection Time: 10/01/19  5:31 PM  Result Value Ref Range   Ferritin 143 15.0 - 150.0 ng/mL  POCT HgB A1C     Status: Abnormal   Collection Time: 10/09/19 10:55 AM  Result Value Ref Range   Hemoglobin A1C 6.2 (A) 4.0 - 5.6 %   HbA1c POC (<> result, manual entry)     HbA1c, POC (prediabetic range)     HbA1c, POC (controlled diabetic range)      Radiology: MM 3D SCREEN BREAST BILATERAL  Result Date: 10/05/2019 CLINICAL DATA:  Screening. EXAM: DIGITAL SCREENING BILATERAL MAMMOGRAM WITH TOMO AND CAD COMPARISON:  Previous exam(s). ACR Breast Density Category b: There are scattered areas of fibroglandular density. FINDINGS: There are no findings suspicious for malignancy. Images were processed with CAD. IMPRESSION: No  mammographic evidence of malignancy. A result letter of this screening mammogram will be mailed directly to the patient. RECOMMENDATION: Screening mammogram in one year. (Code:SM-B-01Y) BI-RADS CATEGORY  1: Negative. Electronically Signed   By: Nolon Nations M.D.   On: 10/05/2019 13:06    No results found.  MM 3D SCREEN BREAST BILATERAL  Result Date: 10/05/2019 CLINICAL DATA:  Screening. EXAM: DIGITAL SCREENING BILATERAL MAMMOGRAM WITH TOMO AND CAD COMPARISON:  Previous exam(s). ACR Breast Density Category b: There are scattered areas of fibroglandular density. FINDINGS: There are no findings suspicious for malignancy. Images were processed with CAD. IMPRESSION: No mammographic evidence of malignancy. A result letter of this screening mammogram will be mailed directly to the patient. RECOMMENDATION: Screening mammogram in one year. (Code:SM-B-01Y) BI-RADS CATEGORY  1: Negative. Electronically Signed   By: Nolon Nations M.D.   On: 10/05/2019 13:06      Assessment and Plan: Patient Active Problem List   Diagnosis Date Noted  . Acute bilateral low back pain with right-sided sciatica 11/12/2018  . Closed fracture of right proximal humerus 11/12/2018  . Type 2 diabetes mellitus with hyperglycemia ([place]) 10/28/2018  . Flu vaccine need 10/28/2018  . Pain due to onychomycosis of toenails of both feet 07/17/2018  . Type 2 diabetes mellitus with vascular disease ([place]) 07/17/2018  . Other fatigue 11/14/2017  . Moderate episode of recurrent major depressive disorder ([place]) 11/14/2017  . Chronic allergic rhinitis 11/14/2017  . Primary insomnia 11/14/2017  . Uncontrolled type 2 diabetes mellitus with hyperglycemia ([place]) 09/18/2017  . Acute pain of right shoulder 09/18/2017  . Type 2 diabetes mellitus with chronic kidney disease ([place]) 01/19/2017  . Anosmia 01/19/2017  . Cervicalgia 01/19/2017  . Vitamin D deficiency 01/19/2017  . Hypoglycemia 01/19/2017  . Obstructive  sleep apnea 01/19/2017  .  Essential hypertension, benign 01/19/2017  . Mixed hyperlipidemia 01/19/2017  . Anemia, unspecified 01/19/2017  . Edema 01/19/2017  . SOB (shortness of breath) 01/19/2017  . Gout 01/19/2017  . Morbid obesity ([place]) 01/19/2017  . Stage 3 chronic kidney disease ([place]) 01/19/2017  . Hypercalcemia 01/19/2017  . Iron deficiency anemia 01/14/2016  . Acute pain of right knee 04/23/2015  . Left wrist pain 08/12/2014  . Status post bilateral unicompartmental knee replacement 11/13/2013  . Arthritis, senescent 08/02/2013    1. Obstructive sleep apnea Continue with CPAP and nightly use Will set up for a download  2. CPAP use counseling Discussed importance of adequate CPAP use as well as proper care and cleaning techniques of machine and all supplies.  3. Chronic allergic rhinitis Symptoms remain stable at this time, no recent issues or complications regarding her allergies  General Counseling: I have discussed the findings of the evaluation and examination with Frances Greene.  I have also discussed any further diagnostic evaluation thatmay be needed or ordered today. Frances Greene verbalizes understanding of the findings of todays visit. We also reviewed her medications today and discussed drug interactions and side effects including but not limited excessive drowsiness and altered mental states. We also discussed that there is always a risk not just to her but also people around her. she has been encouraged to call the office with any questions or concerns that should arise related to todays visit.   Time spent: 25  I have personally obtained a history, examined the patient, evaluated laboratory and imaging results, formulated the assessment and plan and placed orders. This patient was seen by Casey Burkitt AGNP-C in Collaboration with Dr. Devona Konig as a part of collaborative care agreement.    Allyne Gee, MD [facility] Pulmonary and Critical Care Sleep medicine

## 2019-10-23 NOTE — Patient Instructions (Signed)

## 2019-10-29 ENCOUNTER — Encounter: Payer: Self-pay | Admitting: Internal Medicine

## 2019-12-05 ENCOUNTER — Other Ambulatory Visit: Payer: Self-pay

## 2019-12-05 MED ORDER — HYDRALAZINE HCL 25 MG PO TABS: ORAL_TABLET | ORAL | 0 refills | Status: DC

## 2019-12-06 ENCOUNTER — Telehealth: Payer: Self-pay

## 2019-12-06 NOTE — Telephone Encounter (Signed)
Diabetes care plan signed and faxed to [facility] at [phone]. Copy placed in scan.

## 2019-12-19 ENCOUNTER — Other Ambulatory Visit: Payer: Self-pay

## 2019-12-19 MED ORDER — CITALOPRAM HYDROBROMIDE 20 MG PO TABS: ORAL_TABLET | ORAL | 0 refills | Status: DC

## 2020-01-01 ENCOUNTER — Other Ambulatory Visit: Payer: Self-pay

## 2020-01-01 ENCOUNTER — Encounter: Payer: Self-pay | Admitting: Internal Medicine

## 2020-01-01 ENCOUNTER — Ambulatory Visit (INDEPENDENT_AMBULATORY_CARE_PROVIDER_SITE_OTHER): Payer: Medicare HMO | Admitting: Internal Medicine

## 2020-01-01 VITALS — BP 146/66 | HR 71 | Temp 97.9°F | Resp 16 | Ht 63.0 in | Wt 234.8 lb

## 2020-01-01 DIAGNOSIS — E782 Mixed hyperlipidemia: Secondary | ICD-10-CM

## 2020-01-01 DIAGNOSIS — N1832 Chronic kidney disease, stage 3b: Secondary | ICD-10-CM | POA: Diagnosis not present

## 2020-01-01 DIAGNOSIS — G4733 Obstructive sleep apnea (adult) (pediatric): Secondary | ICD-10-CM

## 2020-01-01 DIAGNOSIS — I10 Essential (primary) hypertension: Secondary | ICD-10-CM

## 2020-01-01 DIAGNOSIS — E1122 Type 2 diabetes mellitus with diabetic chronic kidney disease: Secondary | ICD-10-CM

## 2020-01-01 LAB — POCT GLYCOSYLATED HEMOGLOBIN (HGB A1C): Hemoglobin A1C: 6.2 % — AB (ref 4.0–5.6)

## 2020-01-01 LAB — POCT UA - MICROALBUMIN
Albumin/Creatinine Ratio, Urine, POC: 30
Creatinine, POC: 50 mg/dL

## 2020-01-01 MED ORDER — GABAPENTIN 100 MG PO CAPS: ORAL_CAPSULE | ORAL | 3 refills | Status: DC

## 2020-01-01 NOTE — Progress Notes (Signed)
[facility] [place], [place] [address]  Internal MEDICINE  Office Visit Note  Patient Name: Frances Greene  [id number]  [id number]  Date of Service: 01/21/2020  Chief Complaint  Patient presents with  . Follow-up    3 month    HPI Pt is here for routine follow up. Feels well except numbness in her hands, gets worse at night, diabetes and blood pressure is under good control, CKD followed by nephrology. Does not exercise at this time, but lies to stay active.    Current Medication: Outpatient Encounter Medications as of 01/01/2020  Medication Sig Note  . Alcohol Swabs (B-D SINGLE USE SWABS REGULAR) PADS Use as directed twice daily DX E11.65   . allopurinol (ZYLOPRIM) 300 MG tablet Take 1 tablet (300 mg total) by mouth daily.   Marland Kitchen aspirin EC 325 MG tablet Take 325 mg by mouth. 01/23/2016: Received from: [place]: Take 325 mg by mouth.  Marland Kitchen atorvastatin (LIPITOR) 20 MG tablet Take 1 tablet (20 mg total) by mouth daily.   . betamethasone dipropionate 0.05 % cream Apply topically 2 (two) times daily.   . bisoprolol-hydrochlorothiazide (ZIAC) 2.5-6.25 MG tablet Take 1 tablet by mouth 2 (two) times daily.   . Blood Glucose Calibration (TRUE METRIX LEVEL 1) Low SOLN Use as directed   . Blood Glucose Monitoring Suppl (TRUE METRIX AIR GLUCOSE METER) w/Device KIT Use as directed to check glucose DX E11.65   . Cholecalciferol 25 MCG (1000 UT) tablet Take 1,000 Units by mouth daily.   . cinacalcet (SENSIPAR) 30 MG tablet    . citalopram (CELEXA) 20 MG tablet TAKE 1 TABLET BY MOUTH ONCE DAILY FOR DEPRESSION   . diphenhydrAMINE (BENADRYL) 25 MG tablet Take 25 mg by mouth daily.   . Ferrous Sulfate 134 MG TABS Take 1 tablet by mouth daily.   . folic acid (FOLVITE) 427 MCG tablet Take 0.5 tablets (400 mcg total) by mouth daily.   . furosemide (LASIX) 40 MG tablet TAKE 1 TABLET BY MOUTH ONCE DAILY AND  TAKE  XTRA  TAB  IF  NEEDED   . Garlic 0623 MG CAPS  Take by mouth. 01/23/2016: Received from: [place]: Take by mouth.  Marland Kitchen glimepiride (AMARYL) 1 MG tablet Take 1 tablet (1 mg total) by mouth daily.   Marland Kitchen glucose blood (TRUE METRIX BLOOD GLUCOSE TEST) test strip 1 each by Other route in the morning and at bedtime. Dx E11.65   . hydrALAZINE (APRESOLINE) 25 MG tablet Take 1 tab po three times day   . HYDROcodone-acetaminophen (NORCO/VICODIN) 5-325 MG tablet Take 1 tablet by mouth every 6 (six) hours as needed for moderate pain or severe pain.   Marland Kitchen ketoconazole (NIZORAL) 2 % shampoo SHAMPOO AS DIRECTED   . linaclotide (LINZESS) 145 MCG CAPS capsule Take 1 capsule (145 mcg total) by mouth daily before breakfast.   . meloxicam (MOBIC) 7.5 MG tablet TAKE 1 TABLET BY MOUTH ONCE DAILY FOR  GOUT  AND  ARTHRITIS   . montelukast (SINGULAIR) 10 MG tablet Take 1 tablet (10 mg total) by mouth daily.   . Omega-3 Fatty Acids (FISH OIL PO) Take by mouth. 01/23/2016: Received from: [place]: Take 1 capsule by mouth 2 (two) times daily.  . polyethylene glycol (MIRALAX / GLYCOLAX) packet Take 17 g by mouth daily as needed.   . senna (SENOKOT) 8.6 MG TABS tablet Take 1 tablet by mouth.   Marland Kitchen  Sulfacetamide Sodium (SODIUM SULFACETAMIDE) 10 % SHAM Apply topically.   Marland Kitchen tiZANidine (ZANAFLEX) 2 MG tablet Take 1 tablet (2 mg total) by mouth every 8 (eight) hours as needed for muscle spasms.   . Vitamin D, Ergocalciferol, (DRISDOL) 50000 units CAPS capsule  01/23/2016: Received from: [facility]  . [DISCONTINUED] Insulin Pen Needle (PEN NEEDLES) 32G X 4 MM MISC 1 Units by Does not apply route daily. Use with victoza injections daily.   . [DISCONTINUED] liraglutide (VICTOZA) 18 MG/3ML SOPN INJECT 1.8 MG ONCE DAILY SUBCUTANEOUSLY   . [DISCONTINUED] TRUEplus Lancets 33G MISC 1 each by Does not apply route in the morning and at bedtime. DX E11.65   . gabapentin (NEURONTIN) 100 MG capsule Take 2 -4 cap at  night for numbness in hands   . losartan (COZAAR) 50 MG tablet Take 0.5 tablets (25 mg total) by mouth daily. Take 1/2 tab    No facility-administered encounter medications on file as of 01/01/2020.    Surgical History: Past Surgical History:  Procedure Laterality Date  . ABDOMINAL HYSTERECTOMY    . BREAST BIOPSY Left 1989   neg  . CATARACT EXTRACTION W/PHACO Right 05/21/2015   Procedure: CATARACT EXTRACTION PHACO AND INTRAOCULAR LENS PLACEMENT (IOC);  Surgeon: Milus Height, MD;  Location: [facility] ORS;  Service: Ophthalmology;  Laterality: Right;  [place]: AP%: 10.0 CDE: 9.53  . COLONOSCOPY    . JOINT REPLACEMENT Bilateral    knee  . PARS PLANA VITRECTOMY Right 05/21/2015   Procedure: PARS PLANA VITRECTOMY WITH 25 GAUGE;  Surgeon: Milus Height, MD;  Location: [facility] ORS;  Service: Ophthalmology;  Laterality: Right;  Lot # [id number] H Endo laser: [place]: 300 Pulse Duration: 200 Total Pulses:143   . shoulder surgery      Medical History: Past Medical History:  Diagnosis Date  . Arthritis   . Bronchitis   . CHF (congestive heart failure) ([place])   . Chronic kidney disease    decreased kidney function  . Diabetes mellitus without complication ([place])   . Gout   . Hypertension   . Multiple allergies   . Shortness of breath dyspnea    w/ exertion  . Sleep apnea    CPAP  . Swelling of both lower extremities   . Wheezing     Family History: Family History  Problem Relation Age of Onset  . Breast cancer Cousin   . Breast cancer Other   . Hypertension Mother   . Diabetes Mother   . Hypertension Father   . Diabetes Father   . Alcohol abuse Father   . Aneurysm Sister   . Alcohol abuse Sister   . Stroke Sister        died from stroke  . Hepatitis Brother   . Hypertension Sister        dialysis  . Diabetes Sister   . Alcoholism Brother   . Alcoholism Brother        dies from head injury from a fall.  not an alcoholic,  . Cancer Brother   . Diabetes Brother   . Hypertension  Brother   . Diabetes Brother   . Cancer Brother   . Hypertension Brother   . Stroke Brother   . Alcoholism Brother     Social History   Socioeconomic History  . Marital status: Divorced    Spouse name: Not on file  . Number of children: Not on file  . Years of education: Not on file  . Highest education level: Not on  file  Occupational History  . Not on file  Tobacco Use  . Smoking status: Former Smoker    Types: Cigarettes  . Smokeless tobacco: Never Used  . Tobacco comment: 4 cigarettes quit 20 years ago  Vaping Use  . Vaping Use: Never used  Substance and Sexual Activity  . Alcohol use: No  . Drug use: Never  . Sexual activity: Not on file  Other Topics Concern  . Not on file  Social History Narrative  . Not on file   Social Determinants of Health   Financial Resource Strain: Not on file  Food Insecurity: Not on file  Transportation Needs: Not on file  Physical Activity: Not on file  Stress: Not on file  Social Connections: Not on file  Intimate Partner Violence: Not on file      Review of Systems  Constitutional: Negative for chills, diaphoresis and fatigue.  HENT: Negative for ear pain, postnasal drip and sinus pressure.   Eyes: Negative for photophobia, discharge, redness, itching and visual disturbance.  Respiratory: Negative for cough, shortness of breath and wheezing.   Cardiovascular: Negative for chest pain, palpitations and leg swelling.  Gastrointestinal: Negative for abdominal pain, constipation, diarrhea, nausea and vomiting.  Genitourinary: Negative for dysuria and flank pain.  Musculoskeletal: Negative for arthralgias, back pain, gait problem and neck pain.  Skin: Negative for color change.  Allergic/Immunologic: Negative for environmental allergies and food allergies.  Neurological: Negative for dizziness and headaches.  Hematological: Does not bruise/bleed easily.  Psychiatric/Behavioral: Negative for agitation, behavioral problems  (depression) and hallucinations.    Vital Signs: BP (!) 146/66   Pulse 71   Temp 97.9 F (36.6 C)   Resp 16   Ht $R'5\' 3"'CT$  (1.6 m)   Wt 234 lb 12.8 oz (106.5 kg)   SpO2 96%   BMI 41.59 kg/m    Physical Exam Constitutional:      General: She is not in acute distress.    Appearance: She is well-developed and well-nourished. She is not diaphoretic.  HENT:     Head: Normocephalic and atraumatic.     Mouth/Throat:     Mouth: Oropharynx is clear and moist.     Pharynx: No oropharyngeal exudate.  Eyes:     Extraocular Movements: EOM normal.     Pupils: Pupils are equal, round, and reactive to light.  Neck:     Thyroid: No thyromegaly.     Vascular: No JVD.     Trachea: No tracheal deviation.  Cardiovascular:     Rate and Rhythm: Normal rate and regular rhythm.     Heart sounds: Normal heart sounds. No murmur heard. No friction rub. No gallop.   Pulmonary:     Effort: Pulmonary effort is normal. No respiratory distress.     Breath sounds: No wheezing or rales.  Chest:     Chest wall: No tenderness.  Abdominal:     General: Bowel sounds are normal.     Palpations: Abdomen is soft.  Musculoskeletal:        General: Normal range of motion.     Cervical back: Normal range of motion and neck supple.  Lymphadenopathy:     Cervical: No cervical adenopathy.  Skin:    General: Skin is warm and dry.  Neurological:     Mental Status: She is alert and oriented to person, place, and time.     Cranial Nerves: No cranial nerve deficit.  Psychiatric:        Mood and Affect:  Mood and affect normal.        Behavior: Behavior normal.        Thought Content: Thought content normal.        Judgment: Judgment normal.     Assessment/Plan: 1. Type 2 diabetes mellitus with stage 3b chronic kidney disease, without long-term current use of insulin (HCC) Continue Victoza and Glimepiride as before  as before.  - CBC with Differential/Platelet - Comprehensive metabolic panel - POCT  glycosylated hemoglobin (Hb A1C) - POCT UA - Microalbumin  2. Essential hypertension, benign Slightly elevated systolic blood pressure, will monitor  - CBC with Differential/Platelet - Comprehensive metabolic panel  3. Obstructive sleep apnea Compliant with CPAP   4. Mixed hyperlipidemia Lipitor as before   General Counseling: Dannetta verbalizes understanding of the findings of todays visit and agrees with plan of treatment. I have discussed any further diagnostic evaluation that may be needed or ordered today. We also reviewed her medications today. she has been encouraged to call the office with any questions or concerns that should arise related to todays visit.    Orders Placed This Encounter  Procedures  . CBC with Differential/Platelet  . Comprehensive metabolic panel  . POCT glycosylated hemoglobin (Hb A1C)  . POCT UA - Microalbumin    Meds ordered this encounter  Medications  . gabapentin (NEURONTIN) 100 MG capsule    Sig: Take 2 -4 cap at night for numbness in hands    Dispense:  120 capsule    Refill:  3    Total time spent:30 Minutes Time spent includes review of chart, medications, test results, and follow up plan with the patient.      Dr Lavera Guise Internal medicine

## 2020-01-03 ENCOUNTER — Other Ambulatory Visit: Payer: Self-pay | Admitting: Internal Medicine

## 2020-01-16 ENCOUNTER — Other Ambulatory Visit: Payer: Self-pay

## 2020-01-16 MED ORDER — VICTOZA 18 MG/3ML ~~LOC~~ SOPN: PEN_INJECTOR | SUBCUTANEOUS | 3 refills | Status: DC

## 2020-01-20 ENCOUNTER — Other Ambulatory Visit: Payer: Self-pay

## 2020-01-20 DIAGNOSIS — E1165 Type 2 diabetes mellitus with hyperglycemia: Secondary | ICD-10-CM

## 2020-01-20 MED ORDER — PEN NEEDLES 32G X 4 MM MISC: 1.0000 [IU] | Freq: Every day | 5 refills | Status: DC

## 2020-01-31 DIAGNOSIS — D631 Anemia in chronic kidney disease: Secondary | ICD-10-CM | POA: Diagnosis not present

## 2020-01-31 DIAGNOSIS — I10 Essential (primary) hypertension: Secondary | ICD-10-CM | POA: Diagnosis not present

## 2020-01-31 DIAGNOSIS — E210 Primary hyperparathyroidism: Secondary | ICD-10-CM | POA: Diagnosis not present

## 2020-01-31 DIAGNOSIS — N184 Chronic kidney disease, stage 4 (severe): Secondary | ICD-10-CM | POA: Diagnosis not present

## 2020-01-31 DIAGNOSIS — E1122 Type 2 diabetes mellitus with diabetic chronic kidney disease: Secondary | ICD-10-CM | POA: Diagnosis not present

## 2020-03-11 NOTE — Progress Notes (Signed)
[facility]  [address] [place],  [address] Phone: [phone]  Fax: [phone]   Clinic Day:  03/12/2020  Referring physician: Anthonette Legato, MD  Chief Complaint: Frances Greene is a 75 y.o. female with anemia in chronic kidney disease who is referred in consultation by Dr. Holley Raring for assessment and management.   HPI: The patient saw Dr. Grayland Ormond in 2018. Frances Greene was noted to have sickle cell trait. It was felt that Frances Greene likely had a mild baseline anemia due to sickle cell trait. Given that her hemoglobin was stable and the remainder of her blood work was normal or negative, it was agreed that no further follow-up was needed.  The patient saw Dr. 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 Frances Greene has hypertension, diabetes, and proteinuria. Creatinine was 1.83 (CrCl 31 ml/min). Hematocrit was 29.1, hemoglobin 9.5, MCV 86.1, platelets 149,000, WBC 5,500.  Labs followed: 06/23/2016:  Hematocrit 34.4, hemoglobin 11.3, MCV 85.5, platelets 124,000, WBC 5,900. 04/11/2017:  Hematocrit 36.8, hemoglobin 11.5, MCV 86.0, platelets 145,000, WBC 6,200. 12/28/2018:  Hematocrit 32.4, hemoglobin 10.8, MCV 84.2, platelets 159,000, WBC 5,300. 05/17/2019:  Hematocrit 33.2, hemoglobin 10.6, MCV 87.8, platelets 128,000, WBC 5,200. 09/20/2019:  Hematocrit 30.9, hemoglobin 10.1, MCV 85.6, platelets 144,000, WBC 6,500. 01/31/2020:  Hematocrit 29.1, hemoglobin   9.5, MCV 86.1, platelets 149,000, WBC 5,500.  Additional labs: 01/23/2016: Ferritin 213. Iron saturation 28%. TIBC 270. Vitamin B12 364. 06/23/2016: Ferritin 191. Iron saturation 24%. TIBC 268. 10/01/2019: Ferritin 143. Iron saturation 32%. TIBC 227. Vitamin B12 528. Folate >20.0.  SPEP on 01/23/2016 was normal.  Symptomatically, Frances Greene has been "good." Frances Greene has arthritis, numbness in her right hand, occasional urinary urgency, occasional hot flashes at night, and post-nasal drip  due to allergies. Frances Greene takes a natural laxative. Frances Greene fell and broke her arm in 12/2018. Frances Greene lost her balance and fell a month ago.  Frances Greene denies fevers, headaches, sore throat, cough, shortness of breath, chest pain, palpitations, nausea, vomiting, diarrhea, reflux, skin changes, numbness, weakness, and bleeding of any kind.  Frances Greene took oral iron years ago but is not taking it now. Frances Greene denies restless legs and any problems with bleeding. Frances Greene thinks her last colonoscopy was 2-3 years ago; states it was normal.  Her diet is "terrible." Frances Greene eats chicken, fish, fruits, and vegetables. Frances Greene eats meat 5x per week. Frances Greene denies ice pica.  Frances Greene has sickle cell trait. Frances Greene has sleep apnea and uses a CPAP. Frances Greene sleeps on one pillow and does not wake up short of breath.  Her mother had stomach cancer. Her brother had colon cancer.   Past Medical History:  Diagnosis Date  . Arthritis   . Bronchitis   . CHF (congestive heart failure) ([place])   . Chronic kidney disease    decreased kidney function  . Diabetes mellitus without complication ([place])   . Gout   . Hypertension   . Multiple allergies   . Shortness of breath dyspnea    w/ exertion  . Sleep apnea    CPAP  . Swelling of both lower extremities   . Wheezing     Past Surgical History:  Procedure Laterality Date  . ABDOMINAL HYSTERECTOMY    . BREAST BIOPSY Left 1989   neg  . CATARACT EXTRACTION W/PHACO Right 05/21/2015   Procedure: CATARACT EXTRACTION PHACO AND INTRAOCULAR LENS PLACEMENT (IOC);  Surgeon: Milus Height, MD;  Location: [facility] ORS;  Service: Ophthalmology;  Laterality: Right;  [place]: AP%: 10.0 CDE:  9.53  . COLONOSCOPY    . JOINT REPLACEMENT Bilateral    knee  . PARS PLANA VITRECTOMY Right 05/21/2015   Procedure: PARS PLANA VITRECTOMY WITH 25 GAUGE;  Surgeon: Milus Height, MD;  Location: [facility] ORS;  Service: Ophthalmology;  Laterality: Right;  Lot # [id number] H Endo laser: [place]: 300 Pulse Duration: 200 Total Pulses:143   . shoulder  surgery      Family History  Problem Relation Age of Onset  . Breast cancer Cousin   . Breast cancer Other   . Hypertension Mother   . Diabetes Mother   . Hypertension Father   . Diabetes Father   . Alcohol abuse Father   . Aneurysm Sister   . Alcohol abuse Sister   . Stroke Sister        died from stroke  . Hepatitis Brother   . Hypertension Sister        dialysis  . Diabetes Sister   . Alcoholism Brother   . Alcoholism Brother        dies from head injury from a fall.  not an alcoholic,  . Cancer Brother   . Diabetes Brother   . Hypertension Brother   . Diabetes Brother   . Cancer Brother   . Hypertension Brother   . Stroke Brother   . Alcoholism Brother     Social History:  reports that Frances Greene has quit smoking. Her smoking use included cigarettes. Frances Greene has never used smokeless tobacco. Frances Greene reports that Frances Greene does not drink alcohol and does not use drugs. Frances Greene used to smoke. The amount of cigarettes Frances Greene smoked varied. Frances Greene used to drink. Frances Greene quit smoking and drinking over 30 years ago. Frances Greene denies exposure to radiation or toxins. 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 The patient is alone today.  Allergies:  Allergies  Allergen Reactions  . Ramipril Cough and Other (See Comments)    Other reaction(s): Cough Other reaction(s): Cough Other reaction(s): Other (See Comments) Other reaction(s): Cough Other reaction(s): Cough Other reaction(s): Cough     Current Medications: Current Outpatient Medications  Medication Sig Dispense Refill  . acetaminophen (TYLENOL) 325 MG tablet Take 650 mg by mouth every 6 (six) hours as needed.    Marland Kitchen allopurinol (ZYLOPRIM) 300 MG tablet Take 1 tablet (300 mg total) by mouth daily. 90 tablet 1  . AMBULATORY NON FORMULARY MEDICATION Medication Name: geni kot natural veggie laxitive    . aspirin EC 325 MG tablet Take 325 mg by mouth.    Marland Kitchen atorvastatin (LIPITOR) 20 MG tablet Take 1 tablet (20 mg total) by mouth daily. 90 tablet 1  . betamethasone  dipropionate 0.05 % cream Apply topically 2 (two) times daily. 45 g 1  . bisoprolol-hydrochlorothiazide (ZIAC) 2.5-6.25 MG tablet Take 1 tablet by mouth 2 (two) times daily. 180 tablet 1  . cinacalcet (SENSIPAR) 30 MG tablet     . citalopram (CELEXA) 20 MG tablet TAKE 1 TABLET BY MOUTH ONCE DAILY FOR DEPRESSION 90 tablet 0  . diphenhydrAMINE (BENADRYL) 25 MG tablet Take 25 mg by mouth daily.    . folic acid (FOLVITE) 417 MCG tablet Take 0.5 tablets (400 mcg total) by mouth daily. 90 tablet 1  . furosemide (LASIX) 40 MG tablet TAKE 1 TABLET BY MOUTH ONCE DAILY AND  TAKE  XTRA  TAB  IF  NEEDED 180 tablet 1  . Garlic 4081 MG CAPS Take by mouth.    Marland Kitchen glimepiride (AMARYL) 1 MG tablet Take 1 tablet (1 mg total)  by mouth daily. 90 tablet 1  . hydrALAZINE (APRESOLINE) 25 MG tablet Take 1 tab po three times day 270 tablet 0  . ketoconazole (NIZORAL) 2 % shampoo SHAMPOO AS DIRECTED 120 mL 0  . latanoprost (XALATAN) 0.005 % ophthalmic solution latanoprost 0.005 % eye drops  INSTILL 1 DROP INTO EACH EYE IN THE EVENING    . liraglutide (VICTOZA) 18 MG/3ML SOPN INJECT 1.8 MG ONCE DAILY SUBCUTANEOUSLY 270 mL 3  . losartan (COZAAR) 25 MG tablet Take 25 mg by mouth daily. Patient takes 1/2 tab daily    . meloxicam (MOBIC) 7.5 MG tablet TAKE 1 TABLET BY MOUTH ONCE DAILY FOR  GOUT  AND  ARTHRITIS 90 tablet 0  . montelukast (SINGULAIR) 10 MG tablet Take 1 tablet (10 mg total) by mouth daily. 90 tablet 2  . Omega-3 Fatty Acids (FISH OIL PO) Take by mouth.    Marland Kitchen tiZANidine (ZANAFLEX) 2 MG tablet Take 1 tablet (2 mg total) by mouth every 8 (eight) hours as needed for muscle spasms. 30 tablet 0  . Vitamin D, Ergocalciferol, (DRISDOL) 50000 units CAPS capsule     . Alcohol Swabs (B-D SINGLE USE SWABS REGULAR) PADS Use as directed twice daily DX E11.65 (Patient not taking: Reported on 03/12/2020) 100 each 3  . Blood Glucose Calibration (TRUE METRIX LEVEL 1) Low SOLN Use as directed (Patient not taking: Reported on  03/12/2020) 1 each 0  . Blood Glucose Monitoring Suppl (TRUE METRIX AIR GLUCOSE METER) w/Device KIT Use as directed to check glucose DX E11.65 (Patient not taking: Reported on 03/12/2020) 1 kit 0  . Ferrous Sulfate 134 MG TABS Take 1 tablet by mouth daily. (Patient not taking: Reported on 03/12/2020)    . gabapentin (NEURONTIN) 100 MG capsule Take 2 -4 cap at night for numbness in hands (Patient not taking: Reported on 03/12/2020) 120 capsule 3  . glucose blood (TRUE METRIX BLOOD GLUCOSE TEST) test strip 1 each by Other route in the morning and at bedtime. Dx E11.65 (Patient not taking: Reported on 03/12/2020) 100 each 3  . HYDROcodone-acetaminophen (NORCO/VICODIN) 5-325 MG tablet Take 1 tablet by mouth every 6 (six) hours as needed for moderate pain or severe pain. (Patient not taking: Reported on 03/12/2020) 12 tablet 0  . Insulin Pen Needle (PEN NEEDLES) 32G X 4 MM MISC 1 Units by Does not apply route daily. Use with victoza injections daily. (Patient not taking: Reported on 03/12/2020) 100 each 5  . linaclotide (LINZESS) 145 MCG CAPS capsule Take 1 capsule (145 mcg total) by mouth daily before breakfast. (Patient not taking: Reported on 03/12/2020) 90 capsule 3  . polyethylene glycol (MIRALAX / GLYCOLAX) packet Take 17 g by mouth daily as needed. (Patient not taking: Reported on 03/12/2020)    . senna (SENOKOT) 8.6 MG TABS tablet Take 1 tablet by mouth. (Patient not taking: Reported on 03/12/2020)    . Sulfacetamide Sodium (SODIUM SULFACETAMIDE) 10 % SHAM Apply topically. (Patient not taking: Reported on 03/12/2020)    . TRUEplus Lancets 33G MISC TEST BLOOD SUGAR IN THE MORNING AND AT BEDTIME (Patient not taking: Reported on 03/12/2020) 200 each 3   No current facility-administered medications for this visit.    Review of Systems  Constitutional: Positive for diaphoresis (hot flashes at night). Negative for chills, fever, malaise/fatigue and weight loss.  HENT: Negative for congestion, ear discharge, ear  pain, hearing loss, nosebleeds, sinus pain, sore throat and tinnitus.        Post nasal drip  Eyes: Negative  for blurred vision.  Respiratory: Negative for cough, hemoptysis, sputum production and shortness of breath.   Cardiovascular: Negative for chest pain, palpitations, orthopnea and leg swelling.  Gastrointestinal: Negative for abdominal pain, blood in stool, constipation, diarrhea, heartburn, melena, nausea and vomiting.  Genitourinary: Positive for urgency (occasional). Negative for dysuria, frequency and hematuria.  Musculoskeletal: Positive for falls (1 month ago, lost her balance) and joint pain (arthritis). Negative for back pain, myalgias and neck pain.  Skin: Negative for itching and rash.  Neurological: Positive for sensory change (numbness in right hand). Negative for dizziness, tingling, weakness and headaches.  Endo/Heme/Allergies: Positive for environmental allergies. Does not bruise/bleed easily.  Psychiatric/Behavioral: Negative for depression and memory loss. The patient is not nervous/anxious and does not have insomnia.   All other systems reviewed and are negative.  Performance status (ECOG): 1  Vital Signs Blood pressure (!) 141/68, pulse 66, temperature (!) 97.4 F (36.3 C), temperature source Tympanic, resp. rate 18, weight 233 lb 11 oz (106 kg), SpO2 98 %.   Physical Exam Vitals and nursing note reviewed.  Constitutional:      General: Frances Greene is not in acute distress.    Appearance: Frances Greene is not diaphoretic.  HENT:     Head: Normocephalic and atraumatic.     Comments: Short gray hair.    Mouth/Throat:     Mouth: Mucous membranes are moist.     Pharynx: Oropharynx is clear.  Eyes:     General: No scleral icterus.    Extraocular Movements: Extraocular movements intact.     Conjunctiva/sclera: Conjunctivae normal.     Pupils: Pupils are equal, round, and reactive to light.     Comments: Glasses.  Brown eyes.  Cardiovascular:     Rate and Rhythm: Normal rate  and regular rhythm.     Heart sounds: Normal heart sounds. No murmur heard.   Pulmonary:     Effort: Pulmonary effort is normal. No respiratory distress.     Breath sounds: Normal breath sounds. No wheezing or rales.  Chest:     Chest wall: No tenderness.  Breasts:     Right: No axillary adenopathy or supraclavicular adenopathy.     Left: No axillary adenopathy or supraclavicular adenopathy.    Abdominal:     General: Bowel sounds are normal. There is no distension.     Palpations: Abdomen is soft. There is no mass.     Tenderness: There is no abdominal tenderness. There is no guarding or rebound.  Musculoskeletal:        General: No swelling or tenderness. Normal range of motion.     Cervical back: Normal range of motion and neck supple.  Lymphadenopathy:     Head:     Right side of head: No preauricular, posterior auricular or occipital adenopathy.     Left side of head: No preauricular, posterior auricular or occipital adenopathy.     Cervical: No cervical adenopathy.     Upper Body:     Right upper body: No supraclavicular or axillary adenopathy.     Left upper body: No supraclavicular or axillary adenopathy.     Lower Body: No right inguinal adenopathy. No left inguinal adenopathy.  Skin:    General: Skin is warm and dry.  Neurological:     Mental Status: Frances Greene is alert and oriented to person, place, and time.  Psychiatric:        Behavior: Behavior normal.        Thought Content: Thought content normal.  Judgment: Judgment normal.     No visits with results within 3 Day(s) from this visit.  Latest known visit with results is:  Office Visit on 01/01/2020  Component Date Value Ref Range Status  . Hemoglobin A1C 01/01/2020 6.2* 4.0 - 5.6 % Final  . Microalbumin Ur, POC 01/01/2020 30-300  mg/L Final  . Creatinine, POC 01/01/2020 50  mg/dL Final  . Albumin/Creatinine Ratio, Urine, P* 01/01/2020 30   Final    Assessment:  Frances Greene is a 75 y.o. female  with anemia in chronic kidney disease who is referred in consultation by Dr. Holley Raring for assessment and management.   CBC on 01/31/2020 revealed a hematocrit of 29.1, hemoglobin   9.5, MCV 86.1, platelets 149,000, WBC 5,500. Creatinine was 1.83 (CrCl 31 ml/min).  Labs on 10/01/2019 included a ferritin of 143 with an iron saturation 32% and TIBC 227. Vitamin B12 was 528 and folate >20.0.  Frances Greene states her last colonoscopy was 2-3 years ago and was normal.  Frances Greene has sickle cell trait.  Symptomatically, Frances Greene feels "good."  Frances Greene denies any bleeding.  Diet appears good.  Plan: 1.   Labs today:  CBC with diff, ferritin, iron studies, B12, folate, TSH. 2.   Anemia in chronic kidney disease  Review available labs.  Patient appears to have a declining hemoglobin which is fluctuating slightly.  Discuss work-up to ensure no other etiology of anemia.  Discuss use of Retacrit if hemoglobin < 10.   Potential side effects of Retacrit were reviewed.   Information was provided.  Preauth Retacrit. 3.   RTC in 1 week for MD assessment, labs (HCT/Hgb), and +/-Retacrit.  I discussed the assessment and treatment plan with the patient.  The patient was provided an opportunity to ask questions and all were answered.  The patient agreed with the plan and demonstrated an understanding of the instructions.  The patient was advised to call back if the symptoms worsen or if the condition fails to improve as anticipated.  I provided 28 minutes of face-to-face time during this this encounter and > 50% was spent counseling as documented under my assessment and plan.  An additional 10 minutes were spent reviewing her chart (Epic and Care Everywhere) including notes, labs, and imaging studies.    Harla Mensch C. Mike Gip, MD, PhD    03/12/2020, 3:50 PM  I, Mirian Mo Tufford, am acting as Education administrator for [employer]. Mike Gip, MD, PhD.  I, Marjo Grosvenor C. Mike Gip, MD, have reviewed the above documentation for accuracy and completeness, and I  agree with the above.

## 2020-03-12 ENCOUNTER — Other Ambulatory Visit: Payer: Self-pay

## 2020-03-12 ENCOUNTER — Inpatient Hospital Stay: Payer: HMO

## 2020-03-12 ENCOUNTER — Encounter (INDEPENDENT_AMBULATORY_CARE_PROVIDER_SITE_OTHER): Payer: Self-pay

## 2020-03-12 ENCOUNTER — Encounter: Payer: Self-pay | Admitting: Hematology and Oncology

## 2020-03-12 ENCOUNTER — Inpatient Hospital Stay: Payer: HMO | Attending: Hematology and Oncology | Admitting: Hematology and Oncology

## 2020-03-12 VITALS — BP 141/68 | HR 66 | Temp 97.4°F | Resp 18 | Wt 233.7 lb

## 2020-03-12 DIAGNOSIS — Z79899 Other long term (current) drug therapy: Secondary | ICD-10-CM | POA: Insufficient documentation

## 2020-03-12 DIAGNOSIS — N1832 Chronic kidney disease, stage 3b: Secondary | ICD-10-CM

## 2020-03-12 DIAGNOSIS — R0982 Postnasal drip: Secondary | ICD-10-CM | POA: Insufficient documentation

## 2020-03-12 DIAGNOSIS — I509 Heart failure, unspecified: Secondary | ICD-10-CM | POA: Insufficient documentation

## 2020-03-12 DIAGNOSIS — G4730 Sleep apnea, unspecified: Secondary | ICD-10-CM | POA: Diagnosis not present

## 2020-03-12 DIAGNOSIS — I130 Hypertensive heart and chronic kidney disease with heart failure and stage 1 through stage 4 chronic kidney disease, or unspecified chronic kidney disease: Secondary | ICD-10-CM | POA: Diagnosis not present

## 2020-03-12 DIAGNOSIS — R3915 Urgency of urination: Secondary | ICD-10-CM | POA: Insufficient documentation

## 2020-03-12 DIAGNOSIS — D573 Sickle-cell trait: Secondary | ICD-10-CM | POA: Insufficient documentation

## 2020-03-12 DIAGNOSIS — Z800 Family history of malignant neoplasm of digestive organs: Secondary | ICD-10-CM | POA: Insufficient documentation

## 2020-03-12 DIAGNOSIS — E1122 Type 2 diabetes mellitus with diabetic chronic kidney disease: Secondary | ICD-10-CM | POA: Insufficient documentation

## 2020-03-12 DIAGNOSIS — Z9989 Dependence on other enabling machines and devices: Secondary | ICD-10-CM | POA: Insufficient documentation

## 2020-03-12 DIAGNOSIS — F17210 Nicotine dependence, cigarettes, uncomplicated: Secondary | ICD-10-CM | POA: Diagnosis not present

## 2020-03-12 DIAGNOSIS — R232 Flushing: Secondary | ICD-10-CM | POA: Insufficient documentation

## 2020-03-12 DIAGNOSIS — R200 Anesthesia of skin: Secondary | ICD-10-CM | POA: Diagnosis not present

## 2020-03-12 DIAGNOSIS — D631 Anemia in chronic kidney disease: Secondary | ICD-10-CM | POA: Diagnosis not present

## 2020-03-12 LAB — IRON AND TIBC
Iron: 76 ug/dL (ref 28–170)
Saturation Ratios: 24 % (ref 10.4–31.8)
TIBC: 315 ug/dL (ref 250–450)
UIBC: 239 ug/dL

## 2020-03-12 LAB — CBC WITH DIFFERENTIAL/PLATELET
Abs Immature Granulocytes: 0.02 10*3/uL (ref 0.00–0.07)
Basophils Absolute: 0 10*3/uL (ref 0.0–0.1)
Basophils Relative: 0 %
Eosinophils Absolute: 0.2 10*3/uL (ref 0.0–0.5)
Eosinophils Relative: 4 %
HCT: 34.1 % — ABNORMAL LOW (ref 36.0–46.0)
Hemoglobin: 10.9 g/dL — ABNORMAL LOW (ref 12.0–15.0)
Immature Granulocytes: 0 %
Lymphocytes Relative: 21 %
Lymphs Abs: 1 10*3/uL (ref 0.7–4.0)
MCH: 27 pg (ref 26.0–34.0)
MCHC: 32 g/dL (ref 30.0–36.0)
MCV: 84.6 fL (ref 80.0–100.0)
Monocytes Absolute: 0.4 10*3/uL (ref 0.1–1.0)
Monocytes Relative: 8 %
Neutro Abs: 3.1 10*3/uL (ref 1.7–7.7)
Neutrophils Relative %: 67 %
Platelets: 147 10*3/uL — ABNORMAL LOW (ref 150–400)
RBC: 4.03 MIL/uL (ref 3.87–5.11)
RDW: 13.6 % (ref 11.5–15.5)
WBC: 4.8 10*3/uL (ref 4.0–10.5)
nRBC: 0 % (ref 0.0–0.2)

## 2020-03-12 LAB — FERRITIN: Ferritin: 44 ng/mL (ref 11–307)

## 2020-03-12 LAB — FOLATE: Folate: >100 ng/mL (ref >5.9)

## 2020-03-12 LAB — VITAMIN B12: Vitamin B-12: 271 pg/mL (ref 180–914)

## 2020-03-12 LAB — TSH: TSH: 3.436 u[IU]/mL (ref 0.350–4.500)

## 2020-03-12 NOTE — Progress Notes (Signed)
SOB with exertion. Numbness in both hands stopped taking gabapentin because it didn't help.

## 2020-03-12 NOTE — Patient Instructions (Signed)

## 2020-03-18 DIAGNOSIS — H401122 Primary open-angle glaucoma, left eye, moderate stage: Secondary | ICD-10-CM | POA: Diagnosis not present

## 2020-03-18 NOTE — Progress Notes (Signed)
[facility]  [address] [place], [place] [address] Phone: [phone]  Fax: [phone]   Clinic Day:  03/19/2020  Referring physician: Lavera Guise, MD  Chief Complaint: Frances Greene is a 75 y.o. 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  HPI: The patient was last seen in the hematology clinic on 03/12/2020 for new patient assessment. At that time, she felt "good".  She was off iron, but took it years ago.  She ate meat 5 times a week.  She denied pica.  Work-up revealed a hematocrit of 34.1, hemoglobin 10.9, MCV 84.6, platelets 147,000, WBC 4,800. Ferritin was 44 with an iron saturation of 24% and a TIBC of 315. Vitamin B12 was 271 (low) and folate >100.0. TSH was 3.436.  During the interim, she has been good. She has improved her diet and is down 4 lbs. She would like to weigh less than 200 lbs. She denies nausea, vomiting, and diarrhea.  The patient has rare hot flashes at night. She has not needed to take arthritis medication. She denies falls. Her right hand is numb at night. Her post nasal drip is not bad.  She used to take vitamin B12 1000 mcg but does not anymore. One of her doctors took her off of it; she is not sure why. She takes folic acid 599 mcg daily.   Past Medical History:  Diagnosis Date  . Arthritis   . Bronchitis   . CHF (congestive heart failure) ([place])   . Chronic kidney disease    decreased kidney function  . Diabetes mellitus without complication ([place])   . Gout   . Hypertension   . Multiple allergies   . Shortness of breath dyspnea    w/ exertion  . Sleep apnea    CPAP  . Swelling of both lower extremities   . Wheezing     Past Surgical History:  Procedure Laterality Date  . ABDOMINAL HYSTERECTOMY    . BREAST BIOPSY Left 1989   neg  . CATARACT EXTRACTION W/PHACO Right 05/21/2015   Procedure: CATARACT EXTRACTION PHACO AND INTRAOCULAR LENS PLACEMENT (IOC);   Surgeon: Milus Height, MD;  Location: [facility] ORS;  Service: Ophthalmology;  Laterality: Right;  [place]: AP%: 10.0 CDE: 9.53  . COLONOSCOPY    . JOINT REPLACEMENT Bilateral    knee  . PARS PLANA VITRECTOMY Right 05/21/2015   Procedure: PARS PLANA VITRECTOMY WITH 25 GAUGE;  Surgeon: Milus Height, MD;  Location: [facility] ORS;  Service: Ophthalmology;  Laterality: Right;  Lot # [id number] H Endo laser: [place]: 300 Pulse Duration: 200 Total Pulses:143   . shoulder surgery      Family History  Problem Relation Age of Onset  . Breast cancer Cousin   . Breast cancer Other   . Hypertension Mother   . Diabetes Mother   . Hypertension Father   . Diabetes Father   . Alcohol abuse Father   . Aneurysm Sister   . Alcohol abuse Sister   . Stroke Sister        died from stroke  . Hepatitis Brother   . Hypertension Sister        dialysis  . Diabetes Sister   . Alcoholism Brother   . Alcoholism Brother        dies from head injury from a fall.  not an alcoholic,  . Cancer Brother   . Diabetes Brother   . Hypertension Brother   .  Diabetes Brother   . Cancer Brother   . Hypertension Brother   . Stroke Brother   . Alcoholism Brother     Social History:  reports that she has quit smoking. Her smoking use included cigarettes. She has never used smokeless tobacco. She reports that she does not drink alcohol and does not use drugs. She used to smoke. The amount of cigarettes she smoked varied. She used to drink. She quit smoking and drinking over 30 years ago. She denies exposure to radiation or toxins. She used to work as a Biomedical scientist. The patient is alone today.  Allergies:  Allergies  Allergen Reactions  . Ramipril Cough and Other (See Comments)    Other reaction(s): Cough Other reaction(s): Cough Other reaction(s): Other (See Comments) Other reaction(s): Cough Other reaction(s): Cough Other reaction(s): Cough     Current Medications: Current Outpatient Medications  Medication Sig Dispense  Refill  . acetaminophen (TYLENOL) 325 MG tablet Take 650 mg by mouth every 6 (six) hours as needed.    Marland Kitchen allopurinol (ZYLOPRIM) 300 MG tablet Take 1 tablet (300 mg total) by mouth daily. 90 tablet 1  . AMBULATORY NON FORMULARY MEDICATION Medication Name: geni kot natural veggie laxitive    . aspirin EC 325 MG tablet Take 325 mg by mouth.    Marland Kitchen atorvastatin (LIPITOR) 20 MG tablet Take 1 tablet (20 mg total) by mouth daily. 90 tablet 1  . betamethasone dipropionate 0.05 % cream Apply topically 2 (two) times daily. 45 g 1  . bisoprolol-hydrochlorothiazide (ZIAC) 2.5-6.25 MG tablet Take 1 tablet by mouth 2 (two) times daily. 180 tablet 1  . cinacalcet (SENSIPAR) 30 MG tablet     . citalopram (CELEXA) 20 MG tablet TAKE 1 TABLET BY MOUTH ONCE DAILY FOR DEPRESSION 90 tablet 0  . diphenhydrAMINE (BENADRYL) 25 MG tablet Take 25 mg by mouth daily.    . folic acid (FOLVITE) 914 MCG tablet Take 0.5 tablets (400 mcg total) by mouth daily. 90 tablet 1  . furosemide (LASIX) 40 MG tablet TAKE 1 TABLET BY MOUTH ONCE DAILY AND  TAKE  XTRA  TAB  IF  NEEDED 180 tablet 1  . Garlic 7829 MG CAPS Take by mouth.    Marland Kitchen glimepiride (AMARYL) 1 MG tablet Take 1 tablet (1 mg total) by mouth daily. 90 tablet 1  . hydrALAZINE (APRESOLINE) 25 MG tablet Take 1 tab po three times day 270 tablet 0  . ketoconazole (NIZORAL) 2 % shampoo SHAMPOO AS DIRECTED 120 mL 0  . latanoprost (XALATAN) 0.005 % ophthalmic solution latanoprost 0.005 % eye drops  INSTILL 1 DROP INTO EACH EYE IN THE EVENING    . liraglutide (VICTOZA) 18 MG/3ML SOPN INJECT 1.8 MG ONCE DAILY SUBCUTANEOUSLY 270 mL 3  . losartan (COZAAR) 25 MG tablet Take 25 mg by mouth daily. Patient takes 1/2 tab daily    . meloxicam (MOBIC) 7.5 MG tablet TAKE 1 TABLET BY MOUTH ONCE DAILY FOR  GOUT  AND  ARTHRITIS 90 tablet 0  . montelukast (SINGULAIR) 10 MG tablet Take 1 tablet (10 mg total) by mouth daily. 90 tablet 2  . Omega-3 Fatty Acids (FISH OIL PO) Take by mouth.    Marland Kitchen  tiZANidine (ZANAFLEX) 2 MG tablet Take 1 tablet (2 mg total) by mouth every 8 (eight) hours as needed for muscle spasms. 30 tablet 0  . Vitamin D, Ergocalciferol, (DRISDOL) 50000 units CAPS capsule     . Alcohol Swabs (B-D SINGLE USE SWABS REGULAR) PADS Use as directed  twice daily DX E11.65 (Patient not taking: No sig reported) 100 each 3  . Blood Glucose Calibration (TRUE METRIX LEVEL 1) Low SOLN Use as directed (Patient not taking: No sig reported) 1 each 0  . Blood Glucose Monitoring Suppl (TRUE METRIX AIR GLUCOSE METER) w/Device KIT Use as directed to check glucose DX E11.65 (Patient not taking: No sig reported) 1 kit 0  . Ferrous Sulfate 134 MG TABS Take 1 tablet by mouth daily. (Patient not taking: No sig reported)    . gabapentin (NEURONTIN) 100 MG capsule Take 2 -4 cap at night for numbness in hands (Patient not taking: No sig reported) 120 capsule 3  . glucose blood (TRUE METRIX BLOOD GLUCOSE TEST) test strip 1 each by Other route in the morning and at bedtime. Dx E11.65 (Patient not taking: No sig reported) 100 each 3  . HYDROcodone-acetaminophen (NORCO/VICODIN) 5-325 MG tablet Take 1 tablet by mouth every 6 (six) hours as needed for moderate pain or severe pain. (Patient not taking: No sig reported) 12 tablet 0  . Insulin Pen Needle (PEN NEEDLES) 32G X 4 MM MISC 1 Units by Does not apply route daily. Use with victoza injections daily. (Patient not taking: No sig reported) 100 each 5  . linaclotide (LINZESS) 145 MCG CAPS capsule Take 1 capsule (145 mcg total) by mouth daily before breakfast. (Patient not taking: No sig reported) 90 capsule 3  . polyethylene glycol (MIRALAX / GLYCOLAX) packet Take 17 g by mouth daily as needed. (Patient not taking: No sig reported)    . senna (SENOKOT) 8.6 MG TABS tablet Take 1 tablet by mouth. (Patient not taking: No sig reported)    . Sulfacetamide Sodium (SODIUM SULFACETAMIDE) 10 % SHAM Apply topically. (Patient not taking: No sig reported)    . TRUEplus  Lancets 33G MISC TEST BLOOD SUGAR IN THE MORNING AND AT BEDTIME (Patient not taking: No sig reported) 200 each 3   No current facility-administered medications for this visit.    Review of Systems  Constitutional: Positive for diaphoresis (hot flashes at night, rare) and weight loss (4 lbs, intentional). Negative for chills, fever and malaise/fatigue.  HENT: Negative for congestion, ear discharge, ear pain, hearing loss, nosebleeds, sinus pain, sore throat and tinnitus.        Post nasal drip  Eyes: Negative for blurred vision.  Respiratory: Negative for cough, hemoptysis, sputum production and shortness of breath.   Cardiovascular: Negative for chest pain, palpitations, orthopnea and leg swelling.  Gastrointestinal: Negative for abdominal pain, blood in stool, constipation, diarrhea, heartburn, melena, nausea and vomiting.  Genitourinary: Negative for dysuria, frequency, hematuria and urgency.  Musculoskeletal: Negative for back pain, joint pain (arthritis), myalgias and neck pain.  Skin: Negative for itching and rash.  Neurological: Positive for sensory change (numbness in right hand). Negative for dizziness, tingling, weakness and headaches.  Endo/Heme/Allergies: Does not bruise/bleed easily.  Psychiatric/Behavioral: Negative for depression and memory loss. The patient is not nervous/anxious and does not have insomnia.   All other systems reviewed and are negative.  Performance status (ECOG): 1  Vital Signs Blood pressure (!) 141/60, pulse 71, temperature (!) 95.1 F (35.1 C), temperature source Tympanic, resp. rate 18, weight 229 lb 4.5 oz (104 kg), SpO2 99 %.   Physical Exam Vitals and nursing note reviewed.  Constitutional:      General: She is not in acute distress.    Appearance: She is not diaphoretic.  Eyes:     General: No scleral icterus.  Conjunctiva/sclera: Conjunctivae normal.  Neurological:     Mental Status: She is alert and oriented to person, place, and time.   Psychiatric:        Behavior: Behavior normal.        Thought Content: Thought content normal.        Judgment: Judgment normal.     No visits with results within 3 Day(s) from this visit.  Latest known visit with results is:  Office Visit on 03/12/2020  Component Date Value Ref Range Status  . TSH 03/12/2020 3.436  0.350 - 4.500 uIU/mL Final   Comment: Performed by a 3rd Generation assay with a functional sensitivity of <=0.01 uIU/mL. Performed at [facility], [address], [place], [place] [address]   . Folate 03/12/2020 >100.0  >5.9 ng/mL Final   Comment: RESULT CONFIRMED BY MANUAL DILUTION HNM Performed at [facility], [address] [place], [place] [address]   . Vitamin B-12 03/12/2020 271  180 - 914 pg/mL Final   Comment: (NOTE) This assay is not validated for testing neonatal or myeloproliferative syndrome specimens for Vitamin B12 levels. Performed at [facility], [place] [address], [place], [place] [address]   . Iron 03/12/2020 76  28 - 170 ug/dL Final  . TIBC 03/12/2020 315  250 - 450 ug/dL Final  . Saturation Ratios 03/12/2020 24  10.4 - 31.8 % Final  . UIBC 03/12/2020 239  ug/dL Final   Performed at [facility], [address], [place], [place] [address]  . Ferritin 03/12/2020 44  11 - 307 ng/mL Final   Performed at [facility], [address] [place], [place] [address]  . WBC 03/12/2020 4.8  4.0 - 10.5 K/uL Final  . RBC 03/12/2020 4.03  3.87 - 5.11 MIL/uL Final  . Hemoglobin 03/12/2020 10.9* 12.0 - 15.0 g/dL Final  . HCT 03/12/2020 34.1* 36.0 - 46.0 % Final  . MCV 03/12/2020 84.6  80.0 - 100.0 fL Final  . MCH 03/12/2020 27.0  26.0 - 34.0 pg Final  . MCHC 03/12/2020 32.0  30.0 - 36.0 g/dL Final  . RDW 03/12/2020 13.6  11.5 - 15.5 % Final  . Platelets 03/12/2020 147* 150 - 400 K/uL Final  . nRBC 03/12/2020 0.0  0.0 - 0.2 % Final  . Neutrophils Relative % 03/12/2020 67  % Final  . Neutro Abs 03/12/2020 3.1   1.7 - 7.7 K/uL Final  . Lymphocytes Relative 03/12/2020 21  % Final  . Lymphs Abs 03/12/2020 1.0  0.7 - 4.0 K/uL Final  . Monocytes Relative 03/12/2020 8  % Final  . Monocytes Absolute 03/12/2020 0.4  0.1 - 1.0 K/uL Final  . Eosinophils Relative 03/12/2020 4  % Final  . Eosinophils Absolute 03/12/2020 0.2  0.0 - 0.5 K/uL Final  . Basophils Relative 03/12/2020 0  % Final  . Basophils Absolute 03/12/2020 0.0  0.0 - 0.1 K/uL Final  . Immature Granulocytes 03/12/2020 0  % Final  . Abs Immature Granulocytes 03/12/2020 0.02  0.00 - 0.07 K/uL Final   Performed at [facility], [address] [place], [place] [address]    Assessment:  KIRSTON LUTY is a 75 y.o. female with anemia in chronic kidney disease.   CBC on 01/31/2020 revealed a hematocrit of 29.1, hemoglobin   9.5, MCV 86.1, platelets 149,000, WBC 5,500. Creatinine was 1.83 (CrCl 31 ml/min).  Labs on 10/01/2019 included a ferritin of 143 with an iron saturation 32% and TIBC 227.  Vitamin B12 was 528 and folate >20.0.  Work-up on 03/12/2020 revealed a hematocrit of 34.1, hemoglobin 10.9, MCV 84.6, platelets 147,000, WBC 4,800. Ferritin was 44 with an iron saturation of 24% and a TIBC of 315. Vitamin B12 was 271 (low) and folate >100.0. TSH was 3.436.  Symptomatically, she feels good. She has improved her diet.  Plan: 1.   Review work-up. 2.   Anemia in chronic kidney disease  Hematocrit 34.1.  Hemoglobin 10.9.  MCV 84.6 on 03/12/2020.  Counts are good.  Discuss initiation of Retacrit when hemoglobin is < 10. 3.   B12 deficiency  B12 was 271 on 03/12/2020.  Discuss B12 supplementation.  B12 goal is 400. 4.   No Retacrit. 5.   RTC in 2 months for labs (CBC, ferritin, B12, folate). 6.   RTC in 4 months for MD assess and labs (CBC with diff, ferritin) and +/- Retacrit.  I discussed the assessment and treatment plan with the patient.  The patient was provided an opportunity to ask questions and all were answered.  The  patient agreed with the plan and demonstrated an understanding of the instructions.  The patient was advised to call back if the symptoms worsen or if the condition fails to improve as anticipated.  I provided 17 minutes of face-to-face time during this this encounter and > 50% was spent counseling as documented under my assessment and plan. An additional 5 minutes were spent reviewing her chart (Epic and Care Everywhere) including notes, labs, and imaging studies.    Brecklynn Jian C. Mike Gip, MD, PhD    03/19/2020, 3:12 PM  I, Mirian Mo Tufford, am acting as Education administrator for [employer]. Mike Gip, MD, PhD.  I, Aren Pryde C. Mike Gip, MD, have reviewed the above documentation for accuracy and completeness, and I agree with the above.

## 2020-03-19 ENCOUNTER — Other Ambulatory Visit: Payer: Self-pay

## 2020-03-19 ENCOUNTER — Inpatient Hospital Stay: Payer: HMO

## 2020-03-19 ENCOUNTER — Encounter: Payer: Self-pay | Admitting: Hematology and Oncology

## 2020-03-19 ENCOUNTER — Inpatient Hospital Stay: Payer: HMO | Attending: Hematology and Oncology | Admitting: Hematology and Oncology

## 2020-03-19 VITALS — BP 141/60 | HR 71 | Temp 95.1°F | Resp 18 | Wt 229.3 lb

## 2020-03-19 DIAGNOSIS — D631 Anemia in chronic kidney disease: Secondary | ICD-10-CM | POA: Diagnosis not present

## 2020-03-19 DIAGNOSIS — D509 Iron deficiency anemia, unspecified: Secondary | ICD-10-CM

## 2020-03-19 DIAGNOSIS — E119 Type 2 diabetes mellitus without complications: Secondary | ICD-10-CM | POA: Insufficient documentation

## 2020-03-19 DIAGNOSIS — G4730 Sleep apnea, unspecified: Secondary | ICD-10-CM | POA: Diagnosis not present

## 2020-03-19 DIAGNOSIS — I509 Heart failure, unspecified: Secondary | ICD-10-CM | POA: Insufficient documentation

## 2020-03-19 DIAGNOSIS — E538 Deficiency of other specified B group vitamins: Secondary | ICD-10-CM | POA: Diagnosis not present

## 2020-03-19 DIAGNOSIS — I130 Hypertensive heart and chronic kidney disease with heart failure and stage 1 through stage 4 chronic kidney disease, or unspecified chronic kidney disease: Secondary | ICD-10-CM | POA: Insufficient documentation

## 2020-03-19 DIAGNOSIS — Z79899 Other long term (current) drug therapy: Secondary | ICD-10-CM | POA: Insufficient documentation

## 2020-03-19 DIAGNOSIS — Z7982 Long term (current) use of aspirin: Secondary | ICD-10-CM | POA: Diagnosis not present

## 2020-03-19 DIAGNOSIS — Z87891 Personal history of nicotine dependence: Secondary | ICD-10-CM | POA: Diagnosis not present

## 2020-03-19 DIAGNOSIS — R232 Flushing: Secondary | ICD-10-CM | POA: Insufficient documentation

## 2020-03-19 DIAGNOSIS — N1832 Chronic kidney disease, stage 3b: Secondary | ICD-10-CM | POA: Diagnosis not present

## 2020-03-19 DIAGNOSIS — M1990 Unspecified osteoarthritis, unspecified site: Secondary | ICD-10-CM | POA: Diagnosis not present

## 2020-03-19 NOTE — Patient Instructions (Signed)
  Begin B12 1000 mcg by mouth 3 days a week (Monday, Wednesday, Friday).  Folic acid stop taking it for 3 weeks then start taking it 3 days a week (Monday, Wednesday, Friday).

## 2020-03-21 ENCOUNTER — Other Ambulatory Visit: Payer: Self-pay | Admitting: Adult Health

## 2020-03-25 ENCOUNTER — Other Ambulatory Visit: Payer: HMO

## 2020-03-25 ENCOUNTER — Ambulatory Visit: Payer: HMO | Admitting: Hematology and Oncology

## 2020-03-25 ENCOUNTER — Ambulatory Visit: Payer: HMO

## 2020-03-25 DIAGNOSIS — N189 Chronic kidney disease, unspecified: Secondary | ICD-10-CM | POA: Diagnosis not present

## 2020-03-25 DIAGNOSIS — D351 Benign neoplasm of parathyroid gland: Secondary | ICD-10-CM | POA: Diagnosis not present

## 2020-03-25 DIAGNOSIS — I10 Essential (primary) hypertension: Secondary | ICD-10-CM | POA: Diagnosis not present

## 2020-03-25 DIAGNOSIS — E559 Vitamin D deficiency, unspecified: Secondary | ICD-10-CM | POA: Diagnosis not present

## 2020-03-25 DIAGNOSIS — E785 Hyperlipidemia, unspecified: Secondary | ICD-10-CM | POA: Diagnosis not present

## 2020-03-25 DIAGNOSIS — E1165 Type 2 diabetes mellitus with hyperglycemia: Secondary | ICD-10-CM | POA: Diagnosis not present

## 2020-03-26 DIAGNOSIS — I10 Essential (primary) hypertension: Secondary | ICD-10-CM | POA: Diagnosis not present

## 2020-03-26 DIAGNOSIS — N1832 Chronic kidney disease, stage 3b: Secondary | ICD-10-CM | POA: Diagnosis not present

## 2020-03-26 DIAGNOSIS — E1122 Type 2 diabetes mellitus with diabetic chronic kidney disease: Secondary | ICD-10-CM | POA: Diagnosis not present

## 2020-03-27 LAB — COMPREHENSIVE METABOLIC PANEL
ALT: 8 IU/L (ref 0–32)
AST: 13 IU/L (ref 0–40)
Albumin/Globulin Ratio: 2.3 — ABNORMAL HIGH (ref 1.2–2.2)
Albumin: 4.4 g/dL (ref 3.7–4.7)
Alkaline Phosphatase: 72 IU/L (ref 44–121)
BUN/Creatinine Ratio: 14 (ref 12–28)
BUN: 32 mg/dL — ABNORMAL HIGH (ref 8–27)
Bilirubin Total: 0.4 mg/dL (ref 0.0–1.2)
CO2: 20 mmol/L (ref 20–29)
Calcium: 8.7 mg/dL (ref 8.7–10.3)
Chloride: 105 mmol/L (ref 96–106)
Creatinine, Ser: 2.25 mg/dL — ABNORMAL HIGH (ref 0.57–1.00)
Globulin, Total: 1.9 g/dL (ref 1.5–4.5)
Glucose: 109 mg/dL — ABNORMAL HIGH (ref 65–99)
Potassium: 3.9 mmol/L (ref 3.5–5.2)
Sodium: 143 mmol/L (ref 134–144)
Total Protein: 6.3 g/dL (ref 6.0–8.5)
eGFR: 22 mL/min/{1.73_m2} — ABNORMAL LOW (ref >59)

## 2020-03-27 LAB — CBC WITH DIFFERENTIAL/PLATELET
Basophils Absolute: 0 10*3/uL (ref 0.0–0.2)
Basos: 0 %
EOS (ABSOLUTE): 0.2 10*3/uL (ref 0.0–0.4)
Eos: 4 %
Hematocrit: 33.1 % — ABNORMAL LOW (ref 34.0–46.6)
Hemoglobin: 11 g/dL — ABNORMAL LOW (ref 11.1–15.9)
Immature Grans (Abs): 0 10*3/uL (ref 0.0–0.1)
Immature Granulocytes: 0 %
Lymphocytes Absolute: 1.2 10*3/uL (ref 0.7–3.1)
Lymphs: 22 %
MCH: 27.6 pg (ref 26.6–33.0)
MCHC: 33.2 g/dL (ref 31.5–35.7)
MCV: 83 fL (ref 79–97)
Monocytes Absolute: 0.4 10*3/uL (ref 0.1–0.9)
Monocytes: 8 %
Neutrophils Absolute: 3.4 10*3/uL (ref 1.4–7.0)
Neutrophils: 66 %
Platelets: 151 10*3/uL (ref 150–450)
RBC: 3.98 x10E6/uL (ref 3.77–5.28)
RDW: 13.4 % (ref 11.7–15.4)
WBC: 5.3 10*3/uL (ref 3.4–10.8)

## 2020-03-27 NOTE — Progress Notes (Signed)
I have reviewed all the lab results. There are some abnormalities that are not critical to the patient's health, but I would like to discuss these in person at an office appointment.

## 2020-03-28 ENCOUNTER — Telehealth: Payer: Self-pay

## 2020-03-28 NOTE — Telephone Encounter (Signed)
Pt returned call

## 2020-03-29 ENCOUNTER — Other Ambulatory Visit: Payer: Self-pay | Admitting: Adult Health

## 2020-03-31 ENCOUNTER — Ambulatory Visit: Payer: Medicare HMO | Admitting: Physician Assistant

## 2020-04-01 ENCOUNTER — Other Ambulatory Visit: Payer: Self-pay

## 2020-04-01 ENCOUNTER — Encounter: Payer: Self-pay | Admitting: Internal Medicine

## 2020-04-01 ENCOUNTER — Ambulatory Visit (INDEPENDENT_AMBULATORY_CARE_PROVIDER_SITE_OTHER): Payer: HMO | Admitting: Internal Medicine

## 2020-04-01 VITALS — BP 168/70 | HR 80 | Temp 97.8°F | Resp 16 | Ht 63.0 in | Wt 229.6 lb

## 2020-04-01 DIAGNOSIS — D638 Anemia in other chronic diseases classified elsewhere: Secondary | ICD-10-CM | POA: Diagnosis not present

## 2020-04-01 DIAGNOSIS — I5189 Other ill-defined heart diseases: Secondary | ICD-10-CM

## 2020-04-01 DIAGNOSIS — N1832 Chronic kidney disease, stage 3b: Secondary | ICD-10-CM

## 2020-04-01 DIAGNOSIS — E1165 Type 2 diabetes mellitus with hyperglycemia: Secondary | ICD-10-CM | POA: Diagnosis not present

## 2020-04-01 DIAGNOSIS — E1122 Type 2 diabetes mellitus with diabetic chronic kidney disease: Secondary | ICD-10-CM | POA: Diagnosis not present

## 2020-04-01 DIAGNOSIS — Z6841 Body Mass Index (BMI) 40.0 and over, adult: Secondary | ICD-10-CM

## 2020-04-01 DIAGNOSIS — H2589 Other age-related cataract: Secondary | ICD-10-CM | POA: Diagnosis not present

## 2020-04-01 DIAGNOSIS — D351 Benign neoplasm of parathyroid gland: Secondary | ICD-10-CM | POA: Diagnosis not present

## 2020-04-01 DIAGNOSIS — E782 Mixed hyperlipidemia: Secondary | ICD-10-CM

## 2020-04-01 DIAGNOSIS — I10 Essential (primary) hypertension: Secondary | ICD-10-CM | POA: Diagnosis not present

## 2020-04-01 DIAGNOSIS — E785 Hyperlipidemia, unspecified: Secondary | ICD-10-CM | POA: Diagnosis not present

## 2020-04-01 DIAGNOSIS — G4762 Sleep related leg cramps: Secondary | ICD-10-CM | POA: Diagnosis not present

## 2020-04-01 DIAGNOSIS — M80022G Age-related osteoporosis with current pathological fracture, left humerus, subsequent encounter for fracture with delayed healing: Secondary | ICD-10-CM | POA: Diagnosis not present

## 2020-04-01 DIAGNOSIS — E6609 Other obesity due to excess calories: Secondary | ICD-10-CM | POA: Diagnosis not present

## 2020-04-01 DIAGNOSIS — N189 Chronic kidney disease, unspecified: Secondary | ICD-10-CM | POA: Diagnosis not present

## 2020-04-01 DIAGNOSIS — N184 Chronic kidney disease, stage 4 (severe): Secondary | ICD-10-CM | POA: Diagnosis not present

## 2020-04-01 DIAGNOSIS — E1159 Type 2 diabetes mellitus with other circulatory complications: Secondary | ICD-10-CM | POA: Diagnosis not present

## 2020-04-01 MED ORDER — TIZANIDINE HCL 2 MG PO CAPS: ORAL_CAPSULE | ORAL | 1 refills | Status: DC

## 2020-04-01 MED ORDER — BISOPROLOL-HYDROCHLOROTHIAZIDE 2.5-6.25 MG PO TABS: 1.0000 | ORAL_TABLET | Freq: Two times a day (BID) | ORAL | 1 refills | Status: DC

## 2020-04-01 NOTE — Progress Notes (Signed)
[facility] [place], [place] [address]  Internal MEDICINE  Office Visit Note  Patient Name: Frances Greene  [id number]  [id number]  Date of Service: 04/08/2020  Chief Complaint  Patient presents with  . Follow-up  . Hypertension  . Sleep Apnea  . Diabetes    HPI Pt is here for routine follow up, Frances Greene has multiple medical problems and is followed by multiple specialities. Frances Greene feels well today, will like to get refills.  DM is well controlled with hg aic 6.0, Weight remains a problem. Activity is poor  Nephrology for CKD. Declining eGFR Hematology for Anemia and iron infusion // Epoetin depending on levels  Endocrinology for hyperPTH  OSA on CPAP Chronic diastolic HF, stable  Frances Greene is scheduled to have Cataract surgery   Current Medication: Outpatient Encounter Medications as of 04/01/2020  Medication Sig Note  . cyanocobalamin 1000 MCG tablet Take 1,000 mcg by mouth daily. Take Monday, Wednesday and Friday   . tizanidine (ZANAFLEX) 2 MG capsule Take one tab po qhs for spasm   . acetaminophen (TYLENOL) 325 MG tablet Take 650 mg by mouth every 6 (six) hours as needed. 03/12/2020: Prn   . allopurinol (ZYLOPRIM) 300 MG tablet Take 1 tablet (300 mg total) by mouth daily.   . AMBULATORY NON FORMULARY MEDICATION Medication Name: geni kot natural veggie laxitive   . aspirin EC 325 MG tablet Take 325 mg by mouth. 01/23/2016: Received from: [place]: Take 325 mg by mouth.  Marland Kitchen atorvastatin (LIPITOR) 20 MG tablet Take 1 tablet (20 mg total) by mouth daily.   . betamethasone dipropionate 0.05 % cream Apply topically 2 (two) times daily.   . Blood Glucose Calibration (TRUE METRIX LEVEL 1) Low SOLN Use as directed (Patient not taking: No sig reported)   . cinacalcet (SENSIPAR) 30 MG tablet    . citalopram (CELEXA) 20 MG tablet TAKE 1 TABLET BY MOUTH ONCE DAILY FOR DEPRESSION   . diphenhydrAMINE (BENADRYL) 25 MG tablet Take 25 mg by mouth daily.   .  furosemide (LASIX) 40 MG tablet TAKE 1 TABLET BY MOUTH ONCE DAILY AND  TAKE  XTRA  TAB  IF  NEEDED   . gabapentin (NEURONTIN) 100 MG capsule Take 2 -4 cap at night for numbness in hands (Patient not taking: No sig reported)   . Garlic 6195 MG CAPS Take by mouth. 01/23/2016: Received from: [place]: Take by mouth.  Marland Kitchen glimepiride (AMARYL) 1 MG tablet Take 1 tablet (1 mg total) by mouth daily.   Marland Kitchen glucose blood (TRUE METRIX BLOOD GLUCOSE TEST) test strip 1 each by Other route in the morning and at bedtime. Dx E11.65 (Patient not taking: No sig reported)   . hydrALAZINE (APRESOLINE) 25 MG tablet Take 1 tab po three times day   . HYDROcodone-acetaminophen (NORCO/VICODIN) 5-325 MG tablet Take 1 tablet by mouth every 6 (six) hours as needed for moderate pain or severe pain. (Patient not taking: No sig reported)   . Insulin Pen Needle (PEN NEEDLES) 32G X 4 MM MISC 1 Units by Does not apply route daily. Use with victoza injections daily. (Patient not taking: No sig reported)   . ketoconazole (NIZORAL) 2 % shampoo SHAMPOO AS DIRECTED   . latanoprost (XALATAN) 0.005 % ophthalmic solution latanoprost 0.005 % eye drops  INSTILL 1 DROP INTO EACH EYE IN THE EVENING   . linaclotide (LINZESS) 145 MCG CAPS capsule Take 1 capsule (145 mcg total) by  mouth daily before breakfast. (Patient not taking: No sig reported)   . liraglutide (VICTOZA) 18 MG/3ML SOPN INJECT 1.8 MG ONCE DAILY SUBCUTANEOUSLY   . losartan (COZAAR) 25 MG tablet Take 25 mg by mouth daily. Patient takes 1/2 tab daily   . meloxicam (MOBIC) 7.5 MG tablet TAKE 1 TABLET BY MOUTH ONCE DAILY FOR  GOUT  AND  ARTHRITIS   . montelukast (SINGULAIR) 10 MG tablet Take 1 tablet (10 mg total) by mouth daily.   . Omega-3 Fatty Acids (FISH OIL PO) Take by mouth. 01/23/2016: Received from: [place]: Take 1 capsule by mouth 2 (two) times daily.  . polyethylene glycol (MIRALAX / GLYCOLAX) packet Take 17 g by  mouth daily as needed. (Patient not taking: No sig reported)   . senna (SENOKOT) 8.6 MG TABS tablet Take 1 tablet by mouth. (Patient not taking: No sig reported)   . Sulfacetamide Sodium (SODIUM SULFACETAMIDE) 10 % SHAM Apply topically. (Patient not taking: No sig reported)   . TRUEplus Lancets 33G MISC TEST BLOOD SUGAR IN THE MORNING AND AT BEDTIME (Patient not taking: No sig reported)   . Vitamin D, Ergocalciferol, (DRISDOL) 50000 units CAPS capsule  01/23/2016: Received from: [facility]  . [DISCONTINUED] Alcohol Swabs (B-D SINGLE USE SWABS REGULAR) PADS Use as directed twice daily DX E11.65 (Patient not taking: No sig reported)   . [DISCONTINUED] bisoprolol-hydrochlorothiazide (ZIAC) 2.5-6.25 MG tablet Take 1 tablet by mouth 2 (two) times daily.   . [DISCONTINUED] Blood Glucose Monitoring Suppl (TRUE METRIX AIR GLUCOSE METER) w/Device KIT Use as directed to check glucose DX E11.65 (Patient not taking: No sig reported)   . [DISCONTINUED] Ferrous Sulfate 134 MG TABS Take 1 tablet by mouth daily. (Patient not taking: No sig reported)   . [DISCONTINUED] folic acid (FOLVITE) 700 MCG tablet Take 0.5 tablets (400 mcg total) by mouth daily. (Patient taking differently: Take 400 mcg by mouth daily. Take Monday, Wednesday, and Friday)   . [DISCONTINUED] tiZANidine (ZANAFLEX) 2 MG tablet Take 1 tablet (2 mg total) by mouth every 8 (eight) hours as needed for muscle spasms.    No facility-administered encounter medications on file as of 04/01/2020.    Surgical History: Past Surgical History:  Procedure Laterality Date  . ABDOMINAL HYSTERECTOMY    . BREAST BIOPSY Left 1989   neg  . CATARACT EXTRACTION W/PHACO Right 05/21/2015   Procedure: CATARACT EXTRACTION PHACO AND INTRAOCULAR LENS PLACEMENT (IOC);  Surgeon: Milus Height, MD;  Location: [facility] ORS;  Service: Ophthalmology;  Laterality: Right;  [place]: AP%: 10.0 CDE: 9.53  . COLONOSCOPY    . JOINT REPLACEMENT Bilateral    knee  . PARS  PLANA VITRECTOMY Right 05/21/2015   Procedure: PARS PLANA VITRECTOMY WITH 25 GAUGE;  Surgeon: Milus Height, MD;  Location: [facility] ORS;  Service: Ophthalmology;  Laterality: Right;  Lot # [id number] H Endo laser: [place]: 300 Pulse Duration: 200 Total Pulses:143   . shoulder surgery      Medical History: Past Medical History:  Diagnosis Date  . Arthritis   . Bronchitis   . CHF (congestive heart failure) ([place])   . Chronic kidney disease    decreased kidney function  . Diabetes mellitus without complication ([place])   . Gout   . Hypertension   . Multiple allergies   . Shortness of breath dyspnea    w/ exertion  . Sleep apnea    CPAP  . Swelling of both lower extremities   . Wheezing  Family History: Family History  Problem Relation Age of Onset  . Breast cancer Cousin   . Breast cancer Other   . Hypertension Mother   . Diabetes Mother   . Hypertension Father   . Diabetes Father   . Alcohol abuse Father   . Aneurysm Sister   . Alcohol abuse Sister   . Stroke Sister        died from stroke  . Hepatitis Brother   . Hypertension Sister        dialysis  . Diabetes Sister   . Alcoholism Brother   . Alcoholism Brother        dies from head injury from a fall.  not an alcoholic,  . Cancer Brother   . Diabetes Brother   . Hypertension Brother   . Diabetes Brother   . Cancer Brother   . Hypertension Brother   . Stroke Brother   . Alcoholism Brother     Social History   Socioeconomic History  . Marital status: Divorced    Spouse name: Not on file  . Number of children: Not on file  . Years of education: Not on file  . Highest education level: Not on file  Occupational History  . Not on file  Tobacco Use  . Smoking status: Former Smoker    Types: Cigarettes  . Smokeless tobacco: Never Used  . Tobacco comment: 4 cigarettes quit 20 years ago  Vaping Use  . Vaping Use: Never used  Substance and Sexual Activity  . Alcohol use: No  . Drug use: Never  . Sexual  activity: Not on file  Other Topics Concern  . Not on file  Social History Narrative  . Not on file   Social Determinants of Health   Financial Resource Strain: Not on file  Food Insecurity: Not on file  Transportation Needs: Not on file  Physical Activity: Not on file  Stress: Not on file  Social Connections: Not on file  Intimate Partner Violence: Not on file      Review of Systems  Constitutional: Negative for chills, diaphoresis and fatigue.  HENT: Negative for ear pain, postnasal drip and sinus pressure.   Eyes: Negative for photophobia, discharge, redness, itching and visual disturbance.  Respiratory: Negative for cough, shortness of breath and wheezing.   Cardiovascular: Negative for chest pain, palpitations and leg swelling.  Gastrointestinal: Negative for abdominal pain, constipation, diarrhea, nausea and vomiting.  Genitourinary: Negative for dysuria and flank pain.  Musculoskeletal: Positive for arthralgias and back pain. Negative for gait problem and neck pain.  Skin: Negative for color change.  Allergic/Immunologic: Negative for environmental allergies and food allergies.  Neurological: Negative for dizziness and headaches.  Hematological: Does not bruise/bleed easily.  Psychiatric/Behavioral: Negative for agitation, behavioral problems (depression) and hallucinations.    Vital Signs: BP (!) 168/70   Pulse 80   Temp 97.8 F (36.6 C)   Resp 16   Ht $R'5\' 3"'Uc$  (1.6 m)   Wt 229 lb 9.6 oz (104.1 kg)   SpO2 94%   BMI 40.67 kg/m    Physical Exam Constitutional:      General: Frances Greene is not in acute distress.    Appearance: Frances Greene is well-developed. Frances Greene is not diaphoretic.  HENT:     Head: Normocephalic and atraumatic.     Mouth/Throat:     Pharynx: No oropharyngeal exudate.  Eyes:     Pupils: Pupils are equal, round, and reactive to light.  Neck:     Thyroid:  No thyromegaly.     Vascular: No JVD.     Trachea: No tracheal deviation.  Cardiovascular:     Rate  and Rhythm: Normal rate and regular rhythm.     Heart sounds: Normal heart sounds. No murmur heard. No friction rub. No gallop.   Pulmonary:     Effort: Pulmonary effort is normal. No respiratory distress.     Breath sounds: No wheezing or rales.  Chest:     Chest wall: No tenderness.  Abdominal:     General: Bowel sounds are normal.     Palpations: Abdomen is soft.  Musculoskeletal:        General: Normal range of motion.     Cervical back: Normal range of motion and neck supple.  Lymphadenopathy:     Cervical: No cervical adenopathy.  Skin:    General: Skin is warm and dry.  Neurological:     Mental Status: Frances Greene is alert and oriented to person, place, and time.     Cranial Nerves: No cranial nerve deficit.  Psychiatric:        Behavior: Behavior normal.        Thought Content: Thought content normal.        Judgment: Judgment normal.        Assessment/Plan: 1. Type 2 diabetes mellitus with stage 3b chronic kidney disease, without long-term current use of insulin (HCC) At target hg A1c, will continue Victoza and Amaryl as before  - POCT HgB A1C  2. Diastolic dysfunction without heart failure Continue on Lasix, losartan  and bisoprolol as before   3. Mixed hyperlipidemia Continue Lipitor   4. Sleep related leg cramps Frances Greene continues to be adherent with her PAP therapy, added Zanaflex prn for leg cramps with good respose  - tizanidine (ZANAFLEX) 2 MG capsule; Take one tab po qhs for spasm  Dispense: 90 capsule; Refill: 1  5. Other age-related cataract of both eyes Pt is scheduled to have Cataract extraction left   6. BMI 40.0-44.9, adult (HCC) Obesity Counseling: Risk Assessment: An assessment of behavioral risk factors was made today and includes lack of exercise sedentary lifestyle, lack of portion control and poor dietary habits.  Risk Modification Advice: Frances Greene was counseled on portion control guidelines. Restricting daily caloric intake to 1500 The detrimental long  term effects of obesity on her health and ongoing poor compliance was also discussed with the patient.    General Counseling: falecia vannatter understanding of the findings of todays visit and agrees with plan of treatment. I have discussed any further diagnostic evaluation that may be needed or ordered today. We also reviewed her medications today. Frances Greene has been encouraged to call the office with any questions or concerns that should arise related to todays visit.    Orders Placed This Encounter  Procedures  . POCT HgB A1C    Meds ordered this encounter  Medications  . tizanidine (ZANAFLEX) 2 MG capsule    Sig: Take one tab po qhs for spasm    Dispense:  90 capsule    Refill:  1    Total time spent: 35 Minutes Time spent includes review of chart, medications, test results, and follow up plan with the patient.   [place] Controlled Substance Database was reviewed by me.   Dr Lavera Guise Internal medicine

## 2020-04-02 LAB — POCT GLYCOSYLATED HEMOGLOBIN (HGB A1C): Hemoglobin A1C: 6 % — AB (ref 4.0–5.6)

## 2020-04-09 ENCOUNTER — Ambulatory Visit: Payer: HMO

## 2020-04-22 ENCOUNTER — Ambulatory Visit: Payer: HMO | Admitting: Internal Medicine

## 2020-04-29 ENCOUNTER — Other Ambulatory Visit: Payer: Self-pay | Admitting: Internal Medicine

## 2020-04-29 DIAGNOSIS — J309 Allergic rhinitis, unspecified: Secondary | ICD-10-CM

## 2020-05-07 ENCOUNTER — Ambulatory Visit: Admit: 2020-05-07 | Payer: No Typology Code available for payment source | Admitting: Ophthalmology

## 2020-05-07 SURGERY — PHACOEMULSIFICATION, CATARACT, WITH IOL INSERTION
Anesthesia: Topical | Laterality: Left

## 2020-05-16 ENCOUNTER — Other Ambulatory Visit: Payer: Self-pay

## 2020-05-16 DIAGNOSIS — E538 Deficiency of other specified B group vitamins: Secondary | ICD-10-CM

## 2020-05-16 DIAGNOSIS — N1832 Chronic kidney disease, stage 3b: Secondary | ICD-10-CM

## 2020-05-16 DIAGNOSIS — D509 Iron deficiency anemia, unspecified: Secondary | ICD-10-CM

## 2020-05-16 DIAGNOSIS — D631 Anemia in chronic kidney disease: Secondary | ICD-10-CM

## 2020-05-21 ENCOUNTER — Other Ambulatory Visit: Payer: Self-pay

## 2020-05-21 ENCOUNTER — Inpatient Hospital Stay: Payer: HMO | Attending: Oncology | Admitting: Oncology

## 2020-05-21 DIAGNOSIS — D631 Anemia in chronic kidney disease: Secondary | ICD-10-CM | POA: Insufficient documentation

## 2020-05-21 DIAGNOSIS — N1832 Chronic kidney disease, stage 3b: Secondary | ICD-10-CM | POA: Insufficient documentation

## 2020-05-21 DIAGNOSIS — E538 Deficiency of other specified B group vitamins: Secondary | ICD-10-CM | POA: Insufficient documentation

## 2020-05-21 DIAGNOSIS — D509 Iron deficiency anemia, unspecified: Secondary | ICD-10-CM

## 2020-05-21 DIAGNOSIS — E611 Iron deficiency: Secondary | ICD-10-CM | POA: Insufficient documentation

## 2020-05-21 LAB — CBC WITH DIFFERENTIAL/PLATELET
Abs Immature Granulocytes: 0.04 10*3/uL (ref 0.00–0.07)
Basophils Absolute: 0 10*3/uL (ref 0.0–0.1)
Basophils Relative: 0 %
Eosinophils Absolute: 0.3 10*3/uL (ref 0.0–0.5)
Eosinophils Relative: 4 %
HCT: 33.2 % — ABNORMAL LOW (ref 36.0–46.0)
Hemoglobin: 10.8 g/dL — ABNORMAL LOW (ref 12.0–15.0)
Immature Granulocytes: 1 %
Lymphocytes Relative: 23 %
Lymphs Abs: 1.4 10*3/uL (ref 0.7–4.0)
MCH: 27.3 pg (ref 26.0–34.0)
MCHC: 32.5 g/dL (ref 30.0–36.0)
MCV: 84.1 fL (ref 80.0–100.0)
Monocytes Absolute: 0.5 10*3/uL (ref 0.1–1.0)
Monocytes Relative: 9 %
Neutro Abs: 3.7 10*3/uL (ref 1.7–7.7)
Neutrophils Relative %: 63 %
Platelets: 150 10*3/uL (ref 150–400)
RBC: 3.95 MIL/uL (ref 3.87–5.11)
RDW: 14 % (ref 11.5–15.5)
WBC: 5.9 10*3/uL (ref 4.0–10.5)
nRBC: 0 % (ref 0.0–0.2)

## 2020-05-21 LAB — FERRITIN: Ferritin: 60 ng/mL (ref 11–307)

## 2020-05-21 LAB — FOLATE: Folate: 72 ng/mL (ref >5.9)

## 2020-05-22 ENCOUNTER — Telehealth: Payer: Self-pay

## 2020-05-22 LAB — VITAMIN B12: Vitamin B-12: 2999 pg/mL — ABNORMAL HIGH (ref 180–914)

## 2020-05-22 NOTE — Progress Notes (Signed)
Frances Greene, would you mind letting this patient know she can decrease her oral B12 supplements to every other day.  Her B12 levels are very high.  We will recheck this in July.  Sonia Baller

## 2020-05-22 NOTE — Telephone Encounter (Signed)
Per Sonia Baller, pts B12 levels are very high. She would like for pt to take oral B12 every other day with the same dosage. Explained to pt to only take oral B12 every other day instead of daily with same dosage. Pt understood and provided scheduled appt details as well.

## 2020-05-28 ENCOUNTER — Other Ambulatory Visit: Payer: Self-pay | Admitting: Internal Medicine

## 2020-05-28 DIAGNOSIS — J309 Allergic rhinitis, unspecified: Secondary | ICD-10-CM

## 2020-06-10 ENCOUNTER — Other Ambulatory Visit: Payer: Self-pay

## 2020-06-10 ENCOUNTER — Other Ambulatory Visit: Payer: Self-pay | Admitting: Internal Medicine

## 2020-06-10 ENCOUNTER — Other Ambulatory Visit: Payer: Self-pay | Admitting: Adult Health

## 2020-06-10 MED ORDER — ALLOPURINOL 300 MG PO TABS: 300.0000 mg | ORAL_TABLET | Freq: Every day | ORAL | 1 refills | Status: DC

## 2020-06-17 ENCOUNTER — Encounter: Payer: Self-pay | Admitting: Internal Medicine

## 2020-06-17 ENCOUNTER — Other Ambulatory Visit: Payer: Self-pay

## 2020-06-17 ENCOUNTER — Ambulatory Visit: Payer: HMO | Admitting: Internal Medicine

## 2020-06-17 VITALS — BP 180/70 | HR 76 | Temp 97.3°F | Resp 16 | Ht 63.0 in | Wt 226.0 lb

## 2020-06-17 DIAGNOSIS — G4733 Obstructive sleep apnea (adult) (pediatric): Secondary | ICD-10-CM | POA: Diagnosis not present

## 2020-06-17 DIAGNOSIS — Z6841 Body Mass Index (BMI) 40.0 and over, adult: Secondary | ICD-10-CM | POA: Diagnosis not present

## 2020-06-17 DIAGNOSIS — J309 Allergic rhinitis, unspecified: Secondary | ICD-10-CM

## 2020-06-17 NOTE — Progress Notes (Signed)
[facility] [place], [place] [address]  Pulmonary Sleep Medicine   Office Visit Note  Patient Name: Frances Greene DOB: Jun 29, 1945 MRN [id number]  Date of Service: 06/17/2020  Complaints/HPI: OSA Allergies. She has noted some congestion. She is noting some cough with sputum production which is clear. As far as OSA she is not using the CPAP because it is not working. She has not had a new machine since the beginning. Patient will need a new machinie. Her original PSG had shown an AHI of 35.1 and has not had a followup to update. She wants to know if she can come off the machin which is likely not. However would need a new study to update  ROS  General: (-) fever, (-) chills, (-) night sweats, (-) weakness Skin: (-) rashes, (-) itching,. Eyes: (-) visual changes, (-) redness, (-) itching. Nose and Sinuses: (-) nasal stuffiness or itchiness, (-) postnasal drip, (-) nosebleeds, (-) sinus trouble. Mouth and Throat: (-) sore throat, (-) hoarseness. Neck: (-) swollen glands, (-) enlarged thyroid, (-) neck pain. Respiratory: - cough, (-) bloody sputum, - shortness of breath, - wheezing. Cardiovascular: - ankle swelling, (-) chest pain. Lymphatic: (-) lymph node enlargement. Neurologic: (-) numbness, (-) tingling. Psychiatric: (-) anxiety, (-) depression   Current Medication: Outpatient Encounter Medications as of 06/17/2020  Medication Sig Note   acetaminophen (TYLENOL) 325 MG tablet Take 650 mg by mouth every 6 (six) hours as needed. 03/12/2020: Prn    allopurinol (ZYLOPRIM) 300 MG tablet Take 1 tablet (300 mg total) by mouth daily.    AMBULATORY NON FORMULARY MEDICATION Medication Name: geni kot natural veggie laxitive    aspirin EC 325 MG tablet Take 325 mg by mouth. 01/23/2016: Received from: [place]: Take 325 mg by mouth.   atorvastatin (LIPITOR) 20 MG tablet Take 1 tablet (20 mg total) by mouth daily.    betamethasone dipropionate  0.05 % cream Apply topically 2 (two) times daily.    bisoprolol-hydrochlorothiazide (ZIAC) 2.5-6.25 MG tablet Take 1 tablet by mouth 2 (two) times daily.    Blood Glucose Calibration (TRUE METRIX LEVEL 1) Low SOLN Use as directed    cinacalcet (SENSIPAR) 30 MG tablet     citalopram (CELEXA) 20 MG tablet TAKE 1 TABLET BY MOUTH ONCE DAILY FOR DEPRESSION    cyanocobalamin 1000 MCG tablet Take 1,000 mcg by mouth daily. Take Monday, Wednesday and Friday    diphenhydrAMINE (BENADRYL) 25 MG tablet Take 25 mg by mouth daily.    furosemide (LASIX) 40 MG tablet TAKE 1 TABLET BY MOUTH ONCE DAILY AND  TAKE  XTRA  TAB  IF  NEEDED    gabapentin (NEURONTIN) 100 MG capsule Take 2 -4 cap at night for numbness in hands    Garlic 4097 MG CAPS Take by mouth. 01/23/2016: Received from: [place]: Take by mouth.   glimepiride (AMARYL) 1 MG tablet Take 1 tablet (1 mg total) by mouth daily.    glucose blood (TRUE METRIX BLOOD GLUCOSE TEST) test strip 1 each by Other route in the morning and at bedtime. Dx E11.65    hydrALAZINE (APRESOLINE) 25 MG tablet TAKE 1 TABLET BY MOUTH THREE TIMES DAILY FOR BLOOD PRESSURE    HYDROcodone-acetaminophen (NORCO/VICODIN) 5-325 MG tablet Take 1 tablet by mouth every 6 (six) hours as needed for moderate pain or severe pain.    Insulin Pen Needle (PEN NEEDLES) 32G X 4 MM MISC 1 Units by Does not  apply route daily. Use with victoza injections daily.    ketoconazole (NIZORAL) 2 % shampoo SHAMPOO AS DIRECTED    latanoprost (XALATAN) 0.005 % ophthalmic solution latanoprost 0.005 % eye drops  INSTILL 1 DROP INTO EACH EYE IN THE EVENING    linaclotide (LINZESS) 145 MCG CAPS capsule Take 1 capsule (145 mcg total) by mouth daily before breakfast.    liraglutide (VICTOZA) 18 MG/3ML SOPN INJECT 1.8 MG ONCE DAILY SUBCUTANEOUSLY    losartan (COZAAR) 25 MG tablet Take 25 mg by mouth daily. Patient takes 1/2 tab daily    meloxicam (MOBIC) 7.5 MG tablet TAKE 1 TABLET BY  MOUTH ONCE DAILY FOR  GOUT  AND  ARTHRITIS    montelukast (SINGULAIR) 10 MG tablet Take 1 tablet by mouth once daily    Omega-3 Fatty Acids (FISH OIL PO) Take by mouth. 01/23/2016: Received from: [place]: Take 1 capsule by mouth 2 (two) times daily.   polyethylene glycol (MIRALAX / GLYCOLAX) packet Take 17 g by mouth daily as needed.    senna (SENOKOT) 8.6 MG TABS tablet Take 1 tablet by mouth.    Sulfacetamide Sodium (SODIUM SULFACETAMIDE) 10 % SHAM Apply topically.    tizanidine (ZANAFLEX) 2 MG capsule Take one tab po qhs for spasm    TRUEplus Lancets 33G MISC TEST BLOOD SUGAR IN THE MORNING AND AT BEDTIME    Vitamin D, Ergocalciferol, (DRISDOL) 50000 units CAPS capsule  01/23/2016: Received from: [place]   No facility-administered encounter medications on file as of 06/17/2020.    Surgical History: Past Surgical History:  Procedure Laterality Date   ABDOMINAL HYSTERECTOMY     BREAST BIOPSY Left 1989   neg   CATARACT EXTRACTION W/PHACO Right 05/21/2015   Procedure: CATARACT EXTRACTION PHACO AND INTRAOCULAR LENS PLACEMENT (IOC);  Surgeon: Milus Height, MD;  Location: [facility] ORS;  Service: Ophthalmology;  Laterality: Right;  [place]: AP%: 10.0 CDE: 9.53   COLONOSCOPY     JOINT REPLACEMENT Bilateral    knee   PARS PLANA VITRECTOMY Right 05/21/2015   Procedure: PARS PLANA VITRECTOMY WITH 25 GAUGE;  Surgeon: Milus Height, MD;  Location: [facility] ORS;  Service: Ophthalmology;  Laterality: Right;  Lot # [id number] Endo laser: [place]: 300 Pulse Duration: 200 Total Pulses:143    shoulder surgery      Medical History: Past Medical History:  Diagnosis Date   Arthritis    Bronchitis    CHF (congestive heart failure) (HCC)    Chronic kidney disease    decreased kidney function   Diabetes mellitus without complication (HCC)    Gout    Hypertension    Multiple allergies    Shortness of breath dyspnea    w/ exertion   Sleep apnea    CPAP    Swelling of both lower extremities    Wheezing     Family History: Family History  Problem Relation Age of Onset   Breast cancer Cousin    Breast cancer Other    Hypertension Mother    Diabetes Mother    Hypertension Father    Diabetes Father    Alcohol abuse Father    Aneurysm Sister    Alcohol abuse Sister    Stroke Sister        died from stroke   Hepatitis Brother    Hypertension Sister        dialysis   Diabetes Sister    Alcoholism Brother    Alcoholism Brother  dies from head injury from a fall.  not an alcoholic,   Cancer Brother    Diabetes Brother    Hypertension Brother    Diabetes Brother    Cancer Brother    Hypertension Brother    Stroke Brother    Alcoholism Brother     Social History: Social History   Socioeconomic History   Marital status: Divorced    Spouse name: Not on file   Number of children: Not on file   Years of education: Not on file   Highest education level: Not on file  Occupational History   Not on file  Tobacco Use   Smoking status: Former Smoker    Types: Cigarettes   Smokeless tobacco: Never Used   Tobacco comment: 4 cigarettes quit 20 years ago  Scientific laboratory technician Use: Never used  Substance and Sexual Activity   Alcohol use: No   Drug use: Never   Sexual activity: Not on file  Other Topics Concern   Not on file  Social History Narrative   Not on file   Social Determinants of Health   Financial Resource Strain: Not on file  Food Insecurity: Not on file  Transportation Needs: Not on file  Physical Activity: Not on file  Stress: Not on file  Social Connections: Not on file  Intimate Partner Violence: Not on file    Vital Signs: Blood pressure (!) 180/70, pulse 76, temperature (!) 97.3 F (36.3 C), resp. rate 16, height _0  (1.6 m), weight 226 lb (102.5 kg), SpO2 97 %.  Examination: General Appearance: The patient is well-developed, well-nourished, and in no distress. Skin: Gross inspection of skin  unremarkable. Head: normocephalic, no gross deformities. Eyes: no gross deformities noted. ENT: ears appear grossly normal no exudates. Neck: Supple. No thyromegaly. No LAD. Respiratory: no rhonchi noted at this time. Cardiovascular: Normal S1 and S2 without murmur or rub. Extremities: No cyanosis. pulses are equal. Neurologic: Alert and oriented. No involuntary movements.  LABS: Recent Results (from the past 2160 hour(s))  CBC with Differential/Platelet     Status: Abnormal   Collection Time: 03/26/20  3:17 PM  Result Value Ref Range   WBC 5.3 3.4 - 10.8 x10E3/uL   RBC 3.98 3.77 - 5.28 x10E6/uL   Hemoglobin 11.0 (L) 11.1 - 15.9 g/dL   Hematocrit 33.1 (L) 34.0 - 46.6 %   MCV 83 79 - 97 fL   MCH 27.6 26.6 - 33.0 pg   MCHC 33.2 31.5 - 35.7 g/dL   RDW 13.4 11.7 - 15.4 %   Platelets 151 150 - 450 x10E3/uL   Neutrophils 66 Not Estab. %   Lymphs 22 Not Estab. %   Monocytes 8 Not Estab. %   Eos 4 Not Estab. %   Basos 0 Not Estab. %   Neutrophils Absolute 3.4 1.4 - 7.0 x10E3/uL   Lymphocytes Absolute 1.2 0.7 - 3.1 x10E3/uL   Monocytes Absolute 0.4 0.1 - 0.9 x10E3/uL   EOS (ABSOLUTE) 0.2 0.0 - 0.4 x10E3/uL   Basophils Absolute 0.0 0.0 - 0.2 x10E3/uL   Immature Granulocytes 0 Not Estab. %   Immature Grans (Abs) 0.0 0.0 - 0.1 x10E3/uL  Comprehensive metabolic panel     Status: Abnormal   Collection Time: 03/26/20  3:17 PM  Result Value Ref Range   Glucose 109 (H) 65 - 99 mg/dL   BUN 32 (H) 8 - 27 mg/dL   Creatinine, Ser 2.25 (H) 0.57 - 1.00 mg/dL   eGFR 22 (  L) >59 mL/min/1.73   BUN/Creatinine Ratio 14 12 - 28   Sodium 143 134 - 144 mmol/L   Potassium 3.9 3.5 - 5.2 mmol/L   Chloride 105 96 - 106 mmol/L   CO2 20 20 - 29 mmol/L   Calcium 8.7 8.7 - 10.3 mg/dL   Total Protein 6.3 6.0 - 8.5 g/dL   Albumin 4.4 3.7 - 4.7 g/dL   Globulin, Total 1.9 1.5 - 4.5 g/dL   Albumin/Globulin Ratio 2.3 (H) 1.2 - 2.2   Bilirubin Total 0.4 0.0 - 1.2 mg/dL   Alkaline Phosphatase 72 44 - 121 IU/L    AST 13 0 - 40 IU/L   ALT 8 0 - 32 IU/L  POCT HgB A1C     Status: Abnormal   Collection Time: 04/02/20  9:26 AM  Result Value Ref Range   Hemoglobin A1C 6.0 (A) 4.0 - 5.6 %   HbA1c POC (<> result, manual entry)     HbA1c, POC (prediabetic range)     HbA1c, POC (controlled diabetic range)    Folate     Status: None   Collection Time: 05/21/20  2:45 PM  Result Value Ref Range   Folate 72.0 >5.9 ng/mL    Comment: RESULT CONFIRMED BY MANUAL DILUTION RH Performed at [facility], [address] [place], [place] [address]   Vitamin B12     Status: Abnormal   Collection Time: 05/21/20  2:45 PM  Result Value Ref Range   Vitamin B-12 2,999 (H) 180 - 914 pg/mL    Comment: (NOTE) This assay is not validated for testing neonatal or myeloproliferative syndrome specimens for Vitamin B12 levels. Performed at [facility], [place] [address] [place], [place] [address]   Ferritin     Status: None   Collection Time: 05/21/20  2:45 PM  Result Value Ref Range   Ferritin 60 11 - 307 ng/mL    Comment: Performed at [facility], [address] [place], [place] [address]  CBC with Differential     Status: Abnormal   Collection Time: 05/21/20  2:45 PM  Result Value Ref Range   WBC 5.9 4.0 - 10.5 K/uL   RBC 3.95 3.87 - 5.11 MIL/uL   Hemoglobin 10.8 (L) 12.0 - 15.0 g/dL   HCT 33.2 (L) 36.0 - 46.0 %   MCV 84.1 80.0 - 100.0 fL   MCH 27.3 26.0 - 34.0 pg   MCHC 32.5 30.0 - 36.0 g/dL   RDW 14.0 11.5 - 15.5 %   Platelets 150 150 - 400 K/uL   nRBC 0.0 0.0 - 0.2 %   Neutrophils Relative % 63 %   Neutro Abs 3.7 1.7 - 7.7 K/uL   Lymphocytes Relative 23 %   Lymphs Abs 1.4 0.7 - 4.0 K/uL   Monocytes Relative 9 %   Monocytes Absolute 0.5 0.1 - 1.0 K/uL   Eosinophils Relative 4 %   Eosinophils Absolute 0.3 0.0 - 0.5 K/uL   Basophils Relative 0 %   Basophils Absolute 0.0 0.0 - 0.1 K/uL   Immature Granulocytes 1 %   Abs Immature Granulocytes 0.04 0.00 - 0.07 K/uL    Comment:  Performed at [facility], [address] [place], [place] [address]    Radiology: MM 3D SCREEN BREAST BILATERAL  Result Date: 10/05/2019 CLINICAL DATA:  Screening. EXAM: DIGITAL SCREENING BILATERAL MAMMOGRAM WITH TOMO AND CAD COMPARISON:  Previous exam(s). ACR Breast Density Category b: There are scattered areas of fibroglandular density.  FINDINGS: There are no findings suspicious for malignancy. Images were processed with CAD. IMPRESSION: No mammographic evidence of malignancy. A result letter of this screening mammogram will be mailed directly to the patient. RECOMMENDATION: Screening mammogram in one year. (Code:SM-B-01Y) BI-RADS CATEGORY  1: Negative. Electronically Signed   By: Nolon Nations M.D.   On: 10/05/2019 13:06    No results found.  No results found.    Assessment and Plan: Patient Active Problem List   Diagnosis Date Noted   B12 deficiency 03/19/2020   Anemia in stage 3b chronic kidney disease ([place]) 03/19/2020   Acute bilateral low back pain with right-sided sciatica 11/12/2018   Closed fracture of right proximal humerus 11/12/2018   Type 2 diabetes mellitus with hyperglycemia ([place]) 10/28/2018   Flu vaccine need 10/28/2018   Pain due to onychomycosis of toenails of both feet 07/17/2018   Type 2 diabetes mellitus with vascular disease ([place]) 07/17/2018   Other fatigue 11/14/2017   Moderate episode of recurrent major depressive disorder ([place]) 11/14/2017   Chronic allergic rhinitis 11/14/2017   Primary insomnia 11/14/2017   Uncontrolled type 2 diabetes mellitus with hyperglycemia ([place]) 09/18/2017   Acute pain of right shoulder 09/18/2017   Type 2 diabetes mellitus with chronic kidney disease ([place]) 01/19/2017   Anosmia 01/19/2017   Cervicalgia 01/19/2017   Vitamin D deficiency 01/19/2017   Hypoglycemia 01/19/2017   Obstructive sleep apnea 01/19/2017   Essential hypertension, benign 01/19/2017   Mixed hyperlipidemia 01/19/2017   Anemia, unspecified  01/19/2017   Edema 01/19/2017   SOB (shortness of breath) 01/19/2017   Gout 01/19/2017   Morbid obesity ([place]) 01/19/2017   Stage 3 chronic kidney disease ([place]) 01/19/2017   Hypercalcemia 01/19/2017   Iron deficiency anemia 01/14/2016   Acute pain of right knee 04/23/2015   Left wrist pain 08/12/2014   Status post bilateral unicompartmental knee replacement 11/13/2013   Arthritis, senescent 08/02/2013    1. Obstructive sleep apnea Patient needs to have a follow-up on sleep study done because of increased symptoms of hypersomnia.  She has not been using her CPAP device her machine currently is nonfunctional.  Her last AHI on the previous study that was done was 35.1 and we need to reassess severity and get her back on appropriate treatment. - PSG Sleep Study; Future  2. Chronic allergic rhinitis Nasal steroids as indicated.  She has waxing waning symptoms seem to respond to antihistamines also  3. BMI 40.0-44.9, adult [facility]) Patient has severe obesity.  Talked her about diet exercise.  She needs to work on weight loss try to get back to her exercise regimen and dietary control.  Obesity Counseling: Had a lengthy discussion regarding patients BMI and weight issues. Patient was instructed on portion control as well as increased activity. Also discussed caloric restrictions with trying to maintain intake less than 2000 Kcal. Discussions were made in accordance with the 5As of weight management. Simple actions such as not eating late and if able to, taking a walk is suggested.   General Counseling: I have discussed the findings of the evaluation and examination with Frances Greene.  I have also discussed any further diagnostic evaluation thatmay be needed or ordered today. Frances Greene verbalizes understanding of the findings of todays visit. We also reviewed her medications today and discussed drug interactions and side effects including but not limited excessive drowsiness and altered mental states. We  also discussed that there is always a risk not just to her but also people around her. she has been encouraged  to call the office with any questions or concerns that should arise related to todays visit.  No orders of the defined types were placed in this encounter.    Time spent: 23  I have personally obtained a history, examined the patient, evaluated laboratory and imaging results, formulated the assessment and plan and placed orders.    Allyne Gee, MD [facility] Pulmonary and Critical Care Sleep medicine

## 2020-06-24 DIAGNOSIS — I10 Essential (primary) hypertension: Secondary | ICD-10-CM | POA: Diagnosis not present

## 2020-06-24 DIAGNOSIS — N1832 Chronic kidney disease, stage 3b: Secondary | ICD-10-CM | POA: Diagnosis not present

## 2020-06-24 DIAGNOSIS — E1122 Type 2 diabetes mellitus with diabetic chronic kidney disease: Secondary | ICD-10-CM | POA: Diagnosis not present

## 2020-06-24 DIAGNOSIS — E210 Primary hyperparathyroidism: Secondary | ICD-10-CM | POA: Diagnosis not present

## 2020-06-24 DIAGNOSIS — D631 Anemia in chronic kidney disease: Secondary | ICD-10-CM | POA: Diagnosis not present

## 2020-07-09 ENCOUNTER — Encounter: Payer: Self-pay | Admitting: Nurse Practitioner

## 2020-07-09 ENCOUNTER — Ambulatory Visit (INDEPENDENT_AMBULATORY_CARE_PROVIDER_SITE_OTHER): Payer: HMO | Admitting: Nurse Practitioner

## 2020-07-09 VITALS — Ht 62.0 in | Wt 226.0 lb

## 2020-07-09 DIAGNOSIS — J45909 Unspecified asthma, uncomplicated: Secondary | ICD-10-CM

## 2020-07-09 DIAGNOSIS — R058 Other specified cough: Secondary | ICD-10-CM | POA: Diagnosis not present

## 2020-07-09 DIAGNOSIS — R0981 Nasal congestion: Secondary | ICD-10-CM | POA: Diagnosis not present

## 2020-07-09 DIAGNOSIS — J209 Acute bronchitis, unspecified: Secondary | ICD-10-CM

## 2020-07-09 MED ORDER — FLUTICASONE-SALMETEROL 100-50 MCG/ACT IN AEPB: 1.0000 | INHALATION_SPRAY | Freq: Two times a day (BID) | RESPIRATORY_TRACT | 3 refills | Status: DC

## 2020-07-09 MED ORDER — AZITHROMYCIN 250 MG PO TABS: ORAL_TABLET | ORAL | 0 refills | Status: DC

## 2020-07-09 NOTE — Progress Notes (Signed)
[facility] [place], [place] [address]  Internal MEDICINE  Telephone Visit  Patient Name: Frances Greene  [id number]  [id number]  Date of Service: 07/18/2020  I connected with the patient at 11:40 AM by telephone and verified the patients identity using two identifiers.   I discussed the limitations, risks, security and privacy concerns of performing an evaluation and management service by telephone and the availability of in person appointments. I also discussed with the patient that there may be a patient responsible charge related to the service.  The patient expressed understanding and agrees to proceed.    Chief Complaint  Patient presents with   Acute Home Visit    Pt taking a cold a flu medication, pt has been taking for a week, and symptoms has not gotten better   Telephone Assessment   Telephone Screen    [phone] virtual    Cough   Asthma   Diabetes    HPI Susi presents via virtual visit with cough x1 week accompanied by SOB, wheezing, chest tightness, nasal congestion, headaches, and sneezing. She has been using cold and flu OTC medications specifically coricidin due to high blood pressure. She denies chest pain, sore throat, fatigue, fever, chills, or decreased appetite. Symptoms have not improved after a week of OTC medications.    Current Medication: Outpatient Encounter Medications as of 07/09/2020  Medication Sig Note   acetaminophen (TYLENOL) 325 MG tablet Take 650 mg by mouth every 6 (six) hours as needed. 03/12/2020: Prn    allopurinol (ZYLOPRIM) 300 MG tablet Take 1 tablet (300 mg total) by mouth daily.    AMBULATORY NON FORMULARY MEDICATION Medication Name: geni kot natural veggie laxitive    aspirin EC 325 MG tablet Take 325 mg by mouth. 01/23/2016: Received from: [place]: Take 325 mg by mouth.   atorvastatin (LIPITOR) 20 MG tablet Take 1 tablet (20 mg total) by mouth daily.    azithromycin (ZITHROMAX) 250  MG tablet Take one tab a day for 10 days for uri    betamethasone dipropionate 0.05 % cream Apply topically 2 (two) times daily.    bisoprolol-hydrochlorothiazide (ZIAC) 2.5-6.25 MG tablet Take 1 tablet by mouth 2 (two) times daily.    Blood Glucose Calibration (TRUE METRIX LEVEL 1) Low SOLN Use as directed    cinacalcet (SENSIPAR) 30 MG tablet     citalopram (CELEXA) 20 MG tablet TAKE 1 TABLET BY MOUTH ONCE DAILY FOR DEPRESSION    cyanocobalamin 1000 MCG tablet Take 1,000 mcg by mouth daily. Take Monday, Wednesday and Friday    diphenhydrAMINE (BENADRYL) 25 MG tablet Take 25 mg by mouth daily.    fluticasone-salmeterol (ADVAIR) 100-50 MCG/ACT AEPB Inhale 1 puff into the lungs 2 (two) times daily.    furosemide (LASIX) 40 MG tablet TAKE 1 TABLET BY MOUTH ONCE DAILY AND  TAKE  XTRA  TAB  IF  NEEDED    gabapentin (NEURONTIN) 100 MG capsule Take 2 -4 cap at night for numbness in hands    Garlic 0258 MG CAPS Take by mouth. 01/23/2016: Received from: [place]: Take by mouth.   glimepiride (AMARYL) 1 MG tablet Take 1 tablet (1 mg total) by mouth daily.    glucose blood (TRUE METRIX BLOOD GLUCOSE TEST) test strip 1 each by Other route in the morning and at bedtime. Dx E11.65    hydrALAZINE (APRESOLINE) 25 MG tablet TAKE 1 TABLET BY MOUTH THREE TIMES DAILY FOR BLOOD  PRESSURE    HYDROcodone-acetaminophen (NORCO/VICODIN) 5-325 MG tablet Take 1 tablet by mouth every 6 (six) hours as needed for moderate pain or severe pain.    Insulin Pen Needle (PEN NEEDLES) 32G X 4 MM MISC 1 Units by Does not apply route daily. Use with victoza injections daily.    ketoconazole (NIZORAL) 2 % shampoo SHAMPOO AS DIRECTED    latanoprost (XALATAN) 0.005 % ophthalmic solution latanoprost 0.005 % eye drops  INSTILL 1 DROP INTO EACH EYE IN THE EVENING    linaclotide (LINZESS) 145 MCG CAPS capsule Take 1 capsule (145 mcg total) by mouth daily before breakfast.    liraglutide (VICTOZA) 18 MG/3ML  SOPN INJECT 1.8 MG ONCE DAILY SUBCUTANEOUSLY    losartan (COZAAR) 25 MG tablet Take 25 mg by mouth daily. Patient takes 1/2 tab daily    meloxicam (MOBIC) 7.5 MG tablet TAKE 1 TABLET BY MOUTH ONCE DAILY FOR  GOUT  AND  ARTHRITIS    montelukast (SINGULAIR) 10 MG tablet Take 1 tablet by mouth once daily    Omega-3 Fatty Acids (FISH OIL PO) Take by mouth. 01/23/2016: Received from: [place]: Take 1 capsule by mouth 2 (two) times daily.   polyethylene glycol (MIRALAX / GLYCOLAX) packet Take 17 g by mouth daily as needed.    senna (SENOKOT) 8.6 MG TABS tablet Take 1 tablet by mouth.    Sulfacetamide Sodium (SODIUM SULFACETAMIDE) 10 % SHAM Apply topically.    tizanidine (ZANAFLEX) 2 MG capsule Take one tab po qhs for spasm    TRUEplus Lancets 33G MISC TEST BLOOD SUGAR IN THE MORNING AND AT BEDTIME    Vitamin D, Ergocalciferol, (DRISDOL) 50000 units CAPS capsule  01/23/2016: Received from: [place]   No facility-administered encounter medications on file as of 07/09/2020.    Surgical History: Past Surgical History:  Procedure Laterality Date   ABDOMINAL HYSTERECTOMY     BREAST BIOPSY Left 1989   neg   CATARACT EXTRACTION W/PHACO Right 05/21/2015   Procedure: CATARACT EXTRACTION PHACO AND INTRAOCULAR LENS PLACEMENT (IOC);  Surgeon: Milus Height, MD;  Location: [facility] ORS;  Service: Ophthalmology;  Laterality: Right;  [place]: AP%: 10.0 CDE: 9.53   COLONOSCOPY     JOINT REPLACEMENT Bilateral    knee   PARS PLANA VITRECTOMY Right 05/21/2015   Procedure: PARS PLANA VITRECTOMY WITH 25 GAUGE;  Surgeon: Milus Height, MD;  Location: [facility] ORS;  Service: Ophthalmology;  Laterality: Right;  Lot # [id number] Endo laser: Watts: 300 Pulse Duration: 200 Total Pulses:143    shoulder surgery      Medical History: Past Medical History:  Diagnosis Date   Arthritis    Bronchitis    CHF (congestive heart failure) (HCC)    Chronic kidney disease    decreased  kidney function   Diabetes mellitus without complication (HCC)    Gout    Hypertension    Multiple allergies    Shortness of breath dyspnea    w/ exertion   Sleep apnea    CPAP   Swelling of both lower extremities    Wheezing     Family History: Family History  Problem Relation Age of Onset   Breast cancer Cousin    Breast cancer Other    Hypertension Mother    Diabetes Mother    Hypertension Father    Diabetes Father    Alcohol abuse Father    Aneurysm Sister    Alcohol abuse Sister    Stroke Sister  died from stroke   Hepatitis Brother    Hypertension Sister        dialysis   Diabetes Sister    Alcoholism Brother    Alcoholism Brother        dies from head injury from a fall.  not an alcoholic,   Cancer Brother    Diabetes Brother    Hypertension Brother    Diabetes Brother    Cancer Brother    Hypertension Brother    Stroke Brother    Alcoholism Brother     Social History   Socioeconomic History   Marital status: Divorced    Spouse name: Not on file   Number of children: Not on file   Years of education: Not on file   Highest education level: Not on file  Occupational History   Not on file  Tobacco Use   Smoking status: Former    Pack years: 0.00    Types: Cigarettes   Smokeless tobacco: Never   Tobacco comments:    4 cigarettes quit 20 years ago  Scientific laboratory technician Use: Never used  Substance and Sexual Activity   Alcohol use: No   Drug use: Never   Sexual activity: Not on file  Other Topics Concern   Not on file  Social History Narrative   Not on file   Social Determinants of Health   Financial Resource Strain: Not on file  Food Insecurity: Not on file  Transportation Needs: Not on file  Physical Activity: Not on file  Stress: Not on file  Social Connections: Not on file  Intimate Partner Violence: Not on file      Review of Systems  Constitutional:  Negative for appetite change, chills, fatigue and fever.  HENT:   Positive for congestion, rhinorrhea and sneezing. Negative for postnasal drip and sore throat.   Respiratory:  Positive for cough, chest tightness, shortness of breath and wheezing.   Cardiovascular:  Negative for chest pain.  Gastrointestinal:  Negative for abdominal pain, constipation, diarrhea, nausea and vomiting.  Musculoskeletal:  Negative for myalgias.  Neurological:  Positive for headaches.   Vital Signs: Ht 5\' 2"  (1.575 m)   Wt 226 lb (102.5 kg)   BMI 41.34 kg/m    Observation/Objective: She is alert and oriented, does not seem to be in any acute distress. She sounds congested and is coughing throughout the visit.     Assessment/Plan: 1. Bronchitis with asthma, acute Azithromycin ordered, advair refilled - azithromycin (ZITHROMAX) 250 MG tablet; Take one tab a day for 10 days for uri  Dispense: 10 tablet; Refill: 0 - fluticasone-salmeterol (ADVAIR) 100-50 MCG/ACT AEPB; Inhale 1 puff into the lungs 2 (two) times daily.  Dispense: 1 each; Refill: 3  2. Cough with congestion of paranasal sinus Continue using coricidin OTC for symptomatic relief.    General Counseling: jazzmin newbold understanding of the findings of today's phone visit and agrees with plan of treatment. I have discussed any further diagnostic evaluation that may be needed or ordered today. We also reviewed her medications today. she has been encouraged to call the office with any questions or concerns that should arise related to todays visit.    No orders of the defined types were placed in this encounter.   Meds ordered this encounter  Medications   azithromycin (ZITHROMAX) 250 MG tablet    Sig: Take one tab a day for 10 days for uri    Dispense:  10 tablet    Refill:  0   fluticasone-salmeterol (ADVAIR) 100-50 MCG/ACT AEPB    Sig: Inhale 1 puff into the lungs 2 (two) times daily.    Dispense:  1 each    Refill:  3    Time spent:30 Minutes  Return for F/U previously scheduled in august  2022 , [place] PCP.  This patient was seen by Jonetta Osgood, FNP-C in collaboration with Dr. Clayborn Bigness as a part of collaborative care agreement.   Issacc Merlo R. Valetta Fuller, MSN, FNP-C Internal medicine

## 2020-07-28 ENCOUNTER — Other Ambulatory Visit: Payer: Self-pay | Admitting: *Deleted

## 2020-07-28 DIAGNOSIS — D631 Anemia in chronic kidney disease: Secondary | ICD-10-CM

## 2020-07-28 DIAGNOSIS — N1832 Chronic kidney disease, stage 3b: Secondary | ICD-10-CM

## 2020-07-30 ENCOUNTER — Other Ambulatory Visit: Payer: HMO

## 2020-07-30 ENCOUNTER — Inpatient Hospital Stay (HOSPITAL_BASED_OUTPATIENT_CLINIC_OR_DEPARTMENT_OTHER): Payer: HMO | Admitting: Oncology

## 2020-07-30 ENCOUNTER — Inpatient Hospital Stay: Payer: HMO | Attending: Oncology

## 2020-07-30 ENCOUNTER — Ambulatory Visit: Payer: HMO | Admitting: Oncology

## 2020-07-30 ENCOUNTER — Encounter: Payer: Self-pay | Admitting: Oncology

## 2020-07-30 ENCOUNTER — Ambulatory Visit: Payer: HMO

## 2020-07-30 ENCOUNTER — Other Ambulatory Visit: Payer: Self-pay

## 2020-07-30 ENCOUNTER — Inpatient Hospital Stay: Payer: HMO

## 2020-07-30 VITALS — BP 147/63 | HR 72 | Temp 97.5°F | Resp 18 | Wt 226.2 lb

## 2020-07-30 DIAGNOSIS — Z803 Family history of malignant neoplasm of breast: Secondary | ICD-10-CM | POA: Diagnosis not present

## 2020-07-30 DIAGNOSIS — Z79899 Other long term (current) drug therapy: Secondary | ICD-10-CM | POA: Insufficient documentation

## 2020-07-30 DIAGNOSIS — D509 Iron deficiency anemia, unspecified: Secondary | ICD-10-CM

## 2020-07-30 DIAGNOSIS — N1832 Chronic kidney disease, stage 3b: Secondary | ICD-10-CM | POA: Diagnosis not present

## 2020-07-30 DIAGNOSIS — I130 Hypertensive heart and chronic kidney disease with heart failure and stage 1 through stage 4 chronic kidney disease, or unspecified chronic kidney disease: Secondary | ICD-10-CM | POA: Insufficient documentation

## 2020-07-30 DIAGNOSIS — M109 Gout, unspecified: Secondary | ICD-10-CM | POA: Diagnosis not present

## 2020-07-30 DIAGNOSIS — I509 Heart failure, unspecified: Secondary | ICD-10-CM | POA: Insufficient documentation

## 2020-07-30 DIAGNOSIS — D631 Anemia in chronic kidney disease: Secondary | ICD-10-CM | POA: Diagnosis not present

## 2020-07-30 DIAGNOSIS — G4730 Sleep apnea, unspecified: Secondary | ICD-10-CM | POA: Diagnosis not present

## 2020-07-30 DIAGNOSIS — Z7984 Long term (current) use of oral hypoglycemic drugs: Secondary | ICD-10-CM | POA: Insufficient documentation

## 2020-07-30 DIAGNOSIS — E119 Type 2 diabetes mellitus without complications: Secondary | ICD-10-CM | POA: Insufficient documentation

## 2020-07-30 DIAGNOSIS — N183 Chronic kidney disease, stage 3 unspecified: Secondary | ICD-10-CM | POA: Insufficient documentation

## 2020-07-30 LAB — CBC WITH DIFFERENTIAL/PLATELET
Abs Immature Granulocytes: 0.02 10*3/uL (ref 0.00–0.07)
Basophils Absolute: 0 10*3/uL (ref 0.0–0.1)
Basophils Relative: 0 %
Eosinophils Absolute: 0.3 10*3/uL (ref 0.0–0.5)
Eosinophils Relative: 5 %
HCT: 32.7 % — ABNORMAL LOW (ref 36.0–46.0)
Hemoglobin: 10.6 g/dL — ABNORMAL LOW (ref 12.0–15.0)
Immature Granulocytes: 0 %
Lymphocytes Relative: 17 %
Lymphs Abs: 1 10*3/uL (ref 0.7–4.0)
MCH: 28 pg (ref 26.0–34.0)
MCHC: 32.4 g/dL (ref 30.0–36.0)
MCV: 86.5 fL (ref 80.0–100.0)
Monocytes Absolute: 0.6 10*3/uL (ref 0.1–1.0)
Monocytes Relative: 10 %
Neutro Abs: 4.2 10*3/uL (ref 1.7–7.7)
Neutrophils Relative %: 68 %
Platelets: 151 10*3/uL (ref 150–400)
RBC: 3.78 MIL/uL — ABNORMAL LOW (ref 3.87–5.11)
RDW: 13.8 % (ref 11.5–15.5)
WBC: 6.2 10*3/uL (ref 4.0–10.5)
nRBC: 0 % (ref 0.0–0.2)

## 2020-07-30 LAB — FERRITIN: Ferritin: 69 ng/mL (ref 11–307)

## 2020-07-30 NOTE — Progress Notes (Signed)
Hematology/Oncology Consult note [facility]  Telephone:([phone] [phone]  Patient Care Team: Lavera Guise, MD as PCP - General (Internal Medicine)   Name of the patient: Frances Greene  [id number]  25-Jun-1945   Date of visit: 07/30/20  Diagnosis-anemia of chronic kidney disease  Chief complaint/ Reason for visit-routine follow-up of anemia of chronic kidney disease  Heme/Onc history: Patient is a 75 year old female with a past medical history significant for stage III CKD, CHF, hypertension, sleep apnea among other medical problems.  She has a history of anemia of chronic kidney disease but has not required any Retacrit yet.  Hemoglobin has remained stable around 10 since 2020.  No overt evidence of iron deficiency and she has not required any IV iron in the past.  She is on oral B12 for B12 deficiency.  Interval history-patient reports doing well overall and feels at her baseline state of health.  She has some fatigue but denies other complaints at this time.  Denies any bleeding in her stool or urine.  Denies any dark melanotic stools  ECOG PS- 1 Pain scale- 0   Review of systems- Review of Systems  Constitutional:  Positive for malaise/fatigue. Negative for chills, fever and weight loss.  HENT:  Negative for congestion, ear discharge and nosebleeds.   Eyes:  Negative for blurred vision.  Respiratory:  Negative for cough, hemoptysis, sputum production, shortness of breath and wheezing.   Cardiovascular:  Negative for chest pain, palpitations, orthopnea and claudication.  Gastrointestinal:  Negative for abdominal pain, blood in stool, constipation, diarrhea, heartburn, melena, nausea and vomiting.  Genitourinary:  Negative for dysuria, flank pain, frequency, hematuria and urgency.  Musculoskeletal:  Negative for back pain, joint pain and myalgias.  Skin:  Negative for rash.  Neurological:  Negative for dizziness, tingling, focal  weakness, seizures, weakness and headaches.  Endo/Heme/Allergies:  Does not bruise/bleed easily.  Psychiatric/Behavioral:  Negative for depression and suicidal ideas. The patient does not have insomnia.       Allergies  Allergen Reactions   Ramipril Cough and Other (See Comments)    Other reaction(s): Cough Other reaction(s): Cough Other reaction(s): Other (See Comments) Other reaction(s): Cough Other reaction(s): Cough Other reaction(s): Cough      Past Medical History:  Diagnosis Date   Arthritis    Bronchitis    CHF (congestive heart failure) ([place])    Chronic kidney disease    decreased kidney function   Diabetes mellitus without complication (HCC)    Gout    Hypertension    Multiple allergies    Shortness of breath dyspnea    w/ exertion   Sleep apnea    CPAP   Swelling of both lower extremities    Wheezing      Past Surgical History:  Procedure Laterality Date   ABDOMINAL HYSTERECTOMY     BREAST BIOPSY Left 1989   neg   CATARACT EXTRACTION W/PHACO Right 05/21/2015   Procedure: CATARACT EXTRACTION PHACO AND INTRAOCULAR LENS PLACEMENT ([place]);  Surgeon: Milus Height, MD;  Location: [facility] ORS;  Service: Ophthalmology;  Laterality: Right;  [place]: AP%: 10.0 CDE: 9.53   COLONOSCOPY     JOINT REPLACEMENT Bilateral    knee   PARS PLANA VITRECTOMY Right 05/21/2015   Procedure: PARS PLANA VITRECTOMY WITH 25 GAUGE;  Surgeon: Milus Height, MD;  Location: [facility] ORS;  Service: Ophthalmology;  Laterality: Right;  Lot # [id number] H Endo laser: Watts: 300 Pulse Duration: 200 Total Pulses:143    shoulder  surgery      Social History   Socioeconomic History   Marital status: Divorced    Spouse name: Not on file   Number of children: Not on file   Years of education: Not on file   Highest education level: Not on file  Occupational History   Not on file  Tobacco Use   Smoking status: Former    Pack years: 0.00    Types: Cigarettes   Smokeless tobacco: Never   Tobacco  comments:    4 cigarettes quit 20 years ago  Vaping Use   Vaping Use: Never used  Substance and Sexual Activity   Alcohol use: No   Drug use: Never   Sexual activity: Not on file  Other Topics Concern   Not on file  Social History Narrative   Not on file   Social Determinants of Health   Financial Resource Strain: Not on file  Food Insecurity: Not on file  Transportation Needs: Not on file  Physical Activity: Not on file  Stress: Not on file  Social Connections: Not on file  Intimate Partner Violence: Not on file    Family History  Problem Relation Age of Onset   Breast cancer Cousin    Breast cancer Other    Hypertension Mother    Diabetes Mother    Hypertension Father    Diabetes Father    Alcohol abuse Father    Aneurysm Sister    Alcohol abuse Sister    Stroke Sister        died from stroke   Hepatitis Brother    Hypertension Sister        dialysis   Diabetes Sister    Alcoholism Brother    Alcoholism Brother        dies from head injury from a fall.  not an alcoholic,   Cancer Brother    Diabetes Brother    Hypertension Brother    Diabetes Brother    Cancer Brother    Hypertension Brother    Stroke Brother    Alcoholism Brother      Current Outpatient Medications:    acetaminophen (TYLENOL) 325 MG tablet, Take 650 mg by mouth every 6 (six) hours as needed., Disp: , Rfl:    allopurinol (ZYLOPRIM) 300 MG tablet, Take 1 tablet (300 mg total) by mouth daily., Disp: 90 tablet, Rfl: 1   AMBULATORY NON FORMULARY MEDICATION, Medication Name: geni kot natural veggie laxitive, Disp: , Rfl:    aspirin EC 325 MG tablet, Take 325 mg by mouth., Disp: , Rfl:    atorvastatin (LIPITOR) 20 MG tablet, Take 1 tablet (20 mg total) by mouth daily., Disp: 90 tablet, Rfl: 1   betamethasone dipropionate 0.05 % cream, Apply topically 2 (two) times daily., Disp: 45 g, Rfl: 1   bisoprolol-hydrochlorothiazide (ZIAC) 2.5-6.25 MG tablet, Take 1 tablet by mouth 2 (two) times  daily., Disp: 180 tablet, Rfl: 1   Blood Glucose Calibration (TRUE METRIX LEVEL 1) Low SOLN, Use as directed, Disp: 1 each, Rfl: 0   cinacalcet (SENSIPAR) 30 MG tablet, , Disp: , Rfl:    citalopram (CELEXA) 20 MG tablet, TAKE 1 TABLET BY MOUTH ONCE DAILY FOR DEPRESSION, Disp: 90 tablet, Rfl: 0   cyanocobalamin 1000 MCG tablet, Take 1,000 mcg by mouth daily. Take Monday, Wednesday and Friday, Disp: , Rfl:    fluticasone-salmeterol (ADVAIR) 100-50 MCG/ACT AEPB, Inhale 1 puff into the lungs 2 (two) times daily., Disp: 1 each, Rfl: 3   furosemide (  LASIX) 40 MG tablet, TAKE 1 TABLET BY MOUTH ONCE DAILY AND  TAKE  XTRA  TAB  IF  NEEDED, Disp: 180 tablet, Rfl: 1   gabapentin (NEURONTIN) 100 MG capsule, Take 2 -4 cap at night for numbness in hands, Disp: 120 capsule, Rfl: 3   Garlic 5102 MG CAPS, Take by mouth., Disp: , Rfl:    glimepiride (AMARYL) 1 MG tablet, Take 1 tablet (1 mg total) by mouth daily., Disp: 90 tablet, Rfl: 1   glucose blood (TRUE METRIX BLOOD GLUCOSE TEST) test strip, 1 each by Other route in the morning and at bedtime. Dx E11.65, Disp: 100 each, Rfl: 3   hydrALAZINE (APRESOLINE) 25 MG tablet, TAKE 1 TABLET BY MOUTH THREE TIMES DAILY FOR BLOOD PRESSURE, Disp: 270 tablet, Rfl: 0   HYDROcodone-acetaminophen (NORCO/VICODIN) 5-325 MG tablet, Take 1 tablet by mouth every 6 (six) hours as needed for moderate pain or severe pain., Disp: 12 tablet, Rfl: 0   Insulin Pen Needle (PEN NEEDLES) 32G X 4 MM MISC, 1 Units by Does not apply route daily. Use with victoza injections daily., Disp: 100 each, Rfl: 5   ketoconazole (NIZORAL) 2 % shampoo, SHAMPOO AS DIRECTED, Disp: 120 mL, Rfl: 0   latanoprost (XALATAN) 0.005 % ophthalmic solution, latanoprost 0.005 % eye drops  INSTILL 1 DROP INTO EACH EYE IN THE EVENING, Disp: , Rfl:    linaclotide (LINZESS) 145 MCG CAPS capsule, Take 1 capsule (145 mcg total) by mouth daily before breakfast., Disp: 90 capsule, Rfl: 3   liraglutide (VICTOZA) 18 MG/3ML SOPN,  INJECT 1.8 MG ONCE DAILY SUBCUTANEOUSLY, Disp: 270 mL, Rfl: 3   losartan (COZAAR) 25 MG tablet, Take 25 mg by mouth daily. Patient takes 1/2 tab daily, Disp: , Rfl:    montelukast (SINGULAIR) 10 MG tablet, Take 1 tablet by mouth once daily, Disp: 30 tablet, Rfl: 3   Omega-3 Fatty Acids (FISH OIL PO), Take by mouth., Disp: , Rfl:    senna (SENOKOT) 8.6 MG TABS tablet, Take 1 tablet by mouth., Disp: , Rfl:    Sulfacetamide Sodium (SODIUM SULFACETAMIDE) 10 % SHAM, Apply topically., Disp: , Rfl:    TRUEplus Lancets 33G MISC, TEST BLOOD SUGAR IN THE MORNING AND AT BEDTIME, Disp: 200 each, Rfl: 3   diphenhydrAMINE (BENADRYL) 25 MG tablet, Take 25 mg by mouth daily. (Patient not taking: Reported on 07/30/2020), Disp: , Rfl:    meloxicam (MOBIC) 7.5 MG tablet, TAKE 1 TABLET BY MOUTH ONCE DAILY FOR  GOUT  AND  ARTHRITIS (Patient not taking: Reported on 07/30/2020), Disp: 90 tablet, Rfl: 0   polyethylene glycol (MIRALAX / GLYCOLAX) packet, Take 17 g by mouth daily as needed. (Patient not taking: Reported on 07/30/2020), Disp: , Rfl:    tizanidine (ZANAFLEX) 2 MG capsule, Take one tab po qhs for spasm (Patient not taking: Reported on 07/30/2020), Disp: 90 capsule, Rfl: 1   Vitamin D, Ergocalciferol, (DRISDOL) 50000 units CAPS capsule, , Disp: , Rfl:   Physical exam:  Vitals:   07/30/20 0935 07/30/20 1000  BP: (!) 146/52 (!) 147/63  Pulse: 72   Resp: 18   Temp: (!) 97.5 F (36.4 C)   SpO2: 100%   Weight: 226 lb 3.1 oz (102.6 kg)    Physical Exam Constitutional:      General: She is not in acute distress. Cardiovascular:     Rate and Rhythm: Normal rate and regular rhythm.     Heart sounds: Normal heart sounds.  Pulmonary:     Effort:  Pulmonary effort is normal.     Breath sounds: Normal breath sounds.  Abdominal:     General: Bowel sounds are normal.     Palpations: Abdomen is soft.  Skin:    General: Skin is warm and dry.  Neurological:     Mental Status: She is alert and oriented to person,  place, and time.     CMP Latest Ref Rng & Units 03/26/2020  Glucose 65 - 99 mg/dL 109(H)  BUN 8 - 27 mg/dL 32(H)  Creatinine 0.57 - 1.00 mg/dL 2.25(H)  Sodium 134 - 144 mmol/L 143  Potassium 3.5 - 5.2 mmol/L 3.9  Chloride 96 - 106 mmol/L 105  CO2 20 - 29 mmol/L 20  Calcium 8.7 - 10.3 mg/dL 8.7  Total Protein 6.0 - 8.5 g/dL 6.3  Total Bilirubin 0.0 - 1.2 mg/dL 0.4  Alkaline Phos 44 - 121 IU/L 72  AST 0 - 40 IU/L 13  ALT 0 - 32 IU/L 8   CBC Latest Ref Rng & Units 07/30/2020  WBC 4.0 - 10.5 K/uL 6.2  Hemoglobin 12.0 - 15.0 g/dL 10.6(L)  Hematocrit 36.0 - 46.0 % 32.7(L)  Platelets 150 - 400 K/uL 151    Assessment and plan- Patient is a 75 y.o. female with anemia of chronic kidney disease here for routine follow-up  Anemia of chronic kidney disease: Hemoglobin is presently stable around 10 for the last 2 years.Iron studies from today are not back but 2 months ago her ferritin levels were 60 with a normal B12 and folate level.  She does not require any IV iron at this time.  Given the stability of counts we will plan to repeat CBC ferritin and iron studies B12 in 4 months and 8 months and I will see her back in 8 months   Visit Diagnosis 1. Anemia in stage 3b chronic kidney disease ([place])   2. Iron deficiency anemia, unspecified iron deficiency anemia type      Dr. Randa Evens, MD, MPH [facility] [phone] 07/30/2020 1:03 PM

## 2020-07-31 DIAGNOSIS — D638 Anemia in other chronic diseases classified elsewhere: Secondary | ICD-10-CM | POA: Diagnosis not present

## 2020-07-31 DIAGNOSIS — E1165 Type 2 diabetes mellitus with hyperglycemia: Secondary | ICD-10-CM | POA: Diagnosis not present

## 2020-08-05 ENCOUNTER — Telehealth: Payer: Self-pay

## 2020-08-05 MED ORDER — ATORVASTATIN CALCIUM 10 MG PO TABS: 10.0000 mg | ORAL_TABLET | Freq: Every day | ORAL | 1 refills | Status: DC

## 2020-08-05 NOTE — Telephone Encounter (Signed)
Yes, 10 mg is good, she cannot tolerate higher dsoe

## 2020-08-05 NOTE — Telephone Encounter (Signed)
Frances Greene, [place], [phone], spoke to the pt in regards to meds, pt wants refill of atorvastatin 10 mg a day, [facility] in [address]. She is currently taking expired meds because she has been taking half a tab of the 20 mg and has a surplus. Spoke to pt, she confirmed she has been taking half a tab and the Dr. Clayborn Bigness was the one who told her to take it like that. She stated that at the time the med was very expensive and that was why it was sent in that way. Pt would like meds to be sent in as a 3 month supply.

## 2020-08-07 DIAGNOSIS — M80022G Age-related osteoporosis with current pathological fracture, left humerus, subsequent encounter for fracture with delayed healing: Secondary | ICD-10-CM | POA: Diagnosis not present

## 2020-08-07 DIAGNOSIS — E559 Vitamin D deficiency, unspecified: Secondary | ICD-10-CM | POA: Diagnosis not present

## 2020-08-07 DIAGNOSIS — I10 Essential (primary) hypertension: Secondary | ICD-10-CM | POA: Diagnosis not present

## 2020-08-07 DIAGNOSIS — D351 Benign neoplasm of parathyroid gland: Secondary | ICD-10-CM | POA: Diagnosis not present

## 2020-08-07 DIAGNOSIS — E1159 Type 2 diabetes mellitus with other circulatory complications: Secondary | ICD-10-CM | POA: Diagnosis not present

## 2020-08-07 DIAGNOSIS — E1165 Type 2 diabetes mellitus with hyperglycemia: Secondary | ICD-10-CM | POA: Diagnosis not present

## 2020-08-07 DIAGNOSIS — E6609 Other obesity due to excess calories: Secondary | ICD-10-CM | POA: Diagnosis not present

## 2020-08-07 DIAGNOSIS — N184 Chronic kidney disease, stage 4 (severe): Secondary | ICD-10-CM | POA: Diagnosis not present

## 2020-08-07 DIAGNOSIS — Z6841 Body Mass Index (BMI) 40.0 and over, adult: Secondary | ICD-10-CM | POA: Diagnosis not present

## 2020-08-07 DIAGNOSIS — E785 Hyperlipidemia, unspecified: Secondary | ICD-10-CM | POA: Diagnosis not present

## 2020-08-19 ENCOUNTER — Telehealth: Payer: Self-pay

## 2020-08-19 NOTE — Telephone Encounter (Signed)
Patient has been scheduled to have HST done on 08/19/2020. Patient proceeded with HST due to cost.

## 2020-08-20 ENCOUNTER — Encounter (INDEPENDENT_AMBULATORY_CARE_PROVIDER_SITE_OTHER): Payer: HMO | Admitting: Internal Medicine

## 2020-08-20 ENCOUNTER — Encounter: Payer: Self-pay | Admitting: Internal Medicine

## 2020-08-20 DIAGNOSIS — G4733 Obstructive sleep apnea (adult) (pediatric): Secondary | ICD-10-CM

## 2020-08-26 NOTE — Procedures (Signed)
[place]  Portable Polysomnogram Report Part 1 Phone: [phone] Fax: [phone]  Patient Name: Frances Greene, Frances Greene Recording Device: Glee Arvin  D.O.B.: 1945/10/28 Acquisition Number: [id number]  Referring Physician: Devona Konig, MD Acquisition Date: 08/20/2020   History: The patient is a 75 years old female who was referred for evaluation of possible sleep apnea.  Medical History: arthritis, bronchitis, CHF, CKD, diabetes, gout, hypertension, OSA.  Medications: allopurinol, Victoza, Lipitor, hydrocodone, hydralazine, Celexa, Benadryl, Lasix, gabapentin, glimepiride, Linzess, losartan, meloxicam. Singulair, tizanidine.  PROCEDURE  The unattended portable polysomnogram was conducted on the night of 08/20/2020.  The following parameters were monitored: Nasal and oral airflow, and body position. Additionally, thoracic and abdominal movements were recorded by inductance plethysmography. Oxygen saturation (SpO2) and heart rate (ECG) was monitored using a pulse Oximeter.  The tracing was scored using 30 second epochs. Hypopneas were scored per AASM definition VIII4.B (3% desaturation).    Description: The total recording time was 653.8 minutes. Sleep parameters are not recorded.  Respiratory monitoring demonstrated significant snoring across the night in all positions. There were a total of 504 apneas and hypopneas for a Respiratory Event Index of 53.9 apneas and hypopneas per hour of recording and increased to 72.1 in the supine position. The average duration of the respiratory events was 20.3 seconds with a maximum duration of 455.5 seconds. The respiratory events were associated with peripheral oxygen desaturations on the average to 86 %. The lowest oxygen desaturation associated with a respiratory event was 74 %. Additionally, the mean oxygen saturation was 86 %. The total duration of oxygen < 90% was 399.5 minutes and <80% was 4.5 minutes.   Cardiac  monitoring- The average heart rate during the recording was 76.0 bpm.  Impression: This routine overnight portable polysomnogram demonstrated the presence of obstructive sleep apnea. Overall the Respiratory Event Index was 53.9 apneas and hypopneas per hour of recording and increased to 72.1 in the supine position.   Recommendations:     A CPAP titration in the supine position would be recommended due to the severity of the sleep apnea. Would recommend weight loss in a patient with a BMI of 40.0 lb/in2.  Allyne Gee, MD [facility] Diplomate ABMS Pulmonary Critical Care Medicine Sleep Medicine Electronically reviewed and digitally signed    [place]  Portable Polysomnogram Report Part 2 Phone: [phone] Fax: [phone]    Study Date: 08/20/2020  Patient Name: Frances Greene, Frances Greene Recording Device: Glee Arvin  Sex: F Height: 63.0 in.  D.O.B.: 07/08/1945 Weight: 226.0 lbs.  Age: 18 years B.M.I: 40.0 lb/in2   Times and Durations  Lights off clock time:  12:30:02 AM Total Recording Time (TRT): 653.8 minutes  Lights on clock time: 11:23:50 AM Time In Bed (TIB): 653.8 minutes   Summary  AHI 53.9 OAI 14.0 CAI 0.3 Lowest Desat 74  AHI is the number of apneas and hypopneas per hour. OAI is the number of obstructive apneas per hour. CAI is the number of central apneas per hour. Lowest Desat is the lowest blood oxygen level that lasted at least 2 seconds.  RESPIRATORY EVENTS   Index (#/hour) Total # of Events Mean duration  (sec) Max duration  (sec) # of Events by Position       Supine Prone Left Right Up  Central Apneas 0.3 3 16.7 24.5 3    0 0 0  Obstructive Apneas 14.0 131 15.5 87.5 117    12 2 0  Mixed Apneas 0.1 1  455.5 455.5 1    0 0 0  Hypopneas 39.5 369 20.8 66.0 265    62 42 0  Apneas + Hypopneas 53.9 504 20.3 455.5 386    74 44 0  Total 53.9 504 20.3 455.5 386    74 44 0  Time in Position 321.7    105.3 133.2 92.3  AHI in Position 72.1    42.2 19.9 0.0     Oximetry Summary   Dur. (min) % TIB  <90 % 399.5 61.1  <85 % 125.7 19.2  <80 % 4.5 0.7  <70 % 0.0 0.0  Total Dur (min) < 89 363.7 min  Average (%) 86  Total # of Desats 527  Desat Index (#/hour) 76.1  Desat Max (%) 12  Desat Max dur (sec) 44.0  Lowest SpO2 % during sleep 74  Duration of Min SpO2 (sec) 5    Heart Rate Stats  Mean HR during sleep (BPM)  Highest HR during sleep 84  (BPM)  Highest HR during TIB  84 (BPM)    Snoring Summary  Total Snoring Episodes 528  Total Duration with Snoring 85.6 minutes  Mean Duration of Snoring 9.7 seconds  Percentage of Snoring 15.3 %

## 2020-08-26 NOTE — Procedures (Signed)
[place]  Portable Polysomnogram Report Part 1 Phone: [phone] Fax: [phone]  Patient Name: Frances Greene, Frances Greene Recording Device: Glee Arvin  D.O.B.: 21-Dec-1945 Acquisition Number: [id number]  Referring Physician: Devona Konig, MD Acquisition Date: 08/20/2020   History: The patient is a 75 years old female who was referred for evaluation of possible sleep apnea.  Medical History: arthritis, bronchitis, CHF, CKD, diabetes, gout, hypertension, OSA.  Medications: allopurinol, Victoza, Lipitor, hydrocodone, hydralazine, Celexa, Benadryl, Lasix, gabapentin, glimepiride, Linzess, losartan, meloxicam. Singulair, tizanidine.  PROCEDURE  The unattended portable polysomnogram was conducted on the night of 08/20/2020.  The following parameters were monitored: Nasal and oral airflow, and body position. Additionally, thoracic and abdominal movements were recorded by inductance plethysmography. Oxygen saturation (SpO2) and heart rate (ECG) was monitored using a pulse Oximeter.  The tracing was scored using 30 second epochs. Hypopneas were scored per AASM definition VIII4.B (3% desaturation).    Description: The total recording time was 653.8 minutes. Sleep parameters are not recorded.  Respiratory monitoring demonstrated significant snoring across the night in all positions. There were a total of 504 apneas and hypopneas for a Respiratory Event Index of 53.9 apneas and hypopneas per hour of recording and increased to 72.1 in the supine position. The average duration of the respiratory events was 20.3 seconds with a maximum duration of 455.5 seconds. The respiratory events were associated with peripheral oxygen desaturations on the average to 86 %. The lowest oxygen desaturation associated with a respiratory event was 74 %. Additionally, the mean oxygen saturation was 86 %. The total duration of oxygen < 90% was 399.5 minutes and <80% was 4.5 minutes.   Cardiac  monitoring- The average heart rate during the recording was 76.0 bpm.  Impression: This routine overnight portable polysomnogram demonstrated the presence of obstructive sleep apnea. Overall the Respiratory Event Index was 53.9 apneas and hypopneas per hour of recording and increased to 72.1 in the supine position.   Recommendations:     A CPAP titration in the supine position would be recommended due to the severity of the sleep apnea. Would recommend weight loss in a patient with a BMI of 40.0 lb/in2.  Allyne Gee, MD [facility] Diplomate ABMS Pulmonary Critical Care Medicine Sleep Medicine Electronically reviewed and digitally signed    [place]  Portable Polysomnogram Report Part 2 Phone: [phone] Fax: [phone]    Study Date: 08/20/2020  Patient Name: Frances Greene, Frances Greene Recording Device: Glee Arvin  Sex: F Height: 63.0 in.  D.O.B.: 07/02/1945 Weight: 226.0 lbs.  Age: 79 years B.M.I: 40.0 lb/in2   Times and Durations  Lights off clock time:  12:30:02 AM Total Recording Time (TRT): 653.8 minutes  Lights on clock time: 11:23:50 AM Time In Bed (TIB): 653.8 minutes   Summary  AHI 53.9 OAI 14.0 CAI 0.3 Lowest Desat 74  AHI is the number of apneas and hypopneas per hour. OAI is the number of obstructive apneas per hour. CAI is the number of central apneas per hour. Lowest Desat is the lowest blood oxygen level that lasted at least 2 seconds.  RESPIRATORY EVENTS   Index (#/hour) Total # of Events Mean duration  (sec) Max duration  (sec) # of Events by Position       Supine Prone Left Right Up  Central Apneas 0.3 3 16.7 24.5 3    0 0 0  Obstructive Apneas 14.0 131 15.5 87.5 117    12 2 0  Mixed Apneas 0.1 1  455.5 455.5 1    0 0 0  Hypopneas 39.5 369 20.8 66.0 265    62 42 0  Apneas + Hypopneas 53.9 504 20.3 455.5 386    74 44 0  Total 53.9 504 20.3 455.5 386    74 44 0  Time in Position 321.7    105.3 133.2 92.3  AHI in Position 72.1    42.2 19.9 0.0     Oximetry Summary   Dur. (min) % TIB  <90 % 399.5 61.1  <85 % 125.7 19.2  <80 % 4.5 0.7  <70 % 0.0 0.0  Total Dur (min) < 89 363.7 min  Average (%) 86  Total # of Desats 527  Desat Index (#/hour) 76.1  Desat Max (%) 12  Desat Max dur (sec) 44.0  Lowest SpO2 % during sleep 74  Duration of Min SpO2 (sec) 5    Heart Rate Stats  Mean HR during sleep (BPM)  Highest HR during sleep 84  (BPM)  Highest HR during TIB  84 (BPM)    Snoring Summary  Total Snoring Episodes 528  Total Duration with Snoring 85.6 minutes  Mean Duration of Snoring 9.7 seconds  Percentage of Snoring 15.3 %

## 2020-09-04 ENCOUNTER — Ambulatory Visit (INDEPENDENT_AMBULATORY_CARE_PROVIDER_SITE_OTHER): Payer: HMO | Admitting: Nurse Practitioner

## 2020-09-04 ENCOUNTER — Other Ambulatory Visit: Payer: Self-pay

## 2020-09-04 ENCOUNTER — Encounter: Payer: Self-pay | Admitting: Nurse Practitioner

## 2020-09-04 VITALS — BP 135/70 | HR 70 | Temp 97.2°F | Resp 16 | Ht 62.0 in | Wt 226.4 lb

## 2020-09-04 DIAGNOSIS — Z6841 Body Mass Index (BMI) 40.0 and over, adult: Secondary | ICD-10-CM

## 2020-09-04 DIAGNOSIS — Z0001 Encounter for general adult medical examination with abnormal findings: Secondary | ICD-10-CM | POA: Diagnosis not present

## 2020-09-04 DIAGNOSIS — D638 Anemia in other chronic diseases classified elsewhere: Secondary | ICD-10-CM

## 2020-09-04 DIAGNOSIS — N1832 Chronic kidney disease, stage 3b: Secondary | ICD-10-CM | POA: Diagnosis not present

## 2020-09-04 DIAGNOSIS — Z23 Encounter for immunization: Secondary | ICD-10-CM | POA: Diagnosis not present

## 2020-09-04 DIAGNOSIS — E782 Mixed hyperlipidemia: Secondary | ICD-10-CM

## 2020-09-04 DIAGNOSIS — R300 Dysuria: Secondary | ICD-10-CM | POA: Diagnosis not present

## 2020-09-04 DIAGNOSIS — E1122 Type 2 diabetes mellitus with diabetic chronic kidney disease: Secondary | ICD-10-CM | POA: Diagnosis not present

## 2020-09-04 DIAGNOSIS — I10 Essential (primary) hypertension: Secondary | ICD-10-CM

## 2020-09-04 LAB — POCT GLYCOSYLATED HEMOGLOBIN (HGB A1C): Hemoglobin A1C: 5.8 % — AB (ref 4.0–5.6)

## 2020-09-04 MED ORDER — ZOSTER VAC RECOMB ADJUVANTED 50 MCG/0.5ML IM SUSR: 0.5000 mL | Freq: Once | INTRAMUSCULAR | 0 refills | Status: AC

## 2020-09-04 MED ORDER — TETANUS-DIPHTH-ACELL PERTUSSIS 5-2.5-18.5 LF-MCG/0.5 IM SUSP: 0.5000 mL | Freq: Once | INTRAMUSCULAR | 0 refills | Status: AC

## 2020-09-04 NOTE — Progress Notes (Signed)
[facility] [place], [place] [address]  Internal MEDICINE  Office Visit Note  Patient Name: Frances Greene  [id number]  [id number]  Date of Service: 09/04/2020  Chief Complaint  Patient presents with   Medicare Wellness    Discuss psg, atorvastatin gap refill   Diabetes   Hypertension    HPI Frances Greene presents for an annual well visit and physical exam. she has a history of arthritis, CHF, sleep apnea, diabetes, hypertension, CKD, gout, hysterectomy, and a right cataract s/p surgical removal. She has had 2 COVID vaccine doses and 1 booster dose. She lives at home alone and is independent. She is retired from working for the [employer]. She quit smoking cigarattes 20 years ago. She denies alcohol use and denies recreational drug use. She denies any pain. She sees a specialist for anemia, Dr. Janese Banks. She sees Dr. Devona Konig for sleep apnea.  -discussed sleep study result with patient.significant sleep apnea was identified especially in the supine position. Overall AHI of 53.9/hr but this number increased to 72.1 in supine position. Lowest oxygen desaturation was 74%. She spent almost 400 minutes of the sleep study under 90% oxygen saturation and approximately 4.5 minutes with her oxygen saturation less than 80%. Recommendation is for a CPAP titration study and weight loss due to BMI or 40. ---patient has already received a call from a person named Clarene Critchley that the CPAP machine has been approved by her insurance and the order has been placed for the machine per Theodoro Kos.  -she has a mammogram already ordered and will be getting it done soon per patient, also has bone density scan scheduled for next Thursday per patient report.  ---A1C is 5.8 today which is improved from 6.0 in march 2022. She takes glimepiride and Victoza. She checks her glucose levels at home once daily. She reports that her glucose levels have been under 140. She needs a new pair of  diabetic shoes.     Current Medication: Outpatient Encounter Medications as of 09/04/2020  Medication Sig Note   acetaminophen (TYLENOL) 325 MG tablet Take 650 mg by mouth every 6 (six) hours as needed. 03/12/2020: Prn    allopurinol (ZYLOPRIM) 300 MG tablet Take 1 tablet (300 mg total) by mouth daily. (Patient taking differently: Take 300 mg by mouth daily. Pt takes 1/2 tablet)    AMBULATORY NON FORMULARY MEDICATION Medication Name: geni kot natural veggie laxitive    aspirin EC 325 MG tablet Take 325 mg by mouth. 01/23/2016: Received from: [facility]: Take 325 mg by mouth.   atorvastatin (LIPITOR) 10 MG tablet Take 1 tablet (10 mg total) by mouth daily.    betamethasone dipropionate 0.05 % cream Apply topically 2 (two) times daily.    bisoprolol-hydrochlorothiazide (ZIAC) 2.5-6.25 MG tablet Take 1 tablet by mouth 2 (two) times daily.    Blood Glucose Calibration (TRUE METRIX LEVEL 1) Low SOLN Use as directed    cinacalcet (SENSIPAR) 30 MG tablet     citalopram (CELEXA) 20 MG tablet TAKE 1 TABLET BY MOUTH ONCE DAILY FOR DEPRESSION (Patient taking differently: TAKE 1 TABLET BY MOUTH ONCE DAILY FOR DEPRESSION (pt takes 1/2 tablet))    cyanocobalamin 1000 MCG tablet Take 1,000 mcg by mouth daily. Take Monday, Wednesday and Friday    diphenhydrAMINE (BENADRYL) 25 MG tablet Take 25 mg by mouth daily.    fluticasone-salmeterol (ADVAIR) 100-50 MCG/ACT AEPB Inhale 1 puff into the lungs 2 (two) times daily.  furosemide (LASIX) 40 MG tablet TAKE 1 TABLET BY MOUTH ONCE DAILY AND  TAKE  XTRA  TAB  IF  NEEDED    Garlic 8588 MG CAPS Take by mouth. 01/23/2016: Received from: [place]: Take by mouth.   glimepiride (AMARYL) 1 MG tablet Take 1 tablet (1 mg total) by mouth daily.    glucose blood (TRUE METRIX BLOOD GLUCOSE TEST) test strip 1 each by Other route in the morning and at bedtime. Dx E11.65    hydrALAZINE (APRESOLINE) 25 MG tablet TAKE 1 TABLET BY  MOUTH THREE TIMES DAILY FOR BLOOD PRESSURE    HYDROcodone-acetaminophen (NORCO/VICODIN) 5-325 MG tablet Take 1 tablet by mouth every 6 (six) hours as needed for moderate pain or severe pain.    Insulin Pen Needle (PEN NEEDLES) 32G X 4 MM MISC 1 Units by Does not apply route daily. Use with victoza injections daily.    ketoconazole (NIZORAL) 2 % shampoo SHAMPOO AS DIRECTED    latanoprost (XALATAN) 0.005 % ophthalmic solution latanoprost 0.005 % eye drops  INSTILL 1 DROP INTO EACH EYE IN THE EVENING    linaclotide (LINZESS) 145 MCG CAPS capsule Take 1 capsule (145 mcg total) by mouth daily before breakfast.    liraglutide (VICTOZA) 18 MG/3ML SOPN INJECT 1.8 MG ONCE DAILY SUBCUTANEOUSLY    losartan (COZAAR) 25 MG tablet Take 25 mg by mouth daily. Patient takes 1/2 tab daily    meloxicam (MOBIC) 7.5 MG tablet TAKE 1 TABLET BY MOUTH ONCE DAILY FOR  GOUT  AND  ARTHRITIS    Omega-3 Fatty Acids (FISH OIL PO) Take by mouth. 01/23/2016: Received from: [facility]: Take 1 capsule by mouth 2 (two) times daily.   polyethylene glycol (MIRALAX / GLYCOLAX) packet Take 17 g by mouth daily as needed.    senna (SENOKOT) 8.6 MG TABS tablet Take 1 tablet by mouth.    Sulfacetamide Sodium (SODIUM SULFACETAMIDE) 10 % SHAM Apply topically.    tizanidine (ZANAFLEX) 2 MG capsule Take one tab po qhs for spasm    TRUEplus Lancets 33G MISC TEST BLOOD SUGAR IN THE MORNING AND AT BEDTIME    Vitamin D, Ergocalciferol, (DRISDOL) 50000 units CAPS capsule  01/23/2016: Received from: [facility]   [DISCONTINUED] montelukast (SINGULAIR) 10 MG tablet Take 1 tablet by mouth once daily    [DISCONTINUED] Tdap (BOOSTRIX) 5-2.5-18.5 LF-MCG/0.5 injection Inject 0.5 mLs into the muscle once.    [DISCONTINUED] Zoster Vaccine Adjuvanted [facility]) injection Inject 0.5 mLs into the muscle once.    [EXPIRED] Tdap (BOOSTRIX) 5-2.5-18.5 LF-MCG/0.5 injection Inject 0.5 mLs into the muscle once for 1  dose.    [EXPIRED] Zoster Vaccine Adjuvanted [facility]) injection Inject 0.5 mLs into the muscle once for 1 dose.    [DISCONTINUED] gabapentin (NEURONTIN) 100 MG capsule Take 2 -4 cap at night for numbness in hands (Patient not taking: Reported on 09/04/2020)    No facility-administered encounter medications on file as of 09/04/2020.    Surgical History: Past Surgical History:  Procedure Laterality Date   ABDOMINAL HYSTERECTOMY     BREAST BIOPSY Left 1989   neg   CATARACT EXTRACTION W/PHACO Right 05/21/2015   Procedure: CATARACT EXTRACTION PHACO AND INTRAOCULAR LENS PLACEMENT (IOC);  Surgeon: Milus Height, MD;  Location: [facility] ORS;  Service: Ophthalmology;  Laterality: Right;  [place]: AP%: 10.0 CDE: 9.53   COLONOSCOPY     JOINT REPLACEMENT Bilateral    knee   PARS PLANA VITRECTOMY Right 05/21/2015  Procedure: PARS PLANA VITRECTOMY WITH 25 GAUGE;  Surgeon: Milus Height, MD;  Location: [facility] ORS;  Service: Ophthalmology;  Laterality: Right;  Lot # [id number] Endo laser: Watts: 300 Pulse Duration: 200 Total Pulses:143    shoulder surgery      Medical History: Past Medical History:  Diagnosis Date   Arthritis    Bronchitis    CHF (congestive heart failure) (HCC)    Chronic kidney disease    decreased kidney function   Diabetes mellitus without complication (HCC)    Gout    Hypertension    Multiple allergies    Shortness of breath dyspnea    w/ exertion   Sleep apnea    CPAP   Swelling of both lower extremities    Wheezing     Family History: Family History  Problem Relation Age of Onset   Breast cancer Cousin    Breast cancer Other    Hypertension Mother    Diabetes Mother    Hypertension Father    Diabetes Father    Alcohol abuse Father    Aneurysm Sister    Alcohol abuse Sister    Stroke Sister        died from stroke   Hepatitis Brother    Hypertension Sister        dialysis   Diabetes Sister    Alcoholism Brother    Alcoholism Brother        dies from  head injury from a fall.  not an alcoholic,   Cancer Brother    Diabetes Brother    Hypertension Brother    Diabetes Brother    Cancer Brother    Hypertension Brother    Stroke Brother    Alcoholism Brother     Social History   Socioeconomic History   Marital status: Divorced    Spouse name: Not on file   Number of children: Not on file   Years of education: Not on file   Highest education level: Not on file  Occupational History   Not on file  Tobacco Use   Smoking status: Former    Types: Cigarettes   Smokeless tobacco: Never   Tobacco comments:    4 cigarettes quit 20 years ago  Scientific laboratory technician Use: Never used  Substance and Sexual Activity   Alcohol use: No   Drug use: Never   Sexual activity: Not on file  Other Topics Concern   Not on file  Social History Narrative   Not on file   Social Determinants of Health   Financial Resource Strain: Not on file  Food Insecurity: Not on file  Transportation Needs: Not on file  Physical Activity: Not on file  Stress: Not on file  Social Connections: Not on file  Intimate Partner Violence: Not on file      Review of Systems  Constitutional:  Negative for activity change, appetite change, chills, fatigue, fever and unexpected weight change.  HENT: Negative.  Negative for congestion, ear pain, rhinorrhea, sore throat and trouble swallowing.   Eyes: Negative.   Respiratory: Negative.  Negative for cough, chest tightness, shortness of breath and wheezing.   Cardiovascular: Negative.  Negative for chest pain.  Gastrointestinal: Negative.  Negative for abdominal pain, blood in stool, constipation, diarrhea, nausea and vomiting.  Endocrine: Negative.   Genitourinary: Negative.  Negative for difficulty urinating, dysuria, frequency, hematuria and urgency.  Musculoskeletal: Negative.  Negative for arthralgias, back pain, joint swelling, myalgias and neck pain.  Skin: Negative.  Negative for rash and wound.   Allergic/Immunologic: Negative.  Negative for immunocompromised state.  Neurological: Negative.  Negative for dizziness, seizures, numbness and headaches.  Hematological: Negative.   Psychiatric/Behavioral: Negative.  Negative for behavioral problems, self-injury and suicidal ideas. The patient is not nervous/anxious.    Vital Signs: BP (!) 144/72   Pulse 70   Temp (!) 97.2 F (36.2 C)   Resp 16   Ht 5\' 2"  (1.575 m)   Wt 226 lb 6.4 oz (102.7 kg)   SpO2 97%   BMI 41.41 kg/m    Physical Exam Vitals reviewed.  Constitutional:      General: She is awake. She is not in acute distress.    Appearance: Normal appearance. She is well-developed and well-groomed. She is obese. She is not ill-appearing or diaphoretic.  HENT:     Head: Normocephalic and atraumatic.     Right Ear: Tympanic membrane, ear canal and external ear normal.     Left Ear: Tympanic membrane, ear canal and external ear normal.     Nose: Nose normal. No congestion or rhinorrhea.     Mouth/Throat:     Lips: Pink.     Mouth: Mucous membranes are moist.     Pharynx: Oropharynx is clear. Uvula midline. No oropharyngeal exudate or posterior oropharyngeal erythema.  Eyes:     General: Lids are normal. Vision grossly intact. Gaze aligned appropriately. No scleral icterus.       Right eye: No discharge.        Left eye: No discharge.     Conjunctiva/sclera: Conjunctivae normal.     Pupils: Pupils are equal, round, and reactive to light.     Funduscopic exam:    Right eye: Red reflex present.        Left eye: Red reflex present. Neck:     Thyroid: No thyromegaly.     Vascular: No JVD.     Trachea: No tracheal deviation.  Cardiovascular:     Rate and Rhythm: Normal rate and regular rhythm.     Pulses:          Carotid pulses are 3+ on the right side and 3+ on the left side.      Radial pulses are 2+ on the right side and 2+ on the left side.       Dorsalis pedis pulses are 2+ on the right side and 2+ on the left  side.       Posterior tibial pulses are 2+ on the right side and 2+ on the left side.     Heart sounds: Normal heart sounds, S1 normal and S2 normal. No murmur heard.   No friction rub. No gallop.  Pulmonary:     Effort: Pulmonary effort is normal. No accessory muscle usage or respiratory distress.     Breath sounds: Normal breath sounds and air entry. No stridor. No wheezing or rales.  Chest:     Chest wall: No tenderness.     Comments: Gets annual mammograms, declined clinical breast exam today. Abdominal:     General: Bowel sounds are normal. There is no distension.     Palpations: Abdomen is soft. There is no shifting dullness, fluid wave, mass or pulsatile mass.     Tenderness: There is no abdominal tenderness. There is no guarding or rebound.  Musculoskeletal:        General: No tenderness or deformity. Normal range of motion.     Cervical back: Normal range of motion and neck  supple.     Right lower leg: No edema.     Left lower leg: No edema.  Lymphadenopathy:     Cervical: No cervical adenopathy.  Skin:    General: Skin is warm and dry.     Capillary Refill: Capillary refill takes less than 2 seconds.     Coloration: Skin is not pale.     Findings: No erythema or rash.  Neurological:     Mental Status: She is alert and oriented to person, place, and time.     Cranial Nerves: No cranial nerve deficit.     Motor: No abnormal muscle tone.     Coordination: Coordination normal.     Deep Tendon Reflexes: Reflexes are normal and symmetric.  Psychiatric:        Mood and Affect: Mood and affect normal.        Behavior: Behavior normal. Behavior is cooperative.        Thought Content: Thought content normal.        Judgment: Judgment normal.     Assessment/Plan: 1. Encounter for general adult medical examination with abnormal findings Age-appropriate preventive screenings and vaccinations discussed, annual physical exam completed. Routine labs for health maintenance not  ordered at this time. She had some labs done in July for Dr. Janese Banks and she is getting labs done again in November so labs will be ordered for her follow up visit. Bone density scan is scheduled for next Thursday per patient. Her last mammogram was in September last year. She has an order for a mammogram and states that she thinks they are doing it when she goes for her bone density scan. She is due for shingles and tetanus vaccine which have been ordered. PHM updated. Unable to do foot exam, patient is wearing shoes that she needs shoe horn to put back on. Informed patient that her foot exam will be done at her follow up visit in November and to wear appropriate shoes, she acknowledged and agreed with this plan. Screening colonoscopy was done in 2021, a few polyps were found per patient, she is due again in 2024, it is done every 3 years due to polyps.   2. Essential hypertension Stable when rechecked, see vitals, continue current medication as prescribed.   3. Type 2 diabetes mellitus with stage 3b chronic kidney disease, without long-term current use of insulin (HCC) Has been consistently staying under 6.4 in prediabetic range, current A1C is 5.8, recheck in 3 months. Will extend interval to 6 months if still stable in 3 months.  - POCT HgB A1C - Ambulatory referral to Podiatry  4. Anemia of chronic disease Follow by Dr. Randa Evens, oncology, has labs done every 1-2 months.   5. Mixed hyperlipidemia She is having labs done soon with her oncologist, will check lipid panel at a follow up visit if it is not added to those labs.   6. BMI 40.0-44.9, adult [facility]) Patient is aware that weight loss will improve her BMI. Discussed diet and lifestyle modifications. Patient will continue to work on decreasing high carb foods and increasing physical activity such as walking.  7. Dysuria Routine urinalysis done - UA/M w/rflx Culture, Routine - Microscopic Examination - Urine Culture, Reflex  8. Need for  vaccination - Zoster Vaccine Adjuvanted [facility]) injection; Inject 0.5 mLs into the muscle once for 1 dose.  Dispense: 0.5 mL; Refill: 0 - Tdap (BOOSTRIX) 5-2.5-18.5 LF-MCG/0.5 injection; Inject 0.5 mLs into the muscle once for 1 dose.  Dispense: 0.5  mL; Refill: 0      General Counseling: Davionne verbalizes understanding of the findings of todays visit and agrees with plan of treatment. I have discussed any further diagnostic evaluation that may be needed or ordered today. We also reviewed her medications today. she has been encouraged to call the office with any questions or concerns that should arise related to todays visit.    Orders Placed This Encounter  Procedures   Microscopic Examination   Urine Culture, Reflex   UA/M w/rflx Culture, Routine   POCT HgB A1C    Meds ordered this encounter  Medications   Zoster Vaccine Adjuvanted [facility]) injection    Sig: Inject 0.5 mLs into the muscle once for 1 dose.    Dispense:  0.5 mL    Refill:  0   Tdap (BOOSTRIX) 5-2.5-18.5 LF-MCG/0.5 injection    Sig: Inject 0.5 mLs into the muscle once for 1 dose.    Dispense:  0.5 mL    Refill:  0    Return in about 3 months (around 12/05/2020) for F/U, Recheck A1C and foot exam, Samanth Mirkin PCP.   Total time spent:30 Minutes Time spent includes review of chart, medications, test results, and follow up plan with the patient.   [place] Controlled Substance Database was reviewed by me.  This patient was seen by Jonetta Osgood, FNP-C in collaboration with Dr. Clayborn Bigness as a part of collaborative care agreement.  Ilithyia Titzer R. Valetta Fuller, MSN, FNP-C Internal medicine

## 2020-09-05 ENCOUNTER — Telehealth: Payer: Self-pay | Admitting: Nurse Practitioner

## 2020-09-05 NOTE — Chronic Care Management (AMB) (Signed)
  Chronic Care Management   Note  09/05/2020 Name: LASHUN RAMSEYER MRN: [id number] DOB: 24-Jul-1945  SHARDA KEDDY is a 75 y.o. year old female who is a primary care patient of Jonetta Osgood, NP. I reached out to Mindi Junker by phone today in response to a referral sent by Ms. Royston Cowper Cummings's PCP, Jonetta Osgood, NP.   Ms. Rota was given information about Chronic Care Management services today including:  CCM service includes personalized support from designated clinical staff supervised by her physician, including individualized plan of care and coordination with other care providers 24/7 contact phone numbers for assistance for urgent and routine care needs. Service will only be billed when office clinical staff spend 20 minutes or more in a month to coordinate care. Only one practitioner may furnish and bill the service in a calendar month. The patient may stop CCM services at any time (effective at the end of the month) by phone call to the office staff.   Patient agreed to services and verbal consent obtained.   Follow up plan:   Tatjana Secretary/administrator

## 2020-09-08 ENCOUNTER — Telehealth: Payer: Self-pay

## 2020-09-08 LAB — MICROSCOPIC EXAMINATION: Bacteria, UA: NONE SEEN

## 2020-09-08 LAB — UA/M W/RFLX CULTURE, ROUTINE
Bilirubin, UA: NEGATIVE
Glucose, UA: NEGATIVE
Ketones, UA: NEGATIVE
Nitrite, UA: NEGATIVE
RBC, UA: NEGATIVE
Specific Gravity, UA: 1.013 (ref 1.005–1.030)
Urobilinogen, Ur: 0.2 mg/dL (ref 0.2–1.0)
pH, UA: 5.5 (ref 5.0–7.5)

## 2020-09-08 LAB — URINE CULTURE, REFLEX

## 2020-09-08 NOTE — Telephone Encounter (Signed)
Patient called asking if you sent in prescription for diabetic shoes. She would like a call back. Sent message to [employer]

## 2020-09-09 ENCOUNTER — Other Ambulatory Visit: Payer: Self-pay | Admitting: Internal Medicine

## 2020-09-09 DIAGNOSIS — J309 Allergic rhinitis, unspecified: Secondary | ICD-10-CM

## 2020-09-11 DIAGNOSIS — E6609 Other obesity due to excess calories: Secondary | ICD-10-CM | POA: Diagnosis not present

## 2020-09-11 DIAGNOSIS — N184 Chronic kidney disease, stage 4 (severe): Secondary | ICD-10-CM | POA: Diagnosis not present

## 2020-09-11 DIAGNOSIS — E559 Vitamin D deficiency, unspecified: Secondary | ICD-10-CM | POA: Diagnosis not present

## 2020-09-11 DIAGNOSIS — E785 Hyperlipidemia, unspecified: Secondary | ICD-10-CM | POA: Diagnosis not present

## 2020-09-11 DIAGNOSIS — D351 Benign neoplasm of parathyroid gland: Secondary | ICD-10-CM | POA: Diagnosis not present

## 2020-09-11 DIAGNOSIS — M810 Age-related osteoporosis without current pathological fracture: Secondary | ICD-10-CM | POA: Diagnosis not present

## 2020-09-11 DIAGNOSIS — I10 Essential (primary) hypertension: Secondary | ICD-10-CM | POA: Diagnosis not present

## 2020-09-11 DIAGNOSIS — E1159 Type 2 diabetes mellitus with other circulatory complications: Secondary | ICD-10-CM | POA: Diagnosis not present

## 2020-09-14 ENCOUNTER — Encounter: Payer: Self-pay | Admitting: Nurse Practitioner

## 2020-09-15 ENCOUNTER — Encounter: Payer: Self-pay | Admitting: Oncology

## 2020-09-16 ENCOUNTER — Telehealth: Payer: Self-pay

## 2020-09-16 NOTE — Telephone Encounter (Signed)
Patient called again asking if rx was sent in diabetic shoes. She also asked if orders for flu shots and other immunizations were sent into [facility]

## 2020-09-17 ENCOUNTER — Other Ambulatory Visit: Payer: Self-pay | Admitting: Internal Medicine

## 2020-09-17 NOTE — Telephone Encounter (Signed)
Please see this

## 2020-09-17 NOTE — Telephone Encounter (Signed)
Left message to call us back

## 2020-09-17 NOTE — Telephone Encounter (Signed)
Pt advised that we can do her foot exam on next visit and then we can send new order to [facility]

## 2020-09-26 ENCOUNTER — Other Ambulatory Visit: Payer: Self-pay | Admitting: Nurse Practitioner

## 2020-09-26 DIAGNOSIS — M5441 Lumbago with sciatica, right side: Secondary | ICD-10-CM

## 2020-10-01 ENCOUNTER — Other Ambulatory Visit: Payer: Self-pay | Admitting: Internal Medicine

## 2020-10-01 ENCOUNTER — Other Ambulatory Visit: Payer: Self-pay | Admitting: Adult Health

## 2020-10-21 ENCOUNTER — Other Ambulatory Visit: Payer: Self-pay | Admitting: Adult Health

## 2020-10-27 ENCOUNTER — Telehealth: Payer: Self-pay | Admitting: Pharmacist

## 2020-10-27 NOTE — Progress Notes (Signed)
Chronic Care Management Pharmacy Assistant   Name: Frances Greene  MRN: [id number] DOB: 10-08-1945  Frances Greene is an 75 y.o. year old female who presents for his initial CCM visit with the clinical pharmacist.  Reason for Encounter: Chart Prep    Conditions to be addressed/monitored: HTN, Type 2 DM, Vitamin D, Iron Deficiency, HLD.   Primary concerns for visit include: HTN, Type 2 DM.   Recent office visits:  08/05/20 (Telephone) CHANGED Atorvastatin to 10 mg by mouth daily. 07/09/20 Arlyss Queen, NP. For acute home visit. STARTED Azithromycin 250 mg 1 tab a day for 10 days and Fluticasone-Salmeterol 100-50 MCG/ACT 1 puff inhalation 2 times daily.  06/17/20 Dr. Humphrey Rolls For follow-up. No medication changes.  Recent consult visits:  07/30/20 Oncology Sindy Guadeloupe, MD. For follow-up. STOPPED Azithromycin 250 mg. 06/24/20 Nephrology Anthonette Legato, MD  For follow-up. 05/21/20 Eldridge Abrahams, NP. For follow-up. No medication changes.  Hospital visits:  None in the last six months  Medications: Outpatient Encounter Medications as of 10/27/2020  Medication Sig Note   acetaminophen (TYLENOL) 325 MG tablet Take 650 mg by mouth every 6 (six) hours as needed. 03/12/2020: Prn    allopurinol (ZYLOPRIM) 300 MG tablet Take 1 tablet (300 mg total) by mouth daily. (Patient taking differently: Take 300 mg by mouth daily. Pt takes 1/2 tablet)    AMBULATORY NON FORMULARY MEDICATION Medication Name: geni kot natural veggie laxitive    aspirin EC 325 MG tablet Take 325 mg by mouth. 01/23/2016: Received from: [place]: Take 325 mg by mouth.   atorvastatin (LIPITOR) 10 MG tablet Take 1 tablet (10 mg total) by mouth daily.    betamethasone dipropionate 0.05 % cream Apply topically 2 (two) times daily.    bisoprolol-hydrochlorothiazide (ZIAC) 2.5-6.25 MG tablet Take 1 tablet by mouth twice daily    Blood Glucose Calibration (TRUE METRIX LEVEL 1) Low SOLN Use as  directed    cinacalcet (SENSIPAR) 30 MG tablet     citalopram (CELEXA) 20 MG tablet TAKE 1 TABLET BY MOUTH ONCE DAILY FOR DEPRESSION (Patient taking differently: TAKE 1 TABLET BY MOUTH ONCE DAILY FOR DEPRESSION (pt takes 1/2 tablet))    cyanocobalamin 1000 MCG tablet Take 1,000 mcg by mouth daily. Take Monday, Wednesday and Friday    diphenhydrAMINE (BENADRYL) 25 MG tablet Take 25 mg by mouth daily.    fluticasone-salmeterol (ADVAIR) 100-50 MCG/ACT AEPB Inhale 1 puff into the lungs 2 (two) times daily.    furosemide (LASIX) 40 MG tablet TAKE 1 TABLET BY MOUTH ONCE DAILY AND  TAKE  XTRA  TAB  IF  NEEDED    Garlic 0258 MG CAPS Take by mouth. 01/23/2016: Received from: [place]: Take by mouth.   glimepiride (AMARYL) 1 MG tablet Take 1 tablet (1 mg total) by mouth daily.    glucose blood (TRUE METRIX BLOOD GLUCOSE TEST) test strip 1 each by Other route in the morning and at bedtime. Dx E11.65    hydrALAZINE (APRESOLINE) 25 MG tablet TAKE 1 TABLET BY MOUTH THREE TIMES DAILY FOR BLOOD PRESSURE    HYDROcodone-acetaminophen (NORCO/VICODIN) 5-325 MG tablet Take 1 tablet by mouth every 6 (six) hours as needed for moderate pain or severe pain.    Insulin Pen Needle (PEN NEEDLES) 32G X 4 MM MISC 1 Units by Does not apply route daily. Use with victoza injections daily.    ketoconazole (NIZORAL) 2 % shampoo SHAMPOO AS DIRECTED  latanoprost (XALATAN) 0.005 % ophthalmic solution latanoprost 0.005 % eye drops  INSTILL 1 DROP INTO EACH EYE IN THE EVENING    linaclotide (LINZESS) 145 MCG CAPS capsule Take 1 capsule (145 mcg total) by mouth daily before breakfast.    liraglutide (VICTOZA) 18 MG/3ML SOPN INJECT 1.8 MG ONCE DAILY SUBCUTANEOUSLY    losartan (COZAAR) 25 MG tablet Take 25 mg by mouth daily. Patient takes 1/2 tab daily    meloxicam (MOBIC) 7.5 MG tablet TAKE 1 TABLET BY MOUTH ONCE DAILY FOR  GOUT  AND  ARTHRITIS    montelukast (SINGULAIR) 10 MG tablet Take 1 tablet by  mouth once daily    Omega-3 Fatty Acids (FISH OIL PO) Take by mouth. 01/23/2016: Received from: [place]: Take 1 capsule by mouth 2 (two) times daily.   polyethylene glycol (MIRALAX / GLYCOLAX) packet Take 17 g by mouth daily as needed.    senna (SENOKOT) 8.6 MG TABS tablet Take 1 tablet by mouth.    Sulfacetamide Sodium (SODIUM SULFACETAMIDE) 10 % SHAM Apply topically.    tizanidine (ZANAFLEX) 2 MG capsule Take one tab po qhs for spasm    TRUEplus Lancets 33G MISC TEST BLOOD SUGAR IN THE MORNING AND AT BEDTIME    Vitamin D, Ergocalciferol, (DRISDOL) 50000 units CAPS capsule  01/23/2016: Received from: [facility]   No facility-administered encounter medications on file as of 10/27/2020.    Have you seen any other providers since your last visit? Patient stated no.  Any changes in your medications or health? Patient stated no.  Any side effects from any medications? Patient stated no.   Do you have an symptoms or problems not managed by your medications? Patient stated she continues to keep having blisters coming up and she believes it may because of her medications that she's taken.   Any concerns about your health right now? Patient stated no.  Has your provider asked that you check blood pressure, blood sugar, or follow special diet at home? Patient stated she checks her blood sugar.  Do you get any type of exercise on a regular basis? Patient stated no.   Can you think of a goal you would like to reach for your health? Patient stated she would like to see improvement in her kidneys.   Do you have any problems getting your medications? Patient stated no.   Is there anything that you would like to discuss during the appointment? Patient stated no.  Please bring medications and supplements to appointment, patient reminded of her face to face appoint on 11/22/20 at 315 pm.   [place],  [place] Pharmacist Assistant [phone]

## 2020-10-28 ENCOUNTER — Other Ambulatory Visit: Payer: Self-pay

## 2020-10-28 MED ORDER — CITALOPRAM HYDROBROMIDE 20 MG PO TABS
ORAL_TABLET | ORAL | 0 refills | Status: DC
Start: 2020-10-28 — End: 2021-03-18

## 2020-10-29 ENCOUNTER — Ambulatory Visit: Payer: HMO

## 2020-10-30 ENCOUNTER — Telehealth: Payer: Self-pay

## 2020-10-30 DIAGNOSIS — I10 Essential (primary) hypertension: Secondary | ICD-10-CM | POA: Diagnosis not present

## 2020-10-30 DIAGNOSIS — N3281 Overactive bladder: Secondary | ICD-10-CM | POA: Diagnosis not present

## 2020-10-30 DIAGNOSIS — E1122 Type 2 diabetes mellitus with diabetic chronic kidney disease: Secondary | ICD-10-CM | POA: Diagnosis not present

## 2020-10-30 DIAGNOSIS — E210 Primary hyperparathyroidism: Secondary | ICD-10-CM | POA: Diagnosis not present

## 2020-10-30 DIAGNOSIS — D631 Anemia in chronic kidney disease: Secondary | ICD-10-CM | POA: Diagnosis not present

## 2020-10-30 DIAGNOSIS — N1832 Chronic kidney disease, stage 3b: Secondary | ICD-10-CM | POA: Diagnosis not present

## 2020-10-30 DIAGNOSIS — Z1231 Encounter for screening mammogram for malignant neoplasm of breast: Secondary | ICD-10-CM

## 2020-10-30 NOTE — Telephone Encounter (Signed)
Patient called wanting to know why she was referred to podiatrist. She stated she is diabetic, but not on insulin. Sent message to [employer]

## 2020-10-30 NOTE — Telephone Encounter (Signed)
Patient called wanting me to schedule her mammogram at the [facility] location. Last order I see for her was 07/27/17 still needing to be scheduled. Mammogram department doesn't see an order. Sent message to Alyssa to see if she will put in order-Toni

## 2020-10-31 NOTE — Telephone Encounter (Signed)
I initially put that referral in for her to get new diabetic shoes but then we put an order in for diabetic shoes so if she was able to get the shoes, we can cancel the referral.

## 2020-11-06 ENCOUNTER — Encounter: Payer: Self-pay | Admitting: Oncology

## 2020-11-10 NOTE — Telephone Encounter (Signed)
Per [facility], mammogram order needs to be changed to [id number]. Sent message to provider-Toni

## 2020-11-11 ENCOUNTER — Other Ambulatory Visit
Admission: RE | Admit: 2020-11-11 | Discharge: 2020-11-11 | Disposition: A | Discharge status: Home / Self Care | Payer: HMO | Attending: Urology | Admitting: Urology

## 2020-11-11 ENCOUNTER — Ambulatory Visit: Payer: HMO | Admitting: Urology

## 2020-11-11 ENCOUNTER — Encounter: Payer: Self-pay | Admitting: Oncology

## 2020-11-11 ENCOUNTER — Other Ambulatory Visit: Payer: Self-pay | Admitting: Internal Medicine

## 2020-11-11 ENCOUNTER — Other Ambulatory Visit: Payer: Self-pay | Admitting: *Deleted

## 2020-11-11 ENCOUNTER — Encounter: Payer: Self-pay | Admitting: Urology

## 2020-11-11 ENCOUNTER — Other Ambulatory Visit: Payer: Self-pay

## 2020-11-11 VITALS — BP 160/79 | HR 76 | Ht 63.0 in | Wt 227.0 lb

## 2020-11-11 DIAGNOSIS — N3281 Overactive bladder: Secondary | ICD-10-CM | POA: Insufficient documentation

## 2020-11-11 DIAGNOSIS — Z1231 Encounter for screening mammogram for malignant neoplasm of breast: Secondary | ICD-10-CM

## 2020-11-11 LAB — URINALYSIS, COMPLETE (UACMP) WITH MICROSCOPIC
Bilirubin Urine: NEGATIVE
Glucose, UA: NEGATIVE mg/dL
Hgb urine dipstick: NEGATIVE
Ketones, ur: NEGATIVE mg/dL
Nitrite: NEGATIVE
Protein, ur: 100 mg/dL — AB
Specific Gravity, Urine: 1.02 (ref 1.005–1.030)
pH: 5.5 (ref 5.0–8.0)

## 2020-11-11 LAB — BLADDER SCAN AMB NON-IMAGING

## 2020-11-11 NOTE — Telephone Encounter (Signed)
Mammogram scheduled 12/09/20 @ 1:30 at [facility] imaging per patient's request. I notified patient of appointment date & time-Frances Greene

## 2020-11-11 NOTE — Progress Notes (Signed)
11/11/20 3:07 PM   Frances Greene Montey Hora May 18, 1945 [id number]  CC: Overactive bladder  HPI: I saw [name] today for overactive bladder.  She has a number of comorbidities including morbid obesity with a BMI of 40, CHF, CKD, diabetes, and sleep apnea.  She reports at least a few months of urinary symptoms of urgency and frequency, as well as some urge incontinence and nocturia.  She feels like she is urinating every 30 minutes during the day and night.  She denies history of gross hematuria or UTIs.  She denies any dysuria or pelvic pain.  She takes Lasix as needed, but rarely.  She drinks coffee in the morning and then mostly water during the day.  Urinalysis today contaminated with squamous cells, but no RBCs or WBCs.  She has never tried any OAB medications.   PMH: Past Medical History:  Diagnosis Date   Arthritis    Bronchitis    CHF (congestive heart failure) (HCC)    Chronic kidney disease    decreased kidney function   Diabetes mellitus without complication (HCC)    Gout    Hypertension    Multiple allergies    Shortness of breath dyspnea    w/ exertion   Sleep apnea    CPAP   Swelling of both lower extremities    Wheezing     Surgical History: Past Surgical History:  Procedure Laterality Date   ABDOMINAL HYSTERECTOMY     BREAST BIOPSY Left 1989   neg   CATARACT EXTRACTION W/PHACO Right 05/21/2015   Procedure: CATARACT EXTRACTION PHACO AND INTRAOCULAR LENS PLACEMENT ([place]);  Surgeon: Milus Height, MD;  Location: [facility] ORS;  Service: Ophthalmology;  Laterality: Right;  [place]: AP%: 10.0 CDE: 9.53   COLONOSCOPY     JOINT REPLACEMENT Bilateral    knee   PARS PLANA VITRECTOMY Right 05/21/2015   Procedure: PARS PLANA VITRECTOMY WITH 25 GAUGE;  Surgeon: Milus Height, MD;  Location: [facility] ORS;  Service: Ophthalmology;  Laterality: Right;  Lot # [id number] Endo laser: Watts: 300 Pulse Duration: 200 Total Pulses:143    shoulder surgery        Family  History: Family History  Problem Relation Age of Onset   Breast cancer Cousin    Breast cancer Other    Hypertension Mother    Diabetes Mother    Hypertension Father    Diabetes Father    Alcohol abuse Father    Aneurysm Sister    Alcohol abuse Sister    Stroke Sister        died from stroke   Hepatitis Brother    Hypertension Sister        dialysis   Diabetes Sister    Alcoholism Brother    Alcoholism Brother        dies from head injury from a fall.  not an alcoholic,   Cancer Brother    Diabetes Brother    Hypertension Brother    Diabetes Brother    Cancer Brother    Hypertension Brother    Stroke Brother    Alcoholism Brother     Social History:  reports that she has quit smoking. Her smoking use included cigarettes. She has never used smokeless tobacco. She reports that she does not drink alcohol and does not use drugs.  Physical Exam: BP (!) 160/79   Pulse 76   Ht 5\' 3"  (1.6 m)   Wt 227 lb (103 kg)   BMI 40.21 kg/m    Constitutional:  Alert and oriented, No acute distress. Cardiovascular: No clubbing, cyanosis, or edema. Respiratory: Normal respiratory effort, no increased work of breathing. GI: Abdomen is soft, nontender, nondistended, no abdominal masses   Laboratory Data: Reviewed, see HPI  Pertinent Imaging: I have personally viewed and interpreted the renal ultrasound that shows no hydronephrosis or bladder abnormalities.  Assessment & Plan:   75 year old comorbid female with morbid obesity, CHF, diabetes, CKD, sleep apnea with overactive bladder symptoms of urgency, frequency, and urge incontinence.  We discussed that overactive bladder (OAB) is not a disease, but is a symptom complex that is generally not life-threatening.  Symptoms typically include urinary urgency, frequency, and urge incontinence.  There are numerous treatment options, however there are risks and benefits with both medical and surgical management.  First-line treatment is  behavioral therapies including bladder training, pelvic floor muscle training, and fluid management.  Second line treatments include oral antimuscarinics(Ditropan er, Trospium) and beta-3 agonist (Mybetriq). There is typically a period of medication trial (4-8 weeks) to find the optimal therapy and dosing. If symptoms are bothersome despite the above management, third line options include intra-detrusor botox, peripheral tibial nerve stimulation (PTNS), and interstim (SNS). These are more invasive treatments with higher side effect profile, but may improve quality of life for patients with severe OAB symptoms.   Not a good candidate for anticholinergics with her age and frailty, samples of Myrbetriq 50 mg x 4 weeks given, RTC with PA symptom check 1 month   Frances Madrid, MD 11/11/2020  [facility] [address], [place], [place] [address] [phone]

## 2020-11-11 NOTE — Patient Instructions (Signed)

## 2020-11-12 ENCOUNTER — Other Ambulatory Visit: Payer: Self-pay | Admitting: Nurse Practitioner

## 2020-11-13 ENCOUNTER — Ambulatory Visit (INDEPENDENT_AMBULATORY_CARE_PROVIDER_SITE_OTHER): Payer: HMO | Admitting: Nurse Practitioner

## 2020-11-13 ENCOUNTER — Other Ambulatory Visit: Payer: Self-pay

## 2020-11-13 ENCOUNTER — Encounter: Payer: Self-pay | Admitting: Nurse Practitioner

## 2020-11-13 VITALS — BP 140/70 | HR 72 | Temp 98.1°F | Resp 16 | Ht 63.0 in | Wt 225.8 lb

## 2020-11-13 DIAGNOSIS — G479 Sleep disorder, unspecified: Secondary | ICD-10-CM | POA: Diagnosis not present

## 2020-11-13 DIAGNOSIS — I10 Essential (primary) hypertension: Secondary | ICD-10-CM

## 2020-11-13 DIAGNOSIS — L282 Other prurigo: Secondary | ICD-10-CM | POA: Diagnosis not present

## 2020-11-13 DIAGNOSIS — N3281 Overactive bladder: Secondary | ICD-10-CM | POA: Diagnosis not present

## 2020-11-13 DIAGNOSIS — R0609 Other forms of dyspnea: Secondary | ICD-10-CM

## 2020-11-13 MED ORDER — BETAMETHASONE DIPROPIONATE 0.05 % EX CREA: TOPICAL_CREAM | Freq: Two times a day (BID) | CUTANEOUS | 1 refills | Status: DC

## 2020-11-13 MED ORDER — TRAZODONE HCL 50 MG PO TABS: 25.0000 mg | ORAL_TABLET | Freq: Every evening | ORAL | 3 refills | Status: DC | PRN

## 2020-11-13 MED ORDER — BISOPROLOL-HYDROCHLOROTHIAZIDE 5-6.25 MG PO TABS: 1.0000 | ORAL_TABLET | Freq: Every day | ORAL | 2 refills | Status: DC

## 2020-11-13 NOTE — Progress Notes (Signed)
[facility] [place], [place] [address]  Internal MEDICINE  Office Visit Note  Patient Name: Frances Greene  [id number]  [id number]  Date of Service: 11/13/2020  Chief Complaint  Patient presents with   Acute Visit    Blisters popping up on skin legs and back      HPI Frances Greene presents for an acute sick visit for Blisters, itchy skin ,she would stratch and a blister would pop up, started on lower legs, then move up the leg and to the back. Randomly popping up, Only have 1 right now. Will come in for nurse visit blister pops up again Brother is in hospice, She is not sleeping Urology started her on mybetriq for overactive bladder. Patient also reports having issues with SOB with minimal exertion.   Current Medication:  Outpatient Encounter Medications as of 11/13/2020  Medication Sig Note   acetaminophen (TYLENOL) 325 MG tablet Take 650 mg by mouth every 6 (six) hours as needed.    allopurinol (ZYLOPRIM) 300 MG tablet Take 1 tablet (300 mg total) by mouth daily. 12/04/2020: Patient reports taking 1/2 tab daily   AMBULATORY NON FORMULARY MEDICATION Medication Name: geni kot natural veggie laxitive    aspirin EC 325 MG tablet Take 325 mg by mouth.    atorvastatin (LIPITOR) 10 MG tablet Take 1 tablet (10 mg total) by mouth daily.    bisoprolol-hydrochlorothiazide (ZIAC) 5-6.25 MG tablet Take 1 tablet by mouth daily.    Blood Glucose Calibration (TRUE METRIX LEVEL 1) Low SOLN Use as directed    cinacalcet (SENSIPAR) 30 MG tablet     citalopram (CELEXA) 20 MG tablet TAKE 1 TABLET BY MOUTH ONCE DAILY FOR DEPRESSION 12/04/2020: Pt states taking 1/2 tab daily   fluticasone-salmeterol (ADVAIR) 100-50 MCG/ACT AEPB Inhale 1 puff into the lungs 2 (two) times daily.    furosemide (LASIX) 40 MG tablet TAKE 1 TABLET BY MOUTH ONCE DAILY AND  TAKE  XTRA  TAB  IF  NEEDED 12/04/2020: Patient reports taking only as needed for fluid   Garlic 7588 MG CAPS Take by mouth.     glimepiride (AMARYL) 1 MG tablet Take 1 tablet (1 mg total) by mouth daily. 12/04/2020: Patient reports taking as needed for blood sugars 140 or higher   glucose blood (TRUE METRIX BLOOD GLUCOSE TEST) test strip 1 each by Other route in the morning and at bedtime. Dx E11.65    hydrALAZINE (APRESOLINE) 25 MG tablet TAKE 1 TABLET BY MOUTH THREE TIMES DAILY FOR BLOOD PRESSURE    HYDROcodone-acetaminophen (NORCO/VICODIN) 5-325 MG tablet Take 1 tablet by mouth every 6 (six) hours as needed for moderate pain or severe pain. (Patient not taking: Reported on 12/04/2020)    Insulin Pen Needle (PEN NEEDLES) 32G X 4 MM MISC 1 Units by Does not apply route daily. Use with victoza injections daily.    ketoconazole (NIZORAL) 2 % shampoo SHAMPOO AS DIRECTED    latanoprost (XALATAN) 0.005 % ophthalmic solution latanoprost 0.005 % eye drops  INSTILL 1 DROP INTO EACH EYE IN THE EVENING    liraglutide (VICTOZA) 18 MG/3ML SOPN INJECT 1.8 MG ONCE DAILY SUBCUTANEOUSLY    losartan (COZAAR) 25 MG tablet Take 25 mg by mouth daily. Patient takes 1/2 tab daily    meloxicam (MOBIC) 7.5 MG tablet TAKE 1 TABLET BY MOUTH ONCE DAILY FOR  GOUT  AND  ARTHRITIS    Mirabegron (MYRBETRIQ PO) Take by mouth.    montelukast (SINGULAIR) 10 MG tablet Take 1 tablet  by mouth once daily    Omega-3 Fatty Acids (FISH OIL PO) Take by mouth.    senna (SENOKOT) 8.6 MG TABS tablet Take 1 tablet by mouth.    Sulfacetamide Sodium (SODIUM SULFACETAMIDE) 10 % SHAM Apply topically.    tizanidine (ZANAFLEX) 2 MG capsule Take one tab po qhs for spasm    traZODone (DESYREL) 50 MG tablet Take 0.5-1 tablets (25-50 mg total) by mouth at bedtime as needed for sleep.    TRUEplus Lancets 33G MISC TEST BLOOD SUGAR IN THE MORNING AND AT BEDTIME    Vitamin D, Ergocalciferol, (DRISDOL) 50000 units CAPS capsule     [DISCONTINUED] betamethasone dipropionate 0.05 % cream Apply topically 2 (two) times daily.    [DISCONTINUED] bisoprolol-hydrochlorothiazide (ZIAC)  2.5-6.25 MG tablet Take 1 tablet by mouth twice daily    [DISCONTINUED] cyanocobalamin 1000 MCG tablet Take 1,000 mcg by mouth daily. Take Monday, Wednesday and Friday    [DISCONTINUED] diphenhydrAMINE (BENADRYL) 25 MG tablet Take 25 mg by mouth daily.    [DISCONTINUED] linaclotide (LINZESS) 145 MCG CAPS capsule Take 1 capsule (145 mcg total) by mouth daily before breakfast.    [DISCONTINUED] polyethylene glycol (MIRALAX / GLYCOLAX) packet Take 17 g by mouth daily as needed.    betamethasone dipropionate 0.05 % cream Apply topically 2 (two) times daily.    No facility-administered encounter medications on file as of 11/13/2020.      Medical History: Past Medical History:  Diagnosis Date   Arthritis    Asthma    Bronchitis    CHF (congestive heart failure) (HCC)    Chronic kidney disease    decreased kidney function   Diabetes mellitus without complication (HCC)    Gout    Hypertension    Multiple allergies    Shortness of breath dyspnea    w/ exertion   Sleep apnea    CPAP   Swelling of both lower extremities    Wheezing      Vital Signs: BP 140/70 Comment: 163/64  Pulse 72   Temp 98.1 F (36.7 C)   Resp 16   Ht $R'5\' 3"'Jq$  (1.6 m)   Wt 225 lb 12.8 oz (102.4 kg)   SpO2 97%   BMI 40.00 kg/m    Review of Systems  Constitutional:  Negative for chills, fatigue and unexpected weight change.  HENT:  Negative for congestion, rhinorrhea, sneezing and sore throat.   Eyes:  Negative for redness.  Respiratory:  Negative for cough, chest tightness and shortness of breath.   Cardiovascular:  Negative for chest pain and palpitations.  Gastrointestinal:  Negative for abdominal pain, constipation, diarrhea, nausea and vomiting.  Genitourinary:  Negative for dysuria and frequency.  Musculoskeletal:  Negative for arthralgias, back pain, joint swelling and neck pain.  Skin:  Negative for rash.  Neurological: Negative.  Negative for tremors and numbness.  Hematological:  Negative for  adenopathy. Does not bruise/bleed easily.  Psychiatric/Behavioral:  Negative for behavioral problems (Depression), sleep disturbance and suicidal ideas. The patient is not nervous/anxious.    Physical Exam Vitals reviewed.  Constitutional:      General: She is not in acute distress.    Appearance: Normal appearance. She is obese. She is not ill-appearing.  HENT:     Head: Normocephalic and atraumatic.  Eyes:     Pupils: Pupils are equal, round, and reactive to light.  Cardiovascular:     Rate and Rhythm: Normal rate and regular rhythm.  Pulmonary:     Effort: Pulmonary effort is  normal. No respiratory distress.  Neurological:     Mental Status: She is alert and oriented to person, place, and time.     Cranial Nerves: No cranial nerve deficit.     Coordination: Coordination normal.     Gait: Gait normal.  Psychiatric:        Mood and Affect: Mood normal.        Behavior: Behavior normal.      Assessment/Plan: 1. Essential hypertension Dose increased to 5-6.25 mg from 2.5-6.25 mg. Follow up in 4 weeks.  - bisoprolol-hydrochlorothiazide (ZIAC) 5-6.25 MG tablet; Take 1 tablet by mouth daily.  Dispense: 30 tablet; Refill: 2  2. Dyspnea on minimal exertion Labs and echo ordered. Follow up to discuss echo and lab results.  - ECHOCARDIOGRAM COMPLETE; Future - CBC with Differential/Platelet - Pro b natriuretic peptide (BNP)[facility]) - C-reactive protein - Sed Rate (ESR)  3. Sleep disturbance Trazodone prescribed for trouble sleeping.  - traZODone (DESYREL) 50 MG tablet; Take 0.5-1 tablets (25-50 mg total) by mouth at bedtime as needed for sleep.  Dispense: 30 tablet; Refill: 3  4. Overactive bladder Followed by urology - Mirabegron (MYRBETRIQ PO); Take by mouth.  5. Pruritic rash Betamethasone cream prescribed. - betamethasone dipropionate 0.05 % cream; Apply topically 2 (two) times daily.  Dispense: 45 g; Refill: 1   General Counseling: [place]  verbalizes understanding of the findings of todays visit and agrees with plan of treatment. I have discussed any further diagnostic evaluation that may be needed or ordered today. We also reviewed her medications today. she has been encouraged to call the office with any questions or concerns that should arise related to todays visit.    Counseling:    Orders Placed This Encounter  Procedures   CBC with Differential/Platelet   Pro b natriuretic peptide (BNP)[facility])   C-reactive protein   Sed Rate (ESR)   ECHOCARDIOGRAM COMPLETE     Meds ordered this encounter  Medications   betamethasone dipropionate 0.05 % cream    Sig: Apply topically 2 (two) times daily.    Dispense:  45 g    Refill:  1   traZODone (DESYREL) 50 MG tablet    Sig: Take 0.5-1 tablets (25-50 mg total) by mouth at bedtime as needed for sleep.    Dispense:  30 tablet    Refill:  3   bisoprolol-hydrochlorothiazide (ZIAC) 5-6.25 MG tablet    Sig: Take 1 tablet by mouth daily.    Dispense:  30 tablet    Refill:  2    Note dose change, discontinue 2.5-6.25 mg dose    Return in about 4 weeks (around 12/11/2020) for F/U, BP check, eval new med, Elidia Bonenfant PCP , Echo @ [facility].  [place] Controlled Substance Database was reviewed by me for overdose risk score (ORS)  Time spent:30 Minutes Time spent with patient included reviewing progress notes, labs, imaging studies, and discussing plan for follow up.   This patient was seen by Jonetta Osgood, FNP-C in collaboration with Dr. Clayborn Bigness as a part of collaborative care agreement.  Adamari Frede R. Valetta Fuller, MSN, FNP-C Internal Medicine

## 2020-11-19 ENCOUNTER — Ambulatory Visit: Payer: HMO

## 2020-11-19 ENCOUNTER — Other Ambulatory Visit: Payer: Self-pay

## 2020-11-19 DIAGNOSIS — R0609 Other forms of dyspnea: Secondary | ICD-10-CM

## 2020-11-19 DIAGNOSIS — R0602 Shortness of breath: Secondary | ICD-10-CM | POA: Diagnosis not present

## 2020-11-20 NOTE — Progress Notes (Deleted)
Chronic Care Management Pharmacy Note  11/20/2020 Name:  Frances Greene MRN:  [id number] DOB:  [birth year]  Summary: ***  Recommendations/Changes made from today's visit: ***  Plan: ***   Subjective: Frances Greene is an 75 y.o. year old female who is a primary patient of Abernathy, Yetta Flock, NP.  The CCM team was consulted for assistance with disease management and care coordination needs.    {CCMTELEPHONEFACETOFACE:21091510} for {CCMINITIALFOLLOWUPCHOICE:21091511} in response to provider referral for pharmacy case management and/or care coordination services.   Consent to Services:  {CCMCONSENTOPTIONS:25074}  Patient Care Team: Jonetta Osgood, NP as PCP - General (Nurse Practitioner) Edythe Clarity, [facility] as Pharmacist (Pharmacist)  Recent office visits: ***  Recent consult visits: [facility] visits: {Hospital DC Yes/No:25215}   Objective:  Lab Results  Component Value Date   CREATININE 2.25 (H) 03/26/2020   BUN 32 (H) 03/26/2020   GFRNONAA 26 (L) 08/24/2019   GFRAA 30 (L) 08/24/2019   NA 143 03/26/2020   K 3.9 03/26/2020   CALCIUM 8.7 03/26/2020   CO2 20 03/26/2020   GLUCOSE 109 (H) 03/26/2020    Lab Results  Component Value Date/Time   HGBA1C 5.8 (A) 09/04/2020 09:18 AM   HGBA1C 6.0 (A) 04/02/2020 09:26 AM   MICROALBUR 30-300 01/01/2020 02:39 PM    Last diabetic Eye exam: No results found for: HMDIABEYEEXA  Last diabetic Foot exam: No results found for: HMDIABFOOTEX   Lab Results  Component Value Date   CHOL 129 08/24/2019   HDL 54 08/24/2019   LDLCALC 59 08/24/2019   TRIG 84 08/24/2019    Hepatic Function Latest Ref Rng & Units 03/26/2020 08/24/2019 07/24/2018  Total Protein 6.0 - 8.5 g/dL 6.3 6.2 6.0  Albumin 3.7 - 4.7 g/dL 4.4 4.1 4.2  AST 0 - 40 IU/L $Remov'13 12 11  'ueTDWN$ ALT 0 - 32 IU/L $Remov'8 8 7  'oguWqX$ Alk Phosphatase 44 - 121 IU/L 72 66 88  Total Bilirubin 0.0 - 1.2 mg/dL 0.4 0.5 0.2    Lab Results  Component Value Date/Time   TSH 3.436  03/12/2020 03:59 PM   TSH 3.480 08/24/2019 03:02 PM   TSH 3.900 07/24/2018 10:39 AM   FREET4 1.20 08/24/2019 03:02 PM   FREET4 1.07 07/24/2018 10:39 AM    CBC Latest Ref Rng & Units 07/30/2020 05/21/2020 03/26/2020  WBC 4.0 - 10.5 K/uL 6.2 5.9 5.3  Hemoglobin 12.0 - 15.0 g/dL 10.6(L) 10.8(L) 11.0(L)  Hematocrit 36.0 - 46.0 % 32.7(L) 33.2(L) 33.1(L)  Platelets 150 - 400 K/uL 151 150 151    No results found for: VD25OH  Clinical ASCVD: {YES/NO:21197} The ASCVD Risk score (Arnett DK, et al., 2019) failed to calculate for the following reasons:   The valid total cholesterol range is 130 to 320 mg/dL    Depression screen [facility] 2/9 09/04/2020 09/04/2020 04/01/2020  Decreased Interest 0 0 0  Down, Depressed, Hopeless 0 0 0  PHQ - 2 Score 0 0 0     ***Other: (CHADS2VASc if Afib, MMRC or CAT for COPD, ACT, DEXA)  Social History   Tobacco Use  Smoking Status Former   Types: Cigarettes  Smokeless Tobacco Never  Tobacco Comments   4 cigarettes quit 20 years ago   BP Readings from Last 3 Encounters:  11/13/20 (!) 163/64  11/11/20 (!) 160/79  09/04/20 135/70   Pulse Readings from Last 3 Encounters:  11/13/20 72  11/11/20 76  09/04/20 70   Wt Readings from Last 3 Encounters:  11/13/20 225  lb 12.8 oz (102.4 kg)  11/11/20 227 lb (103 kg)  09/04/20 226 lb 6.4 oz (102.7 kg)   BMI Readings from Last 3 Encounters:  11/13/20 40.00 kg/m  11/11/20 40.21 kg/m  09/04/20 41.41 kg/m    Assessment/Interventions: Review of patient past medical history, allergies, medications, health status, including review of consultants reports, laboratory and other test data, was performed as part of comprehensive evaluation and provision of chronic care management services.   SDOH:  (Social Determinants of Health) assessments and interventions performed: {yes/no:20286}  SDOH Screenings   Alcohol Screen: Low Risk    Last Alcohol Screening Score (AUDIT): 0  Depression (PHQ2-9): Low Risk    PHQ-2  Score: 0  Financial Resource Strain: Not on file  Food Insecurity: Not on file  Housing: Not on file  Physical Activity: Not on file  Social Connections: Not on file  Stress: Not on file  Tobacco Use: Medium Risk   Smoking Tobacco Use: Former   Smokeless Tobacco Use: Never   Passive Exposure: Not on file  Transportation Needs: Not on file    CCM Care Plan  Allergies  Allergen Reactions   Ramipril Cough and Other (See Comments)    Other reaction(s): Cough Other reaction(s): Cough Other reaction(s): Other (See Comments) Other reaction(s): Cough Other reaction(s): Cough Other reaction(s): Cough     Medications Reviewed Today     Reviewed by Corlis Hove (Certified Medical Assistant) on 11/13/20 at 1501  Med List Status: <None>   Medication Order Taking? Sig Documenting Provider Last Dose Status Informant  acetaminophen (TYLENOL) 325 MG tablet [id number] Yes Take 650 mg by mouth every 6 (six) hours as needed. [provider] Taking Active            Med Note Despina Hidden   Tue Nov 11, 2020  2:37 PM)    allopurinol (ZYLOPRIM) 300 MG tablet [id number] Yes Take 1 tablet (300 mg total) by mouth daily. Lavera Guise, MD Taking Active   AMBULATORY Baruch Gouty MEDICATION [id number] Yes Medication Name: geni kot natural veggie laxitive [provider] Taking Active   aspirin EC 325 MG tablet [id number] Yes Take 325 mg by mouth. [provider] Taking Active            Med Note Despina Hidden   Tue Nov 11, 2020  2:37 PM)    atorvastatin (LIPITOR) 10 MG tablet [id number] Yes Take 1 tablet (10 mg total) by mouth daily. Lavera Guise, MD Taking Active   betamethasone dipropionate 0.05 % cream [id number] Yes Apply topically 2 (two) times daily. Kendell Bane, NP Taking Active   bisoprolol-hydrochlorothiazide [facility]) 2.5-6.25 MG tablet [id number] Yes Take 1 tablet by mouth twice daily Lavera Guise, MD Taking Active   Blood Glucose Calibration (TRUE  METRIX LEVEL 1) Low SOLN [id number] Yes Use as directed Kendell Bane, NP Taking Active   cinacalcet (SENSIPAR) 30 MG tablet [id number] Yes  [provider] Taking Active   citalopram (CELEXA) 20 MG tablet [id number] Yes TAKE 1 TABLET BY MOUTH ONCE DAILY FOR DEPRESSION Jonetta Osgood, NP Taking Active   cyanocobalamin 1000 MCG tablet [id number] Yes Take 1,000 mcg by mouth daily. Take Monday, Wednesday and Friday [provider] Taking Active   diphenhydrAMINE (BENADRYL) 25 MG tablet [id number] Yes Take 25 mg by mouth daily. [provider] Taking Active   fluticasone-salmeterol (ADVAIR) 100-50 MCG/ACT AEPB [id number] Yes Inhale 1 puff into the lungs 2 (two) times daily. Jonetta Osgood, NP Taking  Active   furosemide (LASIX) 40 MG tablet [id number] Yes TAKE 1 TABLET BY MOUTH ONCE DAILY AND  TAKE  XTRA  TAB  IF  NEEDED Kendell Bane, NP Taking Active   Garlic 4696 MG CAPS [id number] Yes Take by mouth. [provider] Taking Active            Med Note Despina Hidden   Tue Nov 11, 2020  2:37 PM)    glimepiride (AMARYL) 1 MG tablet [id number] Yes Take 1 tablet (1 mg total) by mouth daily. Kendell Bane, NP Taking Active   glucose blood (TRUE METRIX BLOOD GLUCOSE TEST) test strip [id number] Yes 1 each by Other route in the morning and at bedtime. Dx E11.65 Lavera Guise, MD Taking Active   hydrALAZINE (APRESOLINE) 25 MG tablet [id number] Yes TAKE 1 TABLET BY MOUTH THREE TIMES DAILY FOR BLOOD PRESSURE Lavera Guise, MD Taking Active   HYDROcodone-acetaminophen (NORCO/VICODIN) 5-325 MG tablet [id number] Yes Take 1 tablet by mouth every 6 (six) hours as needed for moderate pain or severe pain. Delman Kitten, MD Taking Active   Insulin Pen Needle (PEN NEEDLES) 32G X 4 MM MISC [id number] Yes 1 Units by Does not apply route daily. Use with victoza injections daily. Ronnell Freshwater, NP Taking Active   ketoconazole (NIZORAL) 2 % shampoo [id number] Yes SHAMPOO AS DIRECTED  Lavera Guise, MD Taking Active   latanoprost (XALATAN) 0.005 % ophthalmic solution [id number] Yes latanoprost 0.005 % eye drops  INSTILL 1 DROP INTO EACH EYE IN THE EVENING [provider] Taking Active   linaclotide [facility]) 145 MCG CAPS capsule [id number] Yes Take 1 capsule (145 mcg total) by mouth daily before breakfast. Lavera Guise, MD Taking Active   liraglutide (VICTOZA) 18 MG/3ML SOPN [id number] Yes INJECT 1.8 MG ONCE DAILY SUBCUTANEOUSLY Lavera Guise, MD Taking Active   losartan (COZAAR) 25 MG tablet [id number] Yes Take 25 mg by mouth daily. Patient takes 1/2 tab daily [provider] Taking Active   meloxicam (MOBIC) 7.5 MG tablet [id number] Yes TAKE 1 TABLET BY MOUTH ONCE DAILY FOR  GOUT  AND  ARTHRITIS Lavera Guise, MD Taking Active   montelukast (SINGULAIR) 10 MG tablet [id number] Yes Take 1 tablet by mouth once daily Jonetta Osgood, NP Taking Active   Omega-3 Fatty Acids (FISH OIL PO) [id number] Yes Take by mouth. [provider] Taking Active            Med Note Despina Hidden   Tue Nov 11, 2020  2:37 PM)    polyethylene glycol [facility] / GLYCOLAX) packet [id number] Yes Take 17 g by mouth daily as needed. [provider] Taking Active   senna (SENOKOT) 8.6 MG TABS tablet [id number] Yes Take 1 tablet by mouth. [provider] Taking Active   Sulfacetamide Sodium (SODIUM SULFACETAMIDE) 10 % SHAM [id number] Yes Apply topically. [provider] Taking Active   tizanidine (ZANAFLEX) 2 MG capsule [id number] Yes Take one tab po qhs for spasm Lavera Guise, MD Taking Active   TRUEplus Lancets 33G [place] [id number] Yes TEST BLOOD SUGAR IN THE MORNING AND AT BEDTIME Lavera Guise, MD Taking Active   Vitamin D, Ergocalciferol, (DRISDOL) 50000 units CAPS capsule [id number] Yes  [provider] Taking Active            Med Note Despina Hidden   Tue Nov 11, 2020  2:38 PM)  Patient Active Problem List   Diagnosis  Date Noted   B12 deficiency 03/19/2020   Anemia in stage 3b chronic kidney disease ([place]) 03/19/2020   Acute bilateral low back pain with right-sided sciatica 11/12/2018   Closed fracture of right proximal humerus 11/12/2018   Type 2 diabetes mellitus with hyperglycemia ([place]) 10/28/2018   Flu vaccine need 10/28/2018   Pain due to onychomycosis of toenails of both feet 07/17/2018   Type 2 diabetes mellitus with vascular disease ([place]) 07/17/2018   Other fatigue 11/14/2017   Moderate episode of recurrent major depressive disorder ([place]) 11/14/2017   Chronic allergic rhinitis 11/14/2017   Primary insomnia 11/14/2017   Uncontrolled type 2 diabetes mellitus with hyperglycemia ([place]) 09/18/2017   Acute pain of right shoulder 09/18/2017   Type 2 diabetes mellitus with chronic kidney disease ([place]) 01/19/2017   Anosmia 01/19/2017   Cervicalgia 01/19/2017   Vitamin D deficiency 01/19/2017   Hypoglycemia 01/19/2017   OSA (obstructive sleep apnea) 01/19/2017   Essential hypertension, benign 01/19/2017   Mixed hyperlipidemia 01/19/2017   Anemia, unspecified 01/19/2017   Edema 01/19/2017   SOB (shortness of breath) 01/19/2017   Gout 01/19/2017   Morbid obesity ([place]) 01/19/2017   Stage 3 chronic kidney disease ([place]) 01/19/2017   Hypercalcemia 01/19/2017   Iron deficiency anemia 01/14/2016   Acute pain of right knee 04/23/2015   Left wrist pain 08/12/2014   Status post bilateral unicompartmental knee replacement 11/13/2013   Arthritis, senescent 08/02/2013    Immunization History  Administered Date(s) Administered   Influenza Inj Mdck Quad Pf 10/17/2018, 10/09/2019   Influenza, High Dose Seasonal PF 12/08/2017   Influenza-Unspecified 10/12/2016, 10/04/2020   PFIZER(Purple Top)SARS-COV-2 Vaccination 04/20/2019, 05/18/2019, 11/14/2019   PNEUMOCOCCAL CONJUGATE-20 10/04/2020   Pneumococcal Conjugate-13 03/10/2018   Pneumococcal-Unspecified 11/27/2015   Tetanus 10/04/2020   Zoster Recombinat  (Shingrix) 10/04/2020    Conditions to be addressed/monitored:  {USCCMDZASSESSMENTOPTIONS:23563}  There are no care plans that you recently modified to display for this patient.    Medication Assistance: {MEDASSISTANCEINFO:25044}  Compliance/Adherence/Medication fill history: Care Gaps: ***  Star-Rating Drugs: ***  Patient's preferred pharmacy is:  [facility] [address] [place] [address] Phone: [phone] Fax: [phone]  [place], [place] [address] Phone: [phone] Fax: [phone]  Uses pill box? {Yes or If no, why not?:20788} Pt endorses ***% compliance  We discussed: {Pharmacy options:24294} Patient decided to: {US Pharmacy Plan:23885}  Care Plan and Follow Up Patient Decision:  {FOLLOWUP:24991}  Plan: {CM FOLLOW UP LMRA:15183}  ***  Current Barriers:  {pharmacybarriers:24917}  Pharmacist Clinical Goal(s):  Patient will {PHARMACYGOALCHOICES:24921} through collaboration with PharmD and provider.   Interventions: 1:1 collaboration with Jonetta Osgood, NP regarding development and update of comprehensive plan of care as evidenced by provider attestation and co-signature Inter-disciplinary care team collaboration (see longitudinal plan of care) Comprehensive medication review performed; medication list updated in electronic medical record  {CCM PHARMD DISEASE STATES:25130}  Patient Goals/Self-Care Activities Patient will:  - {pharmacypatientgoals:24919}  Follow Up Plan: {CM FOLLOW UP UPBD:57897}

## 2020-11-24 ENCOUNTER — Telehealth: Payer: Self-pay | Admitting: Student-PharmD

## 2020-11-24 ENCOUNTER — Telehealth: Payer: Self-pay | Admitting: Pharmacist

## 2020-11-24 NOTE — Progress Notes (Addendum)
Chronic Care Management Pharmacy Assistant   Name: Frances Greene  MRN: [id number] DOB: 1945-10-12  Frances Greene is an 75 y.o. year old female who presents for his initial CCM visit with the clinical pharmacist.  Reason for Encounter: Chart Prep    Conditions to be addressed/monitored: HTN, Type 2 DM, Vitamin D, Iron Deficiency, HLD.  Primary concerns for visit include:  HTN, Type 2 DM.   Recent office visits:  11/13/20 Jonetta Osgood, NP. For acute visit. STARTED Trazodone 25-50 mg oral at bedtime PRN CHANGED Bisoprolol-Hydrochlorothiazide to 5-6.25 daily STOPPED Cyanocobalamin, Diphenhydramine, Linaclotide, MIRALAX.   Recent consult visits:  11/11/20 [facility] ([facility] Hours) For Op Visit. Billey Co, MD. 11/11/20 Urology Billey Co, MD. Per note:samples of Myrbetriq 50 mg x 4 weeks given 10/30/20 Nephrology Anthonette Legato, MD For follow-up. No medication changes.  Hospital visits:  None since last chart prep on 10/27/20  Medication History: Atorvastatin 10 mg 11/05/20 90 DS Glimepiride 1 mg 10/06/19 90 DS  Losartan 25 mg 10/07/20 90 DS  Medications: Outpatient Encounter Medications as of 11/24/2020  Medication Sig   acetaminophen (TYLENOL) 325 MG tablet Take 650 mg by mouth every 6 (six) hours as needed.   allopurinol (ZYLOPRIM) 300 MG tablet Take 1 tablet (300 mg total) by mouth daily.   AMBULATORY NON FORMULARY MEDICATION Medication Name: geni kot natural veggie laxitive   aspirin EC 325 MG tablet Take 325 mg by mouth.   atorvastatin (LIPITOR) 10 MG tablet Take 1 tablet (10 mg total) by mouth daily.   betamethasone dipropionate 0.05 % cream Apply topically 2 (two) times daily.   bisoprolol-hydrochlorothiazide (ZIAC) 5-6.25 MG tablet Take 1 tablet by mouth daily.   Blood Glucose Calibration (TRUE METRIX LEVEL 1) Low SOLN Use as directed   cinacalcet (SENSIPAR) 30 MG tablet    citalopram (CELEXA) 20 MG tablet TAKE 1 TABLET BY MOUTH  ONCE DAILY FOR DEPRESSION   fluticasone-salmeterol (ADVAIR) 100-50 MCG/ACT AEPB Inhale 1 puff into the lungs 2 (two) times daily.   furosemide (LASIX) 40 MG tablet TAKE 1 TABLET BY MOUTH ONCE DAILY AND  TAKE  XTRA  TAB  IF  NEEDED   Garlic 2440 MG CAPS Take by mouth.   glimepiride (AMARYL) 1 MG tablet Take 1 tablet (1 mg total) by mouth daily.   glucose blood (TRUE METRIX BLOOD GLUCOSE TEST) test strip 1 each by Other route in the morning and at bedtime. Dx E11.65   hydrALAZINE (APRESOLINE) 25 MG tablet TAKE 1 TABLET BY MOUTH THREE TIMES DAILY FOR BLOOD PRESSURE   HYDROcodone-acetaminophen (NORCO/VICODIN) 5-325 MG tablet Take 1 tablet by mouth every 6 (six) hours as needed for moderate pain or severe pain.   Insulin Pen Needle (PEN NEEDLES) 32G X 4 MM MISC 1 Units by Does not apply route daily. Use with victoza injections daily.   ketoconazole (NIZORAL) 2 % shampoo SHAMPOO AS DIRECTED   latanoprost (XALATAN) 0.005 % ophthalmic solution latanoprost 0.005 % eye drops  INSTILL 1 DROP INTO EACH EYE IN THE EVENING   liraglutide (VICTOZA) 18 MG/3ML SOPN INJECT 1.8 MG ONCE DAILY SUBCUTANEOUSLY   losartan (COZAAR) 25 MG tablet Take 25 mg by mouth daily. Patient takes 1/2 tab daily   meloxicam (MOBIC) 7.5 MG tablet TAKE 1 TABLET BY MOUTH ONCE DAILY FOR  GOUT  AND  ARTHRITIS   Mirabegron (MYRBETRIQ PO) Take by mouth.   montelukast (SINGULAIR) 10 MG tablet Take 1 tablet by mouth once daily   Omega-3  Fatty Acids (FISH OIL PO) Take by mouth.   senna (SENOKOT) 8.6 MG TABS tablet Take 1 tablet by mouth.   Sulfacetamide Sodium (SODIUM SULFACETAMIDE) 10 % SHAM Apply topically.   tizanidine (ZANAFLEX) 2 MG capsule Take one tab po qhs for spasm   traZODone (DESYREL) 50 MG tablet Take 0.5-1 tablets (25-50 mg total) by mouth at bedtime as needed for sleep.   TRUEplus Lancets 33G MISC TEST BLOOD SUGAR IN THE MORNING AND AT BEDTIME   Vitamin D, Ergocalciferol, (DRISDOL) 50000 units CAPS capsule    No  facility-administered encounter medications on file as of 11/24/2020.   Patients initial visit was rescheduled and I wanted to update the clinical pharmacist Alena Bills on the patients recent office visits since 10/27/20.  Follow-Up:Pharmacist Review  Charlann Lange, [place] Pharmacist Assistant [phone]

## 2020-11-24 NOTE — Progress Notes (Signed)
ERROR

## 2020-11-26 ENCOUNTER — Ambulatory Visit: Payer: HMO

## 2020-12-01 ENCOUNTER — Ambulatory Visit: Payer: HMO | Admitting: Nurse Practitioner

## 2020-12-02 DIAGNOSIS — H401113 Primary open-angle glaucoma, right eye, severe stage: Secondary | ICD-10-CM | POA: Diagnosis not present

## 2020-12-03 ENCOUNTER — Inpatient Hospital Stay: Payer: HMO | Attending: Oncology

## 2020-12-03 ENCOUNTER — Other Ambulatory Visit: Payer: Self-pay

## 2020-12-03 DIAGNOSIS — E785 Hyperlipidemia, unspecified: Secondary | ICD-10-CM | POA: Diagnosis not present

## 2020-12-03 DIAGNOSIS — E1165 Type 2 diabetes mellitus with hyperglycemia: Secondary | ICD-10-CM | POA: Diagnosis not present

## 2020-12-03 DIAGNOSIS — N183 Chronic kidney disease, stage 3 unspecified: Secondary | ICD-10-CM | POA: Insufficient documentation

## 2020-12-03 DIAGNOSIS — R0609 Other forms of dyspnea: Secondary | ICD-10-CM | POA: Diagnosis not present

## 2020-12-03 DIAGNOSIS — D631 Anemia in chronic kidney disease: Secondary | ICD-10-CM | POA: Diagnosis not present

## 2020-12-03 DIAGNOSIS — D509 Iron deficiency anemia, unspecified: Secondary | ICD-10-CM | POA: Diagnosis not present

## 2020-12-03 DIAGNOSIS — E559 Vitamin D deficiency, unspecified: Secondary | ICD-10-CM | POA: Diagnosis not present

## 2020-12-03 DIAGNOSIS — D638 Anemia in other chronic diseases classified elsewhere: Secondary | ICD-10-CM | POA: Diagnosis not present

## 2020-12-03 DIAGNOSIS — N1832 Chronic kidney disease, stage 3b: Secondary | ICD-10-CM

## 2020-12-03 DIAGNOSIS — D351 Benign neoplasm of parathyroid gland: Secondary | ICD-10-CM | POA: Diagnosis not present

## 2020-12-03 DIAGNOSIS — M80022G Age-related osteoporosis with current pathological fracture, left humerus, subsequent encounter for fracture with delayed healing: Secondary | ICD-10-CM | POA: Diagnosis not present

## 2020-12-03 DIAGNOSIS — N189 Chronic kidney disease, unspecified: Secondary | ICD-10-CM | POA: Diagnosis not present

## 2020-12-03 LAB — CBC WITH DIFFERENTIAL/PLATELET
Abs Immature Granulocytes: 0.03 10*3/uL (ref 0.00–0.07)
Basophils Absolute: 0 10*3/uL (ref 0.0–0.1)
Basophils Relative: 0 %
Eosinophils Absolute: 0.2 10*3/uL (ref 0.0–0.5)
Eosinophils Relative: 3 %
HCT: 35.2 % — ABNORMAL LOW (ref 36.0–46.0)
Hemoglobin: 11.1 g/dL — ABNORMAL LOW (ref 12.0–15.0)
Immature Granulocytes: 1 %
Lymphocytes Relative: 20 %
Lymphs Abs: 1.1 10*3/uL (ref 0.7–4.0)
MCH: 27.3 pg (ref 26.0–34.0)
MCHC: 31.5 g/dL (ref 30.0–36.0)
MCV: 86.5 fL (ref 80.0–100.0)
Monocytes Absolute: 0.6 10*3/uL (ref 0.1–1.0)
Monocytes Relative: 10 %
Neutro Abs: 3.8 10*3/uL (ref 1.7–7.7)
Neutrophils Relative %: 66 %
Platelets: 151 10*3/uL (ref 150–400)
RBC: 4.07 MIL/uL (ref 3.87–5.11)
RDW: 13.9 % (ref 11.5–15.5)
WBC: 5.7 10*3/uL (ref 4.0–10.5)
nRBC: 0 % (ref 0.0–0.2)

## 2020-12-03 LAB — COMPREHENSIVE METABOLIC PANEL
ALT: 12 U/L (ref 0–44)
AST: 14 U/L — ABNORMAL LOW (ref 15–41)
Albumin: 3.8 g/dL (ref 3.5–5.0)
Alkaline Phosphatase: 54 U/L (ref 38–126)
Anion gap: 9 (ref 5–15)
BUN: 23 mg/dL (ref 8–23)
CO2: 27 mmol/L (ref 22–32)
Calcium: 9.2 mg/dL (ref 8.9–10.3)
Chloride: 104 mmol/L (ref 98–111)
Creatinine, Ser: 1.61 mg/dL — ABNORMAL HIGH (ref 0.44–1.00)
GFR, Estimated: 33 mL/min — ABNORMAL LOW (ref >60)
Glucose, Bld: 141 mg/dL — ABNORMAL HIGH (ref 70–99)
Potassium: 3.8 mmol/L (ref 3.5–5.1)
Sodium: 140 mmol/L (ref 135–145)
Total Bilirubin: 0.5 mg/dL (ref 0.3–1.2)
Total Protein: 6.5 g/dL (ref 6.5–8.1)

## 2020-12-03 LAB — VITAMIN B12: Vitamin B-12: 616 pg/mL (ref 180–914)

## 2020-12-03 NOTE — Progress Notes (Signed)
Chronic Care Management Pharmacy Note  12/04/2020 Name:  Frances Greene MRN:  [id number] DOB:  January 01, 1946  Summary: Pt reports taking multiple medications differently than prescribed, will consult PCP to confirm and get updated scripts sent in if need be. Pt needs updated lipid panel.  Recommendations/Changes made from today's visit: Check blood pressure at least once weekly and keep a log Continue medication therapy  Plan: F/U in 3 months   Subjective: Frances Greene is an 75 y.o. year old female who is a primary patient of Abernathy, Yetta Flock, NP.  The CCM team was consulted for assistance with disease management and care coordination needs.    Engaged with patient face to face for initial visit in response to provider referral for pharmacy case management and/or care coordination services.   Consent to Services:  The patient was given the following information about Chronic Care Management services today, agreed to services, and gave verbal consent: 1. CCM service includes personalized support from designated clinical staff supervised by the primary care provider, including individualized plan of care and coordination with other care providers 2. 24/7 contact phone numbers for assistance for urgent and routine care needs. 3. Service will only be billed when office clinical staff spend 20 minutes or more in a month to coordinate care. 4. Only one practitioner may furnish and bill the service in a calendar month. 5.The patient may stop CCM services at any time (effective at the end of the month) by phone call to the office staff. 6. The patient will be responsible for cost sharing (co-pay) of up to 20% of the service fee (after annual deductible is met). Patient agreed to services and consent obtained.  Patient Care Team: Jonetta Osgood, NP as PCP - General (Nurse Practitioner) Alena Bills, [facility] as Pharmacist (Pharmacist)  Recent office visits: 11/13/20 Jonetta Osgood, NP. For acute visit. STARTED Trazodone 25-50 mg oral at bedtime PRN CHANGED Bisoprolol-Hydrochlorothiazide to 5-6.25 daily STOPPED Cyanocobalamin, Diphenhydramine, Linaclotide, MIRALAX.   Recent consult visits: 11/11/20 [facility] (9 Hours) For Op Visit. Billey Co, MD. 11/11/20 Urology Billey Co, MD. Per note:samples of Myrbetriq 50 mg x 4 weeks given 10/30/20 Nephrology Anthonette Legato, MD For follow-up. No medication changes.  Hospital visits: None in previous 6 months   Objective:  Lab Results  Component Value Date   CREATININE 1.61 (H) 12/03/2020   BUN 23 12/03/2020   GFRNONAA 33 (L) 12/03/2020   GFRAA 30 (L) 08/24/2019   NA 140 12/03/2020   K 3.8 12/03/2020   CALCIUM 9.2 12/03/2020   CO2 27 12/03/2020   GLUCOSE 141 (H) 12/03/2020    Lab Results  Component Value Date/Time   HGBA1C 5.8 (A) 09/04/2020 09:18 AM   HGBA1C 6.0 (A) 04/02/2020 09:26 AM   MICROALBUR 30-300 01/01/2020 02:39 PM    Last diabetic Eye exam: No results found for: HMDIABEYEEXA  Last diabetic Foot exam: No results found for: HMDIABFOOTEX   Lab Results  Component Value Date   CHOL 129 08/24/2019   HDL 54 08/24/2019   LDLCALC 59 08/24/2019   TRIG 84 08/24/2019    Hepatic Function Latest Ref Rng & Units 12/03/2020 03/26/2020 08/24/2019  Total Protein 6.5 - 8.1 g/dL 6.5 6.3 6.2  Albumin 3.5 - 5.0 g/dL 3.8 4.4 4.1  AST 15 - 41 U/L 14(L) 13 12  ALT 0 - 44 U/L $Remo'12 8 8  'aJrcY$ Alk Phosphatase 38 - 126 U/L 54 72 66  Total Bilirubin 0.3 - 1.2 mg/dL  0.5 0.4 0.5    Lab Results  Component Value Date/Time   TSH 3.436 03/12/2020 03:59 PM   TSH 3.480 08/24/2019 03:02 PM   TSH 3.900 07/24/2018 10:39 AM   FREET4 1.20 08/24/2019 03:02 PM   FREET4 1.07 07/24/2018 10:39 AM    CBC Latest Ref Rng & Units 12/03/2020 12/03/2020 07/30/2020  WBC 3.4 - 10.8 x10E3/uL 6.3 5.7 6.2  Hemoglobin 11.1 - 15.9 g/dL 11.0(L) 11.1(L) 10.6(L)  Hematocrit 34.0 - 46.6 % 33.8(L) 35.2(L) 32.7(L)   Platelets 150 - 450 x10E3/uL 146(L) 151 151    No results found for: VD25OH  Clinical ASCVD: No  The ASCVD Risk score (Arnett DK, et al., 2019) failed to calculate for the following reasons:   The valid total cholesterol range is 130 to 320 mg/dL    Depression screen [facility] 2/9 09/04/2020 09/04/2020 04/01/2020  Decreased Interest 0 0 0  Down, Depressed, Hopeless 0 0 0  PHQ - 2 Score 0 0 0     Social History   Tobacco Use  Smoking Status Former   Types: Cigarettes  Smokeless Tobacco Never  Tobacco Comments   4 cigarettes quit 20 years ago   BP Readings from Last 3 Encounters:  11/13/20 (!) 163/64  11/11/20 (!) 160/79  09/04/20 135/70   Pulse Readings from Last 3 Encounters:  11/13/20 72  11/11/20 76  09/04/20 70   Wt Readings from Last 3 Encounters:  11/13/20 225 lb 12.8 oz (102.4 kg)  11/11/20 227 lb (103 kg)  09/04/20 226 lb 6.4 oz (102.7 kg)   BMI Readings from Last 3 Encounters:  11/13/20 40.00 kg/m  11/11/20 40.21 kg/m  09/04/20 41.41 kg/m    Assessment/Interventions: Review of patient past medical history, allergies, medications, health status, including review of consultants reports, laboratory and other test data, was performed as part of comprehensive evaluation and provision of chronic care management services.   SDOH:  (Social Determinants of Health) assessments and interventions performed: Yes  SDOH Screenings   Alcohol Screen: Low Risk    Last Alcohol Screening Score (AUDIT): 0  Depression (PHQ2-9): Low Risk    PHQ-2 Score: 0  Financial Resource Strain: Low Risk    Difficulty of Paying Living Expenses: Not very hard  Food Insecurity: Not on file  Housing: Not on file  Physical Activity: Not on file  Social Connections: Not on file  Stress: Not on file  Tobacco Use: Medium Risk   Smoking Tobacco Use: Former   Smokeless Tobacco Use: Never   Passive Exposure: Not on file  Transportation Needs: Not on file    CCM Care Plan  Allergies   Allergen Reactions   Ramipril Cough and Other (See Comments)    Other reaction(s): Cough Other reaction(s): Cough Other reaction(s): Other (See Comments) Other reaction(s): Cough Other reaction(s): Cough Other reaction(s): Cough     Medications Reviewed Today     Reviewed by Alena Bills, [facility] (Pharmacist) on 12/04/20 at 1604  Med List Status: <None>   Medication Order Taking? Sig Documenting Provider Last Dose Status Informant  acetaminophen (TYLENOL) 325 MG tablet [id number]  Take 650 mg by mouth every 6 (six) hours as needed. [provider]  Active            Med Note Despina Hidden   Tue Nov 11, 2020  2:37 PM)    allopurinol (ZYLOPRIM) 300 MG tablet [id number]  Take 1 tablet (300 mg total) by mouth daily. Lavera Guise, MD  Active  Med Note Annabell Howells Dec 04, 2020  4:03 PM) Patient reports taking 1/2 tab daily  AMBULATORY NON FORMULARY MEDICATION [id number]  Medication Name: geni kot natural veggie laxitive [provider]  Active   aspirin EC 325 MG tablet [id number]  Take 325 mg by mouth. [provider]  Active            Med Note Despina Hidden   Tue Nov 11, 2020  2:37 PM)    atorvastatin (LIPITOR) 10 MG tablet [id number]  Take 1 tablet (10 mg total) by mouth daily. Lavera Guise, MD  Active   betamethasone dipropionate 0.05 % cream [id number]  Apply topically 2 (two) times daily. Jonetta Osgood, NP  Active   bisoprolol-hydrochlorothiazide [facility]) 5-6.25 MG tablet [id number]  Take 1 tablet by mouth daily. Jonetta Osgood, NP  Active   Blood Glucose Calibration (TRUE METRIX LEVEL 1) Low SOLN [id number]  Use as directed Kendell Bane, NP  Active   cinacalcet (SENSIPAR) 30 MG tablet [id number]   [provider]  Active   citalopram (CELEXA) 20 MG tablet [id number]  TAKE 1 TABLET BY MOUTH ONCE DAILY FOR DEPRESSION Jonetta Osgood, NP  Active            Med Note Alena Bills   Thu Dec 04, 2020  4:03  PM) Pt states taking 1/2 tab daily  fluticasone-salmeterol (ADVAIR) 100-50 MCG/ACT AEPB [id number]  Inhale 1 puff into the lungs 2 (two) times daily. Jonetta Osgood, NP  Active   furosemide (LASIX) 40 MG tablet [id number]  TAKE 1 TABLET BY MOUTH ONCE DAILY AND  TAKE  XTRA  TAB  IF  NEEDED Kendell Bane, NP  Active            Med Note Alena Bills   Thu Dec 04, 2020  4:03 PM) Patient reports taking only as needed for fluid  Garlic 7035 MG CAPS [id number]  Take by mouth. [provider]  Active            Med Note Despina Hidden   Tue Nov 11, 2020  2:37 PM)    glimepiride (AMARYL) 1 MG tablet [id number]  Take 1 tablet (1 mg total) by mouth daily. Kendell Bane, NP  Active            Med Note Alena Bills   Thu Dec 04, 2020  4:04 PM) Patient reports taking as needed for blood sugars 140 or higher  glucose blood (TRUE METRIX BLOOD GLUCOSE TEST) test strip [id number]  1 each by Other route in the morning and at bedtime. Dx E11.65 Lavera Guise, MD  Active   hydrALAZINE (APRESOLINE) 25 MG tablet [id number]  TAKE 1 TABLET BY MOUTH THREE TIMES DAILY FOR BLOOD PRESSURE Lavera Guise, MD  Active   HYDROcodone-acetaminophen (NORCO/VICODIN) 5-325 MG tablet [id number] No Take 1 tablet by mouth every 6 (six) hours as needed for moderate pain or severe pain.  Patient not taking: Reported on 12/04/2020   Delman Kitten, MD Not Taking Active   Insulin Pen Needle (PEN NEEDLES) 32G X 4 MM MISC [id number]  1 Units by Does not apply route daily. Use with victoza injections daily. Ronnell Freshwater, NP  Active   ketoconazole (NIZORAL) 2 % shampoo [id number]  SHAMPOO AS DIRECTED Lavera Guise, MD  Active   latanoprost (XALATAN) 0.005 % ophthalmic solution [id number]  latanoprost 0.005 % eye drops  INSTILL 1 DROP INTO EACH EYE IN THE EVENING  [provider]  Active   liraglutide (VICTOZA) 18 MG/3ML SOPN [id number]  INJECT 1.8 MG ONCE DAILY SUBCUTANEOUSLY Lavera Guise, MD  Active    losartan (COZAAR) 25 MG tablet [id number]  Take 25 mg by mouth daily. Patient takes 1/2 tab daily [provider]  Active   meloxicam (MOBIC) 7.5 MG tablet [id number]  TAKE 1 TABLET BY MOUTH ONCE DAILY FOR  GOUT  AND  ARTHRITIS Lavera Guise, MD  Active   Mirabegron (MYRBETRIQ PO) [id number]  Take by mouth. [provider]  Active   montelukast (SINGULAIR) 10 MG tablet [id number]  Take 1 tablet by mouth once daily Abernathy, Alyssa, NP  Active   Omega-3 Fatty Acids (FISH OIL PO) [id number]  Take by mouth. [provider]  Active            Med Note Despina Hidden   Tue Nov 11, 2020  2:37 PM)    senna (SENOKOT) 8.6 MG TABS tablet [id number]  Take 1 tablet by mouth. [provider]  Active   Sulfacetamide Sodium (SODIUM SULFACETAMIDE) 10 % SHAM [id number]  Apply topically. [provider]  Active   tizanidine (ZANAFLEX) 2 MG capsule [id number]  Take one tab po qhs for spasm Lavera Guise, MD  Active   traZODone (DESYREL) 50 MG tablet [id number]  Take 0.5-1 tablets (25-50 mg total) by mouth at bedtime as needed for sleep. Jonetta Osgood, NP  Active   TRUEplus Lancets 33G MISC [id number]  TEST BLOOD SUGAR IN THE MORNING AND AT BEDTIME Lavera Guise, MD  Active   Vitamin D, Ergocalciferol, (DRISDOL) 50000 units CAPS capsule [id number]   [provider]  Active            Med Note Despina Hidden   Tue Nov 11, 2020  2:38 PM)              Patient Active Problem List   Diagnosis Date Noted   B12 deficiency 03/19/2020   Anemia in stage 3b chronic kidney disease ([place]) 03/19/2020   Acute bilateral low back pain with right-sided sciatica 11/12/2018   Closed fracture of right proximal humerus 11/12/2018   Type 2 diabetes mellitus with hyperglycemia ([place]) 10/28/2018   Flu vaccine need 10/28/2018   Pain due to onychomycosis of toenails of both feet 07/17/2018   Type 2 diabetes mellitus with vascular disease ([place]) 07/17/2018   Other fatigue  11/14/2017   Moderate episode of recurrent major depressive disorder ([place]) 11/14/2017   Chronic allergic rhinitis 11/14/2017   Primary insomnia 11/14/2017   Uncontrolled type 2 diabetes mellitus with hyperglycemia ([place]) 09/18/2017   Acute pain of right shoulder 09/18/2017   Type 2 diabetes mellitus with chronic kidney disease ([place]) 01/19/2017   Anosmia 01/19/2017   Cervicalgia 01/19/2017   Vitamin D deficiency 01/19/2017   Hypoglycemia 01/19/2017   OSA (obstructive sleep apnea) 01/19/2017   Essential hypertension, benign 01/19/2017   Mixed hyperlipidemia 01/19/2017   Anemia, unspecified 01/19/2017   Edema 01/19/2017   SOB (shortness of breath) 01/19/2017   Gout 01/19/2017   Morbid obesity ([place]) 01/19/2017   Stage 3 chronic kidney disease ([place]) 01/19/2017   Hypercalcemia 01/19/2017   Iron deficiency anemia 01/14/2016   Acute pain of right knee 04/23/2015   Left wrist pain 08/12/2014   Status post bilateral unicompartmental knee replacement 11/13/2013   Arthritis, senescent 08/02/2013    Immunization History  Administered Date(s) Administered   Influenza Inj Mdck Quad Pf 10/17/2018, 10/09/2019  Influenza, High Dose Seasonal PF 12/08/2017   Influenza-Unspecified 10/12/2016, 10/04/2020   PFIZER(Purple Top)SARS-COV-2 Vaccination 04/20/2019, 05/18/2019, 11/14/2019   PNEUMOCOCCAL CONJUGATE-20 10/04/2020   Pneumococcal Conjugate-13 03/10/2018   Pneumococcal-Unspecified 11/27/2015   Tetanus 10/04/2020   Zoster Recombinat (Shingrix) 10/04/2020    Conditions to be addressed/monitored:  Hypertension, Hyperlipidemia, Diabetes, Asthma, Chronic Kidney Disease, Depression, Osteoarthritis, Gout, and Vit D Deficiency  Care Plan : [place]  Updates made by Alena Bills, [place] since 12/04/2020 12:00 AM     Problem: HLD, HTN, T2DM, CKD, Vit D Deficiency, Asthma/Allergies   Priority: High     Long-Range Goal: Disease Management   Start Date: 12/04/2020  Expected End  Date: 12/04/2021  This Visit's Progress: On track  Priority: High  Note:   Current Barriers:  Reports taking medications differently than instructions on bottle. May cause confusion  Pharmacist Clinical Goal(s):  Patient will verbalize ability to afford treatment regimen contact provider office for questions/concerns as evidenced notation of same in electronic health record through collaboration with PharmD and provider.   Interventions: 1:1 collaboration with Jonetta Osgood, NP regarding development and update of comprehensive plan of care as evidenced by provider attestation and co-signature Inter-disciplinary care team collaboration (see longitudinal plan of care) Comprehensive medication review performed; medication list updated in electronic medical record  Hypertension (BP goal <130/80) -Uncontrolled -Current treatment: Bisoprolol-HCTZ 5-6.25mg  daily Lasix 40mg  daily (may take additional tab if needed)*patient reports taking only as needed Hydralazine 25mg  three times daily Losartan 25mg  1/2 tab daily -Medications previously tried: None noted  -Current home readings: Does not read at home, no home monitor -Current dietary habits: two cups of coffee in morning, then snacks throughout the day on chicken, fish, green beans, spinach, collards. Sweet potatoes for dessert. Loves water -Current exercise habits: no specific routine, reports staying active by cleaning house -Denies hypotensive/hypertensive symptoms -Educated on BP goals and benefits of medications for prevention of heart attack, stroke and kidney damage; Importance of home blood pressure monitoring; -Counseled to monitor BP at home once weekly, document, and provide log at future appointments -Counseled on diet and exercise extensively Recommended to continue current medication therapy, will consult with provider about getting scripts that reflect how patient is taking meds  Hyperlipidemia: (LDL goal <  70) -Uncontrolled -Current treatment: Atorvastatin 10mg  daily Fish Oil -Medications previously tried: None noted  -Current dietary patterns: see above -Current exercise habits: see above -Educated on Cholesterol goals;  -Need updated lipid panel to determine control -Recommended to continue current medication  Diabetes (A1c goal <7%) -Controlled -Current medications: Glimeperide 1mg  daily (reports only taking as needed for blood sugars over 140) Victoza 1.8mg  daily -Medications previously tried: None noted  -Current home glucose readings fasting glucose: does not take post prandial glucose: in evening before bed, 87-123 -Denies hypoglycemic/hyperglycemic symptoms -Current meal patterns: see above -Current exercise: see above -Educated on A1c and blood sugar goals; Prevention and management of hypoglycemic episodes; Benefits of routine self-monitoring of blood sugar; -Counseled to check feet daily and get yearly eye exams -Recommended to continue current medication  Asthma (Goal: control symptoms and prevent exacerbations) -Controlled -Current treatment  Advair 100-50 1 puff twice daily Montelukast 10mg  daily Benadryl as needed -Medications previously tried: none noted  -Patient denies consistent use of maintenance inhaler -Uses maintenance inhaler for seasonal flares, then uses regularly -Frequency of rescue inhaler use: N/A -Counseled on Proper inhaler technique; Benefits of consistent maintenance inhaler use -Recommended to continue current medication; may consider switching benadryl to zyrtec or  claritin to avoid drowsiness  Depression/Anxiety (Goal: well controlled mood) -Controlled -Current treatment: Citalopram 20mg  (patient reports 1/2 tab daily) -Medications previously tried/failed: none noted -Educated on Benefits of medication for symptom control -Recommended to continue current medication  Gout (Goal: no gout flares) -Controlled -Current treatment   Allopurinol 300mg  (patient reports 1/2 tab daily) Meloxicam 7.5mg  daily as needed for flare -Medications previously tried: none noted  -Recommended to continue current medication  Vit D (Goal: WNL) -Controlled -Current treatment  Vit D 50,000 units weekly -Medications previously tried: none noted  -Recommended to continue current medication  CKD (Goal: Maintain or improve kidney function) -Not ideally controlled -Current treatment  Losartan 25mg  (1/2 tab daily) Sensipar daily -Medications previously tried: none noted  -Recommended to continue current medication  Chronic Pain/Arthritis (Goal: no pain limitations) -Controlled -Current treatment  Acetaminophen as needed -Medications previously tried: none noted  -Recommended to continue current medication  Patient Goals/Self-Care Activities Patient will:  - take medications as prescribed as evidenced by patient report and record review check blood pressure weekly, document, and provide at future appointments  Follow Up Plan: Telephone follow up appointment with care management team member scheduled for: 3 months      Medication Assistance: None required.  Patient affirms current coverage meets needs.  Compliance/Adherence/Medication fill history: Care Gaps: Shingles Vaccine  Star-Rating Drugs: Atorvastatin 10 mg 11/05/20 90 DS Glimepiride 1 mg 10/06/19 90 DS  Losartan 25 mg 10/07/20 90 DS  Patient's preferred pharmacy is:  [facility] [address], [place] - [place] [address] Phone: [phone] Fax: [phone]  [place], [place] [address] Phone: [phone] Fax: [phone]  Uses pill box? No - Prefers to keep out Pt endorses 100% compliance  We discussed: Benefits of medication synchronization, packaging and delivery as well as enhanced pharmacist oversight with  [employer]. Patient decided to: Continue current medication management strategy  Care Plan and Follow Up Patient Decision:  Patient agrees to Care Plan and Follow-up.  Plan: Telephone follow up appointment with care management team member scheduled for:  3 months  [place] Pharmacist [phone]

## 2020-12-04 ENCOUNTER — Ambulatory Visit: Payer: HMO | Admitting: Student-PharmD

## 2020-12-04 ENCOUNTER — Ambulatory Visit: Payer: HMO | Admitting: Nurse Practitioner

## 2020-12-04 DIAGNOSIS — E782 Mixed hyperlipidemia: Secondary | ICD-10-CM

## 2020-12-04 DIAGNOSIS — N183 Chronic kidney disease, stage 3 unspecified: Secondary | ICD-10-CM

## 2020-12-04 DIAGNOSIS — I10 Essential (primary) hypertension: Secondary | ICD-10-CM

## 2020-12-04 DIAGNOSIS — E559 Vitamin D deficiency, unspecified: Secondary | ICD-10-CM

## 2020-12-04 DIAGNOSIS — N1832 Chronic kidney disease, stage 3b: Secondary | ICD-10-CM

## 2020-12-04 DIAGNOSIS — J309 Allergic rhinitis, unspecified: Secondary | ICD-10-CM

## 2020-12-04 LAB — CBC WITH DIFFERENTIAL/PLATELET
Basophils Absolute: 0 10*3/uL (ref 0.0–0.2)
Basos: 0 %
EOS (ABSOLUTE): 0.2 10*3/uL (ref 0.0–0.4)
Eos: 3 %
Hematocrit: 33.8 % — ABNORMAL LOW (ref 34.0–46.6)
Hemoglobin: 11 g/dL — ABNORMAL LOW (ref 11.1–15.9)
Immature Grans (Abs): 0 10*3/uL (ref 0.0–0.1)
Immature Granulocytes: 1 %
Lymphocytes Absolute: 1.2 10*3/uL (ref 0.7–3.1)
Lymphs: 19 %
MCH: 27.4 pg (ref 26.6–33.0)
MCHC: 32.5 g/dL (ref 31.5–35.7)
MCV: 84 fL (ref 79–97)
Monocytes Absolute: 0.6 10*3/uL (ref 0.1–0.9)
Monocytes: 9 %
Neutrophils Absolute: 4.3 10*3/uL (ref 1.4–7.0)
Neutrophils: 68 %
Platelets: 146 10*3/uL — ABNORMAL LOW (ref 150–450)
RBC: 4.02 x10E6/uL (ref 3.77–5.28)
RDW: 13.3 % (ref 11.7–15.4)
WBC: 6.3 10*3/uL (ref 3.4–10.8)

## 2020-12-04 LAB — IRON AND TIBC
Iron: 44 ug/dL (ref 28–170)
Saturation Ratios: 14 % (ref 10.4–31.8)
TIBC: 305 ug/dL (ref 250–450)
UIBC: 261 ug/dL

## 2020-12-04 LAB — SEDIMENTATION RATE: Sed Rate: 28 mm/hr (ref 0–40)

## 2020-12-04 LAB — PRO B NATRIURETIC PEPTIDE: NT-Pro BNP: 1161 pg/mL — ABNORMAL HIGH (ref 0–738)

## 2020-12-04 LAB — C-REACTIVE PROTEIN: CRP: 5 mg/L (ref 0–10)

## 2020-12-04 LAB — FERRITIN: Ferritin: 38 ng/mL (ref 11–307)

## 2020-12-04 NOTE — Patient Instructions (Addendum)
Visit Information   Goals Addressed             This Visit's Progress    Manage My Medicine   On track    Timeframe:  Long-Range Goal Priority:  High Start Date:     12/04/2020                        Expected End Date:  12/04/2021                     Follow Up Date 02/2021    - call for medicine refill 2 or 3 days before it runs out - keep a list of all the medicines I take; vitamins and herbals too - use a pillbox to sort medicine    Why is this important?   These steps will help you keep on track with your medicines.   Notes:      Track and Manage My Blood Pressure-Hypertension   On track    Timeframe:  Long-Range Goal Priority:  High Start Date:  12/04/2020                           Expected End Date:    12/04/2021                   Follow Up Date 02/2021    - check blood pressure weekly - choose a place to take my blood pressure (home, clinic or office, retail store) - write blood pressure results in a log or diary    Why is this important?   You won't feel high blood pressure, but it can still hurt your blood vessels.  High blood pressure can cause heart or kidney problems. It can also cause a stroke.  Making lifestyle changes like losing a little weight or eating less salt will help.  Checking your blood pressure at home and at different times of the day can help to control blood pressure.  If the doctor prescribes medicine remember to take it the way the doctor ordered.  Call the office if you cannot afford the medicine or if there are questions about it.     Notes:        Patient Care Plan: CCM Pharmacy Care Plan     Problem Identified: HLD, HTN, T2DM, CKD, Vit D Deficiency, Asthma/Allergies   Priority: High     Long-Range Goal: Disease Management   Start Date: 12/04/2020  Expected End Date: 12/04/2021  This Visit's Progress: On track  Priority: High  Note:   Current Barriers:  Reports taking medications differently than instructions on bottle.  May cause confusion  Pharmacist Clinical Goal(s):  Patient will verbalize ability to afford treatment regimen contact provider office for questions/concerns as evidenced notation of same in electronic health record through collaboration with PharmD and provider.   Interventions: 1:1 collaboration with Jonetta Osgood, NP regarding development and update of comprehensive plan of care as evidenced by provider attestation and co-signature Inter-disciplinary care team collaboration (see longitudinal plan of care) Comprehensive medication review performed; medication list updated in electronic medical record  Hypertension (BP goal <130/80) -Uncontrolled -Current treatment: Bisoprolol-HCTZ 5-6.25mg  daily Lasix 40mg  daily (may take additional tab if needed)*patient reports taking only as needed Hydralazine 25mg  three times daily Losartan 25mg  1/2 tab daily -Medications previously tried: None noted  -Current home readings: Does not read at home, no home monitor -Current dietary habits: two  cups of coffee in morning, then snacks throughout the day on chicken, fish, green beans, spinach, collards. Sweet potatoes for dessert. Loves water -Current exercise habits: no specific routine, reports staying active by cleaning house -Denies hypotensive/hypertensive symptoms -Educated on BP goals and benefits of medications for prevention of heart attack, stroke and kidney damage; Importance of home blood pressure monitoring; -Counseled to monitor BP at home once weekly, document, and provide log at future appointments -Counseled on diet and exercise extensively Recommended to continue current medication therapy, will consult with provider about getting scripts that reflect how patient is taking meds  Hyperlipidemia: (LDL goal < 70) -Uncontrolled -Current treatment: Atorvastatin 10mg  daily Fish Oil -Medications previously tried: None noted  -Current dietary patterns: see above -Current exercise  habits: see above -Educated on Cholesterol goals;  -Need updated lipid panel to determine control -Recommended to continue current medication  Diabetes (A1c goal <7%) -Controlled -Current medications: Glimeperide 1mg  daily (reports only taking as needed for blood sugars over 140) Victoza 1.8mg  daily -Medications previously tried: None noted  -Current home glucose readings fasting glucose: does not take post prandial glucose: in evening before bed, 87-123 -Denies hypoglycemic/hyperglycemic symptoms -Current meal patterns: see above -Current exercise: see above -Educated on A1c and blood sugar goals; Prevention and management of hypoglycemic episodes; Benefits of routine self-monitoring of blood sugar; -Counseled to check feet daily and get yearly eye exams -Recommended to continue current medication  Asthma (Goal: control symptoms and prevent exacerbations) -Controlled -Current treatment  Advair 100-50 1 puff twice daily Montelukast 10mg  daily Benadryl as needed -Medications previously tried: none noted  -Patient denies consistent use of maintenance inhaler -Uses maintenance inhaler for seasonal flares, then uses regularly -Frequency of rescue inhaler use: N/A -Counseled on Proper inhaler technique; Benefits of consistent maintenance inhaler use -Recommended to continue current medication; may consider switching benadryl to zyrtec or claritin to avoid drowsiness  Depression/Anxiety (Goal: well controlled mood) -Controlled -Current treatment: Citalopram 20mg  (patient reports 1/2 tab daily) -Medications previously tried/failed: none noted -Educated on Benefits of medication for symptom control -Recommended to continue current medication  Gout (Goal: no gout flares) -Controlled -Current treatment  Allopurinol 300mg  (patient reports 1/2 tab daily) Meloxicam 7.5mg  daily as needed for flare -Medications previously tried: none noted  -Recommended to continue current  medication  Vit D (Goal: WNL) -Controlled -Current treatment  Vit D 50,000 units weekly -Medications previously tried: none noted  -Recommended to continue current medication  CKD (Goal: Maintain or improve kidney function) -Not ideally controlled -Current treatment  Losartan 25mg  (1/2 tab daily) Sensipar daily -Medications previously tried: none noted  -Recommended to continue current medication  Chronic Pain/Arthritis (Goal: no pain limitations) -Controlled -Current treatment  Acetaminophen as needed -Medications previously tried: none noted  -Recommended to continue current medication  Patient Goals/Self-Care Activities Patient will:  - take medications as prescribed as evidenced by patient report and record review check blood pressure weekly, document, and provide at future appointments  Follow Up Plan: Telephone follow up appointment with care management team member scheduled for: 3 months     Ms. Domenico was given information about Chronic Care Management services today including:  CCM service includes personalized support from designated clinical staff supervised by her physician, including individualized plan of care and coordination with other care providers 24/7 contact phone numbers for assistance for urgent and routine care needs. Standard insurance, coinsurance, copays and deductibles apply for chronic care management only during months in which we provide at least 20 minutes of these services.  Most insurances cover these services at 100%, however patients may be responsible for any copay, coinsurance and/or deductible if applicable. This service may help you avoid the need for more expensive face-to-face services. Only one practitioner may furnish and bill the service in a calendar month. The patient may stop CCM services at any time (effective at the end of the month) by phone call to the office staff.  Patient agreed to services and verbal consent obtained.    The patient verbalized understanding of instructions, educational materials, and care plan provided today and agreed to receive a mailed copy of patient instructions, educational materials, and care plan.  Telephone follow up appointment with pharmacy team member scheduled for: 3 months  Alena Bills, [facility]  Clinical Pharmacist [phone]

## 2020-12-08 ENCOUNTER — Ambulatory Visit: Payer: HMO | Admitting: Physician Assistant

## 2020-12-08 NOTE — Progress Notes (Signed)
12/09/2020 3:42 PM   Frances Greene 12-15-1945 [id number]  Referring provider: Jonetta Osgood, NP [place],  [place] [address]  Chief Complaint  Patient presents with   Over Active Bladder   Urological history 1. OAB -contributing factors of age, obesity, diabetes, vaginal atrophy and diuretics -PVR 0 mL  2. Incontinence  -contributing factors of age, obesity, diabetes, vaginal atrophy, diuretics, depression, CHF and sleep apnea   HPI: Frances Greene is a 75 y.o. female who presents today after a trial of Myrbetriq 50 mg daily.  PVR 0 mL  She indicates that she has 8 or more daytime urinations, 3 or more nighttime urinations and a mild urge to urinate.  She does suffer with urge incontinence.  She leaks urine 3 or more times daily.  She always wears a pad for the urinary leakage.  She goes through 2 absorbent pads daily and engages in toilet mapping.  At her last visit she was given Myrbetriq 50 mg samples but she states she has noticed a significant improvement in her urinary  symptoms.  She states she is noticed a reduced amount of daytime urinations and her nighttime urinations have decreased from going every 30 minutes to 3-4 times nightly.  She is satisfied with these results.  Patient denies any modifying or aggravating factors.  Patient denies any gross hematuria, dysuria or suprapubic/flank pain.  Patient denies any fevers, chills, nausea or vomiting.    PMH: Past Medical History:  Diagnosis Date   Arthritis    Asthma    Bronchitis    CHF (congestive heart failure) (HCC)    Chronic kidney disease    decreased kidney function   Diabetes mellitus without complication (HCC)    Gout    Hypertension    Multiple allergies    Shortness of breath dyspnea    w/ exertion   Sleep apnea    CPAP   Swelling of both lower extremities    Wheezing     Surgical History: Past Surgical History:  Procedure Laterality Date   ABDOMINAL HYSTERECTOMY     BREAST BIOPSY Left 1989   neg   CATARACT EXTRACTION W/PHACO Right 05/21/2015   Procedure: CATARACT EXTRACTION PHACO AND INTRAOCULAR LENS PLACEMENT ([place]);  Surgeon: Milus Height, MD;  Location: [facility] ORS;  Service: Ophthalmology;  Laterality: Right;  [place]: AP%: 10.0 CDE: 9.53   COLONOSCOPY     JOINT REPLACEMENT Bilateral    knee   PARS PLANA VITRECTOMY Right 05/21/2015   Procedure: PARS PLANA VITRECTOMY WITH 25 GAUGE;  Surgeon: Milus Height, MD;  Location: [facility] ORS;  Service: Ophthalmology;  Laterality: Right;  Lot # [id number] Endo laser: Watts: 300 Pulse Duration: 200 Total Pulses:143    shoulder surgery      Home Medications:  Allergies as of 12/09/2020       Reactions   Ramipril Cough, Other (See Comments)   Other reaction(s): Cough Other reaction(s): Cough Other reaction(s): Other (See Comments) Other reaction(s): Cough Other reaction(s): Cough Other reaction(s): Cough        Medication List        Accurate as of December 09, 2020  3:42 PM. If you have any questions, ask your nurse or  doctor.          acetaminophen 325 MG tablet Commonly known as: TYLENOL Take 650 mg by mouth every 6 (six) hours as needed.   allopurinol 300 MG tablet Commonly known as: ZYLOPRIM Take 1 tablet (300 mg total) by mouth  daily.   AMBULATORY NON FORMULARY MEDICATION Medication Name: geni kot natural veggie laxitive   aspirin EC 325 MG tablet Take 325 mg by mouth.   atorvastatin 10 MG tablet Commonly known as: LIPITOR Take 1 tablet (10 mg total) by mouth daily.   betamethasone dipropionate 0.05 % cream Apply topically 2 (two) times daily.   bisoprolol-hydrochlorothiazide 5-6.25 MG tablet Commonly known as: ZIAC Take 1 tablet by mouth daily.   cinacalcet 30 MG tablet Commonly known as: SENSIPAR   citalopram 20 MG tablet Commonly known as: CELEXA TAKE 1 TABLET BY MOUTH ONCE DAILY FOR DEPRESSION   FISH OIL PO Take by mouth.   fluticasone-salmeterol 100-50 MCG/ACT Aepb Commonly known as: ADVAIR Inhale 1 puff into the lungs 2 (two) times daily.   furosemide 40 MG tablet Commonly known as: LASIX TAKE 1 TABLET BY MOUTH ONCE DAILY AND  TAKE  XTRA  TAB  IF  NEEDED   Garlic 7106 MG Caps Take by mouth.   glimepiride 1 MG tablet Commonly known as: AMARYL Take 1 tablet (1 mg total) by mouth daily.   hydrALAZINE 25 MG tablet Commonly known as: APRESOLINE TAKE 1 TABLET BY MOUTH THREE TIMES DAILY FOR BLOOD PRESSURE   HYDROcodone-acetaminophen 5-325 MG tablet Commonly known as: NORCO/VICODIN Take 1 tablet by mouth every 6 (six) hours as needed for moderate pain or severe pain.   ketoconazole 2 % shampoo Commonly known as: NIZORAL SHAMPOO AS DIRECTED   latanoprost 0.005 % ophthalmic solution Commonly known as: XALATAN latanoprost 0.005 % eye drops  INSTILL 1 DROP INTO EACH EYE IN THE EVENING   losartan 25 MG tablet Commonly known as: COZAAR Take 25 mg by mouth daily. Patient takes 1/2 tab daily   meloxicam 7.5 MG tablet Commonly known as: MOBIC TAKE 1 TABLET BY  MOUTH ONCE DAILY FOR  GOUT  AND  ARTHRITIS   mirabegron ER 50 MG Tb24 tablet Commonly known as: MYRBETRIQ Take 1 tablet (50 mg total) by mouth daily. What changed:  medication strength how much to take when to take this Changed by: Julyana Woolverton, PA-C   montelukast 10 MG tablet Commonly known as: SINGULAIR Take 1 tablet by mouth once daily   Pen Needles 32G X 4 MM Misc 1 Units by Does not apply route daily. Use with victoza injections daily.   senna 8.6 MG Tabs tablet Commonly known as: SENOKOT Take 1 tablet by mouth.   Sodium Sulfacetamide 10 % Sham Apply topically.   tizanidine 2 MG capsule Commonly known as: Zanaflex Take one tab po qhs for spasm   traZODone 50 MG tablet Commonly known as: DESYREL Take 0.5-1 tablets (25-50 mg total) by mouth at bedtime as needed for sleep.   True Metrix Blood Glucose Test test strip Generic drug: glucose blood 1 each by Other route in the morning and at bedtime. Dx E11.65   True Metrix Level 1 Low Soln Use as directed   TRUEplus Lancets 33G Misc TEST BLOOD SUGAR IN THE MORNING AND AT BEDTIME   Victoza 18 MG/3ML Sopn Generic drug: liraglutide INJECT 1.8 MG ONCE DAILY SUBCUTANEOUSLY   Vitamin D (Ergocalciferol) 1.25 MG (50000 UNIT) Caps capsule Commonly known as: DRISDOL        Allergies:  Allergies  Allergen Reactions   Ramipril Cough and Other (See Comments)    Other reaction(s): Cough Other reaction(s): Cough Other reaction(s): Other (See Comments) Other reaction(s): Cough Other reaction(s): Cough Other reaction(s): Cough     Family History: Family History  Problem  Relation Age of Onset   Breast cancer Cousin    Breast cancer Other    Hypertension Mother    Diabetes Mother    Hypertension Father    Diabetes Father    Alcohol abuse Father    Aneurysm Sister    Alcohol abuse Sister    Stroke Sister        died from stroke   Hepatitis Brother    Hypertension Sister        dialysis   Diabetes  Sister    Alcoholism Brother    Alcoholism Brother        dies from head injury from a fall.  not an alcoholic,   Cancer Brother    Diabetes Brother    Hypertension Brother    Diabetes Brother    Cancer Brother    Hypertension Brother    Stroke Brother    Alcoholism Brother     Social History:  reports that she has quit smoking. Her smoking use included cigarettes. She has never used smokeless tobacco. She reports that she does not drink alcohol and does not use drugs.  ROS: Pertinent ROS in HPI  Physical Exam: BP 138/74   Pulse 86   Ht 5\' 3"  (1.6 m)   Wt 220 lb (99.8 kg)   BMI 38.97 kg/m   Constitutional:  Well nourished. Alert and oriented, No acute distress. HEENT: [place] AT, mask in place.  Trachea midline Cardiovascular: No clubbing, cyanosis, or edema. Respiratory: Normal respiratory effort, no increased work of breathing. Neurologic: Grossly intact, no focal deficits, moving all 4 extremities. Psychiatric: Normal mood and affect.    Laboratory Data: Lab Results  Component Value Date   WBC 6.3 12/03/2020   HGB 11.0 (L) 12/03/2020   HCT 33.8 (L) 12/03/2020   MCV 84 12/03/2020   PLT 146 (L) 12/03/2020    Lab Results  Component Value Date   CREATININE 1.61 (H) 12/03/2020    Lab Results  Component Value Date   HGBA1C 5.8 (A) 09/04/2020    Lab Results  Component Value Date   TSH 3.436 03/12/2020    Lab Results  Component Value Date   AST 14 (L) 12/03/2020   Lab Results  Component Value Date   ALT 12 12/03/2020     Urinalysis    Component Value Date/Time   COLORURINE YELLOW 11/11/2020 1418   APPEARANCEUR HAZY (A) 11/11/2020 1418   APPEARANCEUR Clear 09/04/2020 1552   LABSPEC 1.020 11/11/2020 1418   PHURINE 5.5 11/11/2020 [place] 11/11/2020 1418   [place] 11/11/2020 1418   [place] 11/11/2020 1418   BILIRUBINUR Negative 09/04/2020 1552   KETONESUR NEGATIVE 11/11/2020 1418   PROTEINUR 100 (A) 11/11/2020  1418   NITRITE NEGATIVE 11/11/2020 1418   LEUKOCYTESUR TRACE (A) 11/11/2020 1418  I have reviewed the labs.   Pertinent Imaging: Component     Latest Ref Rng & Units 12/09/2020  Scan Result      0    Assessment & Plan:    1. OAB -She is at goal with Myrbetriq 50 mg daily -A prescription is sent to her pharmacy and we have also given her samples in case we need to obtain a PA for her to get her medications filled.  2. Incontinence -see #1   Return in about 1 year (around 12/09/2021) for PVR and OAB questionnaire.  These notes generated with voice recognition software. I apologize for typographical errors.  Zara Council, [facility]  [place]  [place], [place] [address] [phone]

## 2020-12-09 ENCOUNTER — Ambulatory Visit: Payer: HMO | Admitting: Urology

## 2020-12-09 ENCOUNTER — Encounter: Payer: Self-pay | Admitting: Urology

## 2020-12-09 ENCOUNTER — Other Ambulatory Visit: Payer: Self-pay

## 2020-12-09 ENCOUNTER — Ambulatory Visit: Payer: HMO

## 2020-12-09 VITALS — BP 138/74 | HR 86 | Ht 63.0 in | Wt 220.0 lb

## 2020-12-09 DIAGNOSIS — N3281 Overactive bladder: Secondary | ICD-10-CM

## 2020-12-09 DIAGNOSIS — N3946 Mixed incontinence: Secondary | ICD-10-CM | POA: Diagnosis not present

## 2020-12-09 LAB — BLADDER SCAN AMB NON-IMAGING: Scan Result: 0

## 2020-12-09 MED ORDER — MIRABEGRON ER 50 MG PO TB24: 50.0000 mg | ORAL_TABLET | Freq: Every day | ORAL | 3 refills | Status: DC

## 2020-12-10 ENCOUNTER — Telehealth: Payer: Self-pay | Admitting: Student-PharmD

## 2020-12-10 NOTE — Progress Notes (Signed)
Chronic Care Management Pharmacy Assistant   Name: Frances Greene  MRN: [id number] DOB: May 19, 1945  Reason for Encounter: CCM Care Plan  Medications: Outpatient Encounter Medications as of 12/10/2020  Medication Sig Note   acetaminophen (TYLENOL) 325 MG tablet Take 650 mg by mouth every 6 (six) hours as needed.    allopurinol (ZYLOPRIM) 300 MG tablet Take 1 tablet (300 mg total) by mouth daily. 12/04/2020: Patient reports taking 1/2 tab daily   AMBULATORY NON FORMULARY MEDICATION Medication Name: geni kot natural veggie laxitive    aspirin EC 325 MG tablet Take 325 mg by mouth.    atorvastatin (LIPITOR) 10 MG tablet Take 1 tablet (10 mg total) by mouth daily.    betamethasone dipropionate 0.05 % cream Apply topically 2 (two) times daily.    bisoprolol-hydrochlorothiazide (ZIAC) 5-6.25 MG tablet Take 1 tablet by mouth daily.    Blood Glucose Calibration (TRUE METRIX LEVEL 1) Low SOLN Use as directed    cinacalcet (SENSIPAR) 30 MG tablet     citalopram (CELEXA) 20 MG tablet TAKE 1 TABLET BY MOUTH ONCE DAILY FOR DEPRESSION 12/04/2020: Pt states taking 1/2 tab daily   fluticasone-salmeterol (ADVAIR) 100-50 MCG/ACT AEPB Inhale 1 puff into the lungs 2 (two) times daily.    furosemide (LASIX) 40 MG tablet TAKE 1 TABLET BY MOUTH ONCE DAILY AND  TAKE  XTRA  TAB  IF  NEEDED 12/04/2020: Patient reports taking only as needed for fluid   Garlic 7564 MG CAPS Take by mouth.    glimepiride (AMARYL) 1 MG tablet Take 1 tablet (1 mg total) by mouth daily. 12/04/2020: Patient reports taking as needed for blood sugars 140 or higher   glucose blood (TRUE METRIX BLOOD GLUCOSE TEST) test strip 1 each by Other route in the morning and at bedtime. Dx E11.65    hydrALAZINE (APRESOLINE) 25 MG tablet TAKE 1 TABLET BY MOUTH THREE TIMES DAILY FOR BLOOD PRESSURE    HYDROcodone-acetaminophen (NORCO/VICODIN) 5-325 MG tablet Take 1 tablet by mouth every 6 (six) hours as needed for moderate pain or severe pain.     Insulin Pen Needle (PEN NEEDLES) 32G X 4 MM MISC 1 Units by Does not apply route daily. Use with victoza injections daily.    ketoconazole (NIZORAL) 2 % shampoo SHAMPOO AS DIRECTED    latanoprost (XALATAN) 0.005 % ophthalmic solution latanoprost 0.005 % eye drops  INSTILL 1 DROP INTO EACH EYE IN THE EVENING    liraglutide (VICTOZA) 18 MG/3ML SOPN INJECT 1.8 MG ONCE DAILY SUBCUTANEOUSLY    losartan (COZAAR) 25 MG tablet Take 25 mg by mouth daily. Patient takes 1/2 tab daily    meloxicam (MOBIC) 7.5 MG tablet TAKE 1 TABLET BY MOUTH ONCE DAILY FOR  GOUT  AND  ARTHRITIS    mirabegron ER (MYRBETRIQ) 50 MG TB24 tablet Take 1 tablet (50 mg total) by mouth daily.    montelukast (SINGULAIR) 10 MG tablet Take 1 tablet by mouth once daily    Omega-3 Fatty Acids (FISH OIL PO) Take by mouth.    senna (SENOKOT) 8.6 MG TABS tablet Take 1 tablet by mouth.    Sulfacetamide Sodium (SODIUM SULFACETAMIDE) 10 % SHAM Apply topically.    tizanidine (ZANAFLEX) 2 MG capsule Take one tab po qhs for spasm    traZODone (DESYREL) 50 MG tablet Take 0.5-1 tablets (25-50 mg total) by mouth at bedtime as needed for sleep.    TRUEplus Lancets 33G MISC TEST BLOOD SUGAR IN THE MORNING AND AT BEDTIME    Vitamin  D, Ergocalciferol, (DRISDOL) 50000 units CAPS capsule     No facility-administered encounter medications on file as of 12/10/2020.    Reviewed the patients initial visit reinsured it was completed per the pharmacist Alena Bills request. Printed the CCM care plan. Mailed the patient CCM care plan to their most recent address on file.   Follow-Up:Pharmacist Review  Charlann Lange, [place] Pharmacist Assistant [phone]

## 2020-12-16 ENCOUNTER — Ambulatory Visit: Payer: HMO | Admitting: Internal Medicine

## 2020-12-17 ENCOUNTER — Ambulatory Visit (INDEPENDENT_AMBULATORY_CARE_PROVIDER_SITE_OTHER): Payer: HMO | Admitting: Nurse Practitioner

## 2020-12-17 ENCOUNTER — Encounter: Payer: Self-pay | Admitting: Nurse Practitioner

## 2020-12-17 ENCOUNTER — Other Ambulatory Visit: Payer: Self-pay

## 2020-12-17 VITALS — BP 160/70 | HR 75 | Temp 98.3°F | Resp 16 | Ht 63.0 in | Wt 229.6 lb

## 2020-12-17 DIAGNOSIS — E785 Hyperlipidemia, unspecified: Secondary | ICD-10-CM | POA: Diagnosis not present

## 2020-12-17 DIAGNOSIS — E6609 Other obesity due to excess calories: Secondary | ICD-10-CM | POA: Diagnosis not present

## 2020-12-17 DIAGNOSIS — N1832 Chronic kidney disease, stage 3b: Secondary | ICD-10-CM | POA: Diagnosis not present

## 2020-12-17 DIAGNOSIS — N184 Chronic kidney disease, stage 4 (severe): Secondary | ICD-10-CM | POA: Diagnosis not present

## 2020-12-17 DIAGNOSIS — I10 Essential (primary) hypertension: Secondary | ICD-10-CM | POA: Diagnosis not present

## 2020-12-17 DIAGNOSIS — N189 Chronic kidney disease, unspecified: Secondary | ICD-10-CM | POA: Diagnosis not present

## 2020-12-17 DIAGNOSIS — J309 Allergic rhinitis, unspecified: Secondary | ICD-10-CM | POA: Diagnosis not present

## 2020-12-17 DIAGNOSIS — G4730 Sleep apnea, unspecified: Secondary | ICD-10-CM | POA: Diagnosis not present

## 2020-12-17 DIAGNOSIS — N183 Chronic kidney disease, stage 3 unspecified: Secondary | ICD-10-CM | POA: Diagnosis not present

## 2020-12-17 DIAGNOSIS — M80022G Age-related osteoporosis with current pathological fracture, left humerus, subsequent encounter for fracture with delayed healing: Secondary | ICD-10-CM | POA: Diagnosis not present

## 2020-12-17 DIAGNOSIS — E1122 Type 2 diabetes mellitus with diabetic chronic kidney disease: Secondary | ICD-10-CM

## 2020-12-17 DIAGNOSIS — Z6841 Body Mass Index (BMI) 40.0 and over, adult: Secondary | ICD-10-CM | POA: Diagnosis not present

## 2020-12-17 DIAGNOSIS — E1159 Type 2 diabetes mellitus with other circulatory complications: Secondary | ICD-10-CM | POA: Diagnosis not present

## 2020-12-17 DIAGNOSIS — D351 Benign neoplasm of parathyroid gland: Secondary | ICD-10-CM | POA: Diagnosis not present

## 2020-12-17 LAB — POCT GLYCOSYLATED HEMOGLOBIN (HGB A1C): Hemoglobin A1C: 5.9 % — AB (ref 4.0–5.6)

## 2020-12-17 MED ORDER — MONTELUKAST SODIUM 10 MG PO TABS: 10.0000 mg | ORAL_TABLET | Freq: Every day | ORAL | 3 refills | Status: DC

## 2020-12-17 NOTE — Progress Notes (Addendum)
[facility], [place] [address]  Internal MEDICINE  Office Visit Note  Patient Name: Frances Greene  [id number]  [id number]  Date of Service: 12/17/2020  Chief Complaint  Patient presents with   Follow-up   Diabetes   Hypertension   Results    HPI Leahmarie presents for a follow up visit for diabetes and hypertension. She also wants to discuss echocardiogram results.  Her diabetes is well-controlled, A1C is 5.9 which is stable and consistent with previous results. She is due for foot exam and needs new diabetic shoes.  She recently had an echocardiogram. There is mild to moderate LV wall thickness with LVEF 55.52%. left atrium is mildly dilated and right atrium is borderline dilated. 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 Mild mitral and triscuspid regurgitation is noted and mild mitral valve sclerosis. Mild elevated of the estimated RV systolic pressure was noted and significant for possible pulmonary hypertension.    Current Medication: Outpatient Encounter Medications as of 12/17/2020  Medication Sig Note   acetaminophen (TYLENOL) 325 MG tablet Take 650 mg by mouth every 6 (six) hours as needed.    allopurinol (ZYLOPRIM) 300 MG tablet Take 1 tablet (300 mg total) by mouth daily. 12/04/2020: Patient reports taking 1/2 tab daily   AMBULATORY NON FORMULARY MEDICATION Medication Name: geni kot natural veggie laxitive    aspirin EC 325 MG tablet Take 325 mg by mouth.    atorvastatin (LIPITOR) 10 MG tablet Take 1 tablet (10 mg total) by mouth daily.    betamethasone dipropionate 0.05 % cream Apply topically 2 (two) times daily.    bisoprolol-hydrochlorothiazide (ZIAC) 5-6.25 MG tablet Take 1 tablet by mouth daily.    Blood Glucose Calibration (TRUE METRIX LEVEL 1) Low SOLN Use as directed    cinacalcet (SENSIPAR) 30 MG tablet     citalopram (CELEXA) 20 MG tablet TAKE 1 TABLET BY MOUTH ONCE DAILY FOR DEPRESSION  12/04/2020: Pt states taking 1/2 tab daily   fluticasone-salmeterol (ADVAIR) 100-50 MCG/ACT AEPB Inhale 1 puff into the lungs 2 (two) times daily.    furosemide (LASIX) 40 MG tablet TAKE 1 TABLET BY MOUTH ONCE DAILY AND  TAKE  XTRA  TAB  IF  NEEDED 12/04/2020: Patient reports taking only as needed for fluid   Garlic 6294 MG CAPS Take by mouth.    glimepiride (AMARYL) 1 MG tablet Take 1 tablet (1 mg total) by mouth daily. 12/04/2020: Patient reports taking as needed for blood sugars 140 or higher   glucose blood (TRUE METRIX BLOOD GLUCOSE TEST) test strip 1 each by Other route in the morning and at bedtime. Dx E11.65    hydrALAZINE (APRESOLINE) 25 MG tablet TAKE 1 TABLET BY MOUTH THREE TIMES DAILY FOR BLOOD PRESSURE    HYDROcodone-acetaminophen (NORCO/VICODIN) 5-325 MG tablet Take 1 tablet by mouth every 6 (six) hours as needed for moderate pain or severe pain.    Insulin Pen Needle (PEN NEEDLES) 32G X 4 MM MISC 1 Units by Does not apply route daily. Use with victoza injections daily.    ketoconazole (NIZORAL) 2 % shampoo SHAMPOO AS DIRECTED    latanoprost (XALATAN) 0.005 % ophthalmic solution latanoprost 0.005 % eye drops  INSTILL 1 DROP INTO EACH EYE IN THE EVENING    liraglutide (VICTOZA) 18 MG/3ML SOPN INJECT 1.8 MG ONCE DAILY SUBCUTANEOUSLY    losartan (COZAAR) 25 MG tablet Take 25 mg by mouth daily. Patient takes 1/2 tab daily    meloxicam (MOBIC) 7.5  MG tablet TAKE 1 TABLET BY MOUTH ONCE DAILY FOR  GOUT  AND  ARTHRITIS    mirabegron ER (MYRBETRIQ) 50 MG TB24 tablet Take 1 tablet (50 mg total) by mouth daily.    Omega-3 Fatty Acids (FISH OIL PO) Take by mouth.    senna (SENOKOT) 8.6 MG TABS tablet Take 1 tablet by mouth.    Sulfacetamide Sodium (SODIUM SULFACETAMIDE) 10 % SHAM Apply topically.    tizanidine (ZANAFLEX) 2 MG capsule Take one tab po qhs for spasm    traZODone (DESYREL) 50 MG tablet Take 0.5-1 tablets (25-50 mg total) by mouth at bedtime as needed for sleep.    TRUEplus Lancets  33G MISC TEST BLOOD SUGAR IN THE MORNING AND AT BEDTIME    Vitamin D, Ergocalciferol, (DRISDOL) 50000 units CAPS capsule     [DISCONTINUED] montelukast (SINGULAIR) 10 MG tablet Take 1 tablet by mouth once daily    montelukast (SINGULAIR) 10 MG tablet Take 1 tablet (10 mg total) by mouth daily.    No facility-administered encounter medications on file as of 12/17/2020.    Surgical History: Past Surgical History:  Procedure Laterality Date   ABDOMINAL HYSTERECTOMY     BREAST BIOPSY Left 1989   neg   CATARACT EXTRACTION W/PHACO Right 05/21/2015   Procedure: CATARACT EXTRACTION PHACO AND INTRAOCULAR LENS PLACEMENT (IOC);  Surgeon: Milus Height, MD;  Location: [facility] ORS;  Service: Ophthalmology;  Laterality: Right;  [place]: AP%: 10.0 CDE: 9.53   COLONOSCOPY     JOINT REPLACEMENT Bilateral    knee   PARS PLANA VITRECTOMY Right 05/21/2015   Procedure: PARS PLANA VITRECTOMY WITH 25 GAUGE;  Surgeon: Milus Height, MD;  Location: [facility] ORS;  Service: Ophthalmology;  Laterality: Right;  Lot # [id number] Endo laser: Watts: 300 Pulse Duration: 200 Total Pulses:143    shoulder surgery      Medical History: Past Medical History:  Diagnosis Date   Arthritis    Asthma    Bronchitis    CHF (congestive heart failure) (HCC)    Chronic kidney disease    decreased kidney function   Diabetes mellitus without complication (HCC)    Gout    Hypertension    Multiple allergies    Shortness of breath dyspnea    w/ exertion   Sleep apnea    CPAP   Swelling of both lower extremities    Wheezing     Family History: Family History  Problem Relation Age of Onset   Breast cancer Cousin    Breast cancer Other    Hypertension Mother    Diabetes Mother    Hypertension Father    Diabetes Father    Alcohol abuse Father    Aneurysm Sister    Alcohol abuse Sister    Stroke Sister        died from stroke   Hepatitis Brother    Hypertension Sister        dialysis   Diabetes Sister    Alcoholism  Brother    Alcoholism Brother        dies from head injury from a fall.  not an alcoholic,   Cancer Brother    Diabetes Brother    Hypertension Brother    Diabetes Brother    Cancer Brother    Hypertension Brother    Stroke Brother    Alcoholism Brother     Social History   Socioeconomic History   Marital status: Divorced    Spouse name: Not on file   Number  of children: Not on file   Years of education: Not on file   Highest education level: Not on file  Occupational History   Not on file  Tobacco Use   Smoking status: Former    Types: Cigarettes   Smokeless tobacco: Never   Tobacco comments:    4 cigarettes quit 20 years ago  Vaping Use   Vaping Use: Never used  Substance and Sexual Activity   Alcohol use: No   Drug use: Never   Sexual activity: Not on file  Other Topics Concern   Not on file  Social History Narrative   Not on file   Social Determinants of Health   Financial Resource Strain: Low Risk    Difficulty of Paying Living Expenses: Not very hard  Food Insecurity: Not on file  Transportation Needs: Not on file  Physical Activity: Not on file  Stress: Not on file  Social Connections: Not on file  Intimate Partner Violence: Not on file      Review of Systems  Constitutional:  Negative for chills, fatigue and unexpected weight change.  HENT:  Negative for congestion, rhinorrhea, sneezing and sore throat.   Eyes:  Negative for redness.  Respiratory:  Negative for cough, chest tightness and shortness of breath.   Cardiovascular:  Negative for chest pain and palpitations.  Gastrointestinal:  Negative for abdominal pain, constipation, diarrhea, nausea and vomiting.  Genitourinary:  Negative for dysuria and frequency.  Musculoskeletal:  Negative for arthralgias, back pain, joint swelling and neck pain.  Skin:  Negative for rash.  Neurological: Negative.  Negative for tremors and numbness.  Hematological:  Negative for adenopathy. Does not  bruise/bleed easily.  Psychiatric/Behavioral:  Negative for behavioral problems (Depression), sleep disturbance and suicidal ideas. The patient is not nervous/anxious.    Vital Signs: BP (!) 160/70 Comment: 173/72  Pulse 75   Temp 98.3 F (36.8 C)   Resp 16   Ht 5\' 3"  (1.6 m)   Wt 229 lb 9.6 oz (104.1 kg)   SpO2 96%   BMI 40.67 kg/m    Physical Exam Vitals reviewed.  Constitutional:      General: She is not in acute distress.    Appearance: Normal appearance. She is obese. She is not ill-appearing.  HENT:     Head: Normocephalic and atraumatic.  Eyes:     Pupils: Pupils are equal, round, and reactive to light.  Cardiovascular:     Rate and Rhythm: Normal rate and regular rhythm.     Pulses:          Dorsalis pedis pulses are 2+ on the right side and 2+ on the left side.       Posterior tibial pulses are 2+ on the right side and 2+ on the left side.  Pulmonary:     Effort: Pulmonary effort is normal. No respiratory distress.  Musculoskeletal:     Right foot: Decreased range of motion. No deformity, bunion, Charcot foot, foot drop or prominent metatarsal heads.     Left foot: Decreased range of motion. No deformity, bunion, Charcot foot, foot drop or prominent metatarsal heads.  Feet:     Right foot:     Protective Sensation: 6 sites tested.  6 sites sensed.     Skin integrity: Callus and dry skin present. No ulcer, blister, skin breakdown, erythema, warmth or fissure.     Toenail Condition: Right toenails are abnormally thick. Fungal disease present.    Left foot:     Protective  Sensation: 6 sites tested.  6 sites sensed.     Skin integrity: Callus and dry skin present. No ulcer, blister, skin breakdown, erythema, warmth or fissure.     Toenail Condition: Left toenails are abnormally thick. Fungal disease present. Neurological:     Mental Status: She is alert and oriented to person, place, and time.     Cranial Nerves: No cranial nerve deficit.     Coordination:  Coordination normal.     Gait: Gait normal.  Psychiatric:        Mood and Affect: Mood normal.        Behavior: Behavior normal.    Diabetic foot exam was performed with the following findings:   Normal sensation of 10g monofilament Intact posterior tibialis and dorsalis pedis pulses See physical exam        Assessment/Plan: 1. Type 2 diabetes mellitus with stage 3b chronic kidney disease, without long-term current use of insulin ([place]) Patient has diabetes, well controlled with medications, A1C is consistently stable, diabetic foot exam was performed today, patient brought her show horn so she will be able to put her shoes back on. Follow up in 6 months for repeat A1c - POCT HgB A1C - For Home Use Only DME Diabetic Shoe  2. Essential hypertension Elevated today but blood pressures have been well controlled at home. No change to current medications, patient will continue to monitor blood pressure at home and will notify clinic if her numbers are consistently elevated.   3. Chronic allergic rhinitis Refills ordered. - montelukast (SINGULAIR) 10 MG tablet; Take 1 tablet (10 mg total) by mouth daily.  Dispense: 90 tablet; Refill: 3   General Counseling: Khadijah verbalizes understanding of the findings of todays visit and agrees with plan of treatment. I have discussed any further diagnostic evaluation that may be needed or ordered today. We also reviewed her medications today. she has been encouraged to call the office with any questions or concerns that should arise related to todays visit.    Orders Placed This Encounter  Procedures   For Home Use Only DME Diabetic Shoe   POCT HgB A1C    Meds ordered this encounter  Medications   montelukast (SINGULAIR) 10 MG tablet    Sig: Take 1 tablet (10 mg total) by mouth daily.    Dispense:  90 tablet    Refill:  3     Return in about 6 months (around 06/16/2021) for F/U, Recheck A1C, Legaci Tarman PCP.   Total time spent:30  Minutes Time spent includes review of chart, medications, test results, and follow up plan with the patient.   [place] Controlled Substance Database was reviewed by me.  This patient was seen by Jonetta Osgood, FNP-C in collaboration with Dr. Clayborn Bigness as a part of collaborative care agreement.   Nevan Creighton R. Valetta Fuller, MSN, FNP-C Internal medicine

## 2020-12-18 ENCOUNTER — Ambulatory Visit: Payer: HMO | Admitting: Internal Medicine

## 2020-12-19 ENCOUNTER — Encounter: Payer: Self-pay | Admitting: Nurse Practitioner

## 2020-12-23 ENCOUNTER — Encounter: Payer: Self-pay | Admitting: Ophthalmology

## 2020-12-23 DIAGNOSIS — H2512 Age-related nuclear cataract, left eye: Secondary | ICD-10-CM | POA: Diagnosis not present

## 2020-12-25 ENCOUNTER — Telehealth: Payer: Self-pay

## 2020-12-25 NOTE — Telephone Encounter (Signed)
Faxed  [facility] for diabetic shoe with note [phone]

## 2020-12-26 ENCOUNTER — Ambulatory Visit: Payer: HMO

## 2020-12-29 ENCOUNTER — Telehealth: Payer: Self-pay | Admitting: Oncology

## 2020-12-29 NOTE — Telephone Encounter (Signed)
Spoke with patient about moving appointments from [place] to [place]. Patient is agreeable to all days and times. Will send updated AVS in the mail.

## 2020-12-29 NOTE — Discharge Instructions (Signed)

## 2020-12-31 ENCOUNTER — Other Ambulatory Visit: Payer: Self-pay

## 2020-12-31 ENCOUNTER — Ambulatory Visit
Admission: RE | Admit: 2020-12-31 | Discharge: 2020-12-31 | Disposition: A | Discharge status: Home / Self Care | Payer: HMO | Attending: Ophthalmology | Admitting: Ophthalmology

## 2020-12-31 ENCOUNTER — Encounter: Payer: Self-pay | Admitting: Ophthalmology

## 2020-12-31 ENCOUNTER — Ambulatory Visit: Payer: HMO | Admitting: Anesthesiology

## 2020-12-31 ENCOUNTER — Encounter
Admission: RE | Disposition: A | Discharge status: Home / Self Care | Payer: Self-pay | Source: Home / Self Care | Attending: Ophthalmology

## 2020-12-31 DIAGNOSIS — Z6841 Body Mass Index (BMI) 40.0 and over, adult: Secondary | ICD-10-CM | POA: Diagnosis not present

## 2020-12-31 DIAGNOSIS — E669 Obesity, unspecified: Secondary | ICD-10-CM | POA: Insufficient documentation

## 2020-12-31 DIAGNOSIS — H25812 Combined forms of age-related cataract, left eye: Secondary | ICD-10-CM | POA: Diagnosis not present

## 2020-12-31 DIAGNOSIS — N189 Chronic kidney disease, unspecified: Secondary | ICD-10-CM | POA: Insufficient documentation

## 2020-12-31 DIAGNOSIS — I130 Hypertensive heart and chronic kidney disease with heart failure and stage 1 through stage 4 chronic kidney disease, or unspecified chronic kidney disease: Secondary | ICD-10-CM | POA: Insufficient documentation

## 2020-12-31 DIAGNOSIS — H2512 Age-related nuclear cataract, left eye: Secondary | ICD-10-CM | POA: Insufficient documentation

## 2020-12-31 DIAGNOSIS — I509 Heart failure, unspecified: Secondary | ICD-10-CM | POA: Insufficient documentation

## 2020-12-31 DIAGNOSIS — E1136 Type 2 diabetes mellitus with diabetic cataract: Secondary | ICD-10-CM | POA: Insufficient documentation

## 2020-12-31 DIAGNOSIS — E1122 Type 2 diabetes mellitus with diabetic chronic kidney disease: Secondary | ICD-10-CM | POA: Diagnosis not present

## 2020-12-31 HISTORY — DX: Cardiac murmur, unspecified: R01.1

## 2020-12-31 HISTORY — PX: CATARACT EXTRACTION W/PHACO: SHX586

## 2020-12-31 LAB — GLUCOSE, CAPILLARY
Glucose-Capillary: 92 mg/dL (ref 70–99)
Glucose-Capillary: 103 mg/dL — ABNORMAL HIGH (ref 70–99)

## 2020-12-31 SURGERY — PHACOEMULSIFICATION, CATARACT, WITH IOL INSERTION
Anesthesia: Monitor Anesthesia Care | Site: Eye | Laterality: Left

## 2020-12-31 MED ORDER — FENTANYL CITRATE (PF) 100 MCG/2ML IJ SOLN
INTRAMUSCULAR | Status: DC | PRN
  Administered 2020-12-31: 50 ug via INTRAVENOUS

## 2020-12-31 MED ORDER — SIGHTPATH DOSE#1 BSS IO SOLN
INTRAOCULAR | Status: DC | PRN
  Administered 2020-12-31: 1 mL via INTRAMUSCULAR

## 2020-12-31 MED ORDER — SIGHTPATH DOSE#1 NA HYALUR & NA CHOND-NA HYALUR IO KIT
PACK | INTRAOCULAR | Status: DC | PRN
  Administered 2020-12-31: 1 via OPHTHALMIC

## 2020-12-31 MED ORDER — BRIMONIDINE TARTRATE-TIMOLOL 0.2-0.5 % OP SOLN
OPHTHALMIC | Status: DC | PRN
  Administered 2020-12-31: 1 [drp] via OPHTHALMIC

## 2020-12-31 MED ORDER — SIGHTPATH DOSE#1 BSS IO SOLN
INTRAOCULAR | Status: DC | PRN
  Administered 2020-12-31: 08:00:00 88 mL via OPHTHALMIC

## 2020-12-31 MED ORDER — ONDANSETRON HCL 4 MG/2ML IJ SOLN: 4.0000 mg | Freq: Once | INTRAMUSCULAR | Status: DC | PRN

## 2020-12-31 MED ORDER — TETRACAINE HCL 0.5 % OP SOLN
1.0000 [drp] | OPHTHALMIC | Status: DC | PRN
  Administered 2020-12-31 (×3): 1 [drp] via OPHTHALMIC

## 2020-12-31 MED ORDER — LACTATED RINGERS IV SOLN: INTRAVENOUS | Status: DC

## 2020-12-31 MED ORDER — MIDAZOLAM HCL 2 MG/2ML IJ SOLN
INTRAMUSCULAR | Status: DC | PRN
  Administered 2020-12-31 (×2): 1 mg via INTRAVENOUS

## 2020-12-31 MED ORDER — ARMC OPHTHALMIC DILATING DROPS
1.0000 "application " | OPHTHALMIC | Status: DC | PRN
  Administered 2020-12-31 (×3): 1 via OPHTHALMIC

## 2020-12-31 MED ORDER — SIGHTPATH DOSE#1 BSS IO SOLN
INTRAOCULAR | Status: DC | PRN
  Administered 2020-12-31: 15 mL

## 2020-12-31 MED ORDER — ACETAMINOPHEN 10 MG/ML IV SOLN: 1000.0000 mg | Freq: Once | INTRAVENOUS | Status: DC | PRN

## 2020-12-31 MED ORDER — CEFUROXIME OPHTHALMIC INJECTION 1 MG/0.1 ML
INJECTION | OPHTHALMIC | Status: DC | PRN
  Administered 2020-12-31: 0.1 mL

## 2020-12-31 SURGICAL SUPPLY — 11 items
CANNULA ANT/CHMB 27G (MISCELLANEOUS) IMPLANT
CANNULA ANT/CHMB 27GA (MISCELLANEOUS) ×2 IMPLANT
GLOVE SRG 8 PF TXTR STRL LF DI (GLOVE) ×2 IMPLANT
GLOVE SURG ENC TEXT LTX SZ7.5 (GLOVE) ×3 IMPLANT
GLOVE SURG UNDER POLY LF SZ8 (GLOVE) ×2
LENS IOL ACRYSOF IQ 22.5 (Intraocular Lens) ×1 IMPLANT
NDL FILTER BLUNT 18X1 1/2 (NEEDLE) ×2 IMPLANT
NEEDLE FILTER BLUNT 18X 1/2SAF (NEEDLE) ×1
NEEDLE FILTER BLUNT 18X1 1/2 (NEEDLE) ×1 IMPLANT
SYR 3ML LL SCALE MARK (SYRINGE) ×3 IMPLANT
WATER STERILE IRR 250ML POUR (IV SOLUTION) ×3 IMPLANT

## 2020-12-31 NOTE — Transfer of Care (Signed)
Immediate Anesthesia Transfer of Care Note  Patient: Frances Greene  Procedure(s) Performed: CATARACT EXTRACTION PHACO AND INTRAOCULAR LENS PLACEMENT (IOC) LEFT DIABETIC 4.13 01:00.9 (Left: Eye)  Patient Location: PACU  Anesthesia Type: MAC  Level of Consciousness: awake, alert  and patient cooperative  Airway and Oxygen Therapy: Patient Spontanous Breathing and Patient connected to supplemental oxygen  Post-op Assessment: Post-op Vital signs reviewed, Patient's Cardiovascular Status Stable, Respiratory Function Stable, Patent Airway and No signs of Nausea or vomiting  Post-op Vital Signs: Reviewed and stable  Complications: No notable events documented.

## 2020-12-31 NOTE — Anesthesia Preprocedure Evaluation (Signed)
Anesthesia Evaluation  Patient identified by MRN, date of birth, ID band Patient awake    Reviewed: Allergy & Precautions, NPO status , Patient's Chart, lab work & pertinent test results, reviewed documented beta blocker date and time   History of Anesthesia Complications Negative for: history of anesthetic complications  Airway Mallampati: III  TM Distance: >3 FB Neck ROM: Limited    Dental  (+) Partial Upper, Partial Lower   Pulmonary asthma , sleep apnea , Current SmokerPatient did not abstain from smoking., former smoker,    breath sounds clear to auscultation       Cardiovascular hypertension, Pt. on medications and Pt. on home beta blockers (-) angina+CHF  (-) DOE + dysrhythmias + Valvular Problems/Murmurs (Mod AI)  Rhythm:Regular Rate:Normal   HLD   Neuro/Psych PSYCHIATRIC DISORDERS Anxiety Depression  Neuromuscular disease (Sciatica)    GI/Hepatic neg GERD  ,  Endo/Other  diabetes, Type 2Hypothyroidism   Renal/GU CRFRenal disease     Musculoskeletal  (+) Arthritis ,  Gout   Abdominal (+) + obese (BMI 41),   Peds  Hematology  (+) anemia ,   Anesthesia Other Findings   Reproductive/Obstetrics                             Anesthesia Physical Anesthesia Plan  ASA: 3  Anesthesia Plan: MAC   Post-op Pain Management:    Induction: Intravenous  PONV Risk Score and Plan: 2 and TIVA, Midazolam and Treatment may vary due to age or medical condition  Airway Management Planned: Nasal Cannula  Additional Equipment:   Intra-op Plan:   Post-operative Plan:   Informed Consent: I have reviewed the patients History and Physical, chart, labs and discussed the procedure including the risks, benefits and alternatives for the proposed anesthesia with the patient or authorized representative who has indicated his/her understanding and acceptance.       Plan Discussed with: CRNA and  Anesthesiologist  Anesthesia Plan Comments:         Anesthesia Quick Evaluation

## 2020-12-31 NOTE — H&P (Signed)
[facility]   Primary Care Physician:  Jonetta Osgood, NP Ophthalmologist: Dr. Leandrew Koyanagi  Pre-Procedure History & Physical: HPI:  Frances Greene is a 75 y.o. female here for ophthalmic surgery.   Past Medical History:  Diagnosis Date   Arthritis    Asthma    Bronchitis    CHF (congestive heart failure) (HCC)    Chronic kidney disease    decreased kidney function   Diabetes mellitus without complication (HCC)    Gout    Heart murmur    Hypertension    Multiple allergies    Shortness of breath dyspnea    w/ exertion   Sleep apnea    CPAP   Swelling of both lower extremities    Wheezing     Past Surgical History:  Procedure Laterality Date   ABDOMINAL HYSTERECTOMY     BREAST BIOPSY Left 1989   neg   CATARACT EXTRACTION W/PHACO Right 05/21/2015   Procedure: CATARACT EXTRACTION PHACO AND INTRAOCULAR LENS PLACEMENT ([place]);  Surgeon: Milus Height, MD;  Location: [facility] ORS;  Service: Ophthalmology;  Laterality: Right;  [place]: AP%: 10.0 CDE: 9.53   COLONOSCOPY     JOINT REPLACEMENT Bilateral    knee   PARS PLANA VITRECTOMY Right 05/21/2015   Procedure: PARS PLANA VITRECTOMY WITH 25 GAUGE;  Surgeon: Milus Height, MD;  Location: [facility] ORS;  Service: Ophthalmology;  Laterality: Right;  Lot # [id number] Endo laser: Watts: 300 Pulse Duration: 200 Total Pulses:143    shoulder surgery      Prior to Admission medications   Medication Sig Start Date End Date Taking? Authorizing Provider  acetaminophen (TYLENOL) 325 MG tablet Take 650 mg by mouth every 6 (six) hours as needed.   Yes [provider]  allopurinol (ZYLOPRIM) 300 MG tablet Take 1 tablet (300 mg total) by mouth daily. 06/10/20  Yes Lavera Guise, MD  AMBULATORY NON FORMULARY MEDICATION Medication Name: geni kot natural veggie laxitive   Yes [provider]  aspirin EC 325 MG tablet Take 325 mg by mouth.   Yes [provider]  atorvastatin (LIPITOR) 10 MG tablet Take 1  tablet (10 mg total) by mouth daily. 08/05/20  Yes Lavera Guise, MD  bisoprolol-hydrochlorothiazide [facility]) 5-6.25 MG tablet Take 1 tablet by mouth daily. 11/13/20  Yes Abernathy, Yetta Flock, NP  Cholecalciferol (VITAMIN D3) 1.25 MG (50000 UT) CAPS Take by mouth daily.   Yes [provider]  cinacalcet (SENSIPAR) 30 MG tablet  06/18/19  Yes [provider]  citalopram (CELEXA) 20 MG tablet TAKE 1 TABLET BY MOUTH ONCE DAILY FOR DEPRESSION 10/28/20  Yes Abernathy, Alyssa, NP  fluticasone-salmeterol (ADVAIR) 100-50 MCG/ACT AEPB Inhale 1 puff into the lungs 2 (two) times daily. 07/09/20  Yes Abernathy, Alyssa, NP  furosemide (LASIX) 40 MG tablet TAKE 1 TABLET BY MOUTH ONCE DAILY AND  TAKE  XTRA  TAB  IF  NEEDED 09/27/19  Yes Scarboro, Audie Clear, NP  Garlic 1275 MG CAPS Take by mouth.   Yes [provider]  glimepiride (AMARYL) 1 MG tablet Take 1 tablet (1 mg total) by mouth daily. 09/27/19  Yes Scarboro, Audie Clear, NP  hydrALAZINE (APRESOLINE) 25 MG tablet TAKE 1 TABLET BY MOUTH THREE TIMES DAILY FOR BLOOD PRESSURE 10/01/20  Yes Lavera Guise, MD  HYDROcodone-acetaminophen (NORCO/VICODIN) 5-325 MG tablet Take 1 tablet by mouth every 6 (six) hours as needed for moderate pain or severe pain. 12/28/17  Yes Delman Kitten, MD  ketoconazole (NIZORAL) 2 % shampoo  SHAMPOO AS DIRECTED 07/19/18  Yes Lavera Guise, MD  latanoprost (XALATAN) 0.005 % ophthalmic solution latanoprost 0.005 % eye drops  INSTILL 1 DROP INTO EACH EYE IN THE EVENING   Yes [provider]  liraglutide (VICTOZA) 18 MG/3ML SOPN INJECT 1.8 MG ONCE DAILY SUBCUTANEOUSLY 01/16/20  Yes Lavera Guise, MD  losartan (COZAAR) 25 MG tablet Take 25 mg by mouth daily. Patient takes 1/2 tab daily   Yes [provider]  meloxicam (MOBIC) 7.5 MG tablet TAKE 1 TABLET BY MOUTH ONCE DAILY FOR  GOUT  AND  ARTHRITIS 06/08/18  Yes Lavera Guise, MD  mirabegron ER (MYRBETRIQ) 50 MG TB24 tablet Take 1 tablet (50 mg total) by mouth daily.  12/09/20  Yes McGowan, Larene Beach A, PA-C  montelukast (SINGULAIR) 10 MG tablet Take 1 tablet (10 mg total) by mouth daily. 12/17/20  Yes Abernathy, Yetta Flock, NP  Omega-3 Fatty Acids (FISH OIL PO) Take by mouth.   Yes [provider]  senna (SENOKOT) 8.6 MG TABS tablet Take 1 tablet by mouth.   Yes [provider]  tizanidine (ZANAFLEX) 2 MG capsule Take one tab po qhs for spasm 04/01/20  Yes Lavera Guise, MD  traZODone (DESYREL) 50 MG tablet Take 0.5-1 tablets (25-50 mg total) by mouth at bedtime as needed for sleep. 11/13/20  Yes Abernathy, Yetta Flock, NP  Vitamin D, Ergocalciferol, (DRISDOL) 50000 units CAPS capsule every 7 (seven) days. 03/06/15  Yes [provider]  betamethasone dipropionate 0.05 % cream Apply topically 2 (two) times daily. 11/13/20   Jonetta Osgood, NP  Blood Glucose Calibration (TRUE METRIX LEVEL 1) Low SOLN Use as directed 05/25/19   Kendell Bane, NP  glucose blood (TRUE METRIX BLOOD GLUCOSE TEST) test strip 1 each by Other route in the morning and at bedtime. Dx E11.65 05/28/19   Lavera Guise, MD  Insulin Pen Needle (PEN NEEDLES) 32G X 4 MM MISC 1 Units by Does not apply route daily. Use with victoza injections daily. 01/20/20   Ronnell Freshwater, NP  Sulfacetamide Sodium (SODIUM SULFACETAMIDE) 10 % SHAM Apply topically. Patient not taking: Reported on 12/23/2020    [provider]  TRUEplus Lancets 33G MISC TEST BLOOD SUGAR IN THE MORNING AND AT BEDTIME 01/04/20   Lavera Guise, MD    Allergies as of 12/10/2020 - Review Complete 12/09/2020  Allergen Reaction Noted   Ramipril Cough and Other (See Comments) 04/18/2013    Family History  Problem Relation Age of Onset   Breast cancer Cousin    Breast cancer Other    Hypertension Mother    Diabetes Mother    Hypertension Father    Diabetes Father    Alcohol abuse Father    Aneurysm Sister    Alcohol abuse Sister    Stroke Sister        died from stroke   Hepatitis Brother     Hypertension Sister        dialysis   Diabetes Sister    Alcoholism Brother    Alcoholism Brother        dies from head injury from a fall.  not an alcoholic,   Cancer Brother    Diabetes Brother    Hypertension Brother    Diabetes Brother    Cancer Brother    Hypertension Brother    Stroke Brother    Alcoholism Brother     Social History   Socioeconomic History   Marital status: Divorced    Spouse  name: Not on file   Number of children: Not on file   Years of education: Not on file   Highest education level: Not on file  Occupational History   Not on file  Tobacco Use   Smoking status: Former    Types: Cigarettes   Smokeless tobacco: Never   Tobacco comments:    4 cigarettes quit 20 years ago  Vaping Use   Vaping Use: Never used  Substance and Sexual Activity   Alcohol use: No   Drug use: Never   Sexual activity: Not on file  Other Topics Concern   Not on file  Social History Narrative   Not on file   Social Determinants of Health   Financial Resource Strain: Low Risk    Difficulty of Paying Living Expenses: Not very hard  Food Insecurity: Not on file  Transportation Needs: Not on file  Physical Activity: Not on file  Stress: Not on file  Social Connections: Not on file  Intimate Partner Violence: Not on file    Review of Systems: See HPI, otherwise negative ROS  Physical Exam: BP (!) 179/83    Pulse 73    Temp (!) 97.2 F (36.2 C) (Temporal)    Resp 16    Ht 5\' 3"  (1.6 m)    Wt 104.3 kg    SpO2 97%    BMI 40.74 kg/m  General:   Alert,  pleasant and cooperative in NAD Head:  Normocephalic and atraumatic. Lungs:  Clear to auscultation.    Heart:  Regular rate and rhythm.   Impression/Plan: Frances Greene is here for ophthalmic surgery.  Risks, benefits, limitations, and alternatives regarding ophthalmic surgery have been reviewed with the patient.  Questions have been answered.  All parties agreeable.   Leandrew Koyanagi, MD  12/31/2020,  7:36 AM

## 2020-12-31 NOTE — Op Note (Signed)
°  OPERATIVE NOTE  LAMANDA RUDDER [id number] 12/31/2020   PREOPERATIVE DIAGNOSIS:  Nuclear sclerotic cataract left eye. H25.12   POSTOPERATIVE DIAGNOSIS:    Nuclear sclerotic cataract left eye.     PROCEDURE:  Phacoemusification with posterior chamber intraocular lens placement of the left eye  Ultrasound time: Procedure(s): CATARACT EXTRACTION PHACO AND INTRAOCULAR LENS PLACEMENT (IOC) LEFT DIABETIC 4.13 01:00.9 (Left)  LENS:   Implant Name Type Inv. Item Serial No. Manufacturer Lot No. LRB No. Used Action  LENS IOL ACRYSOF IQ 22.5 - [id number] Intraocular Lens LENS IOL ACRYSOF IQ 22.5 [id number] ALCON  Left 1 Implanted      SURGEON:  Wyonia Hough, MD   ANESTHESIA:  Topical with tetracaine drops and 2% Xylocaine jelly, augmented with 1% preservative-free intracameral lidocaine.    COMPLICATIONS:  None.   DESCRIPTION OF PROCEDURE:  The patient was identified in the holding room and transported to the operating room and placed in the supine position under the operating microscope.  The left eye was identified as the operative eye and it was prepped and draped in the usual sterile ophthalmic fashion.   A 1 millimeter clear-corneal paracentesis was made at the 1:30 position.  0.5 ml of preservative-free 1% lidocaine was injected into the anterior chamber.  The anterior chamber was filled with Viscoat viscoelastic.  A 2.4 millimeter keratome was used to make a near-clear corneal incision at the 10:30 position.  .  A curvilinear capsulorrhexis was made with a cystotome and capsulorrhexis forceps.  Balanced salt solution was used to hydrodissect and hydrodelineate the nucleus.   Phacoemulsification was then used in stop and chop fashion to remove the lens nucleus and epinucleus.  The remaining cortex was then removed using the irrigation and aspiration handpiece. Provisc was then placed into the capsular bag to distend it for lens placement.  A lens was then injected into the  capsular bag.  The remaining viscoelastic was aspirated.   Wounds were hydrated with balanced salt solution.  The anterior chamber was inflated to a physiologic pressure with balanced salt solution.  No wound leaks were noted. Cefuroxime 0.1 ml of a 10mg /ml solution was injected into the anterior chamber for a dose of 1 mg of intracameral antibiotic at the completion of the case.   Timolol and Brimonidine drops were applied to the eye.  The patient was taken to the recovery room in stable condition without complications of anesthesia or surgery.  Danijah Noh 12/31/2020, 8:12 AM

## 2020-12-31 NOTE — Anesthesia Postprocedure Evaluation (Signed)
Anesthesia Post Note  Patient: Frances Greene  Procedure(s) Performed: CATARACT EXTRACTION PHACO AND INTRAOCULAR LENS PLACEMENT (IOC) LEFT DIABETIC 4.13 01:00.9 (Left: Eye)     Patient location during evaluation: PACU Anesthesia Type: MAC Level of consciousness: awake and alert Pain management: pain level controlled Vital Signs Assessment: post-procedure vital signs reviewed and stable Respiratory status: spontaneous breathing, nonlabored ventilation, respiratory function stable and patient connected to nasal cannula oxygen Cardiovascular status: stable and blood pressure returned to baseline Postop Assessment: no apparent nausea or vomiting Anesthetic complications: no   No notable events documented.  Kamoni Depree A  Laquincy Eastridge

## 2021-01-01 ENCOUNTER — Encounter: Payer: Self-pay | Admitting: Ophthalmology

## 2021-01-02 DIAGNOSIS — E1122 Type 2 diabetes mellitus with diabetic chronic kidney disease: Secondary | ICD-10-CM | POA: Diagnosis not present

## 2021-01-21 ENCOUNTER — Other Ambulatory Visit: Payer: Self-pay | Admitting: Internal Medicine

## 2021-01-21 ENCOUNTER — Other Ambulatory Visit: Payer: Self-pay | Admitting: Nurse Practitioner

## 2021-01-21 DIAGNOSIS — I10 Essential (primary) hypertension: Secondary | ICD-10-CM

## 2021-01-30 ENCOUNTER — Telehealth: Payer: Self-pay | Admitting: Student-PharmD

## 2021-01-30 NOTE — Progress Notes (Addendum)
Hypertension (HTN) Review Call   Frances Greene, Frances Greene [id number] 81 years, Female  DOB: 1945-02-10  M: [phone]  Hypertension Review (HC) Completed by Charlann Lange on 01/30/2021  Chart Review Is the patient enrolled in RPM with BP Monitor?: No  BP #1 reading (last): 160/70 on: 12/17/2020  BP #2 reading: 138/74 on: 12/09/2020  BP #3 reading: 140/70 on: 11/12/2020  Any of the last 3 BP > 140/90 mmHg?: Yes  What recent interventions have been made by any provider to improve the patient's conditions in the last 3 months?:   Office Visit: 12/17/20 Jonetta Osgood, NP. For type 2 diabetes mellitus. No medication changes.  Any recent hospitalizations or ED visits since last visit with CPP?: Yes  Brief Summary (including Medication and/or Diagnosis changes):: 12/31/20 [place] (1 Hour) Leandrew Koyanagi, MD. For CATARACT EXTRACTION PHACO AND INTRAOCULAR LENS PLACEMENT (IOC) LEFT DIABETIC. No medication changes.  Adherence Review Adherence rates for STAR metric medications:  Atorvastatin 10 mg 11/05/20 90 DS Losartan 25 mg 10/07/20 90 DS Victoza 18 mg/71ml SOPN 10/25/20 90 DS Glimepiride 1 mg 10/06/19 90 DS  Adherence rates for medications indicated for disease state being reviewed:  Losartan 25 mg 10/07/20 90 DS  Does the patient have >5 day gap between last estimated fill dates for any of the above medications?: Yes  Reasons for medication gaps:  Glimepiride 1 mg 10/06/19 90 DS-Patient stated she only takes it as needed. Losartan 25 mg 10/07/20 90 DS-Patient stated she only takes 1/2 tablet daily  Disease State Questions Able to connect with the Patient?: Yes  Is the patient monitoring his/her BP?: No  Review recommendations from CPP's note of how often patient should be checking and encourage monitoring blood pressures if patient has history of high BP.: Done  What is your blood pressure goal?: 120/80  Educate patient to inform proper points on  checking BP at home:: Do not drink caffeine or smoke a cigarette at least 30 min. prior to checking.  What diet changes have you made to improve your Blood Pressure Control?: eating more home-cooked meals, limiting processed foods  What exercise are you doing to improve your Blood Pressure Control?: no formal exercise  Charlann Lange, RMA Clinical Pharmacist Assistant [phone]  Pharmacist Review Adherence gaps identified?: Yes Details: Patient only taking 1/2 tab of her losartan Drug Therapy Problems identified?: Yes Details: see above Assessment: Uncontrolled Plan: Has f/u visit with me in 1 month, will monitor and address  5 minutes spent in review, coordination, and documentation.  Reviewed by: Alena Bills, PharmD Clinical Pharmacist [phone]

## 2021-02-03 DIAGNOSIS — H35372 Puckering of macula, left eye: Secondary | ICD-10-CM | POA: Diagnosis not present

## 2021-02-12 ENCOUNTER — Other Ambulatory Visit: Payer: Self-pay

## 2021-02-12 MED ORDER — ACCU-CHEK GUIDE VI STRP: ORAL_STRIP | 1 refills | Status: DC

## 2021-02-12 MED ORDER — ONETOUCH VERIO REFLECT W/DEVICE KIT
PACK | 0 refills | Status: AC
Start: 2021-02-12 — End: ?

## 2021-02-12 MED ORDER — ACCU-CHEK SOFTCLIX LANCETS MISC: 1 refills | Status: DC

## 2021-02-12 MED ORDER — ONETOUCH DELICA LANCETS 33G MISC: 1 refills | Status: DC

## 2021-02-12 MED ORDER — ONETOUCH VERIO VI STRP: ORAL_STRIP | 1 refills | Status: DC

## 2021-02-12 MED ORDER — ACCU-CHEK GUIDE W/DEVICE KIT: PACK | 0 refills | Status: DC

## 2021-02-12 NOTE — Telephone Encounter (Signed)
Lmom message to that we send med

## 2021-02-15 DIAGNOSIS — I10 Essential (primary) hypertension: Secondary | ICD-10-CM | POA: Diagnosis not present

## 2021-02-15 DIAGNOSIS — E1122 Type 2 diabetes mellitus with diabetic chronic kidney disease: Secondary | ICD-10-CM | POA: Diagnosis not present

## 2021-02-15 DIAGNOSIS — N183 Chronic kidney disease, stage 3 unspecified: Secondary | ICD-10-CM | POA: Diagnosis not present

## 2021-02-23 ENCOUNTER — Encounter: Payer: Self-pay | Admitting: Oncology

## 2021-02-23 ENCOUNTER — Ambulatory Visit
Admission: RE | Admit: 2021-02-23 | Discharge: 2021-02-23 | Disposition: A | Discharge status: Home / Self Care | Payer: HMO | Source: Ambulatory Visit | Attending: Internal Medicine | Admitting: Internal Medicine

## 2021-02-23 ENCOUNTER — Other Ambulatory Visit: Payer: Self-pay

## 2021-02-23 DIAGNOSIS — Z1231 Encounter for screening mammogram for malignant neoplasm of breast: Secondary | ICD-10-CM | POA: Insufficient documentation

## 2021-02-24 ENCOUNTER — Encounter: Payer: Self-pay | Admitting: Internal Medicine

## 2021-02-24 ENCOUNTER — Other Ambulatory Visit: Payer: Self-pay | Admitting: Internal Medicine

## 2021-02-24 ENCOUNTER — Ambulatory Visit: Payer: HMO | Admitting: Internal Medicine

## 2021-02-24 ENCOUNTER — Telehealth: Payer: Self-pay

## 2021-02-24 VITALS — BP 154/68 | HR 73 | Temp 98.3°F | Resp 16 | Ht 63.0 in | Wt 226.6 lb

## 2021-02-24 DIAGNOSIS — R0602 Shortness of breath: Secondary | ICD-10-CM

## 2021-02-24 DIAGNOSIS — N183 Chronic kidney disease, stage 3 unspecified: Secondary | ICD-10-CM | POA: Diagnosis not present

## 2021-02-24 DIAGNOSIS — G4733 Obstructive sleep apnea (adult) (pediatric): Secondary | ICD-10-CM | POA: Diagnosis not present

## 2021-02-24 DIAGNOSIS — R928 Other abnormal and inconclusive findings on diagnostic imaging of breast: Secondary | ICD-10-CM

## 2021-02-24 DIAGNOSIS — R921 Mammographic calcification found on diagnostic imaging of breast: Secondary | ICD-10-CM

## 2021-02-24 NOTE — Telephone Encounter (Signed)
Cpap titration order placed. Printed. Gave to [employer]

## 2021-02-24 NOTE — Patient Instructions (Signed)
Sleep Apnea ?Sleep apnea affects breathing during sleep. It causes breathing to stop for 10 seconds or more, or to become shallow. People with sleep apnea usually snore loudly. ?It can also increase the risk of: ?Heart attack. ?Stroke. ?Being very overweight (obese). ?Diabetes. ?Heart failure. ?Irregular heartbeat. ?High blood pressure. ?The goal of treatment is to help you breathe normally again. ?What are the causes? ?The most common cause of this condition is a collapsed or blocked airway. ?There are three kinds of sleep apnea: ?Obstructive sleep apnea. This is caused by a blocked or collapsed airway. ?Central sleep apnea. This happens when the brain does not send the right signals to the muscles that control breathing. ?Mixed sleep apnea. This is a combination of obstructive and central sleep apnea. ?What increases the risk? ?Being overweight. ?Smoking. ?Having a small airway. ?Being older. ?Being female. ?Drinking alcohol. ?Taking medicines to calm yourself (sedatives or tranquilizers). ?Having family members with the condition. ?Having a tongue or tonsils that are larger than normal. ?What are the signs or symptoms? ?Trouble staying asleep. ?Loud snoring. ?Headaches in the morning. ?Waking up gasping. ?Dry mouth or sore throat in the morning. ?Being sleepy or tired during the day. ?If you are sleepy or tired during the day, you may also: ?Not be able to focus your mind (concentrate). ?Forget things. ?Get angry a lot and have mood swings. ?Feel sad (depressed). ?Have changes in your personality. ?Have less interest in sex, if you are female. ?Be unable to have an erection, if you are female. ?How is this treated? ? ?Sleeping on your side. ?Using a medicine to get rid of mucus in your nose (decongestant). ?Avoiding the use of alcohol, medicines to help you relax, or certain pain medicines (narcotics). ?Losing weight, if needed. ?Changing your diet. ?Quitting smoking. ?Using a machine to open your airway while you  sleep, such as: ?An oral appliance. This is a mouthpiece that shifts your lower jaw forward. ?A CPAP device. This device blows air through a mask when you breathe out (exhale). ?An EPAP device. This has valves that you put in each nostril. ?A BIPAP device. This device blows air through a mask when you breathe in (inhale) and breathe out. ?Having surgery if other treatments do not work. ?Follow these instructions at home: ?Lifestyle ?Make changes that your doctor recommends. ?Eat a healthy diet. ?Lose weight if needed. ?Avoid alcohol, medicines to help you relax, and some pain medicines. ?Do not smoke or use any products that contain nicotine or tobacco. If you need help quitting, ask your doctor. ?General instructions ?Take over-the-counter and prescription medicines only as told by your doctor. ?If you were given a machine to use while you sleep, use it only as told by your doctor. ?If you are having surgery, make sure to tell your doctor you have sleep apnea. You may need to bring your device with you. ?Keep all follow-up visits. ?Contact a doctor if: ?The machine that you were given to use during sleep bothers you or does not seem to be working. ?You do not get better. ?You get worse. ?Get help right away if: ?Your chest hurts. ?You have trouble breathing in enough air. ?You have an uncomfortable feeling in your back, arms, or stomach. ?You have trouble talking. ?One side of your body feels weak. ?A part of your face is hanging down. ?These symptoms may be an emergency. Get help right away. Call your local emergency services (911 in the [place] ?Do not wait to see if the symptoms   will go away. ?Do not drive yourself to the hospital. ?Summary ?This condition affects breathing during sleep. ?The most common cause is a collapsed or blocked airway. ?The goal of treatment is to help you breathe normally while you sleep. ?This information is not intended to replace advice given to you by your health care provider. Make  sure you discuss any questions you have with your health care provider. ?Document Revised: 08/13/2020 Document Reviewed: 12/14/2019 ?Elsevier Patient Education ? 2022 [employer] ? ?

## 2021-02-24 NOTE — Progress Notes (Signed)
[facility] [place], [place] [address]  Pulmonary Sleep Medicine   Office Visit Note  Patient Name: Frances Greene DOB: April 14, 1945 MRN [id number]  Date of Service: 02/24/2021  Complaints/HPI: OSA. She had severe OSA did not get the second study done as she states she did not have money to go in and will not be able to afford CPAP. Her AHI was 53 per hour.  I spoke with her that we will try to see if something can be done to help her out through the DME provider is some sometimes they do have programs whereby it could be more affordable for her to use.  I especially explained to her that due to the severity of her AHI it is highly recommended that we try to get her set up in addition to that she does have ankle swelling and secondary organ dysfunction.  ROS  General: (-) fever, (-) chills, (-) night sweats, (-) weakness Skin: (-) rashes, (-) itching,. Eyes: (-) visual changes, (-) redness, (-) itching. Nose and Sinuses: (-) nasal stuffiness or itchiness, (-) postnasal drip, (-) nosebleeds, (-) sinus trouble. Mouth and Throat: (-) sore throat, (-) hoarseness. Neck: (-) swollen glands, (-) enlarged thyroid, (-) neck pain. Respiratory: - cough, (-) bloody sputum, + shortness of breath, + wheezing. Cardiovascular: + ankle swelling, (-) chest pain. Lymphatic: (-) lymph node enlargement. Neurologic: (-) numbness, (-) tingling. Psychiatric: (-) anxiety, (-) depression   Current Medication: Outpatient Encounter Medications as of 02/24/2021  Medication Sig Note   acetaminophen (TYLENOL) 325 MG tablet Take 650 mg by mouth every 6 (six) hours as needed.    allopurinol (ZYLOPRIM) 300 MG tablet Take 1 tablet (300 mg total) by mouth daily. 12/04/2020: Patient reports taking 1/2 tab daily   AMBULATORY NON FORMULARY MEDICATION Medication Name: geni kot natural veggie laxitive    aspirin EC 325 MG tablet Take 325 mg by mouth.    atorvastatin (LIPITOR) 10 MG tablet Take  1 tablet by mouth once daily    betamethasone dipropionate 0.05 % cream Apply topically 2 (two) times daily.    bisoprolol-hydrochlorothiazide (ZIAC) 5-6.25 MG tablet Take 1 tablet by mouth once daily    Blood Glucose Monitoring Suppl (ONETOUCH VERIO REFLECT) w/Device KIT Use as directed for once daily Dx E11.65    Cholecalciferol (VITAMIN D3) 1.25 MG (50000 UT) CAPS Take by mouth daily.    cinacalcet (SENSIPAR) 30 MG tablet     citalopram (CELEXA) 20 MG tablet TAKE 1 TABLET BY MOUTH ONCE DAILY FOR DEPRESSION 12/04/2020: Pt states taking 1/2 tab daily   fluticasone-salmeterol (ADVAIR) 100-50 MCG/ACT AEPB Inhale 1 puff into the lungs 2 (two) times daily.    furosemide (LASIX) 40 MG tablet TAKE 1 TABLET BY MOUTH ONCE DAILY AND  TAKE  XTRA  TAB  IF  NEEDED 12/04/2020: Patient reports taking only as needed for fluid   Garlic 9476 MG CAPS Take by mouth.    glimepiride (AMARYL) 1 MG tablet Take 1 tablet (1 mg total) by mouth daily. 12/04/2020: Patient reports taking as needed for blood sugars 140 or higher   glucose blood (ONETOUCH VERIO) test strip Use as directed once a daily    hydrALAZINE (APRESOLINE) 25 MG tablet TAKE 1 TABLET BY MOUTH THREE TIMES DAILY FOR BLOOD PRESSURE    HYDROcodone-acetaminophen (NORCO/VICODIN) 5-325 MG tablet Take 1 tablet by mouth every 6 (six) hours as needed for moderate pain or severe pain.    Insulin Pen Needle (PEN NEEDLES) 32G X  4 MM MISC 1 Units by Does not apply route daily. Use with victoza injections daily.    ketoconazole (NIZORAL) 2 % shampoo SHAMPOO AS DIRECTED    latanoprost (XALATAN) 0.005 % ophthalmic solution latanoprost 0.005 % eye drops  INSTILL 1 DROP INTO EACH EYE IN THE EVENING    liraglutide (VICTOZA) 18 MG/3ML SOPN INJECT 1.8 MG ONCE DAILY SUBCUTANEOUSLY    losartan (COZAAR) 25 MG tablet Take 25 mg by mouth daily. Patient takes 1/2 tab daily    meloxicam (MOBIC) 7.5 MG tablet TAKE 1 TABLET BY MOUTH ONCE DAILY FOR  GOUT  AND  ARTHRITIS    mirabegron  ER (MYRBETRIQ) 50 MG TB24 tablet Take 1 tablet (50 mg total) by mouth daily.    montelukast (SINGULAIR) 10 MG tablet Take 1 tablet (10 mg total) by mouth daily.    Omega-3 Fatty Acids (FISH OIL PO) Take by mouth.    OneTouch Delica Lancets 22Q MISC Use as directed once a daily DxE11.65    senna (SENOKOT) 8.6 MG TABS tablet Take 1 tablet by mouth.    Sulfacetamide Sodium (SODIUM SULFACETAMIDE) 10 % SHAM Apply topically.    tizanidine (ZANAFLEX) 2 MG capsule Take one tab po qhs for spasm    traZODone (DESYREL) 50 MG tablet Take 0.5-1 tablets (25-50 mg total) by mouth at bedtime as needed for sleep.    Vitamin D, Ergocalciferol, (DRISDOL) 50000 units CAPS capsule every 7 (seven) days.    No facility-administered encounter medications on file as of 02/24/2021.    Surgical History: Past Surgical History:  Procedure Laterality Date   ABDOMINAL HYSTERECTOMY     BREAST BIOPSY Left 1989   neg   CATARACT EXTRACTION W/PHACO Right 05/21/2015   Procedure: CATARACT EXTRACTION PHACO AND INTRAOCULAR LENS PLACEMENT (IOC);  Surgeon: Milus Height, MD;  Location: [facility];  Service: Ophthalmology;  Laterality: Right;  [place]: AP%: 10.0 CDE: 9.53   CATARACT EXTRACTION W/PHACO Left 12/31/2020   Procedure: CATARACT EXTRACTION PHACO AND INTRAOCULAR LENS PLACEMENT (IOC) LEFT DIABETIC 4.13 01:00.9;  Surgeon: Leandrew Koyanagi, MD;  Location: [facility];  Service: Ophthalmology;  Laterality: Left;   COLONOSCOPY     JOINT REPLACEMENT Bilateral    knee   PARS PLANA VITRECTOMY Right 05/21/2015   Procedure: PARS PLANA VITRECTOMY WITH 25 GAUGE;  Surgeon: Milus Height, MD;  Location: [facility];  Service: Ophthalmology;  Laterality: Right;  Lot # [id number] Endo laser: Watts: 300 Pulse Duration: 200 Total Pulses:143    shoulder surgery      Medical History: Past Medical History:  Diagnosis Date   Arthritis    Asthma    Bronchitis    CHF (congestive heart failure) (HCC)    Chronic kidney disease     decreased kidney function   Diabetes mellitus without complication (HCC)    Gout    Heart murmur    Hypertension    Multiple allergies    Shortness of breath dyspnea    w/ exertion   Sleep apnea    CPAP   Swelling of both lower extremities    Wheezing     Family History: Family History  Problem Relation Age of Onset   Breast cancer Cousin    Breast cancer Other    Hypertension Mother    Diabetes Mother    Hypertension Father    Diabetes Father    Alcohol abuse Father    Aneurysm Sister    Alcohol abuse Sister    Stroke Sister  died from stroke   Hepatitis Brother    Hypertension Sister        dialysis   Diabetes Sister    Alcoholism Brother    Alcoholism Brother        dies from head injury from a fall.  not an alcoholic,   Cancer Brother    Diabetes Brother    Hypertension Brother    Diabetes Brother    Cancer Brother    Hypertension Brother    Stroke Brother    Alcoholism Brother     Social History: Social History   Socioeconomic History   Marital status: Divorced    Spouse name: Not on file   Number of children: Not on file   Years of education: Not on file   Highest education level: Not on file  Occupational History   Not on file  Tobacco Use   Smoking status: Former    Types: Cigarettes   Smokeless tobacco: Never   Tobacco comments:    4 cigarettes quit 20 years ago  Scientific laboratory technician Use: Never used  Substance and Sexual Activity   Alcohol use: No   Drug use: Never   Sexual activity: Not on file  Other Topics Concern   Not on file  Social History Narrative   Not on file   Social Determinants of Health   Financial Resource Strain: Low Risk    Difficulty of Paying Living Expenses: Not very hard  Food Insecurity: Not on file  Transportation Needs: Not on file  Physical Activity: Not on file  Stress: Not on file  Social Connections: Not on file  Intimate Partner Violence: Not on file    Vital Signs: Blood pressure (!)  154/68, pulse 73, temperature 98.3 F (36.8 C), resp. rate 16, height _0  (1.6 m), weight 226 lb 9.6 oz (102.8 kg), SpO2 97 %.  Examination: General Appearance: The patient is well-developed, well-nourished, and in no distress. Skin: Gross inspection of skin unremarkable. Head: normocephalic, no gross deformities. Eyes: no gross deformities noted. ENT: ears appear grossly normal no exudates. Neck: Supple. No thyromegaly. No LAD. Respiratory: few rhonchi noted. Cardiovascular: Normal S1 and S2 without murmur or rub. Extremities: No cyanosis. pulses are equal. Neurologic: Alert and oriented. No involuntary movements.  LABS: Recent Results (from the past 2160 hour(s))  Vitamin B12     Status: None   Collection Time: 12/03/20  9:39 AM  Result Value Ref Range   Vitamin B-12 616 180 - 914 pg/mL    Comment: (NOTE) This assay is not validated for testing neonatal or myeloproliferative syndrome specimens for Vitamin B12 levels. Performed at [facility], [place] [address], [place], [place] [address]   Comprehensive metabolic panel     Status: Abnormal   Collection Time: 12/03/20  9:39 AM  Result Value Ref Range   Sodium 140 135 - 145 mmol/L   Potassium 3.8 3.5 - 5.1 mmol/L   Chloride 104 98 - 111 mmol/L   CO2 27 22 - 32 mmol/L   Glucose, Bld 141 (H) 70 - 99 mg/dL    Comment: Glucose reference range applies only to samples taken after fasting for at least 8 hours.   BUN 23 8 - 23 mg/dL   Creatinine, Ser 1.61 (H) 0.44 - 1.00 mg/dL   Calcium 9.2 8.9 - 10.3 mg/dL   Total Protein 6.5 6.5 - 8.1 g/dL   Albumin 3.8 3.5 - 5.0 g/dL   AST 14 (L) 15 -  41 U/L   ALT 12 0 - 44 U/L   Alkaline Phosphatase 54 38 - 126 U/L   Total Bilirubin 0.5 0.3 - 1.2 mg/dL    Comment: 0 0    GFR, Estimated 33 (L) >60 mL/min    Comment: (NOTE) Calculated using the CKD-EPI Creatinine Equation (2021)    Anion gap 9 5 - 15    Comment: Performed at [facility], [address]  [place], [place] [address]  CBC with Differential     Status: Abnormal   Collection Time: 12/03/20  9:39 AM  Result Value Ref Range   WBC 5.7 4.0 - 10.5 K/uL   RBC 4.07 3.87 - 5.11 MIL/uL   Hemoglobin 11.1 (L) 12.0 - 15.0 g/dL   HCT 35.2 (L) 36.0 - 46.0 %   MCV 86.5 80.0 - 100.0 fL   MCH 27.3 26.0 - 34.0 pg   MCHC 31.5 30.0 - 36.0 g/dL   RDW 13.9 11.5 - 15.5 %   Platelets 151 150 - 400 K/uL   nRBC 0.0 0.0 - 0.2 %   Neutrophils Relative % 66 %   Neutro Abs 3.8 1.7 - 7.7 K/uL   Lymphocytes Relative 20 %   Lymphs Abs 1.1 0.7 - 4.0 K/uL   Monocytes Relative 10 %   Monocytes Absolute 0.6 0.1 - 1.0 K/uL   Eosinophils Relative 3 %   Eosinophils Absolute 0.2 0.0 - 0.5 K/uL   Basophils Relative 0 %   Basophils Absolute 0.0 0.0 - 0.1 K/uL   Immature Granulocytes 1 %   Abs Immature Granulocytes 0.03 0.00 - 0.07 K/uL    Comment: Performed at [facility], [address], [place], [place] [address]  Iron and TIBC     Status: None   Collection Time: 12/03/20  9:41 AM  Result Value Ref Range   Iron 44 28 - 170 ug/dL   TIBC 305 250 - 450 ug/dL   Saturation Ratios 14 10.4 - 31.8 %   UIBC 261 ug/dL    Comment: Performed at [facility], [address] [place], [place] [address]  Ferritin     Status: None   Collection Time: 12/03/20  9:41 AM  Result Value Ref Range   Ferritin 38 11 - 307 ng/mL    Comment: Performed at [facility], [address] [place], [place] [address]  CBC with Differential/Platelet     Status: Abnormal   Collection Time: 12/03/20 10:00 AM  Result Value Ref Range   WBC 6.3 3.4 - 10.8 x10E3/uL   RBC 4.02 3.77 - 5.28 x10E6/uL   Hemoglobin 11.0 (L) 11.1 - 15.9 g/dL   Hematocrit 33.8 (L) 34.0 - 46.6 %   MCV 84 79 - 97 fL   MCH 27.4 26.6 - 33.0 pg   MCHC 32.5 31.5 - 35.7 g/dL   RDW 13.3 11.7 - 15.4 %   Platelets 146 (L) 150 - 450 x10E3/uL   Neutrophils 68 Not Estab. %   Lymphs 19 Not Estab. %   Monocytes 9 Not Estab. %   Eos 3 Not Estab. %    Basos 0 Not Estab. %   Neutrophils Absolute 4.3 1.4 - 7.0 x10E3/uL   Lymphocytes Absolute 1.2 0.7 - 3.1 x10E3/uL   Monocytes Absolute 0.6 0.1 - 0.9 x10E3/uL   EOS (ABSOLUTE) 0.2 0.0 - 0.4 x10E3/uL   Basophils Absolute 0.0 0.0 - 0.2 x10E3/uL   Immature Granulocytes 1 Not Estab. %   Immature Grans (  Abs) 0.0 0.0 - 0.1 x10E3/uL  Pro b natriuretic peptide (BNP)[id number])     Status: Abnormal   Collection Time: 12/03/20 10:00 AM  Result Value Ref Range   NT-Pro BNP 1,161 (H) 0 - 738 pg/mL    Comment: The following cut-points have been suggested for the use of proBNP for the diagnostic evaluation of heart failure (HF) in patients with acute dyspnea: Modality                     Age           Optimal Cut                            (years)            Point ------------------------------------------------------ Diagnosis (rule in HF)        <50            450 pg/mL                           50 - 75            900 pg/mL                               >75           1800 pg/mL Exclusion (rule out HF)  Age independent     300 pg/mL   C-reactive protein     Status: None   Collection Time: 12/03/20 10:00 AM  Result Value Ref Range   CRP 5 0 - 10 mg/L  Sed Rate (ESR)     Status: None   Collection Time: 12/03/20 10:00 AM  Result Value Ref Range   Sed Rate 28 0 - 40 mm/hr  Bladder Scan (Post Void Residual) in office     Status: None   Collection Time: 12/09/20  3:14 PM  Result Value Ref Range   Scan Result 0   POCT HgB A1C     Status: Abnormal   Collection Time: 12/17/20 12:03 PM  Result Value Ref Range   Hemoglobin A1C 5.9 (A) 4.0 - 5.6 %   HbA1c POC (<> result, manual entry)     HbA1c, POC (prediabetic range)     HbA1c, POC (controlled diabetic range)    Glucose, capillary     Status: None   Collection Time: 12/31/20  6:40 AM  Result Value Ref Range   Glucose-Capillary 92 70 - 99 mg/dL    Comment: Glucose reference range applies only to samples taken after  fasting for at least 8 hours.  Glucose, capillary     Status: Abnormal   Collection Time: 12/31/20  8:11 AM  Result Value Ref Range   Glucose-Capillary 103 (H) 70 - 99 mg/dL    Comment: Glucose reference range applies only to samples taken after fasting for at least 8 hours.    Radiology: MM 3D SCREEN BREAST BILATERAL  Result Date: 02/23/2021 CLINICAL DATA:  Screening. EXAM: DIGITAL SCREENING BILATERAL MAMMOGRAM WITH TOMOSYNTHESIS AND CAD TECHNIQUE: Bilateral screening digital craniocaudal and mediolateral oblique mammograms were obtained. Bilateral screening digital breast tomosynthesis was performed. The images were evaluated with computer-aided detection. COMPARISON:  Previous exam(s). ACR Breast Density Category b: There are scattered areas of fibroglandular density. FINDINGS: In the right breast calcifications require further evaluation. In the  left breast skin thickening and trabecular thickening in the anterior breast requires further evaluation. IMPRESSION: Further evaluation is suggested for possible calcifications in the right breast. Further evaluation is suggested for possible skin thickening and trabecular thickening in the left breast. RECOMMENDATION: Diagnostic mammogram and possibly ultrasound of both breasts. (Code:FI-B-20M) The patient will be contacted regarding the findings, and additional imaging will be scheduled. BI-RADS CATEGORY  0: Incomplete. Need additional imaging evaluation and/or prior mammograms for comparison. Electronically Signed   By: Dorise Bullion III M.D.   On: 02/23/2021 16:39   MM 3D SCREEN BREAST BILATERAL  Result Date: 02/23/2021 CLINICAL DATA:  Screening. EXAM: DIGITAL SCREENING BILATERAL MAMMOGRAM WITH TOMOSYNTHESIS AND CAD TECHNIQUE: Bilateral screening digital craniocaudal and mediolateral oblique mammograms were obtained. Bilateral screening digital breast tomosynthesis was performed. The images were evaluated with computer-aided detection. COMPARISON:   Previous exam(s). ACR Breast Density Category b: There are scattered areas of fibroglandular density. FINDINGS: In the right breast calcifications require further evaluation. In the left breast skin thickening and trabecular thickening in the anterior breast requires further evaluation. IMPRESSION: Further evaluation is suggested for possible calcifications in the right breast. Further evaluation is suggested for possible skin thickening and trabecular thickening in the left breast. RECOMMENDATION: Diagnostic mammogram and possibly ultrasound of both breasts. (Code:FI-B-20M) The patient will be contacted regarding the findings, and additional imaging will be scheduled. BI-RADS CATEGORY  0: Incomplete. Need additional imaging evaluation and/or prior mammograms for comparison. Electronically Signed   By: Dorise Bullion III M.D.   On: 02/23/2021 16:39   MM 3D SCREEN BREAST BILATERAL  Result Date: 02/23/2021 CLINICAL DATA:  Screening. EXAM: DIGITAL SCREENING BILATERAL MAMMOGRAM WITH TOMOSYNTHESIS AND CAD TECHNIQUE: Bilateral screening digital craniocaudal and mediolateral oblique mammograms were obtained. Bilateral screening digital breast tomosynthesis was performed. The images were evaluated with computer-aided detection. COMPARISON:  Previous exam(s). ACR Breast Density Category b: There are scattered areas of fibroglandular density. FINDINGS: In the right breast calcifications require further evaluation. In the left breast skin thickening and trabecular thickening in the anterior breast requires further evaluation. IMPRESSION: Further evaluation is suggested for possible calcifications in the right breast. Further evaluation is suggested for possible skin thickening and trabecular thickening in the left breast. RECOMMENDATION: Diagnostic mammogram and possibly ultrasound of both breasts. (Code:FI-B-20M) The patient will be contacted regarding the findings, and additional imaging will be scheduled. BI-RADS  CATEGORY  0: Incomplete. Need additional imaging evaluation and/or prior mammograms for comparison. Electronically Signed   By: Dorise Bullion III M.D.   On: 02/23/2021 16:39     Assessment and Plan: Patient Active Problem List   Diagnosis Date Noted   B12 deficiency 03/19/2020   Anemia in stage 3b chronic kidney disease ([place]) 03/19/2020   Acute bilateral low back pain with right-sided sciatica 11/12/2018   Closed fracture of right proximal humerus 11/12/2018   Type 2 diabetes mellitus with hyperglycemia ([place]) 10/28/2018   Flu vaccine need 10/28/2018   Pain due to onychomycosis of toenails of both feet 07/17/2018   Type 2 diabetes mellitus with vascular disease ([place]) 07/17/2018   Other fatigue 11/14/2017   Moderate episode of recurrent major depressive disorder ([place]) 11/14/2017   Chronic allergic rhinitis 11/14/2017   Primary insomnia 11/14/2017   Uncontrolled type 2 diabetes mellitus with hyperglycemia ([place]) 09/18/2017   Acute pain of right shoulder 09/18/2017   Type 2 diabetes mellitus with chronic kidney disease ([place]) 01/19/2017   Anosmia 01/19/2017   Cervicalgia 01/19/2017   Vitamin D deficiency 01/19/2017  Hypoglycemia 01/19/2017   OSA (obstructive sleep apnea) 01/19/2017   Essential hypertension, benign 01/19/2017   Mixed hyperlipidemia 01/19/2017   Anemia, unspecified 01/19/2017   Edema 01/19/2017   SOB (shortness of breath) 01/19/2017   Gout 01/19/2017   Morbid obesity ([place]) 01/19/2017   Stage 3 chronic kidney disease ([place]) 01/19/2017   Hypercalcemia 01/19/2017   Iron deficiency anemia 01/14/2016   Acute pain of right knee 04/23/2015   Left wrist pain 08/12/2014   Status post bilateral unicompartmental knee replacement 11/13/2013   Arthritis, senescent 08/02/2013   1. SOB (shortness of breath) Did spirometry I think it is multifactorial because of underlying obesity as well as obstructive sleep apnea and in addition likely has some cardiac issues going on which  may be secondary to the obstructive sleep apnea. - Spirometry with Graph  2. OSA (obstructive sleep apnea) Counseling provided on importance of using CPAP we will try to see about getting her set up with CPAP titration and also CPAP machine - Cpap titration; Future  3. Obesity, morbid ([place]) Obesity Counseling: Had a lengthy discussion regarding patients BMI and weight issues. Patient was instructed on portion control as well as increased activity. Also discussed caloric restrictions with trying to maintain intake less than 2000 Kcal. Discussions were made in accordance with the 5As of weight management. Simple actions such as not eating late and if able to, taking a walk is suggested.   4. Stage 3 chronic kidney disease, unspecified whether stage 3a or 3b CKD ([place]) She will need to follow-up with her primary team and follow the labs monitor fluid status closely   General Counseling: I have discussed the findings of the evaluation and examination with Pamala Hurry.  I have also discussed any further diagnostic evaluation thatmay be needed or ordered today. [place] verbalizes understanding of the findings of todays visit. We also reviewed her medications today and discussed drug interactions and side effects including but not limited excessive drowsiness and altered mental states. We also discussed that there is always a risk not just to her but also people around her. she has been encouraged to call the office with any questions or concerns that should arise related to todays visit.  Orders Placed This Encounter  Procedures   Spirometry with Graph    Order Specific Question:   Where should this test be performed?    Answer:   [facility]    Order Specific Question:   Basic spirometry    Answer:   Yes     Time spent: 68  I have personally obtained a history, examined the patient, evaluated laboratory and imaging results, formulated the assessment and plan and placed orders.     Allyne Gee, MD [facility] Pulmonary and Critical Care Sleep medicine

## 2021-03-03 DIAGNOSIS — E210 Primary hyperparathyroidism: Secondary | ICD-10-CM | POA: Diagnosis not present

## 2021-03-03 DIAGNOSIS — E1122 Type 2 diabetes mellitus with diabetic chronic kidney disease: Secondary | ICD-10-CM | POA: Diagnosis not present

## 2021-03-03 DIAGNOSIS — I10 Essential (primary) hypertension: Secondary | ICD-10-CM | POA: Diagnosis not present

## 2021-03-03 DIAGNOSIS — D631 Anemia in chronic kidney disease: Secondary | ICD-10-CM | POA: Diagnosis not present

## 2021-03-03 DIAGNOSIS — N184 Chronic kidney disease, stage 4 (severe): Secondary | ICD-10-CM | POA: Diagnosis not present

## 2021-03-04 ENCOUNTER — Telehealth: Payer: HMO

## 2021-03-10 ENCOUNTER — Ambulatory Visit
Admission: RE | Admit: 2021-03-10 | Discharge: 2021-03-10 | Disposition: A | Discharge status: Home / Self Care | Payer: HMO | Source: Ambulatory Visit | Attending: Internal Medicine | Admitting: Internal Medicine

## 2021-03-10 ENCOUNTER — Other Ambulatory Visit: Payer: Self-pay

## 2021-03-10 ENCOUNTER — Encounter: Payer: Self-pay | Admitting: Oncology

## 2021-03-10 DIAGNOSIS — R922 Inconclusive mammogram: Secondary | ICD-10-CM | POA: Diagnosis not present

## 2021-03-10 DIAGNOSIS — R928 Other abnormal and inconclusive findings on diagnostic imaging of breast: Secondary | ICD-10-CM

## 2021-03-10 DIAGNOSIS — N6489 Other specified disorders of breast: Secondary | ICD-10-CM | POA: Diagnosis not present

## 2021-03-10 DIAGNOSIS — R921 Mammographic calcification found on diagnostic imaging of breast: Secondary | ICD-10-CM

## 2021-03-18 ENCOUNTER — Other Ambulatory Visit: Payer: Self-pay

## 2021-03-18 ENCOUNTER — Ambulatory Visit: Payer: HMO | Admitting: Student-PharmD

## 2021-03-18 DIAGNOSIS — E1165 Type 2 diabetes mellitus with hyperglycemia: Secondary | ICD-10-CM

## 2021-03-18 DIAGNOSIS — I10 Essential (primary) hypertension: Secondary | ICD-10-CM

## 2021-03-18 DIAGNOSIS — G4762 Sleep related leg cramps: Secondary | ICD-10-CM

## 2021-03-18 DIAGNOSIS — L282 Other prurigo: Secondary | ICD-10-CM

## 2021-03-18 DIAGNOSIS — J209 Acute bronchitis, unspecified: Secondary | ICD-10-CM

## 2021-03-18 DIAGNOSIS — N1832 Chronic kidney disease, stage 3b: Secondary | ICD-10-CM

## 2021-03-18 DIAGNOSIS — J45909 Unspecified asthma, uncomplicated: Secondary | ICD-10-CM

## 2021-03-18 DIAGNOSIS — J309 Allergic rhinitis, unspecified: Secondary | ICD-10-CM

## 2021-03-18 MED ORDER — VITAMIN D (ERGOCALCIFEROL) 1.25 MG (50000 UNIT) PO CAPS: ORAL_CAPSULE | ORAL | 1 refills | Status: DC

## 2021-03-18 MED ORDER — CITALOPRAM HYDROBROMIDE 20 MG PO TABS: ORAL_TABLET | ORAL | 1 refills | Status: DC

## 2021-03-18 MED ORDER — FUROSEMIDE 40 MG PO TABS: ORAL_TABLET | ORAL | 1 refills | Status: DC

## 2021-03-18 MED ORDER — ONETOUCH VERIO VI STRP
ORAL_STRIP | 1 refills | Status: DC
Start: 2021-03-18 — End: 2022-04-08

## 2021-03-18 MED ORDER — FOLIC ACID 1 MG PO TABS: 1.0000 mg | ORAL_TABLET | Freq: Every day | ORAL | 1 refills | Status: DC

## 2021-03-18 MED ORDER — MONTELUKAST SODIUM 10 MG PO TABS: 10.0000 mg | ORAL_TABLET | Freq: Every day | ORAL | 3 refills | Status: DC

## 2021-03-18 MED ORDER — KETOCONAZOLE 2 % EX SHAM: MEDICATED_SHAMPOO | CUTANEOUS | 3 refills | Status: AC

## 2021-03-18 MED ORDER — ALLOPURINOL 300 MG PO TABS: 300.0000 mg | ORAL_TABLET | Freq: Every day | ORAL | 1 refills | Status: DC

## 2021-03-18 MED ORDER — ATORVASTATIN CALCIUM 10 MG PO TABS: 10.0000 mg | ORAL_TABLET | Freq: Every day | ORAL | 1 refills | Status: DC

## 2021-03-18 MED ORDER — GLIMEPIRIDE 1 MG PO TABS: 1.0000 mg | ORAL_TABLET | Freq: Every day | ORAL | 1 refills | Status: DC

## 2021-03-18 MED ORDER — FLUTICASONE-SALMETEROL 100-50 MCG/ACT IN AEPB: 1.0000 | INHALATION_SPRAY | Freq: Two times a day (BID) | RESPIRATORY_TRACT | 3 refills | Status: DC

## 2021-03-18 MED ORDER — TIZANIDINE HCL 2 MG PO CAPS: ORAL_CAPSULE | ORAL | 1 refills | Status: DC

## 2021-03-18 MED ORDER — LOSARTAN POTASSIUM 25 MG PO TABS: ORAL_TABLET | ORAL | 1 refills | Status: DC

## 2021-03-18 MED ORDER — VICTOZA 18 MG/3ML ~~LOC~~ SOPN: PEN_INJECTOR | SUBCUTANEOUS | 3 refills | Status: DC

## 2021-03-18 MED ORDER — BETAMETHASONE DIPROPIONATE 0.05 % EX CREA: TOPICAL_CREAM | Freq: Two times a day (BID) | CUTANEOUS | 1 refills | Status: DC

## 2021-03-18 MED ORDER — BISOPROLOL-HYDROCHLOROTHIAZIDE 5-6.25 MG PO TABS: 1.0000 | ORAL_TABLET | Freq: Every day | ORAL | 1 refills | Status: DC

## 2021-03-18 MED ORDER — MELOXICAM 7.5 MG PO TABS: ORAL_TABLET | ORAL | 1 refills | Status: DC

## 2021-03-18 MED ORDER — HYDRALAZINE HCL 25 MG PO TABS
ORAL_TABLET | ORAL | 1 refills | Status: DC
Start: 2021-03-18 — End: 2021-07-15

## 2021-03-18 MED ORDER — ONETOUCH DELICA LANCETS 33G MISC: 1 refills | Status: AC

## 2021-03-18 MED ORDER — PEN NEEDLES 32G X 4 MM MISC: 1.0000 [IU] | Freq: Every day | 5 refills | Status: DC

## 2021-03-18 NOTE — Progress Notes (Signed)
Follow Up Pharmacist Visit  Frances Greene, Frances Greene [id number] 5 years, Female  DOB: 02-11-1945  M: [phone]  Clinical Summary Patient Risk: High Next CCM Follow Up: 07/08/21 Next AWV: 08/25/21 Next PCP Visit: 06/17/21 Summary for PCP: Patient presents via telephone with her medications. Does not have a BP device so is unable to check at home but denies any symptoms of low/high BP. Endorses adherence to medications and agrees to enroll in pill packaging program for med assitance.  Patient Chart Prep Completed by Charlann Lange on 03/02/2021  Chronic Conditions Patient's Chronic Conditions: Hypertension (HTN), Diabetes (DM), Allergic rhinitis, Chronic Kidney Disease (CKD), Hyperlipidemia/Dyslipidemia (HLD), Insomnia  Doctor and Hospital Visits Were there PCP Visits since last visit with the Pharmacist?: Yes Visit #1: 02/24/21 Allyne Gee, MD. For shortness of breath No medication changes. Were there Specialist Visits since last visit with the Pharmacist?: No Was there a Hospital Visit in last 30 days?: No Were there other Hospital Visits since last visit with the Pharmacist?: No  Medication Information Have there been any medication changes from PCP or Specialist since last visit with the Pharmacist?: No Are there any Medication adherence gaps (beyond 5 days past due)?: Yes Details: Victoza 18 MG/3ML SOPN 10/25/20 90 DS Medication adherence rates for the STAR rating drugs: Atorvastatin 10 mg 02/02/21 90 DS Losartan 25 mg 01/13/21 90 DS Victoza 18 MG/3ML SOPN 10/25/20 90 DS List Patient's current Care Gaps: No current Care Gaps identified  Pre-Call Questions  Completed by Charlann Lange on 03/18/2021 Are you able to connect with Patient: Yes Confirmed appointment date/time with patient/caregiver?: Yes Date/time of the appointment: 03/18/21 at 65: 00 AM Visit type: Phone Patient/Caregiver instructed to bring medications to appointment: Yes What, if any, problems do you have  getting your medications from the pharmacy?: None What is your top health concern to discuss at your upcoming visit?: Patient stated none. Have you seen any other providers since your last visit?: No  Disease Assessments  Subjective Information Current BP: 154/68 Current HR: 73 taken on: 02/24/2021 Weight: 226 BMI: 40.14 Last GFR: 33 taken on: 12/03/2020 Why did the patient present?: CCM F/U Visit Is Patient using [facility]?: No Name and location of Current pharmacy: [facility] Current Rx insurance plan: HTA Are meds synced by current pharmacy?: No Are meds delivered by current pharmacy?: No - delivery available but patient prefers to not use Would patient benefit from direct intervention of clinical lead in dispensing process to optimize clinical outcomes?: Yes Are [facility] pharmacy services available where patient lives?: Yes Is patient disadvantaged to use [facility]?: No [facility] services reviewed with patient and patient wishes to change pharmacy?: Yes - patient provided verbal consent to transfer prescriptions to [facility].  Patient made aware that CCM team is committed to working with any pharmacy and patient is free to select the pharmacy of their choice that best meets their needs.  Patient also made aware they may discontinue use of [facility] at any time if they feel the service is not meeting their personal needs. Reasons patient wishes to change to [facility]: Synchronization, Packaging, Delivery of medication Dispensing Preference: Packaging 30 DS Any additional demeanor/mood notes?: Patient presents via telephone and is very pleasant to speak with.  Hypertension (HTN) Assess this condition today?: Yes Is patient able to obtain BP reading today?: No Goal: <130/80 mmHG Hypertension Stage: Stage 2 (SBP >140 or DBP > 90) Is Patient checking BP at home?: No How often does patient miss taking their  blood pressure medications?:  none Has patient experienced hypotension, dizziness, falls or bradycardia?: No Check present secondary causes (below) for HTN: CKD, Obesity Does Patient use RPM device?: No BP RPM device: Does patient qualify?: No We discussed: DASH diet:  following a diet emphasizing fruits and vegetables and low-fat dairy products along with whole grains, fish, poultry, and nuts. Reducing red meats and sugars., Targeting 150 minutes of aerobic activity per week Assessment:: Uncontrolled Drug: Hydralazine 25mg  1 tab three times daily Assessment: Appropriate, Effective, Safe, Accessible Drug: Losartan 25mg  1/2 tab daily Assessment: Appropriate, Effective, Safe, Accessible Drug: Bisoprolol-HCTZ 5-6.25mg  1 tab daily Assessment: Appropriate, Effective, Safe, Accessible Drug: Furosemide 40mg  1 tab daily with additional tab as needed Assessment: Appropriate, Effective, Safe, Accessible Additional Info: Patient does not have a device at home to check BP but denies any symptoms of low or high BP. Plan to: Continue medication therapy HC Follow up: 2 months Pharmacist Follow up: 3 months  Hyperlipidemia/Dyslipidemia (HLD) Last Lipid panel on: 08/23/2019 TC (Goal<200): 129 LDL: 59 HDL (Goal>40): 54 TG (Goal<150): 84 ASCVD 10-year risk?is:: High (>20%) ASCVD Risk Score: 30.1 % Assess this condition today?: Yes LDL Goal: <70 Has patient tried and failed any HLD Medications?: No Check present secondary causes (below) that can lead to increased cholesterol levels (multi-choice optional): Beta blockers, Thiazide diuretics, Diabetes, CKD We discussed: How to reduce cholesterol through diet/weight management and physical activity. Assessment:: Controlled Drug: Atorvastatin 10mg  1 tab daily Assessment: Appropriate, Effective, Safe, Accessible Plan to: Continue medication therapy HC Follow up: 2 months Pharmacist Follow up: 3 months  Exercise, Diet and Non-Drug Coordination Needs Additional exercise counseling  points. We discussed: targeting at least 151 minutes per week of moderate-intensity aerobic exercise. Additional diet counseling points. We discussed: key components of the DASH diet Discussed Non-Drug Care Coordination Needs: Yes Does Patient have Medication financial barriers?: No  Accountable Health Communities Health-Related Social Needs Screening Tool -  SDOH  What is your living situation today? (ref #1): I have a steady place to live Think about the place you live. Do you have problems with any of the following? (ref #2): None of the above Within the past 12 months, you worried that your food would run out before you got money to buy more (ref #3): Never true Within the past 12 months, the food you bought just didn't last and you didn't have money to get more (ref #4): Never true In the past 12 months, has lack of reliable transportation kept you from medical appointments, meetings, work or from getting things needed for daily living? (ref #5): No In the past 12 months, has the electric, gas, oil, or water company threatened to shut off services in your home? (ref #6): No How often does anyone, including family and friends, physically hurt you? (ref #7): Never (1) How often does anyone, including family and friends, insult or talk down to you? (ref #8): Never (1) How often does anyone, including friends and family, threaten you with harm? (ref #9): Never (1) How often does anyone, including family and friends, scream or curse at you? (ref #10): Never (1)  Heart-Healthy Eating Plan Heart-healthy meal planning includes: Eating less unhealthy fats. Eating more healthy fats. Making other changes in your diet. Talk with your doctor or a diet specialist (dietitian) to create an eating plan that is right for you. What is my plan? Your doctor may recommend an eating plan that includes: Total fat: ______% or less of total calories a day. Saturated fat:  ______% or less of total calories a  day. Cholesterol: less than _________mg a day. What are tips for following this plan? Cooking Avoid frying your food. Try to bake, boil, grill, or broil it instead. You can also reduce fat by: Removing the skin from poultry. Removing all visible fats from meats. Steaming vegetables in water or broth. Meal planning A plate with examples of foods in a healthy diet.  At meals, divide your plate into four equal parts: Fill one-half of your plate with vegetables and green salads. Fill one-fourth of your plate with whole grains. Fill one-fourth of your plate with lean protein foods. Eat 4-5 servings of vegetables per day. A serving of vegetables is: 1 cup of raw or cooked vegetables. 2 cups of raw leafy greens. Eat 4-5 servings of fruit per day. A serving of fruit is: 1 medium whole fruit.  cup of dried fruit.  cup of fresh, frozen, or canned fruit.  cup of 100% fruit juice. Eat more foods that have soluble fiber. These are apples, broccoli, carrots, beans, peas, and barley. Try to get 20-30 g of fiber per day. Eat 4-5 servings of nuts, legumes, and seeds per week: 1 serving of dried beans or legumes equals  cup after being cooked. 1 serving of nuts is  cup. 1 serving of seeds equals 1 tablespoon. General information Eat more home-cooked food. Eat less restaurant, buffet, and fast food. Limit or avoid alcohol. Limit foods that are high in starch and sugar. Avoid fried foods. Lose weight if you are overweight. Keep track of how much salt (sodium) you eat. This is important if you have high blood pressure. Ask your doctor to tell you more about this. Try to add vegetarian meals each week. Fats Choose healthy fats. These include olive oil and canola oil, flaxseeds, walnuts, almonds, and seeds. Eat more omega-3 fats. These include salmon, mackerel, sardines, tuna, flaxseed oil, and ground flaxseeds. Try to eat fish at least 2 times each week. Check food labels. Avoid foods with  trans fats or high amounts of saturated fat. Limit saturated fats. These are often found in animal products, such as meats, butter, and cream. These are also found in plant foods, such as palm oil, palm kernel oil, and coconut oil. Avoid foods with partially hydrogenated oils in them. These have trans fats. Examples are stick margarine, some tub margarines, cookies, crackers, and other baked goods. What foods can I eat? Fruits All fresh, canned (in natural juice), or frozen fruits. Vegetables Fresh or frozen vegetables (raw, steamed, roasted, or grilled). Green salads. Grains Most grains. Choose whole wheat and whole grains most of the time. Rice and pasta, including brown rice and pastas made with whole wheat. Meats and other proteins Lean, well-trimmed beef, veal, pork, and lamb. Chicken and [place] without skin. All fish and shellfish. Wild duck, rabbit, pheasant, and venison. Egg whites or low-cholesterol egg substitutes. Dried beans, peas, lentils, and tofu. Seeds and most nuts. Dairy Low-fat or nonfat cheeses, including ricotta and mozzarella. Skim or 1% milk that is liquid, powdered, or evaporated. Buttermilk that is made with low-fat milk. Nonfat or low-fat yogurt. Fats and oils Non-hydrogenated (trans-free) margarines. Vegetable oils, including soybean, sesame, sunflower, olive, peanut, safflower, corn, canola, and cottonseed. Salad dressings or mayonnaise made with a vegetable oil. Beverages Mineral water. Coffee and tea. Diet carbonated beverages. Sweets and desserts Sherbet, gelatin, and fruit ice. Small amounts of dark chocolate. Limit all sweets and desserts. Seasonings and condiments All seasonings and condiments. The  items listed above may not be a complete list of foods and drinks you can eat. Contact a dietitian for more options. What foods should I avoid? Fruits Canned fruit in heavy syrup. Fruit in cream or butter sauce. Fried fruit. Limit  coconut. Vegetables Vegetables cooked in cheese, cream, or butter sauce. Fried vegetables. Grains Breads that are made with saturated or trans fats, oils, or whole milk. Croissants. Sweet rolls. Donuts. High-fat crackers, such as cheese crackers. Meats and other proteins Fatty meats, such as hot dogs, ribs, sausage, bacon, rib-eye roast or steak. High-fat deli meats, such as salami and bologna. Caviar. Domestic duck and goose. Organ meats, such as liver. Dairy Cream, sour cream, cream cheese, and creamed cottage cheese. Whole-milk cheeses. Whole or 2% milk that is liquid, evaporated, or condensed. Whole buttermilk. Cream sauce or high-fat cheese sauce. Yogurt that is made from whole milk. Fats and oils Meat fat, or shortening. Cocoa butter, hydrogenated oils, palm oil, coconut oil, palm kernel oil. Solid fats and shortenings, including bacon fat, salt pork, lard, and butter. Nondairy cream substitutes. Salad dressings with cheese or sour cream. Beverages Regular sodas and juice drinks with added sugar. Sweets and desserts Frosting. Pudding. Cookies. Cakes. Pies. Milk chocolate or white chocolate. Buttered syrups. Full-fat ice cream or ice cream drinks. The items listed above may not be a complete list of foods and drinks to avoid. Contact a dietitian for more information. Summary Heart-healthy meal planning includes eating less unhealthy fats, eating more healthy fats, and making other changes in your diet. Eat a balanced diet. This includes fruits and vegetables, low-fat or nonfat dairy, lean protein, nuts and legumes, whole grains, and heart-healthy oils and fats. This information is not intended to replace advice given to you by your health care provider. Make sure you discuss any questions you have with your health care provider.       Engagement Notes HC Chart Review: 15 min  03/02/21  HC Assessment call time spent: 7 min 02/18/21 CP Chart Prep: 15 min 03/18/21 CP Office Visit: 32 min  03/18/21 CP Office Visit Documentation: 45 min 03/18/21  Alena Bills Clinical Pharmacist [phone]

## 2021-03-20 ENCOUNTER — Telehealth: Payer: Self-pay | Admitting: Student-PharmD

## 2021-03-20 NOTE — Progress Notes (Signed)
?Chronic Care Management ?Pharmacy Assistant  ? ?Name: Frances Greene  MRN: [id number] DOB: [birth year] ? ? ?Reason for Encounter: Care Plan and Patient Handout ?  ? ?Medications: ?Outpatient Encounter Medications as of 03/20/2021  ?Medication Sig  ? acetaminophen (TYLENOL) 325 MG tablet Take 650 mg by mouth every 6 (six) hours as needed.  ? allopurinol (ZYLOPRIM) 300 MG tablet Take 1 tablet (300 mg total) by mouth daily.  ? AMBULATORY NON FORMULARY MEDICATION Medication Name: geni kot natural veggie laxitive  ? aspirin EC 325 MG tablet Take 325 mg by mouth.  ? atorvastatin (LIPITOR) 10 MG tablet Take 1 tablet (10 mg total) by mouth daily.  ? betamethasone dipropionate 0.05 % cream Apply topically 2 (two) times daily.  ? bisoprolol-hydrochlorothiazide (ZIAC) 5-6.25 MG tablet Take 1 tablet by mouth daily.  ? Blood Glucose Monitoring Suppl (ONETOUCH VERIO REFLECT) w/Device KIT Use as directed for once daily Dx E11.65  ? Cholecalciferol (VITAMIN D3) 1.25 MG (50000 UT) CAPS Take by mouth daily.  ? cinacalcet (SENSIPAR) 30 MG tablet   ? citalopram (CELEXA) 20 MG tablet TAKE 1 TABLET BY MOUTH ONCE DAILY FOR DEPRESSION  ? fluticasone-salmeterol (ADVAIR) 100-50 MCG/ACT AEPB Inhale 1 puff into the lungs 2 (two) times daily.  ? folic acid (FOLVITE) 1 MG tablet Take 1 tablet (1 mg total) by mouth daily.  ? furosemide (LASIX) 40 MG tablet TAKE 1 TABLET BY MOUTH ONCE DAILY AND  TAKE  XTRA  TAB  IF  NEEDED  ? Garlic 9518 MG CAPS Take by mouth.  ? glimepiride (AMARYL) 1 MG tablet Take 1 tablet (1 mg total) by mouth daily.  ? glucose blood (ONETOUCH VERIO) test strip Use as directed once a daily  ? hydrALAZINE (APRESOLINE) 25 MG tablet Take three times a day  ? HYDROcodone-acetaminophen (NORCO/VICODIN) 5-325 MG tablet Take 1 tablet by mouth every 6 (six) hours as needed for moderate pain or severe pain.  ? Insulin Pen Needle (PEN NEEDLES) 32G X 4 MM MISC 1 Units by Does not apply route daily. Use with victoza injections daily.  ?  ketoconazole (NIZORAL) 2 % shampoo SHAMPOO AS DIRECTED  ? latanoprost (XALATAN) 0.005 % ophthalmic solution latanoprost 0.005 % eye drops ? INSTILL 1 DROP INTO EACH EYE IN THE EVENING  ? liraglutide (VICTOZA) 18 MG/3ML SOPN INJECT 1.8 MG ONCE DAILY SUBCUTANEOUSLY  ? losartan (COZAAR) 25 MG tablet Patient takes 1/2 tab daily  ? meloxicam (MOBIC) 7.5 MG tablet TAKE 1 TABLET BY MOUTH ONCE DAILY FOR  GOUT  AND  ARTHRITIS  ? mirabegron ER (MYRBETRIQ) 50 MG TB24 tablet Take 1 tablet (50 mg total) by mouth daily.  ? montelukast (SINGULAIR) 10 MG tablet Take 1 tablet (10 mg total) by mouth daily.  ? Omega-3 Fatty Acids (FISH OIL PO) Take by mouth.  ? 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 senna (SENOKOT) 8.6 MG TABS tablet Take 1 tablet by mouth.  ? Sulfacetamide Sodium (SODIUM SULFACETAMIDE) 10 % SHAM Apply topically.  ? tizanidine (ZANAFLEX) 2 MG capsule Take one tab po qhs for spasm  ? traZODone (DESYREL) 50 MG tablet Take 0.5-1 tablets (25-50 mg total) by mouth at bedtime as needed for sleep.  ? Vitamin D, Ergocalciferol, (DRISDOL) 1.25 MG (50000 UNIT) CAPS capsule Take 1 cap once a week  ? ?No facility-administered encounter medications on file as of 03/20/2021.  ? ? ?Reviewed the patients visit reinsured it was completed per the pharmacist [facility] request.  Printed the CCM care plan. Mailed the patient CCM care plan and patient handout to their most recent address on file. ? ?Charlann Lange, RMA ?Healthcare Concierge  ?[phone] ? ?

## 2021-03-24 ENCOUNTER — Encounter: Payer: Self-pay | Admitting: Internal Medicine

## 2021-03-24 ENCOUNTER — Telehealth: Payer: Self-pay

## 2021-03-24 ENCOUNTER — Other Ambulatory Visit: Payer: Self-pay | Admitting: *Deleted

## 2021-03-24 ENCOUNTER — Ambulatory Visit (INDEPENDENT_AMBULATORY_CARE_PROVIDER_SITE_OTHER): Payer: HMO | Admitting: Internal Medicine

## 2021-03-24 ENCOUNTER — Other Ambulatory Visit: Payer: Self-pay

## 2021-03-24 VITALS — BP 150/66 | HR 93 | Temp 98.0°F | Resp 16 | Ht 63.0 in | Wt 223.4 lb

## 2021-03-24 DIAGNOSIS — N1832 Chronic kidney disease, stage 3b: Secondary | ICD-10-CM

## 2021-03-24 DIAGNOSIS — D631 Anemia in chronic kidney disease: Secondary | ICD-10-CM

## 2021-03-24 DIAGNOSIS — G4733 Obstructive sleep apnea (adult) (pediatric): Secondary | ICD-10-CM | POA: Diagnosis not present

## 2021-03-24 DIAGNOSIS — E1140 Type 2 diabetes mellitus with diabetic neuropathy, unspecified: Secondary | ICD-10-CM | POA: Diagnosis not present

## 2021-03-24 DIAGNOSIS — E1165 Type 2 diabetes mellitus with hyperglycemia: Secondary | ICD-10-CM

## 2021-03-24 DIAGNOSIS — D509 Iron deficiency anemia, unspecified: Secondary | ICD-10-CM

## 2021-03-24 DIAGNOSIS — R300 Dysuria: Secondary | ICD-10-CM

## 2021-03-24 DIAGNOSIS — R928 Other abnormal and inconclusive findings on diagnostic imaging of breast: Secondary | ICD-10-CM

## 2021-03-24 DIAGNOSIS — I10 Essential (primary) hypertension: Secondary | ICD-10-CM | POA: Diagnosis not present

## 2021-03-24 LAB — POCT GLYCOSYLATED HEMOGLOBIN (HGB A1C): Hemoglobin A1C: 5.8 % — AB (ref 4.0–5.6)

## 2021-03-24 NOTE — Telephone Encounter (Signed)
Spoke with Claiborne Billings from [employer] [facility] in regards to auto-titration for patient for osa. Claiborne Billings will look into it and reach out to patient. ?

## 2021-03-24 NOTE — Progress Notes (Addendum)
[facility] [address] [place], [place] [address]  Internal MEDICINE  Office Visit Note  Patient Name: Frances Greene  [id number]  [id number]  Date of Service: 03/30/2021  Chief Complaint  Patient presents with   Follow-up   Diabetes   Hypertension   Asthma    HPI Patient is here for routine follow-up overall she feels well. Diabetes is very well controlled, she does have arthritic pain in her hands, history of gout as well, she also is complaining of burning in her hands and feet has tried tramadol in the past with no relief. Patient is requesting a new CPAP machine since her previous one is broken it was an order done by Dr. Freda Munro on previous visit will reach out to to [facility]. Blood pressure systolic is slightly elevated however home readings have been within normal limits Patient has chronic kidney disease followed by nephrology her creatinine has been stable Patient is also here to discuss the findings of her mammogram, she has previous history of having biopsy done in that area which has been normal and she does not want to have any further diagnostic until her routine mammogram next year  Current Medication: Outpatient Encounter Medications as of 03/24/2021  Medication Sig   acetaminophen (TYLENOL) 325 MG tablet Take 650 mg by mouth every 6 (six) hours as needed.   allopurinol (ZYLOPRIM) 300 MG tablet Take 1 tablet (300 mg total) by mouth daily.   AMBULATORY NON FORMULARY MEDICATION Medication Name: geni kot natural veggie laxitive   aspirin EC 325 MG tablet Take 325 mg by mouth.   atorvastatin (LIPITOR) 10 MG tablet Take 1 tablet (10 mg total) by mouth daily.   betamethasone dipropionate 0.05 % cream Apply topically 2 (two) times daily.   bisoprolol-hydrochlorothiazide (ZIAC) 5-6.25 MG tablet Take 1 tablet by mouth daily.   Blood Glucose Monitoring Suppl (ONETOUCH VERIO REFLECT) w/Device KIT Use as directed for once daily Dx E11.65    Cholecalciferol (VITAMIN D3) 1.25 MG (50000 UT) CAPS Take by mouth daily.   cinacalcet (SENSIPAR) 30 MG tablet    citalopram (CELEXA) 20 MG tablet TAKE 1 TABLET BY MOUTH ONCE DAILY FOR DEPRESSION   fluticasone-salmeterol (ADVAIR) 100-50 MCG/ACT AEPB Inhale 1 puff into the lungs 2 (two) times daily.   folic acid (FOLVITE) 1 MG tablet Take 1 tablet (1 mg total) by mouth daily.   furosemide (LASIX) 40 MG tablet TAKE 1 TABLET BY MOUTH ONCE DAILY AND  TAKE  XTRA  TAB  IF  NEEDED   Garlic 1000 MG CAPS Take by mouth.   glimepiride (AMARYL) 1 MG tablet Take 1 tablet (1 mg total) by mouth daily.   glucose blood (ONETOUCH VERIO) test strip Use as directed once a daily   hydrALAZINE (APRESOLINE) 25 MG tablet Take three times a day   HYDROcodone-acetaminophen (NORCO/VICODIN) 5-325 MG tablet Take 1 tablet by mouth every 6 (six) hours as needed for moderate pain or severe pain.   Insulin Pen Needle (PEN NEEDLES) 32G X 4 MM MISC 1 Units by Does not apply route daily. Use with victoza injections daily.   ketoconazole (NIZORAL) 2 % shampoo SHAMPOO AS DIRECTED   latanoprost (XALATAN) 0.005 % ophthalmic solution latanoprost 0.005 % eye drops  INSTILL 1 DROP INTO EACH EYE IN THE EVENING   liraglutide (VICTOZA) 18 MG/3ML SOPN INJECT 1.8 MG ONCE DAILY SUBCUTANEOUSLY   losartan (COZAAR) 25 MG tablet Patient takes 1/2 tab daily   meloxicam (MOBIC) 7.5 MG tablet TAKE  1 TABLET BY MOUTH ONCE DAILY FOR  GOUT  AND  ARTHRITIS   mirabegron ER (MYRBETRIQ) 50 MG TB24 tablet Take 1 tablet (50 mg total) by mouth daily.   montelukast (SINGULAIR) 10 MG tablet Take 1 tablet (10 mg total) by mouth daily.   Omega-3 Fatty Acids (FISH OIL PO) Take by mouth.   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   Sulfacetamide Sodium (SODIUM SULFACETAMIDE) 10 % SHAM Apply topically.   tizanidine (ZANAFLEX) 2 MG capsule Take one tab po qhs for spasm    traZODone (DESYREL) 50 MG tablet Take 0.5-1 tablets (25-50 mg total) by mouth at bedtime as needed for sleep.   Vitamin D, Ergocalciferol, (DRISDOL) 1.25 MG (50000 UNIT) CAPS capsule Take 1 cap once a week   No facility-administered encounter medications on file as of 03/24/2021.    Surgical History: Past Surgical History:  Procedure Laterality Date   ABDOMINAL HYSTERECTOMY     BREAST BIOPSY Left 1989   neg   CATARACT EXTRACTION W/PHACO Right 05/21/2015   Procedure: CATARACT EXTRACTION PHACO AND INTRAOCULAR LENS PLACEMENT (IOC);  Surgeon: Marcelene Butte, MD;  Location: [facility];  Service: Ophthalmology;  Laterality: Right;  [place]: AP%: 10.0 CDE: 9.53   CATARACT EXTRACTION W/PHACO Left 12/31/2020   Procedure: CATARACT EXTRACTION PHACO AND INTRAOCULAR LENS PLACEMENT (IOC) LEFT DIABETIC 4.13 01:00.9;  Surgeon: Lockie Mola, MD;  [facility];  Service: Ophthalmology;  Laterality: Left;   COLONOSCOPY     JOINT REPLACEMENT Bilateral    knee   PARS PLANA VITRECTOMY Right 05/21/2015   Procedure: PARS PLANA VITRECTOMY WITH 25 GAUGE;  Surgeon: Marcelene Butte, MD;  Location: [facility];  Service: Ophthalmology;  Laterality: Right;  Lot # [id number] Endo laser: Watts: 300 Pulse Duration: 200 Total Pulses:143    shoulder surgery      Medical History: Past Medical History:  Diagnosis Date   Arthritis    Asthma    Bronchitis    CHF (congestive heart failure) (HCC)    Chronic kidney disease    decreased kidney function   Diabetes mellitus without complication (HCC)    Gout    Heart murmur    Hypertension    Multiple allergies    Shortness of breath dyspnea    w/ exertion   Sleep apnea    CPAP   Swelling of both lower extremities    Wheezing     Family History: Family History  Problem Relation Age of Onset   Breast cancer Cousin    Breast cancer Other    Hypertension Mother    Diabetes Mother    Hypertension Father    Diabetes Father    Alcohol abuse Father     Aneurysm Sister    Alcohol abuse Sister    Stroke Sister        died from stroke   Hepatitis Brother    Hypertension Sister        dialysis   Diabetes Sister    Alcoholism Brother    Alcoholism Brother        dies from head injury from a fall.  not an alcoholic,   Cancer Brother    Diabetes Brother    Hypertension Brother    Diabetes Brother    Cancer Brother    Hypertension Brother    Stroke Brother    Alcoholism Brother     Social History  Socioeconomic History   Marital status: Divorced    Spouse name: Not on file   Number of children: Not on file   Years of education: Not on file   Highest education level: Not on file  Occupational History   Not on file  Tobacco Use   Smoking status: Former    Types: Cigarettes   Smokeless tobacco: Never   Tobacco comments:    4 cigarettes quit 20 years ago  Vaping Use   Vaping Use: Never used  Substance and Sexual Activity   Alcohol use: No   Drug use: Never   Sexual activity: Not on file  Other Topics Concern   Not on file  Social History Narrative   Not on file   Social Determinants of Health   Financial Resource Strain: Low Risk    Difficulty of Paying Living Expenses: Not very hard  Food Insecurity: Not on file  Transportation Needs: Not on file  Physical Activity: Not on file  Stress: Not on file  Social Connections: Not on file  Intimate Partner Violence: Not on file      Review of Systems  Constitutional:  Negative for chills, fatigue and unexpected weight change.  HENT:  Positive for postnasal drip. Negative for congestion, rhinorrhea, sneezing and sore throat.   Eyes:  Negative for redness.  Respiratory:  Negative for cough, chest tightness and shortness of breath.   Cardiovascular:  Negative for chest pain and palpitations.  Gastrointestinal:  Negative for abdominal pain, constipation, diarrhea, nausea and vomiting.  Genitourinary:  Negative for dysuria and frequency.  Musculoskeletal:  Negative  for arthralgias, back pain, joint swelling and neck pain.  Skin:  Negative for rash.  Neurological:  Positive for numbness. Negative for tremors.       Burning in hand and feet   Hematological:  Negative for adenopathy. Does not bruise/bleed easily.  Psychiatric/Behavioral:  Negative for behavioral problems (Depression), sleep disturbance and suicidal ideas. The patient is not nervous/anxious.    Vital Signs: BP (!) 150/66   Pulse 93   Temp 98 F (36.7 C)   Resp 16   Ht 5\' 3"  (1.6 m)   Wt 223 lb 6.4 oz (101.3 kg)   SpO2 99%   BMI 39.57 kg/m    Physical Exam Constitutional:      Appearance: Normal appearance.  HENT:     Head: Normocephalic and atraumatic.     Nose: Nose normal.     Mouth/Throat:     Mouth: Mucous membranes are moist.     Pharynx: No posterior oropharyngeal erythema.  Eyes:     Extraocular Movements: Extraocular movements intact.     Pupils: Pupils are equal, round, and reactive to light.  Cardiovascular:     Pulses:          Dorsalis pedis pulses are 3+ on the right side and 3+ on the left side.       Posterior tibial pulses are 3+ on the right side and 3+ on the left side.     Heart sounds: Normal heart sounds.  Pulmonary:     Effort: Pulmonary effort is normal.     Breath sounds: Normal breath sounds.  Musculoskeletal:     Right foot: Normal range of motion.     Left foot: Normal range of motion.  Feet:     Right foot:     Protective Sensation: 2 sites tested.  2 sites sensed.     Skin integrity: Skin integrity normal.  Toenail Condition: Right toenails are normal.     Left foot:     Protective Sensation: 2 sites tested.  2 sites sensed.     Skin integrity: Skin integrity normal.     Toenail Condition: Left toenails are normal.  Neurological:     General: No focal deficit present.     Mental Status: She is alert.  Psychiatric:        Mood and Affect: Mood normal.        Behavior: Behavior normal.    A/P  1. Controlled type 2 diabetes  with neuropathy (HCC) Well-controlled diabetes with hemoglobin A1c of 5.8 we will add low-dose Lyrica 25 mg twice a day for her peripheral neuropathy symptoms - POCT HgB A1C  2. Abnormal mammogram of left breast Abnormal mammogram in the past the patient does not want to have any further follow-ups until her mammogram next year  3. OSA (obstructive sleep apnea) Patient had Abnormal sleep study history of OSA in the past and has a broken machine will need new replacement in order for aUTOPap is given - For home use only DME continuous positive airway pressure (CPAP)  4. Dysuria Check UA with C&S if still needed  5. Essential hypertensioN Systolic blood pressure is slightly elevated she will continue to monitor at home  6. Stage 3b chronic kidney disease (HCC) This is followed by nephrology creatinine has been stable in the last 6 months  General Counseling: Frances Greene understanding of the findings of todays visit and agrees with plan of treatment. I have discussed any further diagnostic evaluation that may be needed or ordered today. We also reviewed her medications today. she has been encouraged to call the office with any questions or concerns that should arise related to todays visit.    Orders Placed This Encounter  Procedures   For home use only DME continuous positive airway pressure (CPAP)   POCT HgB A1C    No orders of the defined types were placed in this encounter.   Total time spent:35 Minutes Time spent includes review of chart, medications, test results, and follow up plan with the patient.   [place] Controlled Substance Database was reviewed by me.   Dr Lyndon Code Internal medicine

## 2021-03-24 NOTE — Telephone Encounter (Signed)
Printed 02/24/21 cpap titration order. Gave to Titania-Toni ?

## 2021-03-26 ENCOUNTER — Telehealth: Payer: Self-pay

## 2021-03-27 ENCOUNTER — Other Ambulatory Visit: Payer: Self-pay | Admitting: Nurse Practitioner

## 2021-03-27 ENCOUNTER — Other Ambulatory Visit: Payer: Self-pay | Admitting: Internal Medicine

## 2021-03-27 MED ORDER — PREGABALIN 25 MG PO CAPS: 25.0000 mg | ORAL_CAPSULE | Freq: Two times a day (BID) | ORAL | 1 refills | Status: DC

## 2021-03-27 MED ORDER — PREGABALIN 25 MG PO CAPS: 25.0000 mg | ORAL_CAPSULE | Freq: Two times a day (BID) | ORAL | 0 refills | Status: DC

## 2021-03-27 NOTE — Telephone Encounter (Signed)
Spoke to pt and informed her of the information the DFK advised of the reasons pt is having with her hands, and I informed pt that DFK sent LYRICA to the pharmacy to try to see if it helps and explained how to take the medication.   Informed pt to let us know if she has any problems ?

## 2021-03-27 NOTE — Telephone Encounter (Signed)
I sent Lyrica for her, she is suffering from Diabetic neuropathy and arthritis, try this medicine to see if it helps. It is important that she takes the medicine regularly and everyday to be effective

## 2021-04-01 ENCOUNTER — Other Ambulatory Visit: Payer: HMO

## 2021-04-01 ENCOUNTER — Other Ambulatory Visit: Payer: Self-pay

## 2021-04-01 ENCOUNTER — Encounter: Payer: Self-pay | Admitting: Oncology

## 2021-04-01 ENCOUNTER — Ambulatory Visit: Payer: HMO | Admitting: Oncology

## 2021-04-01 ENCOUNTER — Inpatient Hospital Stay (HOSPITAL_BASED_OUTPATIENT_CLINIC_OR_DEPARTMENT_OTHER): Payer: HMO | Admitting: Oncology

## 2021-04-01 ENCOUNTER — Inpatient Hospital Stay: Payer: HMO | Attending: Oncology | Admitting: Oncology

## 2021-04-01 ENCOUNTER — Telehealth: Payer: Self-pay | Admitting: *Deleted

## 2021-04-01 VITALS — BP 163/76 | HR 69 | Temp 96.8°F | Wt 229.0 lb

## 2021-04-01 DIAGNOSIS — N1832 Chronic kidney disease, stage 3b: Secondary | ICD-10-CM | POA: Diagnosis not present

## 2021-04-01 DIAGNOSIS — Z803 Family history of malignant neoplasm of breast: Secondary | ICD-10-CM | POA: Diagnosis not present

## 2021-04-01 DIAGNOSIS — I130 Hypertensive heart and chronic kidney disease with heart failure and stage 1 through stage 4 chronic kidney disease, or unspecified chronic kidney disease: Secondary | ICD-10-CM | POA: Diagnosis not present

## 2021-04-01 DIAGNOSIS — I509 Heart failure, unspecified: Secondary | ICD-10-CM | POA: Diagnosis not present

## 2021-04-01 DIAGNOSIS — E538 Deficiency of other specified B group vitamins: Secondary | ICD-10-CM | POA: Diagnosis not present

## 2021-04-01 DIAGNOSIS — G4730 Sleep apnea, unspecified: Secondary | ICD-10-CM | POA: Insufficient documentation

## 2021-04-01 DIAGNOSIS — E1122 Type 2 diabetes mellitus with diabetic chronic kidney disease: Secondary | ICD-10-CM | POA: Diagnosis not present

## 2021-04-01 DIAGNOSIS — Z87891 Personal history of nicotine dependence: Secondary | ICD-10-CM | POA: Insufficient documentation

## 2021-04-01 DIAGNOSIS — D509 Iron deficiency anemia, unspecified: Secondary | ICD-10-CM

## 2021-04-01 DIAGNOSIS — D631 Anemia in chronic kidney disease: Secondary | ICD-10-CM

## 2021-04-01 DIAGNOSIS — Z90710 Acquired absence of both cervix and uterus: Secondary | ICD-10-CM | POA: Insufficient documentation

## 2021-04-01 LAB — COMPREHENSIVE METABOLIC PANEL
ALT: 10 U/L (ref 0–44)
AST: 13 U/L — ABNORMAL LOW (ref 15–41)
Albumin: 3.8 g/dL (ref 3.5–5.0)
Alkaline Phosphatase: 49 U/L (ref 38–126)
Anion gap: 6 (ref 5–15)
BUN: 30 mg/dL — ABNORMAL HIGH (ref 8–23)
CO2: 31 mmol/L (ref 22–32)
Calcium: 9.7 mg/dL (ref 8.9–10.3)
Chloride: 104 mmol/L (ref 98–111)
Creatinine, Ser: 1.64 mg/dL — ABNORMAL HIGH (ref 0.44–1.00)
GFR, Estimated: 32 mL/min — ABNORMAL LOW (ref >60)
Glucose, Bld: 81 mg/dL (ref 70–99)
Potassium: 3.9 mmol/L (ref 3.5–5.1)
Sodium: 141 mmol/L (ref 135–145)
Total Bilirubin: 0.5 mg/dL (ref 0.3–1.2)
Total Protein: 6.4 g/dL — ABNORMAL LOW (ref 6.5–8.1)

## 2021-04-01 LAB — CBC WITH DIFFERENTIAL/PLATELET
Abs Immature Granulocytes: 0.04 10*3/uL (ref 0.00–0.07)
Basophils Absolute: 0 10*3/uL (ref 0.0–0.1)
Basophils Relative: 0 %
Eosinophils Absolute: 0.2 10*3/uL (ref 0.0–0.5)
Eosinophils Relative: 4 %
HCT: 36.3 % (ref 36.0–46.0)
Hemoglobin: 11.5 g/dL — ABNORMAL LOW (ref 12.0–15.0)
Immature Granulocytes: 1 %
Lymphocytes Relative: 19 %
Lymphs Abs: 1.1 10*3/uL (ref 0.7–4.0)
MCH: 27.6 pg (ref 26.0–34.0)
MCHC: 31.7 g/dL (ref 30.0–36.0)
MCV: 87.3 fL (ref 80.0–100.0)
Monocytes Absolute: 0.5 10*3/uL (ref 0.1–1.0)
Monocytes Relative: 9 %
Neutro Abs: 3.7 10*3/uL (ref 1.7–7.7)
Neutrophils Relative %: 67 %
Platelets: 125 10*3/uL — ABNORMAL LOW (ref 150–400)
RBC: 4.16 MIL/uL (ref 3.87–5.11)
RDW: 13.8 % (ref 11.5–15.5)
WBC: 5.6 10*3/uL (ref 4.0–10.5)
nRBC: 0 % (ref 0.0–0.2)

## 2021-04-01 LAB — FERRITIN: Ferritin: 22 ng/mL (ref 11–307)

## 2021-04-01 LAB — VITAMIN B12: Vitamin B-12: 411 pg/mL (ref 180–914)

## 2021-04-01 LAB — IRON AND TIBC
Iron: 77 ug/dL (ref 28–170)
Saturation Ratios: 22 % (ref 10.4–31.8)
TIBC: 353 ug/dL (ref 250–450)
UIBC: 276 ug/dL

## 2021-04-01 NOTE — Progress Notes (Signed)
? ? ? ?Hematology/Oncology Consult note ?[place]  ?[phone] [phone] ? ?Patient Care Team: ?Jonetta Osgood, NP as PCP - General (Nurse Practitioner) ?Alena Bills, [facility] as Pharmacist (Pharmacist)  ? ?Name of the patient: Frances Greene  ?[id number]  ?December 18, 1945  ? ?Date of visit: 04/01/21 ? ?Diagnosis-anemia of chronic kidney disease ? ?Chief complaint/ Reason for visit-routine follow-up of anemia of chronic kidney disease ? ?Heme/Onc history: Patient is a 76 year old female with a past medical history significant for stage III CKD, CHF, hypertension, sleep apnea among other medical problems.  She has a history of anemia of chronic kidney disease but has not required any Retacrit yet.  Hemoglobin has remained stable around 10 since 2020.  No overt evidence of iron deficiency and she has not required any IV iron in the past.  She is on oral B12 for B12 deficiency. ? ?Interval history-reports doing well overall and feels at baseline.  Has some stable age-related fatigue.  Followed closely by nephrology whom she saw a few weeks ago.  Denies any bleeding.  Eats a good variety of foods in her diet. ? ?ECOG PS- 1 ?Pain scale- 0 ? ? ?Review of systems- Review of Systems  ?Constitutional:  Positive for malaise/fatigue. Negative for chills, fever and weight loss.  ?HENT:  Negative for congestion, ear pain and tinnitus.   ?Eyes: Negative.  Negative for blurred vision and double vision.  ?Respiratory: Negative.  Negative for cough, sputum production and shortness of breath.   ?Cardiovascular: Negative.  Negative for chest pain, palpitations and leg swelling.  ?Gastrointestinal: Negative.  Negative for abdominal pain, constipation, diarrhea, nausea and vomiting.  ?Genitourinary:  Negative for dysuria, frequency and urgency.  ?Musculoskeletal:  Negative for back pain and falls.  ?Skin: Negative.  Negative for rash.  ?Neurological: Negative.  Negative for weakness and  headaches.  ?Endo/Heme/Allergies: Negative.  Does not bruise/bleed easily.  ?Psychiatric/Behavioral: Negative.  Negative for depression. The patient is not nervous/anxious and does not have insomnia.    ? ? ? ?Allergies  ?Allergen Reactions  ? Ramipril Cough and Other (See Comments)  ?  Other reaction(s): Cough ?Other reaction(s): Cough ?Other reaction(s): Other (See Comments) ?Other reaction(s): Cough ?Other reaction(s): Cough ?Other reaction(s): Cough ?  ? ? ? ?Past Medical History:  ?Diagnosis Date  ? Arthritis   ? Asthma   ? Bronchitis   ? CHF (congestive heart failure) ([place])   ? Chronic kidney disease   ? decreased kidney function  ? Diabetes mellitus without complication ([place])   ? Gout   ? Heart murmur   ? Hypertension   ? Multiple allergies   ? Shortness of breath dyspnea   ? w/ exertion  ? Sleep apnea   ? CPAP  ? Swelling of both lower extremities   ? Wheezing   ? ? ? ?Past Surgical History:  ?Procedure Laterality Date  ? ABDOMINAL HYSTERECTOMY    ? BREAST BIOPSY Left 1989  ? neg  ? CATARACT EXTRACTION W/PHACO Right 05/21/2015  ? Procedure: CATARACT EXTRACTION PHACO AND INTRAOCULAR LENS PLACEMENT (IOC);  Surgeon: Milus Height, MD;  Location: [facility];  Service: Ophthalmology;  Laterality: Right;  [place]: ?AP%: 10.0 ?CDE: 9.53  ? CATARACT EXTRACTION W/PHACO Left 12/31/2020  ? Procedure: CATARACT EXTRACTION PHACO AND INTRAOCULAR LENS PLACEMENT (IOC) LEFT DIABETIC 4.13 01:00.9;  Surgeon: Leandrew Koyanagi, MD;  Location: [place];  Service: Ophthalmology;  Laterality: Left;  ? COLONOSCOPY    ? JOINT REPLACEMENT Bilateral   ? knee  ?  PARS PLANA VITRECTOMY Right 05/21/2015  ? Procedure: PARS PLANA VITRECTOMY WITH 25 GAUGE;  Surgeon: Milus Height, MD;  Location: [facility];  Service: Ophthalmology;  Laterality: Right;  Lot # [id number] H ?Endo laser: Watts: 300 Pulse Duration: 200 Total Pulses:143 ?  ? shoulder surgery    ? ? ?Social History  ? ?Socioeconomic History  ? Marital status: Divorced  ?  Spouse  name: Not on file  ? Number of children: Not on file  ? Years of education: Not on file  ? Highest education level: Not on file  ?Occupational History  ? Not on file  ?Tobacco Use  ? Smoking status: Former  ?  Types: Cigarettes  ? Smokeless tobacco: Never  ? Tobacco comments:  ?  4 cigarettes quit 20 years ago  ?Vaping Use  ? Vaping Use: Never used  ?Substance and Sexual Activity  ? Alcohol use: No  ? Drug use: Never  ? Sexual activity: Not on file  ?Other Topics Concern  ? Not on file  ?Social History Narrative  ? Not on file  ? ?Social Determinants of Health  ? ?Financial Resource Strain: Low Risk   ? Difficulty of Paying Living Expenses: Not very hard  ?Food Insecurity: Not on file  ?Transportation Needs: Not on file  ?Physical Activity: Not on file  ?Stress: Not on file  ?Social Connections: Not on file  ?Intimate Partner Violence: Not on file  ? ? ?Family History  ?Problem Relation Age of Onset  ? Breast cancer Cousin   ? Breast cancer Other   ? Hypertension Mother   ? Diabetes Mother   ? Hypertension Father   ? Diabetes Father   ? Alcohol abuse Father   ? Aneurysm Sister   ? Alcohol abuse Sister   ? Stroke Sister   ?     died from stroke  ? Hepatitis Brother   ? Hypertension Sister   ?     dialysis  ? Diabetes Sister   ? Alcoholism Brother   ? Alcoholism Brother   ?     dies from head injury from a fall.  not an alcoholic,  ? Cancer Brother   ? Diabetes Brother   ? Hypertension Brother   ? Diabetes Brother   ? Cancer Brother   ? Hypertension Brother   ? Stroke Brother   ? Alcoholism Brother   ? ? ? ?Current Outpatient Medications:  ?  acetaminophen (TYLENOL) 325 MG tablet, Take 650 mg by mouth every 6 (six) hours as needed., Disp: , Rfl:  ?  allopurinol (ZYLOPRIM) 300 MG tablet, Take 1 tablet (300 mg total) by mouth daily., Disp: 90 tablet, Rfl: 1 ?  AMBULATORY NON FORMULARY MEDICATION, Medication Name: geni kot natural veggie laxitive, Disp: , Rfl:  ?  aspirin EC 325 MG tablet, Take 325 mg by mouth., Disp: ,  Rfl:  ?  atorvastatin (LIPITOR) 10 MG tablet, Take 1 tablet (10 mg total) by mouth daily., Disp: 90 tablet, Rfl: 1 ?  betamethasone dipropionate 0.05 % cream, Apply topically 2 (two) times daily., Disp: 45 g, Rfl: 1 ?  bisoprolol-hydrochlorothiazide (ZIAC) 5-6.25 MG tablet, Take 1 tablet by mouth daily., Disp: 90 tablet, Rfl: 1 ?  Blood Glucose Monitoring Suppl (ONETOUCH VERIO REFLECT) w/Device KIT, Use as directed for once daily Dx E11.65, Disp: 1 kit, Rfl: 0 ?  Cholecalciferol (VITAMIN D3) 1.25 MG (50000 UT) CAPS, Take by mouth daily., Disp: , Rfl:  ?  cinacalcet (SENSIPAR) 30 MG tablet, , Disp: ,  Rfl:  ?  citalopram (CELEXA) 20 MG tablet, TAKE 1 TABLET BY MOUTH ONCE DAILY FOR DEPRESSION, Disp: 90 tablet, Rfl: 1 ?  fluticasone-salmeterol (ADVAIR) 100-50 MCG/ACT AEPB, Inhale 1 puff into the lungs 2 (two) times daily., Disp: 60 each, Rfl: 3 ?  folic acid (FOLVITE) 1 MG tablet, Take 1 tablet (1 mg total) by mouth daily., Disp: 90 tablet, Rfl: 1 ?  furosemide (LASIX) 40 MG tablet, TAKE 1 TABLET BY MOUTH ONCE DAILY AND  TAKE  XTRA  TAB  IF  NEEDED, Disp: 180 tablet, Rfl: 1 ?  Garlic 9201 MG CAPS, Take by mouth., Disp: , Rfl:  ?  glimepiride (AMARYL) 1 MG tablet, Take 1 tablet (1 mg total) by mouth daily., Disp: 90 tablet, Rfl: 1 ?  glucose blood (ONETOUCH VERIO) test strip, Use as directed once a daily, Disp: 300 strip, Rfl: 1 ?  hydrALAZINE (APRESOLINE) 25 MG tablet, Take three times a day, Disp: 270 tablet, Rfl: 1 ?  HYDROcodone-acetaminophen (NORCO/VICODIN) 5-325 MG tablet, Take 1 tablet by mouth every 6 (six) hours as needed for moderate pain or severe pain., Disp: 12 tablet, Rfl: 0 ?  Insulin Pen Needle (PEN NEEDLES) 32G X 4 MM MISC, 1 Units by Does not apply route daily. Use with victoza injections daily., Disp: 100 each, Rfl: 5 ?  ketoconazole (NIZORAL) 2 % shampoo, SHAMPOO AS DIRECTED, Disp: 120 mL, Rfl: 3 ?  latanoprost (XALATAN) 0.005 % ophthalmic solution, latanoprost 0.005 % eye drops  INSTILL 1 DROP INTO  EACH EYE IN THE EVENING, Disp: , Rfl:  ?  liraglutide (VICTOZA) 18 MG/3ML SOPN, INJECT 1.8 MG ONCE DAILY SUBCUTANEOUSLY, Disp: 270 mL, Rfl: 3 ?  losartan (COZAAR) 25 MG tablet, Patient takes 1/2 tab daily

## 2021-04-01 NOTE — Telephone Encounter (Signed)
Patient called to report that she had gone to the [facility] facility for today's appointment stated she did not know it had closed. She stated she was trying to get here. ?

## 2021-04-02 ENCOUNTER — Other Ambulatory Visit: Payer: Self-pay

## 2021-04-02 ENCOUNTER — Telehealth: Payer: Self-pay

## 2021-04-02 MED ORDER — CITALOPRAM HYDROBROMIDE 10 MG PO TABS: 10.0000 mg | ORAL_TABLET | Freq: Every day | ORAL | 1 refills | Status: DC

## 2021-04-02 NOTE — Telephone Encounter (Signed)
Pt called that she want citalopram 10 mg send to [facility] due to she was advised by dr Humphrey Rolls she before that she can take citalopram 20 mg half tab daily and as per dr change to citalopram 10 mg daily also advised that we cannot change allopurinol 300 mg to 150 mg as per dr Humphrey Rolls advised her to take every other day ad pres was already send on 03/18/2021 for 1 daily  ?

## 2021-04-02 NOTE — Progress Notes (Signed)
I forget if she is taking supplements. She can start iron and b12 supplements if she can tolerate.  ? ?Faythe Casa, NP ?04/02/2021 11:41 AM ?

## 2021-04-03 ENCOUNTER — Telehealth: Payer: Self-pay | Admitting: *Deleted

## 2021-04-07 ENCOUNTER — Telehealth: Payer: Self-pay | Admitting: *Deleted

## 2021-04-07 NOTE — Telephone Encounter (Signed)
The patient called and I told her that I left message to call me back about her iron levels and b12 level. The ferritin was 30 and Sonia Baller NP rec: that she start ferrous sulfated 325 mg with food every day and the b12 was on low side of  normal and rec: to take b12 1000 mcg daily. Both are over the counter. She wrote both meds down and I spelled them for her and dose and she states she wrote it down and will start them soon as she gets them. I did let her know that we tell pt's to take it with food or full stomach because it can casus constipation, stomach cramps. She will let us know if she has issues ?

## 2021-04-08 ENCOUNTER — Telehealth: Payer: Self-pay

## 2021-04-08 NOTE — Telephone Encounter (Signed)
Desha faxed patient's oxygen/respiratory paperwork to [phone] on 04/03/2021. ?

## 2021-05-07 ENCOUNTER — Ambulatory Visit: Payer: HMO | Admitting: Nurse Practitioner

## 2021-05-20 DIAGNOSIS — G4733 Obstructive sleep apnea (adult) (pediatric): Secondary | ICD-10-CM | POA: Diagnosis not present

## 2021-05-29 ENCOUNTER — Telehealth: Payer: Self-pay

## 2021-05-29 DIAGNOSIS — M109 Gout, unspecified: Secondary | ICD-10-CM | POA: Diagnosis not present

## 2021-05-29 DIAGNOSIS — M1990 Unspecified osteoarthritis, unspecified site: Secondary | ICD-10-CM | POA: Diagnosis not present

## 2021-05-29 DIAGNOSIS — D696 Thrombocytopenia, unspecified: Secondary | ICD-10-CM | POA: Diagnosis not present

## 2021-05-29 DIAGNOSIS — R079 Chest pain, unspecified: Secondary | ICD-10-CM | POA: Diagnosis not present

## 2021-05-29 DIAGNOSIS — F419 Anxiety disorder, unspecified: Secondary | ICD-10-CM | POA: Diagnosis not present

## 2021-05-29 DIAGNOSIS — R918 Other nonspecific abnormal finding of lung field: Secondary | ICD-10-CM | POA: Diagnosis not present

## 2021-05-29 DIAGNOSIS — F32A Depression, unspecified: Secondary | ICD-10-CM | POA: Diagnosis not present

## 2021-05-29 DIAGNOSIS — I130 Hypertensive heart and chronic kidney disease with heart failure and stage 1 through stage 4 chronic kidney disease, or unspecified chronic kidney disease: Secondary | ICD-10-CM | POA: Diagnosis not present

## 2021-05-29 DIAGNOSIS — R7989 Other specified abnormal findings of blood chemistry: Secondary | ICD-10-CM | POA: Diagnosis not present

## 2021-05-29 DIAGNOSIS — R9431 Abnormal electrocardiogram [ECG] [EKG]: Secondary | ICD-10-CM | POA: Diagnosis not present

## 2021-05-29 DIAGNOSIS — G4733 Obstructive sleep apnea (adult) (pediatric): Secondary | ICD-10-CM | POA: Diagnosis not present

## 2021-05-29 DIAGNOSIS — I5033 Acute on chronic diastolic (congestive) heart failure: Secondary | ICD-10-CM | POA: Diagnosis not present

## 2021-05-29 DIAGNOSIS — E876 Hypokalemia: Secondary | ICD-10-CM | POA: Diagnosis not present

## 2021-05-29 DIAGNOSIS — I517 Cardiomegaly: Secondary | ICD-10-CM | POA: Diagnosis not present

## 2021-05-29 DIAGNOSIS — N189 Chronic kidney disease, unspecified: Secondary | ICD-10-CM | POA: Diagnosis not present

## 2021-05-29 DIAGNOSIS — Z79899 Other long term (current) drug therapy: Secondary | ICD-10-CM | POA: Diagnosis not present

## 2021-05-29 DIAGNOSIS — L299 Pruritus, unspecified: Secondary | ICD-10-CM | POA: Diagnosis not present

## 2021-05-29 DIAGNOSIS — Z7984 Long term (current) use of oral hypoglycemic drugs: Secondary | ICD-10-CM | POA: Diagnosis not present

## 2021-05-29 DIAGNOSIS — I110 Hypertensive heart disease with heart failure: Secondary | ICD-10-CM | POA: Diagnosis not present

## 2021-05-29 DIAGNOSIS — J811 Chronic pulmonary edema: Secondary | ICD-10-CM | POA: Diagnosis not present

## 2021-05-29 DIAGNOSIS — M7989 Other specified soft tissue disorders: Secondary | ICD-10-CM | POA: Diagnosis not present

## 2021-05-29 DIAGNOSIS — F418 Other specified anxiety disorders: Secondary | ICD-10-CM | POA: Diagnosis not present

## 2021-05-29 DIAGNOSIS — R0789 Other chest pain: Secondary | ICD-10-CM | POA: Diagnosis not present

## 2021-05-29 DIAGNOSIS — E1122 Type 2 diabetes mellitus with diabetic chronic kidney disease: Secondary | ICD-10-CM | POA: Diagnosis not present

## 2021-05-29 DIAGNOSIS — I509 Heart failure, unspecified: Secondary | ICD-10-CM | POA: Diagnosis not present

## 2021-05-29 DIAGNOSIS — I272 Pulmonary hypertension, unspecified: Secondary | ICD-10-CM | POA: Diagnosis not present

## 2021-05-29 DIAGNOSIS — Z888 Allergy status to other drugs, medicaments and biological substances status: Secondary | ICD-10-CM | POA: Diagnosis not present

## 2021-05-29 DIAGNOSIS — E785 Hyperlipidemia, unspecified: Secondary | ICD-10-CM | POA: Diagnosis not present

## 2021-05-29 DIAGNOSIS — N179 Acute kidney failure, unspecified: Secondary | ICD-10-CM | POA: Diagnosis not present

## 2021-05-29 DIAGNOSIS — Z7951 Long term (current) use of inhaled steroids: Secondary | ICD-10-CM | POA: Diagnosis not present

## 2021-05-29 DIAGNOSIS — E8770 Fluid overload, unspecified: Secondary | ICD-10-CM | POA: Diagnosis not present

## 2021-05-29 DIAGNOSIS — Z9989 Dependence on other enabling machines and devices: Secondary | ICD-10-CM | POA: Diagnosis not present

## 2021-05-29 DIAGNOSIS — R0600 Dyspnea, unspecified: Secondary | ICD-10-CM | POA: Diagnosis not present

## 2021-05-29 DIAGNOSIS — J45909 Unspecified asthma, uncomplicated: Secondary | ICD-10-CM | POA: Diagnosis not present

## 2021-05-29 DIAGNOSIS — I351 Nonrheumatic aortic (valve) insufficiency: Secondary | ICD-10-CM | POA: Diagnosis not present

## 2021-05-29 DIAGNOSIS — Z87891 Personal history of nicotine dependence: Secondary | ICD-10-CM | POA: Diagnosis not present

## 2021-05-29 NOTE — Telephone Encounter (Signed)
Patient called after 12:15 pm complaining of left shoulder pain into breast area for the last 3 days. Patient was advised as per Alyssa to go to the ED or urgent care. ?

## 2021-06-04 ENCOUNTER — Telehealth: Payer: Self-pay

## 2021-06-04 ENCOUNTER — Ambulatory Visit (INDEPENDENT_AMBULATORY_CARE_PROVIDER_SITE_OTHER): Payer: HMO | Admitting: Nurse Practitioner

## 2021-06-04 ENCOUNTER — Encounter: Payer: Self-pay | Admitting: Physician Assistant

## 2021-06-04 VITALS — BP 138/67 | HR 63 | Temp 98.2°F | Resp 16 | Ht 63.0 in | Wt 223.2 lb

## 2021-06-04 DIAGNOSIS — I5032 Chronic diastolic (congestive) heart failure: Secondary | ICD-10-CM | POA: Diagnosis not present

## 2021-06-04 DIAGNOSIS — L080 Pyoderma: Secondary | ICD-10-CM | POA: Diagnosis not present

## 2021-06-04 DIAGNOSIS — E1165 Type 2 diabetes mellitus with hyperglycemia: Secondary | ICD-10-CM | POA: Diagnosis not present

## 2021-06-04 DIAGNOSIS — Z09 Encounter for follow-up examination after completed treatment for conditions other than malignant neoplasm: Secondary | ICD-10-CM | POA: Diagnosis not present

## 2021-06-04 DIAGNOSIS — R5383 Other fatigue: Secondary | ICD-10-CM

## 2021-06-04 DIAGNOSIS — N1832 Chronic kidney disease, stage 3b: Secondary | ICD-10-CM | POA: Diagnosis not present

## 2021-06-04 DIAGNOSIS — D696 Thrombocytopenia, unspecified: Secondary | ICD-10-CM

## 2021-06-04 NOTE — Telephone Encounter (Signed)
Frances Greene from [facility] [phone] that  pt is eligible for home health and home health aide and she will do meals on wheel for her advised her that we put her home health referral for [facility] today

## 2021-06-04 NOTE — Progress Notes (Signed)
[facility] [address], [address] [place] [address] Frances Greene Discharge Acute Issues Care Follow Up                                                                        Patient Demographics  Frances Greene, is a 76 y.o. female  DOB 08-Mar-1945  MRN [id number].  Primary MD  Jonetta Osgood, NP   Reason for TCC follow Up - CHF exacerbation   Past Medical History:  Diagnosis Date   Arthritis    Asthma    Bronchitis    CHF (congestive heart failure) (HCC)    Chronic kidney disease    decreased kidney function   Diabetes mellitus without complication (HCC)    Gout    Heart murmur    Hypertension    Multiple allergies    Shortness of breath dyspnea    w/ exertion   Sleep apnea    CPAP   Swelling of both lower extremities    Wheezing     Past Surgical History:  Procedure Laterality Date   ABDOMINAL HYSTERECTOMY     BREAST BIOPSY Left 1989   neg   CATARACT EXTRACTION W/PHACO Right 05/21/2015   Procedure: CATARACT EXTRACTION PHACO AND INTRAOCULAR LENS PLACEMENT ([place]);  Surgeon: Milus Height, MD;  Location: [facility]S;  Service: Ophthalmology;  Laterality: Right;  [place]: AP%: 10.0 CDE: 9.53   CATARACT EXTRACTION W/PHACO Left 12/31/2020   Procedure: CATARACT EXTRACTION PHACO AND INTRAOCULAR LENS PLACEMENT ([facility]) LEFT DIABETIC 4.13 01:00.9;  Surgeon: Leandrew Koyanagi, MD;  Location: [place];  Service: Ophthalmology;  Laterality: Left;   COLONOSCOPY     JOINT REPLACEMENT Bilateral    knee   PARS PLANA VITRECTOMY Right 05/21/2015   Procedure: PARS PLANA VITRECTOMY WITH 25 GAUGE;  Surgeon: Milus Height, MD;  Location: [facility];  Service: Ophthalmology;  Laterality: Right;  Lot # [id number] H Endo laser: Watts: 300 Pulse Duration: 200 Total Pulses:143    shoulder surgery         Recent HPI and Greene Course  Outpatient Provider Follow  Up Issues:  1. Follow-up with PCP within 1-2 weeks for post-inpatient follow-up 2. Obtain repeat renal function test to evaluate for resolution of AKI 3. Consider restarting losartan and hydrochlorothiazide if AKI resolved 4. Follow-up on new echocardiogram results 5. Consider outpatient cardiac stress test 6. Evaluate effect of Lasix 35m daily and determine need for dose adjustment  Greene Course:  BLorelee Mclaurinis a 76y.o. female with a past medical history significant for HTN, HLD, CHF, DM, CKD, asthma and depression who presents with 3 days of acutely worsened left sided scapular pain and heaviness with increased leg swelling and shortness of breath suggestive of CHF exacerbation.  # CHF Exacerbation  Dyspnea: Patient presented with 7 days of noticeably increased lower extremity swelling, progressively worsening dyspnea, and 3 days of scapular pressure like pain. HD stable on arrival. Initial concern for ACS while in the ED, however EKG without any ST  deviations and troponin was only mildly elevated to 40s which down-trended. Stable angina is possible given patient's symptoms, however not as consistent as her pain is not associated with activity. There was lower concern for PE with Wells scores of 0, no hypoxia and no evidence of DVT. No evidence of infection. CXR obtained with evidence of pulmonary edema and BNP was slightly elevated to 273. Patient with evidence of volume overload on exam with bilateral Frances edema and crackles appreciated on initial examination, most consistent with CHF exacerbation. Patient reported she had not been taking her home lasix, until she started noticing some swelling in her feet about 1 month ago. Etiology of exacerbation is most likely due to medication non-adherence. Echocardiogram was performed which showed LVEF 55-60% with grade II diastolic dysfunction. Patient was treated with IV lasix diuresis for 3 doses before transitioning to oral Lasix. Patient had  improvement in lower extremity edema with resolution of shortness of breath. Although symptoms were attributed to CHF exacerbation, would recommend outpatient stress test. Patient discharged on PO Lasix 65m daily along with empagliflozin 130mdaily (started during admission) and home bisoprolol. Referred to cardiac rehabilitation upon discharge. Patient to follow up with PCP within 1-2 weeks.  #AKI on CKD: Cr at baseline on admission. On day of discharge, patient's Cr elevated to 1.98, likely due to over-diuresis. Instructed patient to HOLD home losartan and hydrochlorothiazide upon discharge until patient follows up with PCP. Patient instructed to hold lasix on 5/16 for 1 day and resume taking on 06/03/21. Continued home cinacalcet.  Chronic stable conditions:  #T2DM: most recent a1c 5.8%. Held home viTustinhile inpatient, resumed at discharge. Patient was started on empagliflozin 1064maily in the setting of heart failure, and therefore home glimepiride was discontinued at discharge.  # HTN: BP stable throughout admission. Continued home hydralazine. Held home medications hydrochlorothiazide and losartan at discharge in setting of AKI. Provided prescription for bisoprolol alone (home medication was combination bisoprolol-hydrochlorothiazide) at discharge. Patient to follow-up with PCP for repeat renal function test before resuming hydrochlorothiazide and losartan.   # HLD: Continued home atorvastatin.   # Asthma  OSA: Continued home Advair (replaced by formulary equivalent while inpatient) and Montelukast. Provided CPAP while inpatient.   Depression/Anxiety: Continued home Citalopram.  #Gout: No recent flares. Continued home allopurinol.  COVID-19 Vaccination: defer to PCP   PosLe Greene to be followed in the Clinic   Discharge Diagnoses: Principal Problem: CHF exacerbation (CMS-HCC) Active Problems: HTN (hypertension) CKD (chronic kidney disease) Heart failure  (CMS-HCC) Type 2 diabetes mellitus (CMS-HCC) Gout Depression AKI (acute kidney injury) (CMS-HCC) Resolved Problems: Dyspnea   Subjective:   Frances Greene has, No headache, No chest pain, No abdominal pain - No Nausea, No new weakness tingling or numbness, No Cough - SOB. Patient is feeling better but is still fatigued and having generalized weakness, she will need home health.   Assessment & Plan   1. Greene discharge follow-up Patient went to the ER for shortness of breath and was diagnosed with a congestive heart failure exacerbation.  She has a preserved ejection fraction.  The patient is stable at this time status post discharge.  Hospitalist recommendations include repeat renal function test to evaluate for AKI resolution, considering outpatient cardiac stress test, evaluating effectiveness of Lasix and restarting losartan hydrochlorothiazide once the AKI resolves.  Patient referred to home health for assistance and strengthening.  Patient continues to have residual fatigue, needing extra assistance with routine ADLs and tasks around the  house. - Ambulatory referral to [place]  2. Chronic heart failure with preserved ejection fraction (HCC) Cardiac stress test ordered and overnight pulse oximetry, after reading echocardiogram and revealing the patient's results and - Myocardial Perfusion Imaging; Future - Pulse oximetry, overnight; Future - Ambulatory referral to [place]  3. Type 2 diabetes mellitus with hyperglycemia, without long-term current use of insulin (HCC) Continue current medications as prescribed, patient referred to home health - Ambulatory referral to [place]  4. Stage 3b chronic kidney disease ([place]) Repeat metabolic panel kidney function for stability, patient referred to home health - CMP14+EGFR - Ambulatory referral to [place]  5. Thrombocytopenia (HCC) Repeat platelet level to reassess thrombocytopenia, patient had increased risk of  internal bleeding due to low platelet counts. - Platelet count - Ambulatory referral to Home Health  6. Pustular rash Small area of cluster of pustules on her right buttocks, patient has never been tested for HSV.  Lab ordered. - HSV(herpes simplex vrs) 1+2 ab-IgG  7. Other fatigue Continue fatigue that is residual and - Ambulatory referral to [place]    Reason for frequent admissions/ER visits    type 2 DM HTN Asthma Hypertension Congestive heart failure.    Objective:   Vitals:   06/04/21 1007  BP: 138/67  Pulse: 63  Resp: 16  Temp: 98.2 F (36.8 C)  SpO2: 90%  Weight: 223 lb 3.2 oz (101.2 kg)  Height: 5' 3" (1.6 m)    Wt Readings from Last 3 Encounters:  06/04/21 223 lb 3.2 oz (101.2 kg)  04/01/21 229 lb (103.9 kg)  03/24/21 223 lb 6.4 oz (101.3 kg)    Allergies as of 06/04/2021       Reactions   Ramipril Cough, Other (See Comments)   Other reaction(s): Cough Other reaction(s): Cough Other reaction(s): Other (See Comments) Other reaction(s): Cough Other reaction(s): Cough Other reaction(s): Cough        Medication List        Accurate as of Jun 04, 2021 11:16 AM. If you have any questions, ask your nurse or doctor.          acetaminophen 325 MG tablet Commonly known as: TYLENOL Take 650 mg by mouth every 6 (six) hours as needed.   allopurinol 300 MG tablet Commonly known as: ZYLOPRIM Take 1 tablet (300 mg total) by mouth daily. What changed:  when to take this additional instructions   AMBULATORY NON FORMULARY MEDICATION Medication Name: geni kot natural veggie laxitive   aspirin EC 325 MG tablet Take 325 mg by mouth.   atorvastatin 10 MG tablet Commonly known as: LIPITOR Take 1 tablet (10 mg total) by mouth daily.   betamethasone dipropionate 0.05 % cream Apply topically 2 (two) times daily.   bisoprolol-hydrochlorothiazide 5-6.25 MG tablet Commonly known as: ZIAC Take 1 tablet by mouth daily.   cinacalcet 30 MG  tablet Commonly known as: SENSIPAR   citalopram 10 MG tablet Commonly known as: CELEXA Take 1 tablet (10 mg total) by mouth daily.   FISH OIL PO Take by mouth.   fluticasone-salmeterol 100-50 MCG/ACT Aepb Commonly known as: ADVAIR Inhale 1 puff into the lungs 2 (two) times daily.   folic acid 1 MG tablet Commonly known as: FOLVITE Take 1 tablet (1 mg total) by mouth daily.   furosemide 40 MG tablet Commonly known as: LASIX TAKE 1 TABLET BY MOUTH ONCE DAILY AND  TAKE  XTRA  TAB  IF  NEEDED   Garlic 3491 MG Caps Take  by mouth.   glimepiride 1 MG tablet Commonly known as: AMARYL Take 1 tablet (1 mg total) by mouth daily.   hydrALAZINE 25 MG tablet Commonly known as: APRESOLINE Take three times a day   HYDROcodone-acetaminophen 5-325 MG tablet Commonly known as: NORCO/VICODIN Take 1 tablet by mouth every 6 (six) hours as needed for moderate pain or severe pain.   ketoconazole 2 % shampoo Commonly known as: NIZORAL SHAMPOO AS DIRECTED   latanoprost 0.005 % ophthalmic solution Commonly known as: XALATAN latanoprost 0.005 % eye drops  INSTILL 1 DROP INTO EACH EYE IN THE EVENING   losartan 25 MG tablet Commonly known as: COZAAR Patient takes 1/2 tab daily   meloxicam 7.5 MG tablet Commonly known as: MOBIC TAKE 1 TABLET BY MOUTH ONCE DAILY FOR  GOUT  AND  ARTHRITIS   mirabegron ER 50 MG Tb24 tablet Commonly known as: MYRBETRIQ Take 1 tablet (50 mg total) by mouth daily.   montelukast 10 MG tablet Commonly known as: SINGULAIR Take 1 tablet (10 mg total) by mouth daily.   OneTouch Delica Lancets 66Q Misc Use as directed once a daily DxE11.65   OneTouch Verio Reflect w/Device Kit Use as directed for once daily Dx E11.65   OneTouch Verio test strip Generic drug: glucose blood Use as directed once a daily   Pen Needles 32G X 4 MM Misc 1 Units by Does not apply route daily. Use with victoza injections daily.   pregabalin 25 MG capsule Commonly known as:  Lyrica Take 1 capsule (25 mg total) by mouth 2 (two) times daily.   senna 8.6 MG Tabs tablet Commonly known as: SENOKOT Take 1 tablet by mouth.   Sodium Sulfacetamide 10 % Sham Apply topically.   tizanidine 2 MG capsule Commonly known as: Zanaflex Take one tab po qhs for spasm   traZODone 50 MG tablet Commonly known as: DESYREL Take 0.5-1 tablets (25-50 mg total) by mouth at bedtime as needed for sleep.   Victoza 18 MG/3ML Sopn Generic drug: liraglutide INJECT 1.8 MG ONCE DAILY SUBCUTANEOUSLY   Vitamin D (Ergocalciferol) 1.25 MG (50000 UNIT) Caps capsule Commonly known as: DRISDOL Take 1 cap once a week   Vitamin D3 1.25 MG (50000 UT) Caps Take by mouth daily.         Physical Exam: Constitutional: Patient appears well-developed and well-nourished. Not in obvious distress. HENT: Normocephalic, atraumatic, External right and left ear normal. Oropharynx is clear and moist.  Eyes: Conjunctivae and EOM are normal. PERRLA, no scleral icterus. Neck: Normal ROM. Neck supple. No JVD. No tracheal deviation. No thyromegaly. CVS: RRR, S1/S2 +, no murmurs, no gallops, no carotid bruit.  Pulmonary: Effort and breath sounds normal, no stridor, rhonchi, wheezes, rales.  Abdominal: Soft. BS +, no distension, tenderness, rebound or guarding.  Musculoskeletal: Normal range of motion. No edema and no tenderness.  Lymphadenopathy: No lymphadenopathy noted, cervical, inguinal or axillary Neuro: Alert. Normal reflexes, muscle tone coordination. No cranial nerve deficit. Skin: Skin is warm and dry. No rash noted. Not diaphoretic. No erythema. No pallor. Psychiatric: Normal mood and affect. Behavior, judgment, thought content normal.   Data Review   Micro Results No results found for this or any previous visit (from the past 240 hour(s)).   CBC No results for input(s): WBC, HGB, HCT, PLT, MCV, MCH, MCHC, RDW, LYMPHSABS, MONOABS, EOSABS, BASOSABS, BANDABS in the last 168  hours.  Invalid input(s): NEUTRABS, BANDSABD  Chemistries  No results for input(s): NA, K, CL, CO2, GLUCOSE, BUN, CREATININE,  CALCIUM, MG, AST, ALT, ALKPHOS, BILITOT in the last 168 hours.  Invalid input(s): GFRCGP ------------------------------------------------------------------------------------------------------------------ CrCl cannot be calculated (Patient's most recent lab result is older than the maximum 21 days allowed.). ------------------------------------------------------------------------------------------------------------------ No results for input(s): HGBA1C in the last 72 hours. ------------------------------------------------------------------------------------------------------------------ No results for input(s): CHOL, HDL, LDLCALC, TRIG, CHOLHDL, LDLDIRECT in the last 72 hours. ------------------------------------------------------------------------------------------------------------------ No results for input(s): TSH, T4TOTAL, T3FREE, THYROIDAB in the last 72 hours.  Invalid input(s): FREET3 ------------------------------------------------------------------------------------------------------------------ No results for input(s): VITAMINB12, FOLATE, FERRITIN, TIBC, IRON, RETICCTPCT in the last 72 hours.  Coagulation profile No results for input(s): INR, PROTIME in the last 168 hours.  No results for input(s): DDIMER in the last 72 hours.  Cardiac Enzymes No results for input(s): CKMB, TROPONINI, MYOGLOBIN in the last 168 hours.  Invalid input(s): CK ------------------------------------------------------------------------------------------------------------------ Invalid input(s): POCBNP  Return in about 4 weeks (around 07/02/2021) for please reschedule 6/6 appt. .   Time Spent in minutes  45 Time spent with patient included reviewing progress notes, labs, imaging studies, and discussing plan for follow up.  [place] Controlled Substance Database was reviewed by me  for overdose risk score (ORS)   This patient was seen by Jonetta Osgood, FNP-C in collaboration with Dr. Clayborn Bigness as a part of collaborative care agreement.    Jonetta Osgood M.D on 06/04/2021 at 11:16 AM   **Disclaimer: This note may have been dictated with voice recognition software. Similar sounding words can inadvertently be transcribed and this note may contain transcription errors which may not have been corrected upon publication of note.**

## 2021-06-08 ENCOUNTER — Telehealth: Payer: Self-pay

## 2021-06-08 NOTE — Telephone Encounter (Signed)
Sent community message to [place] with [facility] for pt to be set up for a ONO with CPAP on

## 2021-06-11 ENCOUNTER — Encounter: Payer: Self-pay | Admitting: Internal Medicine

## 2021-06-11 ENCOUNTER — Encounter: Payer: Self-pay | Admitting: Nurse Practitioner

## 2021-06-11 DIAGNOSIS — I5032 Chronic diastolic (congestive) heart failure: Secondary | ICD-10-CM | POA: Insufficient documentation

## 2021-06-11 DIAGNOSIS — D696 Thrombocytopenia, unspecified: Secondary | ICD-10-CM | POA: Insufficient documentation

## 2021-06-16 DIAGNOSIS — D351 Benign neoplasm of parathyroid gland: Secondary | ICD-10-CM | POA: Diagnosis not present

## 2021-06-16 DIAGNOSIS — D696 Thrombocytopenia, unspecified: Secondary | ICD-10-CM | POA: Diagnosis not present

## 2021-06-16 DIAGNOSIS — L080 Pyoderma: Secondary | ICD-10-CM | POA: Diagnosis not present

## 2021-06-16 DIAGNOSIS — E559 Vitamin D deficiency, unspecified: Secondary | ICD-10-CM | POA: Diagnosis not present

## 2021-06-16 DIAGNOSIS — N184 Chronic kidney disease, stage 4 (severe): Secondary | ICD-10-CM | POA: Diagnosis not present

## 2021-06-16 DIAGNOSIS — E785 Hyperlipidemia, unspecified: Secondary | ICD-10-CM | POA: Diagnosis not present

## 2021-06-16 DIAGNOSIS — I10 Essential (primary) hypertension: Secondary | ICD-10-CM | POA: Diagnosis not present

## 2021-06-16 DIAGNOSIS — E1165 Type 2 diabetes mellitus with hyperglycemia: Secondary | ICD-10-CM | POA: Diagnosis not present

## 2021-06-16 DIAGNOSIS — N1832 Chronic kidney disease, stage 3b: Secondary | ICD-10-CM | POA: Diagnosis not present

## 2021-06-17 ENCOUNTER — Telehealth: Payer: Self-pay

## 2021-06-17 ENCOUNTER — Ambulatory Visit: Payer: HMO | Admitting: Nurse Practitioner

## 2021-06-17 LAB — CMP14+EGFR
ALT: 9 IU/L (ref 0–32)
AST: 9 IU/L (ref 0–40)
Albumin/Globulin Ratio: 2 (ref 1.2–2.2)
Albumin: 4 g/dL (ref 3.7–4.7)
Alkaline Phosphatase: 65 IU/L (ref 44–121)
BUN/Creatinine Ratio: 15 (ref 12–28)
BUN: 29 mg/dL — ABNORMAL HIGH (ref 8–27)
Bilirubin Total: 0.6 mg/dL (ref 0.0–1.2)
CO2: 23 mmol/L (ref 20–29)
Calcium: 10.6 mg/dL — ABNORMAL HIGH (ref 8.7–10.3)
Chloride: 102 mmol/L (ref 96–106)
Creatinine, Ser: 1.9 mg/dL — ABNORMAL HIGH (ref 0.57–1.00)
Globulin, Total: 2 g/dL (ref 1.5–4.5)
Glucose: 103 mg/dL — ABNORMAL HIGH (ref 70–99)
Potassium: 4 mmol/L (ref 3.5–5.2)
Sodium: 142 mmol/L (ref 134–144)
Total Protein: 6 g/dL (ref 6.0–8.5)
eGFR: 27 mL/min/{1.73_m2} — ABNORMAL LOW (ref >59)

## 2021-06-17 LAB — PLATELET COUNT: Platelets: 148 10*3/uL — ABNORMAL LOW (ref 150–450)

## 2021-06-17 LAB — HSV(HERPES SIMPLEX VRS) I + II AB-IGG
HSV 1 Glycoprotein G Ab, IgG: 39.6 index — ABNORMAL HIGH (ref 0.00–0.90)
HSV 2 IgG, Type Spec: >23.6 index — ABNORMAL HIGH (ref 0.00–0.90)

## 2021-06-17 NOTE — Telephone Encounter (Signed)
Send message to [facility] that we put order for home health

## 2021-06-18 ENCOUNTER — Telehealth: Payer: Self-pay

## 2021-06-18 DIAGNOSIS — Z79899 Other long term (current) drug therapy: Secondary | ICD-10-CM

## 2021-06-18 NOTE — Telephone Encounter (Signed)
[facility] will take her referral

## 2021-06-18 NOTE — Progress Notes (Signed)
[name])  [facility]    06/18/2021  Frances Greene 21-Jun-1945 [id number]  Reason for referral: Medication Review  Referral source: [facility] RN Current insurance: [employer]  PMHx includes but not limited to:  Frances Greene is a 2 YOF with a h/o type 2 diabetes mellitus, CKD, HTN, and CHF.  Outreach:  Successful telephone call with Frances Greene.  I was unable to complete HIPAA identifiers.   Subjective:  I called patient last Friday and she said that she was taking a friend to an appointment. Today when I called she states that she was at a doctors appointment.     Objective: The ASCVD Risk score (Arnett DK, et al., 2019) failed to calculate for the following reasons:   The valid total cholesterol range is 130 to 320 mg/dL  Lab Results  Component Value Date   CREATININE 1.90 (H) 06/16/2021   CREATININE 1.64 (H) 04/01/2021   CREATININE 1.61 (H) 12/03/2020    Lab Results  Component Value Date   HGBA1C 5.8 (A) 03/24/2021    Lipid Panel     Component Value Date/Time   CHOL 129 08/24/2019 1502   TRIG 84 08/24/2019 1502   HDL 54 08/24/2019 1502   LDLCALC 59 08/24/2019 1502    BP Readings from Last 3 Encounters:  06/04/21 138/67  04/01/21 (!) 163/76  03/24/21 (!) 150/66    Allergies  Allergen Reactions   Ramipril Cough and Other (See Comments)    Other reaction(s): Cough Other reaction(s): Cough Other reaction(s): Other (See Comments) Other reaction(s): Cough Other reaction(s): Cough Other reaction(s): Cough     Medications List (have not reviewed with patient)   Medication  Taking? Sig Notes prior to pt call  acetaminophen (TYLENOL) 325 MG tablet  Take 650 mg by mouth every 6 (six) hours as needed.   allopurinol (ZYLOPRIM) 300 MG tablet   Take 1 tablet (300 mg total) by mouth daily.     - CrCl 20-40 manufacturer recommend 100-150 mg/day  --Prior documentation: Patient taking differently: Take 300 mg by  mouth. Take 1 tab po every other day  aspirin EC 325 MG tablet  Take 325 mg by mouth. - Per [facility] discharge summary: discontinued   atorvastatin (LIPITOR) 10 MG tablet  Take 1 tablet (10 mg total) by mouth daily.   betamethasone dipropionate 0.05 % cream  Apply topically 2 (two) times daily.   bisoprolol-hydrochlorothiazide (ZIAC) 5-6.25 MG tablet -  Take 1 tablet by mouth daily. -HCTZ not recommended in patients with CrCL < 30 - Per pharmacy claims, patient also has bisoprolol monotherapy recently filled. Will need to verify if patient is taking both  - Per [facility] discharge summary: discontinued  Blood Glucose Monitoring Suppl (ONETOUCH VERIO REFLECT) w/Device KIT  Use as directed for once daily Dx E11.65   Cholecalciferol (VITAMIN D3) 1.25 MG (50000 UT) CAPS  Take by mouth daily. - Per [facility] discharge summary: discontinued  cinacalcet (SENSIPAR) 30 MG tablet     citalopram (CELEXA) 10 MG tablet  Take 1 tablet (10 mg total) by mouth daily.   fluticasone-salmeterol (ADVAIR) 100-50 MCG/ACT AEPB  Inhale 1 puff into the lungs 2 (two) times daily.   folic acid (FOLVITE) 1 MG tablet  Take 1 tablet (1 mg total) by mouth daily.   furosemide (LASIX) 40 MG tablet  TAKE 1 TABLET BY MOUTH ONCE DAILY AND  TAKE  XTRA  TAB  IF  NEEDED -Lasix 80 mg on file in Care  Everywhere, will need to verify dose  - Per [facility] discharge summary: discontinued  Garlic 8127 MG CAPS  Take by mouth.   glimepiride (AMARYL) 1 MG tablet  Take 1 tablet (1 mg total) by mouth daily. - Per [facility] discharge summary: discontinued  glucose blood (ONETOUCH VERIO) test strip  Use as directed once a daily   hydrALAZINE (APRESOLINE) 25 MG tablet  Take three times a day   HYDROcodone-acetaminophen (NORCO/VICODIN) 5-325 MG tablet  Take 1 tablet by mouth every 6 (six) hours as needed for moderate pain or severe pain.   Insulin Pen Needle (PEN NEEDLES) 32G X 4 MM MISC  1 Units by Does not apply route daily. Use with victoza injections daily.   ketoconazole  (NIZORAL) 2 % shampoo  SHAMPOO AS DIRECTED   latanoprost (XALATAN) 0.005 % ophthalmic solution  latanoprost 0.005 % eye drops  INSTILL 1 DROP INTO EACH EYE IN THE EVENING   liraglutide (VICTOZA) 18 MG/3ML SOPN  INJECT 1.8 MG ONCE DAILY SUBCUTANEOUSLY   losartan (COZAAR) 25 MG tablet  Patient takes 1/2 tab daily - Per [facility] discharge summary: discontinued  meloxicam (MOBIC) 7.5 MG tablet  TAKE 1 TABLET BY MOUTH ONCE DAILY FOR  GOUT  AND  ARTHRITIS - Per [facility] discharge summary: discontinued  mirabegron ER (MYRBETRIQ) 50 MG TB24 tablet  Take 1 tablet (50 mg total) by mouth daily. - Max recommended dose of 25 mg/day in patients w/ eGFR 15-29. Historically, patient's eGFR has been low to mid 30s  montelukast (SINGULAIR) 10 MG tablet  Take 1 tablet (10 mg total) by mouth daily.   Omega-3 Fatty Acids (FISH OIL PO)  Take by mouth.   OneTouch Delica Lancets 51Z MISC  Use as directed once a daily DxE11.65   pregabalin (LYRICA) 25 MG capsule  Take 1 capsule (25 mg total) by mouth 2 (two) times daily. For CrCl 15 to 30 mL/min recommended by mfr: 25 to 150 mg/day given once daily or in 2 divided doses   senna (SENOKOT) 8.6 MG TABS tablet  Take 1 tablet by mouth.   Sulfacetamide Sodium (SODIUM SULFACETAMIDE) 10 % SHAM  Apply topically.   tizanidine (ZANAFLEX) 2 MG capsule  Take one tab po qhs for spasm   traZODone (DESYREL) 50 MG tablet  Take 0.5-1 tablets (25-50 mg total) by mouth at bedtime as needed for sleep.   Vitamin D, Ergocalciferol, (DRISDOL) 1.25 MG (50000 UNIT) CAPS capsule  Take 1 cap once a week - patient on vitamin D3 already on patient list          Assessment: Patient was outreached twice and seems to be unavailable each time for medication review. There are several medications I wanted to clarify with patient that were discontinued at discharge from [facility] on 05/29/2021: risedronate, tramadol, cyclobenzaprine (pt completed therapy), aspirin 325 mg, and Ziac.   There are a few medications  (pregabalin, Myrbetriq,and allopurinol) that may need renal dose adjustments given kidney function trends.  Next appointment with PCP is 07/02/2021.  Plan: Will reach out  to PCP regarding inability to complete comprehensive medication review.  Will contact patient next week for medication review.  If deemed clinically appropriate, please assess and consider renal dose adjustments for allopurinol, Myrbetriq, and pregabalin If pharmacist is unable to reach patient by next office visit, please verify current medication based on last discharge summary from [facility] on 05/29/2021.   Thank you for allowing pharmacy to be a part of this patient's care.   Kristeen Miss, PharmD  [place] Cell: [phone]

## 2021-06-20 DIAGNOSIS — G4733 Obstructive sleep apnea (adult) (pediatric): Secondary | ICD-10-CM | POA: Diagnosis not present

## 2021-06-23 ENCOUNTER — Ambulatory Visit: Payer: HMO | Admitting: Nurse Practitioner

## 2021-06-24 ENCOUNTER — Ambulatory Visit (INDEPENDENT_AMBULATORY_CARE_PROVIDER_SITE_OTHER): Payer: HMO

## 2021-06-24 DIAGNOSIS — G4730 Sleep apnea, unspecified: Secondary | ICD-10-CM | POA: Diagnosis not present

## 2021-06-24 DIAGNOSIS — E1159 Type 2 diabetes mellitus with other circulatory complications: Secondary | ICD-10-CM | POA: Diagnosis not present

## 2021-06-24 DIAGNOSIS — Z6839 Body mass index (BMI) 39.0-39.9, adult: Secondary | ICD-10-CM | POA: Diagnosis not present

## 2021-06-24 DIAGNOSIS — D638 Anemia in other chronic diseases classified elsewhere: Secondary | ICD-10-CM | POA: Diagnosis not present

## 2021-06-24 DIAGNOSIS — M80022G Age-related osteoporosis with current pathological fracture, left humerus, subsequent encounter for fracture with delayed healing: Secondary | ICD-10-CM | POA: Diagnosis not present

## 2021-06-24 DIAGNOSIS — E785 Hyperlipidemia, unspecified: Secondary | ICD-10-CM | POA: Diagnosis not present

## 2021-06-24 DIAGNOSIS — N184 Chronic kidney disease, stage 4 (severe): Secondary | ICD-10-CM | POA: Diagnosis not present

## 2021-06-24 DIAGNOSIS — D351 Benign neoplasm of parathyroid gland: Secondary | ICD-10-CM | POA: Diagnosis not present

## 2021-06-24 DIAGNOSIS — E559 Vitamin D deficiency, unspecified: Secondary | ICD-10-CM | POA: Diagnosis not present

## 2021-06-24 DIAGNOSIS — I10 Essential (primary) hypertension: Secondary | ICD-10-CM | POA: Diagnosis not present

## 2021-06-24 DIAGNOSIS — G4733 Obstructive sleep apnea (adult) (pediatric): Secondary | ICD-10-CM | POA: Diagnosis not present

## 2021-06-24 DIAGNOSIS — E6609 Other obesity due to excess calories: Secondary | ICD-10-CM | POA: Diagnosis not present

## 2021-06-24 NOTE — Progress Notes (Signed)
95 percentile pressure 8.2   95th percentile leak 11.4    apnea-hypopnea index  0.9 /hr   total days used  >4 hr 27 days  total days used <4 hr 30 days  Total compliance 90 percent  She is doing great on cpap no problems or questions. She did have question about bill advised to call office.  Pt was seen by Claiborne Billings  RRT/RCP  from [facility]

## 2021-06-26 ENCOUNTER — Telehealth: Payer: Self-pay

## 2021-06-26 ENCOUNTER — Other Ambulatory Visit: Payer: Self-pay | Admitting: Nurse Practitioner

## 2021-06-26 MED ORDER — FUROSEMIDE 80 MG PO TABS
80.0000 mg | ORAL_TABLET | Freq: Every morning | ORAL | 1 refills | Status: DC
Start: 2021-06-26 — End: 2021-11-13

## 2021-06-26 MED ORDER — FEBUXOSTAT 40 MG PO TABS: 40.0000 mg | ORAL_TABLET | Freq: Every day | ORAL | 1 refills | Status: DC

## 2021-06-26 MED ORDER — EMPAGLIFLOZIN 10 MG PO TABS
10.0000 mg | ORAL_TABLET | Freq: Every day | ORAL | 1 refills | Status: DC
Start: 2021-06-26 — End: 2021-11-13

## 2021-06-26 MED ORDER — BISOPROLOL FUMARATE 5 MG PO TABS: 5.0000 mg | ORAL_TABLET | Freq: Every day | ORAL | 1 refills | Status: DC

## 2021-06-26 NOTE — Telephone Encounter (Signed)
Frances Greene change med and send to phar and advised pt to bring all her med and discharge summary

## 2021-06-29 ENCOUNTER — Telehealth: Payer: Self-pay

## 2021-06-29 DIAGNOSIS — Z79899 Other long term (current) drug therapy: Secondary | ICD-10-CM

## 2021-06-29 NOTE — Progress Notes (Signed)
[facility])  Frances Greene Team    06/29/2021  Frances Greene 19-Nov-1945 [id number]  Reason for referral: Medication Review  Referral source: [facility] RN Current insurance: [employer]  PMHx includes but not limited to:  Frances Greene is a 69 YOF with a h/o type 2 diabetes mellitus, CKD, HTN, and CHF.  Outreach:  Successful telephone call with Frances Greene.  I was unable to complete HIPAA identifiers.   Subjective:  I called patient last Friday and she said that she was taking a friend to an appointment. Today when I called she states that she was at a doctors appointment.     Objective: The ASCVD Risk score (Arnett DK, et al., 2019) failed to calculate for the following reasons:   The valid total cholesterol range is 130 to 320 mg/dL  Lab Results  Component Value Date   CREATININE 1.90 (H) 06/16/2021   CREATININE 1.64 (H) 04/01/2021   CREATININE 1.61 (H) 12/03/2020    Lab Results  Component Value Date   HGBA1C 5.8 (A) 03/24/2021    Lipid Panel     Component Value Date/Time   CHOL 129 08/24/2019 1502   TRIG 84 08/24/2019 1502   HDL 54 08/24/2019 1502   LDLCALC 59 08/24/2019 1502    BP Readings from Last 3 Encounters:  06/04/21 138/67  04/01/21 (!) 163/76  03/24/21 (!) 150/66    Allergies  Allergen Reactions   Ramipril Cough and Other (See Comments)    Other reaction(s): Cough Other reaction(s): Cough Other reaction(s): Other (See Comments) Other reaction(s): Cough Other reaction(s): Cough Other reaction(s): Cough     Medications List (have not reviewed with patient)   Medication  Taking? Sig Notes prior /during patient call  acetaminophen (TYLENOL) 325 MG tablet No  Take 650 mg by mouth every 6 (six) hours as needed.   allopurinol (ZYLOPRIM) 300 MG tablet  Yes - she told this morning medication was changed to Uloric Take 1 tablet (300 mg total) by mouth daily.     - CrCl 20-40 manufacturer recommend 100-150  mg/day; already notified PCP about renal dose adjustment -------------------- - Patient now prescribed Uloric and she has not picked up medication yet. Per manufacturer, max recommended dose is 40 mg/day for CrCl 15-29 mL/min.    aspirin EC 325 MG tablet No - Stopped Take 325 mg by mouth. - Per [facility] discharge summary: discontinued   atorvastatin (LIPITOR) 10 MG tablet Yes  Take 1 tablet (10 mg total) by mouth daily.   betamethasone dipropionate 0.05 % cream Yes - PRN once daily  Apply topically 2 (two) times daily.   bisoprolol-hydrochlorothiazide (ZIAC) 5-6.25 MG tablet - No -Stopped  Take 1 tablet by mouth daily. -HCTZ not recommended in patients with CrCL < 30, per manufacturer - Per pharmacy claims, patient also has bisoprolol monotherapy recently filled. Will need to verify if patient is taking both  ------------------- - Per [facility] discharge summary: discontinued  She now takes Bisoprolol 5 mg  daily  Blood Glucose Monitoring Suppl (ONETOUCH VERIO REFLECT) w/Device KIT  Use as directed for once daily Dx E11.65   Cholecalciferol (VITAMIN D3) 1.25 MG (50000 UT) CAPS Yes - takes in addition to once weekly; "no one told me to make any changes" Take by mouth daily. - Duplicate therapy  cinacalcet (SENSIPAR) 30 MG tablet Yes   Take 1 tablet (30 mg total) by mouth daily     citalopram (CELEXA) 10 MG tablet Yes  Take 1 tablet (  10 mg total) by mouth daily.   fluticasone-salmeterol (ADVAIR) 100-50 MCG/ACT AEPB Yes - PRN (she has not used since last discharge from hospital) Inhale 1 puff into the lungs 2 (two) times daily. - [name] denied feelings of SOB  folic acid (FOLVITE) 1 MG tablet Yes  Take 1 tablet (1 mg total) by mouth daily.   furosemide (LASIX) 40 MG tablet No - stopped; takes 80 mg daily now TAKE 1 TABLET BY MOUTH ONCE DAILY AND  TAKE  XTRA  TAB  IF  NEEDED -Lasix 80 mg on file in Care Everywhere, will need to verify dose  -------------------- - Per [facility] discharge summary:  discontinued  -She checks weight daily but denies wt gain of 2-3 lbs in a day; only once >1 lbs in a day since d/c in May  Garlic 7680 MG CAPS Yes  Take by mouth daily.   glimepiride (AMARYL) 1 MG tablet No - per patient  she takes PRN  when BG is >140; only took medication about 3 times since d/c from [facility]. She has modified to PRN dosing d/t uncertainty since discharge. Educated why medication was stopped and advised to talk to PCP. Take 1 tablet (1 mg total) by mouth daily. - Per [facility] discharge summary: discontinued ----------------- - per discussion with patient, BG has mostly been <140  and denied sxs of hypoglycemia . Drinking more water per provider instructions.   glucose blood (ONETOUCH VERIO) test strip  Use as directed once a daily   hydrALAZINE (APRESOLINE) 25 MG tablet Yes  Take one tablet three times a day   HYDROcodone-acetaminophen (NORCO/VICODIN) 5-325 MG tablet Yes- PRN  Take 1 tablet by mouth every 6 (six) hours as needed for moderate pain or severe pain.   Insulin Pen Needle (PEN NEEDLES) 32G X 4 MM MISC  1 Units by Does not apply route daily. Use with victoza injections daily.   ketoconazole (NIZORAL) 2 % shampoo Yes - PRN  SHAMPOO AS DIRECTED   latanoprost (XALATAN) 0.005 % ophthalmic solution Yes  latanoprost 0.005 % eye drops  INSTILL 1 DROP INTO EACH EYE IN THE EVENING   liraglutide (VICTOZA) 18 MG/3ML SOPN Yes  INJECT 1.8 MG ONCE DAILY SUBCUTANEOUSLY   losartan (COZAAR) 25 MG tablet No - stopped discharged  Patient takes 1/2 tab daily - Per [facility] discharge summary: discontinued -She have not checked BP  meloxicam (MOBIC) 7.5 MG tablet She is not sure if she is taking medication TAKE 1 TABLET BY MOUTH ONCE DAILY FOR  GOUT  AND  ARTHRITIS - Per [facility] discharge summary: discontinued  mirabegron ER (MYRBETRIQ) 50 MG TB24 tablet No - patient reports didn't work and stopped the medication  Take 1 tablet (50 mg total) by mouth daily. - Max recommended dose of 25 mg/day in patients w/  eGFR 15-29. Historically, patient's eGFR has been low to mid 30s  montelukast (SINGULAIR) 10 MG tablet Yes Take 1 tablet (10 mg total) by mouth daily.   Omega-3 Fatty Acids (FISH OIL PO) Yes  Take by mouth daily.   OneTouch Delica Lancets 88P MISC  Use as directed once a daily    pregabalin (LYRICA) 25 MG capsule yes Take 1 capsule (25 mg total) by mouth 2 (two) times daily. Manufacturer recommends for CrCl 15 to 30 mL/min 25 to 150 mg/day given once daily or in 2 divided doses   senna (SENOKOT) 8.6 MG TABS tablet Yes  Take 1 tablet by mouth.   Sulfacetamide Sodium (SODIUM SULFACETAMIDE) 10 %  SHAM Yes  Apply topically.   tizanidine (ZANAFLEX) 2 MG capsule Yes - PRN  Take one tablet by mouth at bedtime for spasm   traZODone (DESYREL) 50 MG tablet No  Take 0.5-1 tablets (25-50 mg total) by mouth at bedtime as needed for sleep.   Vitamin D, Ergocalciferol, (DRISDOL) 1.25 MG (50000 UNIT) CAPS capsule Yes  Take 1 cap once a week - patient on vitamin D3 already on patient list    - Per [facility] discharge summary: discontinued ------ -------- -discussed with patient about patient re: use of one vitamin D since no updated level   Patient reported the following medications:      benadryl HCI 25 mg PRN  Vitamin B12 1000 mg - One tablet  daily  Iron 65 mg  (325 mg ferrous sulfate)-  One tablet  daily  Vitamin B3 25 mcg  (1000 IU )-  One tablet  daily    Patient reports adherence to all medications.   Assessment: Patient reports stopping most medications at the last discharge except glimepiride and ergocalciferol. Given patient reported BG at home and recent labs, she may not need glimepiride at this time. H/o of CKD and no vitamin D level on file. This pharmacist have already reached out to PCP re: renal dose adjustments.   Next appointment with PCP is 07/02/2021.  Plan: Will reach out  to PCP regarding assessing need for ergocalciferol once weekly and glimepiride. Patient is no longer taking Mybetriq    Thank you for allowing pharmacy to be a part of this patient's care.   Kristeen Miss, PharmD Clinical Pharmacist [place] Cell: [phone]

## 2021-06-30 DIAGNOSIS — I10 Essential (primary) hypertension: Secondary | ICD-10-CM | POA: Diagnosis not present

## 2021-06-30 DIAGNOSIS — N1832 Chronic kidney disease, stage 3b: Secondary | ICD-10-CM | POA: Diagnosis not present

## 2021-06-30 DIAGNOSIS — E1122 Type 2 diabetes mellitus with diabetic chronic kidney disease: Secondary | ICD-10-CM | POA: Diagnosis not present

## 2021-06-30 DIAGNOSIS — R809 Proteinuria, unspecified: Secondary | ICD-10-CM | POA: Diagnosis not present

## 2021-06-30 DIAGNOSIS — I5032 Chronic diastolic (congestive) heart failure: Secondary | ICD-10-CM | POA: Diagnosis not present

## 2021-06-30 DIAGNOSIS — E210 Primary hyperparathyroidism: Secondary | ICD-10-CM | POA: Diagnosis not present

## 2021-06-30 DIAGNOSIS — D631 Anemia in chronic kidney disease: Secondary | ICD-10-CM | POA: Diagnosis not present

## 2021-06-30 DIAGNOSIS — N184 Chronic kidney disease, stage 4 (severe): Secondary | ICD-10-CM | POA: Diagnosis not present

## 2021-07-02 ENCOUNTER — Encounter: Payer: Self-pay | Admitting: Nurse Practitioner

## 2021-07-02 ENCOUNTER — Ambulatory Visit (INDEPENDENT_AMBULATORY_CARE_PROVIDER_SITE_OTHER): Payer: HMO | Admitting: Internal Medicine

## 2021-07-02 ENCOUNTER — Telehealth (HOSPITAL_COMMUNITY): Payer: Self-pay | Admitting: Nurse Practitioner

## 2021-07-02 VITALS — BP 152/63 | HR 67 | Temp 98.4°F | Resp 16 | Ht 63.0 in | Wt 225.2 lb

## 2021-07-02 DIAGNOSIS — F5101 Primary insomnia: Secondary | ICD-10-CM | POA: Diagnosis not present

## 2021-07-02 DIAGNOSIS — I5032 Chronic diastolic (congestive) heart failure: Secondary | ICD-10-CM | POA: Diagnosis not present

## 2021-07-02 DIAGNOSIS — Z6839 Body mass index (BMI) 39.0-39.9, adult: Secondary | ICD-10-CM | POA: Diagnosis not present

## 2021-07-02 DIAGNOSIS — D631 Anemia in chronic kidney disease: Secondary | ICD-10-CM | POA: Diagnosis not present

## 2021-07-02 DIAGNOSIS — N179 Acute kidney failure, unspecified: Secondary | ICD-10-CM | POA: Diagnosis not present

## 2021-07-02 DIAGNOSIS — G4733 Obstructive sleep apnea (adult) (pediatric): Secondary | ICD-10-CM | POA: Diagnosis not present

## 2021-07-02 DIAGNOSIS — D699 Hemorrhagic condition, unspecified: Secondary | ICD-10-CM | POA: Diagnosis not present

## 2021-07-02 DIAGNOSIS — I10 Essential (primary) hypertension: Secondary | ICD-10-CM

## 2021-07-02 DIAGNOSIS — E1165 Type 2 diabetes mellitus with hyperglycemia: Secondary | ICD-10-CM

## 2021-07-02 DIAGNOSIS — D509 Iron deficiency anemia, unspecified: Secondary | ICD-10-CM | POA: Diagnosis not present

## 2021-07-02 DIAGNOSIS — N1832 Chronic kidney disease, stage 3b: Secondary | ICD-10-CM

## 2021-07-02 DIAGNOSIS — F419 Anxiety disorder, unspecified: Secondary | ICD-10-CM | POA: Diagnosis not present

## 2021-07-02 DIAGNOSIS — M1990 Unspecified osteoarthritis, unspecified site: Secondary | ICD-10-CM | POA: Diagnosis not present

## 2021-07-02 DIAGNOSIS — E782 Mixed hyperlipidemia: Secondary | ICD-10-CM | POA: Diagnosis not present

## 2021-07-02 DIAGNOSIS — I130 Hypertensive heart and chronic kidney disease with heart failure and stage 1 through stage 4 chronic kidney disease, or unspecified chronic kidney disease: Secondary | ICD-10-CM | POA: Diagnosis not present

## 2021-07-02 DIAGNOSIS — M109 Gout, unspecified: Secondary | ICD-10-CM | POA: Diagnosis not present

## 2021-07-02 DIAGNOSIS — L080 Pyoderma: Secondary | ICD-10-CM | POA: Diagnosis not present

## 2021-07-02 DIAGNOSIS — E559 Vitamin D deficiency, unspecified: Secondary | ICD-10-CM | POA: Diagnosis not present

## 2021-07-02 DIAGNOSIS — R32 Unspecified urinary incontinence: Secondary | ICD-10-CM | POA: Diagnosis not present

## 2021-07-02 DIAGNOSIS — E1122 Type 2 diabetes mellitus with diabetic chronic kidney disease: Secondary | ICD-10-CM | POA: Diagnosis not present

## 2021-07-02 DIAGNOSIS — F331 Major depressive disorder, recurrent, moderate: Secondary | ICD-10-CM | POA: Diagnosis not present

## 2021-07-02 DIAGNOSIS — J45909 Unspecified asthma, uncomplicated: Secondary | ICD-10-CM | POA: Diagnosis not present

## 2021-07-02 DIAGNOSIS — M5441 Lumbago with sciatica, right side: Secondary | ICD-10-CM | POA: Diagnosis not present

## 2021-07-02 DIAGNOSIS — E538 Deficiency of other specified B group vitamins: Secondary | ICD-10-CM | POA: Diagnosis not present

## 2021-07-02 LAB — POCT GLYCOSYLATED HEMOGLOBIN (HGB A1C): Hemoglobin A1C: 5.7 % — AB (ref 4.0–5.6)

## 2021-07-02 NOTE — Telephone Encounter (Signed)
Just an FYI. We have made several attempts to contact the Providers office to obtain a Prior Authorization from [employer] for the ordered [place].  We will be removing the patient from the echo/nuc  WQ.   07/02/21 order removed from the Wq due to no response from Providers office for PA# LBW 06/25/21 [place] sent x 3 for PA# and ATT/LBW  06/18/21 Inbasket x 3 sent for ATT and PA#  06/11/21 Inbasket sent for ATT and PA#  06/08/21 CONSENT PENDING AND PA# TO schedule /LBW  CONSENT PENDING AND PA# to schedule     Thank you

## 2021-07-02 NOTE — Progress Notes (Addendum)
[facility] [place], [place] [address]  Internal MEDICINE  Office Visit Note  Patient Name: Frances Greene  [id number]  [id number]  Date of Service: 07/02/2021  Chief Complaint  Patient presents with   Follow-up   Diabetes   Hypertension   Asthma   Results    HPI Patient is here for routine follow-up overall she feels well. Diabetes is very well controlled, she does have arthritic pain in her hands, history of gout as well, she also is complaining of burning in her hands and feet has tried tramadol in the past with no relief. Patient is requesting a new CPAP machine since her previous one is broken it was an order done by Dr. Devona Konig on previous visit will reach out to to [facility]. Blood pressure systolic is slightly elevated however home readings have been within normal limits Patient has chronic kidney disease followed by nephrology her creatinine has been stable  she has previous history of having biopsy done in that area which has been normal and she does not want to have any further diagnostic until her routine mammogram next year  Current Medication: Outpatient Encounter Medications as of 07/02/2021  Medication Sig   acetaminophen (TYLENOL) 325 MG tablet Take 650 mg by mouth every 6 (six) hours as needed.   AMBULATORY NON FORMULARY MEDICATION Medication Name: geni kot natural veggie laxitive   betamethasone dipropionate 0.05 % cream Apply topically 2 (two) times daily.   bisoprolol (ZEBETA) 5 MG tablet Take 1 tablet (5 mg total) by mouth daily.   Blood Glucose Monitoring Suppl (ONETOUCH VERIO REFLECT) w/Device KIT Use as directed for once daily Dx E11.65   Cholecalciferol (VITAMIN D3) 1.25 MG (50000 UT) CAPS Take by mouth daily.   cinacalcet (SENSIPAR) 30 MG tablet    empagliflozin (JARDIANCE) 10 MG TABS tablet Take 1 tablet (10 mg total) by mouth daily.   febuxostat (ULORIC) 40 MG tablet Take 1 tablet (40 mg total) by mouth daily.    fluticasone-salmeterol (ADVAIR) 100-50 MCG/ACT AEPB Inhale 1 puff into the lungs 2 (two) times daily.   furosemide (LASIX) 80 MG tablet Take 1 tablet (80 mg total) by mouth every morning.   Garlic 8469 MG CAPS Take by mouth.   glucose blood (ONETOUCH VERIO) test strip Use as directed once a daily   HYDROcodone-acetaminophen (NORCO/VICODIN) 5-325 MG tablet Take 1 tablet by mouth every 6 (six) hours as needed for moderate pain or severe pain.   Insulin Pen Needle (PEN NEEDLES) 32G X 4 MM MISC 1 Units by Does not apply route daily. Use with victoza injections daily.   ketoconazole (NIZORAL) 2 % shampoo SHAMPOO AS DIRECTED   latanoprost (XALATAN) 0.005 % ophthalmic solution latanoprost 0.005 % eye drops  INSTILL 1 DROP INTO EACH EYE IN THE EVENING   liraglutide (VICTOZA) 18 MG/3ML SOPN INJECT 1.8 MG ONCE DAILY SUBCUTANEOUSLY   meloxicam (MOBIC) 7.5 MG tablet TAKE 1 TABLET BY MOUTH ONCE DAILY FOR  GOUT  AND  ARTHRITIS   mirabegron ER (MYRBETRIQ) 50 MG TB24 tablet Take 1 tablet (50 mg total) by mouth daily.   montelukast (SINGULAIR) 10 MG tablet Take 1 tablet (10 mg total) by mouth daily.   Omega-3 Fatty Acids (FISH OIL PO) Take by mouth.   OneTouch Delica Lancets 62X MISC Use as directed once a daily DxE11.65   pregabalin (LYRICA) 25 MG capsule Take 1 capsule (25 mg total) by mouth 2 (two) times daily.   senna (SENOKOT) 8.6  MG TABS tablet Take 1 tablet by mouth.   Sulfacetamide Sodium (SODIUM SULFACETAMIDE) 10 % SHAM Apply topically.   tizanidine (ZANAFLEX) 2 MG capsule Take one tab po qhs for spasm   traZODone (DESYREL) 50 MG tablet Take 0.5-1 tablets (25-50 mg total) by mouth at bedtime as needed for sleep.   [DISCONTINUED] atorvastatin (LIPITOR) 10 MG tablet Take 1 tablet (10 mg total) by mouth daily.   [DISCONTINUED] citalopram (CELEXA) 10 MG tablet Take 1 tablet (10 mg total) by mouth daily.   [DISCONTINUED] folic acid (FOLVITE) 1 MG tablet Take 1 tablet (1 mg total) by mouth daily.    [DISCONTINUED] hydrALAZINE (APRESOLINE) 25 MG tablet Take three times a day   [DISCONTINUED] Vitamin D, Ergocalciferol, (DRISDOL) 1.25 MG (50000 UNIT) CAPS capsule Take 1 cap once a week   No facility-administered encounter medications on file as of 07/02/2021.    Surgical History: Past Surgical History:  Procedure Laterality Date   ABDOMINAL HYSTERECTOMY     BREAST BIOPSY Left 1989   neg   CATARACT EXTRACTION W/PHACO Right 05/21/2015   Procedure: CATARACT EXTRACTION PHACO AND INTRAOCULAR LENS PLACEMENT (IOC);  Surgeon: Marcelene Butte, MD;  Location: [facility] ORS;  Service: Ophthalmology;  Laterality: Right;  [place]: AP%: 10.0 CDE: 9.53   CATARACT EXTRACTION W/PHACO Left 12/31/2020   Procedure: CATARACT EXTRACTION PHACO AND INTRAOCULAR LENS PLACEMENT (IOC) LEFT DIABETIC 4.13 01:00.9;  Surgeon: Lockie Mola, MD;  Location: [facility];  Service: Ophthalmology;  Laterality: Left;   COLONOSCOPY     JOINT REPLACEMENT Bilateral    knee   PARS PLANA VITRECTOMY Right 05/21/2015   Procedure: PARS PLANA VITRECTOMY WITH 25 GAUGE;  Surgeon: Marcelene Butte, MD;  Location: [facility] ORS;  Service: Ophthalmology;  Laterality: Right;  Lot # [id number] H Endo laser: Watts: 300 Pulse Duration: 200 Total Pulses:143    shoulder surgery      Medical History: Past Medical History:  Diagnosis Date   Arthritis    Asthma    Bronchitis    CHF (congestive heart failure) (HCC)    Chronic kidney disease    decreased kidney function   Diabetes mellitus without complication (HCC)    Gout    Heart murmur    Hypertension    Multiple allergies    Shortness of breath dyspnea    w/ exertion   Sleep apnea    CPAP   Swelling of both lower extremities    Wheezing     Family History: Family History  Problem Relation Age of Onset   Breast cancer Cousin    Breast cancer Other    Hypertension Mother    Diabetes Mother    Hypertension Father    Diabetes Father    Alcohol abuse Father    Aneurysm  Sister    Alcohol abuse Sister    Stroke Sister        died from stroke   Hepatitis Brother    Hypertension Sister        dialysis   Diabetes Sister    Alcoholism Brother    Alcoholism Brother        dies from head injury from a fall.  not an alcoholic,   Cancer Brother    Diabetes Brother    Hypertension Brother    Diabetes Brother    Cancer Brother    Hypertension Brother    Stroke Brother    Alcoholism Brother     Social History   Socioeconomic History   Marital status: Divorced  Spouse name: Not on file   Number of children: Not on file   Years of education: Not on file   Highest education level: Not on file  Occupational History   Not on file  Tobacco Use   Smoking status: Former    Types: Cigarettes   Smokeless tobacco: Never   Tobacco comments:    4 cigarettes quit 20 years ago  Vaping Use   Vaping Use: Never used  Substance and Sexual Activity   Alcohol use: No   Drug use: Never   Sexual activity: Not on file  Other Topics Concern   Not on file  Social History Narrative   Not on file   Social Determinants of Health   Financial Resource Strain: Low Risk  (12/04/2020)   Overall Financial Resource Strain (CARDIA)    Difficulty of Paying Living Expenses: Not very hard  Food Insecurity: Not on file  Transportation Needs: Not on file  Physical Activity: Not on file  Stress: Not on file  Social Connections: Not on file  Intimate Partner Violence: Not on file      Review of Systems  Constitutional:  Negative for chills, fatigue and unexpected weight change.  HENT:  Positive for postnasal drip. Negative for congestion, rhinorrhea, sneezing and sore throat.   Eyes:  Negative for redness.  Respiratory:  Negative for cough, chest tightness and shortness of breath.   Cardiovascular:  Negative for chest pain and palpitations.  Gastrointestinal:  Negative for abdominal pain, constipation, diarrhea, nausea and vomiting.  Genitourinary:  Negative for  dysuria and frequency.  Musculoskeletal:  Negative for arthralgias, back pain, joint swelling and neck pain.  Skin:  Negative for rash.  Neurological:  Positive for numbness. Negative for tremors.       Burning in hand and feet   Hematological:  Negative for adenopathy. Does not bruise/bleed easily.  Psychiatric/Behavioral:  Negative for behavioral problems (Depression), sleep disturbance and suicidal ideas. The patient is not nervous/anxious.     Vital Signs: BP (!) 152/63   Pulse 67   Temp 98.4 F (36.9 C)   Resp 16   Ht $R'5\' 3"'cs$  (1.6 m)   Wt 225 lb 3.2 oz (102.2 kg)   SpO2 97%   BMI 39.89 kg/m    Physical Exam Constitutional:      Appearance: Normal appearance.  HENT:     Head: Normocephalic and atraumatic.     Nose: Nose normal.     Mouth/Throat:     Mouth: Mucous membranes are moist.     Pharynx: No posterior oropharyngeal erythema.  Eyes:     Extraocular Movements: Extraocular movements intact.     Pupils: Pupils are equal, round, and reactive to light.  Cardiovascular:     Pulses:          Dorsalis pedis pulses are 3+ on the right side and 3+ on the left side.       Posterior tibial pulses are 3+ on the right side and 3+ on the left side.     Heart sounds: Normal heart sounds.  Pulmonary:     Effort: Pulmonary effort is normal.     Breath sounds: Normal breath sounds.  Musculoskeletal:     Right foot: Normal range of motion.     Left foot: Normal range of motion.  Feet:     Right foot:     Protective Sensation: 2 sites tested.  2 sites sensed.     Skin integrity: Skin integrity normal.  Toenail Condition: Right toenails are normal.     Left foot:     Protective Sensation: 2 sites tested.  2 sites sensed.     Skin integrity: Skin integrity normal.     Toenail Condition: Left toenails are normal.  Neurological:     General: No focal deficit present.     Mental Status: She is alert.  Psychiatric:        Mood and Affect: Mood normal.        Behavior:  Behavior normal.     Assessment and Plan: 1. Type 2 diabetes mellitus with hyperglycemia, without long-term current use of insulin (HCC) Stable, A1c is 5.7 today.  Continues to improve with each check.  No changes in current medications - POCT glycosylated hemoglobin (Hb A1C)  2. Stage 3b chronic kidney disease ([place]) Followed by nephrology Avoid nephrotoxic medications as able Most recent labs: GFR 27, creatinine 1.9  3. Chronic heart failure with preserved ejection fraction ([place]) Followed by cardiology.  Continue to weigh once daily.  Please follow cardiology's instructions regarding weight gain which is typically concerning at 3 pounds in 1 day or 5 pounds in a week.  4. Essential hypertension, benign Systolic blood pressure is elevated today but has been within normal limits at home         General Counseling: stayce delancy understanding of the findings of todays visit and agrees with plan of treatment. I have discussed any further diagnostic evaluation that may be needed or ordered today. We also reviewed her medications today. she has been encouraged to call the office with any questions or concerns that should arise related to todays visit.    Orders Placed This Encounter  Procedures   POCT glycosylated hemoglobin (Hb A1C)    No orders of the defined types were placed in this encounter.  Return in about 1 month (around 08/01/2021) for F/U with [facility].   Total time spent:35 Minutes Time spent includes review of chart, medications, test results, and follow up plan with the patient.   [place] Controlled Substance Database was reviewed by me.   Dr Lavera Guise Internal medicine

## 2021-07-03 ENCOUNTER — Telehealth: Payer: Self-pay

## 2021-07-03 NOTE — Telephone Encounter (Signed)
Tabitha from [facility] called requesting verbal orders for Nursing frequency  1 x week for 3 weeks 1 PRN I gave verbal ok to do orders

## 2021-07-08 ENCOUNTER — Telehealth: Payer: HMO

## 2021-07-08 ENCOUNTER — Telehealth: Payer: Self-pay

## 2021-07-08 DIAGNOSIS — F5101 Primary insomnia: Secondary | ICD-10-CM | POA: Diagnosis not present

## 2021-07-08 DIAGNOSIS — E782 Mixed hyperlipidemia: Secondary | ICD-10-CM | POA: Diagnosis not present

## 2021-07-08 DIAGNOSIS — D699 Hemorrhagic condition, unspecified: Secondary | ICD-10-CM | POA: Diagnosis not present

## 2021-07-08 DIAGNOSIS — D631 Anemia in chronic kidney disease: Secondary | ICD-10-CM | POA: Diagnosis not present

## 2021-07-08 DIAGNOSIS — R32 Unspecified urinary incontinence: Secondary | ICD-10-CM | POA: Diagnosis not present

## 2021-07-08 DIAGNOSIS — M5441 Lumbago with sciatica, right side: Secondary | ICD-10-CM | POA: Diagnosis not present

## 2021-07-08 DIAGNOSIS — Z6839 Body mass index (BMI) 39.0-39.9, adult: Secondary | ICD-10-CM | POA: Diagnosis not present

## 2021-07-08 DIAGNOSIS — F331 Major depressive disorder, recurrent, moderate: Secondary | ICD-10-CM | POA: Diagnosis not present

## 2021-07-08 DIAGNOSIS — J45909 Unspecified asthma, uncomplicated: Secondary | ICD-10-CM | POA: Diagnosis not present

## 2021-07-08 DIAGNOSIS — M109 Gout, unspecified: Secondary | ICD-10-CM | POA: Diagnosis not present

## 2021-07-08 DIAGNOSIS — E1122 Type 2 diabetes mellitus with diabetic chronic kidney disease: Secondary | ICD-10-CM | POA: Diagnosis not present

## 2021-07-08 DIAGNOSIS — E559 Vitamin D deficiency, unspecified: Secondary | ICD-10-CM | POA: Diagnosis not present

## 2021-07-08 DIAGNOSIS — N1832 Chronic kidney disease, stage 3b: Secondary | ICD-10-CM | POA: Diagnosis not present

## 2021-07-08 DIAGNOSIS — E538 Deficiency of other specified B group vitamins: Secondary | ICD-10-CM | POA: Diagnosis not present

## 2021-07-08 DIAGNOSIS — E1165 Type 2 diabetes mellitus with hyperglycemia: Secondary | ICD-10-CM | POA: Diagnosis not present

## 2021-07-08 DIAGNOSIS — F419 Anxiety disorder, unspecified: Secondary | ICD-10-CM | POA: Diagnosis not present

## 2021-07-08 DIAGNOSIS — D509 Iron deficiency anemia, unspecified: Secondary | ICD-10-CM | POA: Diagnosis not present

## 2021-07-08 DIAGNOSIS — N179 Acute kidney failure, unspecified: Secondary | ICD-10-CM | POA: Diagnosis not present

## 2021-07-08 DIAGNOSIS — G4733 Obstructive sleep apnea (adult) (pediatric): Secondary | ICD-10-CM | POA: Diagnosis not present

## 2021-07-08 DIAGNOSIS — L080 Pyoderma: Secondary | ICD-10-CM | POA: Diagnosis not present

## 2021-07-08 DIAGNOSIS — I130 Hypertensive heart and chronic kidney disease with heart failure and stage 1 through stage 4 chronic kidney disease, or unspecified chronic kidney disease: Secondary | ICD-10-CM | POA: Diagnosis not present

## 2021-07-08 DIAGNOSIS — M1990 Unspecified osteoarthritis, unspecified site: Secondary | ICD-10-CM | POA: Diagnosis not present

## 2021-07-08 DIAGNOSIS — I5032 Chronic diastolic (congestive) heart failure: Secondary | ICD-10-CM | POA: Diagnosis not present

## 2021-07-08 NOTE — Telephone Encounter (Signed)
Gave verbal to connie from [facility]  [phone] for Occupational therapy once a week for 6 week and also gave verbal order to CN([phone]) from [facility] for PT twice a week for 4 week and once a week for 4 weeks

## 2021-07-15 ENCOUNTER — Other Ambulatory Visit: Payer: Self-pay | Admitting: Nurse Practitioner

## 2021-07-15 DIAGNOSIS — J45909 Unspecified asthma, uncomplicated: Secondary | ICD-10-CM | POA: Diagnosis not present

## 2021-07-15 DIAGNOSIS — I130 Hypertensive heart and chronic kidney disease with heart failure and stage 1 through stage 4 chronic kidney disease, or unspecified chronic kidney disease: Secondary | ICD-10-CM | POA: Diagnosis not present

## 2021-07-15 DIAGNOSIS — D631 Anemia in chronic kidney disease: Secondary | ICD-10-CM | POA: Diagnosis not present

## 2021-07-15 DIAGNOSIS — F5101 Primary insomnia: Secondary | ICD-10-CM | POA: Diagnosis not present

## 2021-07-15 DIAGNOSIS — E559 Vitamin D deficiency, unspecified: Secondary | ICD-10-CM | POA: Diagnosis not present

## 2021-07-15 DIAGNOSIS — E538 Deficiency of other specified B group vitamins: Secondary | ICD-10-CM | POA: Diagnosis not present

## 2021-07-15 DIAGNOSIS — M109 Gout, unspecified: Secondary | ICD-10-CM | POA: Diagnosis not present

## 2021-07-15 DIAGNOSIS — F331 Major depressive disorder, recurrent, moderate: Secondary | ICD-10-CM | POA: Diagnosis not present

## 2021-07-15 DIAGNOSIS — G4733 Obstructive sleep apnea (adult) (pediatric): Secondary | ICD-10-CM | POA: Diagnosis not present

## 2021-07-15 DIAGNOSIS — N1832 Chronic kidney disease, stage 3b: Secondary | ICD-10-CM | POA: Diagnosis not present

## 2021-07-15 DIAGNOSIS — D509 Iron deficiency anemia, unspecified: Secondary | ICD-10-CM | POA: Diagnosis not present

## 2021-07-15 DIAGNOSIS — F419 Anxiety disorder, unspecified: Secondary | ICD-10-CM | POA: Diagnosis not present

## 2021-07-15 DIAGNOSIS — I5032 Chronic diastolic (congestive) heart failure: Secondary | ICD-10-CM | POA: Diagnosis not present

## 2021-07-15 DIAGNOSIS — Z6839 Body mass index (BMI) 39.0-39.9, adult: Secondary | ICD-10-CM | POA: Diagnosis not present

## 2021-07-15 DIAGNOSIS — M1990 Unspecified osteoarthritis, unspecified site: Secondary | ICD-10-CM | POA: Diagnosis not present

## 2021-07-15 DIAGNOSIS — E1165 Type 2 diabetes mellitus with hyperglycemia: Secondary | ICD-10-CM | POA: Diagnosis not present

## 2021-07-15 DIAGNOSIS — D699 Hemorrhagic condition, unspecified: Secondary | ICD-10-CM | POA: Diagnosis not present

## 2021-07-15 DIAGNOSIS — N179 Acute kidney failure, unspecified: Secondary | ICD-10-CM | POA: Diagnosis not present

## 2021-07-15 DIAGNOSIS — M5441 Lumbago with sciatica, right side: Secondary | ICD-10-CM | POA: Diagnosis not present

## 2021-07-15 DIAGNOSIS — L080 Pyoderma: Secondary | ICD-10-CM | POA: Diagnosis not present

## 2021-07-15 DIAGNOSIS — E1122 Type 2 diabetes mellitus with diabetic chronic kidney disease: Secondary | ICD-10-CM | POA: Diagnosis not present

## 2021-07-15 DIAGNOSIS — E782 Mixed hyperlipidemia: Secondary | ICD-10-CM | POA: Diagnosis not present

## 2021-07-15 DIAGNOSIS — R32 Unspecified urinary incontinence: Secondary | ICD-10-CM | POA: Diagnosis not present

## 2021-07-20 DIAGNOSIS — G4733 Obstructive sleep apnea (adult) (pediatric): Secondary | ICD-10-CM | POA: Diagnosis not present

## 2021-07-22 DIAGNOSIS — E1165 Type 2 diabetes mellitus with hyperglycemia: Secondary | ICD-10-CM | POA: Diagnosis not present

## 2021-07-22 DIAGNOSIS — E538 Deficiency of other specified B group vitamins: Secondary | ICD-10-CM | POA: Diagnosis not present

## 2021-07-22 DIAGNOSIS — I130 Hypertensive heart and chronic kidney disease with heart failure and stage 1 through stage 4 chronic kidney disease, or unspecified chronic kidney disease: Secondary | ICD-10-CM | POA: Diagnosis not present

## 2021-07-22 DIAGNOSIS — M109 Gout, unspecified: Secondary | ICD-10-CM | POA: Diagnosis not present

## 2021-07-22 DIAGNOSIS — E559 Vitamin D deficiency, unspecified: Secondary | ICD-10-CM | POA: Diagnosis not present

## 2021-07-22 DIAGNOSIS — N1832 Chronic kidney disease, stage 3b: Secondary | ICD-10-CM | POA: Diagnosis not present

## 2021-07-22 DIAGNOSIS — G4733 Obstructive sleep apnea (adult) (pediatric): Secondary | ICD-10-CM | POA: Diagnosis not present

## 2021-07-22 DIAGNOSIS — E782 Mixed hyperlipidemia: Secondary | ICD-10-CM | POA: Diagnosis not present

## 2021-07-22 DIAGNOSIS — I5032 Chronic diastolic (congestive) heart failure: Secondary | ICD-10-CM | POA: Diagnosis not present

## 2021-07-22 DIAGNOSIS — D631 Anemia in chronic kidney disease: Secondary | ICD-10-CM | POA: Diagnosis not present

## 2021-07-22 DIAGNOSIS — E1122 Type 2 diabetes mellitus with diabetic chronic kidney disease: Secondary | ICD-10-CM | POA: Diagnosis not present

## 2021-07-22 DIAGNOSIS — R32 Unspecified urinary incontinence: Secondary | ICD-10-CM | POA: Diagnosis not present

## 2021-07-22 DIAGNOSIS — M1990 Unspecified osteoarthritis, unspecified site: Secondary | ICD-10-CM | POA: Diagnosis not present

## 2021-07-22 DIAGNOSIS — D509 Iron deficiency anemia, unspecified: Secondary | ICD-10-CM | POA: Diagnosis not present

## 2021-07-22 DIAGNOSIS — F5101 Primary insomnia: Secondary | ICD-10-CM | POA: Diagnosis not present

## 2021-07-22 DIAGNOSIS — L080 Pyoderma: Secondary | ICD-10-CM | POA: Diagnosis not present

## 2021-07-22 DIAGNOSIS — F331 Major depressive disorder, recurrent, moderate: Secondary | ICD-10-CM | POA: Diagnosis not present

## 2021-07-22 DIAGNOSIS — M5441 Lumbago with sciatica, right side: Secondary | ICD-10-CM | POA: Diagnosis not present

## 2021-07-22 DIAGNOSIS — D699 Hemorrhagic condition, unspecified: Secondary | ICD-10-CM | POA: Diagnosis not present

## 2021-07-22 DIAGNOSIS — F419 Anxiety disorder, unspecified: Secondary | ICD-10-CM | POA: Diagnosis not present

## 2021-07-22 DIAGNOSIS — Z6839 Body mass index (BMI) 39.0-39.9, adult: Secondary | ICD-10-CM | POA: Diagnosis not present

## 2021-07-22 DIAGNOSIS — J45909 Unspecified asthma, uncomplicated: Secondary | ICD-10-CM | POA: Diagnosis not present

## 2021-07-22 DIAGNOSIS — N179 Acute kidney failure, unspecified: Secondary | ICD-10-CM | POA: Diagnosis not present

## 2021-07-28 ENCOUNTER — Encounter: Payer: Self-pay | Admitting: Internal Medicine

## 2021-07-28 ENCOUNTER — Ambulatory Visit: Payer: HMO | Admitting: Internal Medicine

## 2021-07-28 VITALS — BP 138/60 | HR 63 | Temp 98.3°F | Resp 16 | Ht 63.0 in | Wt 226.0 lb

## 2021-07-28 DIAGNOSIS — I5032 Chronic diastolic (congestive) heart failure: Secondary | ICD-10-CM

## 2021-07-28 DIAGNOSIS — G4733 Obstructive sleep apnea (adult) (pediatric): Secondary | ICD-10-CM

## 2021-07-28 DIAGNOSIS — N1832 Chronic kidney disease, stage 3b: Secondary | ICD-10-CM

## 2021-07-28 NOTE — Patient Instructions (Signed)

## 2021-07-28 NOTE — Progress Notes (Signed)
[facility] [place], [place] [address]  Pulmonary Sleep Medicine   Office Visit Note  Patient Name: Frances Greene DOB: November 01, 1945 MRN [id number]  Date of Service: 07/28/2021  Complaints/HPI: OSA she is on CPAP therapy now. She staets she is feeling good and has been using the device as prescribed. Denies having any chest pain or palpitations. She states mask fits fine. She has no issues with sinus congestion etc  ROS  General: (-) fever, (-) chills, (-) night sweats, (-) weakness Skin: (-) rashes, (-) itching,. Eyes: (-) visual changes, (-) redness, (-) itching. Nose and Sinuses: (-) nasal stuffiness or itchiness, (-) postnasal drip, (-) nosebleeds, (-) sinus trouble. Mouth and Throat: (-) sore throat, (-) hoarseness. Neck: (-) swollen glands, (-) enlarged thyroid, (-) neck pain. Respiratory: - cough, (-) bloody sputum, - shortness of breath, - wheezing. Cardiovascular: - ankle swelling, (-) chest pain. Lymphatic: (-) lymph node enlargement. Neurologic: (-) numbness, (-) tingling. Psychiatric: (-) anxiety, (-) depression   Current Medication: Outpatient Encounter Medications as of 07/28/2021  Medication Sig   acetaminophen (TYLENOL) 325 MG tablet Take 650 mg by mouth every 6 (six) hours as needed.   AMBULATORY NON FORMULARY MEDICATION Medication Name: geni kot natural veggie laxitive   atorvastatin (LIPITOR) 10 MG tablet TAKE ONE TABLET BY MOUTH ONCE DAILY   betamethasone dipropionate 0.05 % cream Apply topically 2 (two) times daily.   bisoprolol (ZEBETA) 5 MG tablet Take 1 tablet (5 mg total) by mouth daily.   Blood Glucose Monitoring Suppl (ONETOUCH VERIO REFLECT) w/Device KIT Use as directed for once daily Dx E11.65   Cholecalciferol (VITAMIN D3) 1.25 MG (50000 UT) CAPS Take by mouth daily.   cinacalcet (SENSIPAR) 30 MG tablet    citalopram (CELEXA) 10 MG tablet Take 1 tablet (10 mg total) by mouth daily.   empagliflozin (JARDIANCE) 10 MG  TABS tablet Take 1 tablet (10 mg total) by mouth daily.   febuxostat (ULORIC) 40 MG tablet Take 1 tablet (40 mg total) by mouth daily.   fluticasone-salmeterol (ADVAIR) 100-50 MCG/ACT AEPB Inhale 1 puff into the lungs 2 (two) times daily.   folic acid (FOLVITE) 1 MG tablet TAKE ONE TABLET BY MOUTH ONCE DAILY   furosemide (LASIX) 80 MG tablet Take 1 tablet (80 mg total) by mouth every morning.   Garlic 7124 MG CAPS Take by mouth.   glucose blood (ONETOUCH VERIO) test strip Use as directed once a daily   hydrALAZINE (APRESOLINE) 25 MG tablet TAKE ONE TABLET BY MOUTH THREE TIMES DAILY   HYDROcodone-acetaminophen (NORCO/VICODIN) 5-325 MG tablet Take 1 tablet by mouth every 6 (six) hours as needed for moderate pain or severe pain.   Insulin Pen Needle (PEN NEEDLES) 32G X 4 MM MISC 1 Units by Does not apply route daily. Use with victoza injections daily.   ketoconazole (NIZORAL) 2 % shampoo SHAMPOO AS DIRECTED   latanoprost (XALATAN) 0.005 % ophthalmic solution latanoprost 0.005 % eye drops  INSTILL 1 DROP INTO EACH EYE IN THE EVENING   liraglutide (VICTOZA) 18 MG/3ML SOPN INJECT 1.8 MG ONCE DAILY SUBCUTANEOUSLY   meloxicam (MOBIC) 7.5 MG tablet TAKE 1 TABLET BY MOUTH ONCE DAILY FOR  GOUT  AND  ARTHRITIS   mirabegron ER (MYRBETRIQ) 50 MG TB24 tablet Take 1 tablet (50 mg total) by mouth daily.   montelukast (SINGULAIR) 10 MG tablet Take 1 tablet (10 mg total) by mouth daily.   NON FORMULARY CPAP pt uses [facility] for CPAP  Omega-3 Fatty Acids (FISH OIL PO) Take by mouth.   OneTouch Delica Lancets 35T MISC Use as directed once a daily DxE11.65   pregabalin (LYRICA) 25 MG capsule Take 1 capsule (25 mg total) by mouth 2 (two) times daily.   senna (SENOKOT) 8.6 MG TABS tablet Take 1 tablet by mouth.   Sulfacetamide Sodium (SODIUM SULFACETAMIDE) 10 % SHAM Apply topically.   tizanidine (ZANAFLEX) 2 MG capsule Take one tab po qhs for spasm   traZODone (DESYREL) 50 MG tablet Take 0.5-1 tablets  (25-50 mg total) by mouth at bedtime as needed for sleep.   Vitamin D, Ergocalciferol, (DRISDOL) 1.25 MG (50000 UNIT) CAPS capsule TAKE ONE CAPSULE BY MOUTH ONCE WEEKLY ON MONDAY   No facility-administered encounter medications on file as of 07/28/2021.    Surgical History: Past Surgical History:  Procedure Laterality Date   ABDOMINAL HYSTERECTOMY     BREAST BIOPSY Left 1989   neg   CATARACT EXTRACTION W/PHACO Right 05/21/2015   Procedure: CATARACT EXTRACTION PHACO AND INTRAOCULAR LENS PLACEMENT (IOC);  Surgeon: Milus Height, MD;  Location: [facility];  Service: Ophthalmology;  Laterality: Right;  [place]: AP%: 10.0 CDE: 9.53   CATARACT EXTRACTION W/PHACO Left 12/31/2020   Procedure: CATARACT EXTRACTION PHACO AND INTRAOCULAR LENS PLACEMENT (IOC) LEFT DIABETIC 4.13 01:00.9;  Surgeon: Leandrew Koyanagi, MD;  Location: [facility];  Service: Ophthalmology;  Laterality: Left;   COLONOSCOPY     JOINT REPLACEMENT Bilateral    knee   PARS PLANA VITRECTOMY Right 05/21/2015   Procedure: PARS PLANA VITRECTOMY WITH 25 GAUGE;  Surgeon: Milus Height, MD;  Location: [facility];  Service: Ophthalmology;  Laterality: Right;  Lot # [id number] Endo laser: Watts: 300 Pulse Duration: 200 Total Pulses:143    shoulder surgery      Medical History: Past Medical History:  Diagnosis Date   Arthritis    Asthma    Bronchitis    CHF (congestive heart failure) (HCC)    Chronic kidney disease    decreased kidney function   Diabetes mellitus without complication (HCC)    Gout    Heart murmur    Hypertension    Multiple allergies    Shortness of breath dyspnea    w/ exertion   Sleep apnea    CPAP   Swelling of both lower extremities    Wheezing     Family History: Family History  Problem Relation Age of Onset   Breast cancer Cousin    Breast cancer Other    Hypertension Mother    Diabetes Mother    Hypertension Father    Diabetes Father    Alcohol abuse Father    Aneurysm Sister     Alcohol abuse Sister    Stroke Sister        died from stroke   Hepatitis Brother    Hypertension Sister        dialysis   Diabetes Sister    Alcoholism Brother    Alcoholism Brother        dies from head injury from a fall.  not an alcoholic,   Cancer Brother    Diabetes Brother    Hypertension Brother    Diabetes Brother    Cancer Brother    Hypertension Brother    Stroke Brother    Alcoholism Brother     Social History: Social History   Socioeconomic History   Marital status: Divorced    Spouse name: Not on file   Number of children: Not on file  Years of education: Not on file   Highest education level: Not on file  Occupational History   Not on file  Tobacco Use   Smoking status: Former    Types: Cigarettes   Smokeless tobacco: Never   Tobacco comments:    4 cigarettes quit 20 years ago  Vaping Use   Vaping Use: Never used  Substance and Sexual Activity   Alcohol use: No   Drug use: Never   Sexual activity: Not on file  Other Topics Concern   Not on file  Social History Narrative   Not on file   Social Determinants of Health   Financial Resource Strain: Low Risk  (12/04/2020)   Overall Financial Resource Strain (CARDIA)    Difficulty of Paying Living Expenses: Not very hard  Food Insecurity: Not on file  Transportation Needs: Not on file  Physical Activity: Not on file  Stress: Not on file  Social Connections: Not on file  Intimate Partner Violence: Not on file    Vital Signs: Blood pressure 138/60, pulse 63, temperature 98.3 F (36.8 C), resp. rate 16, height $RemoveBe'5\' 3"'AyCehwOLd$  (1.6 m), weight 226 lb (102.5 kg), SpO2 96 %.  Examination: General Appearance: The patient is well-developed, well-nourished, and in no distress. Skin: Gross inspection of skin unremarkable. Head: normocephalic, no gross deformities. Eyes: no gross deformities noted. ENT: ears appear grossly normal no exudates. Neck: Supple. No thyromegaly. No LAD. Respiratory: no rhonchi  noted. Cardiovascular: Normal S1 and S2 without murmur or rub. Extremities: No cyanosis. pulses are equal. Neurologic: Alert and oriented. No involuntary movements.  LABS: Recent Results (from the past 2160 hour(s))  CMP14+EGFR     Status: Abnormal   Collection Time: 06/16/21 11:39 AM  Result Value Ref Range   Glucose 103 (H) 70 - 99 mg/dL   BUN 29 (H) 8 - 27 mg/dL   Creatinine, Ser 1.90 (H) 0.57 - 1.00 mg/dL   eGFR 27 (L) >59 mL/min/1.73   BUN/Creatinine Ratio 15 12 - 28   Sodium 142 134 - 144 mmol/L   Potassium 4.0 3.5 - 5.2 mmol/L   Chloride 102 96 - 106 mmol/L   CO2 23 20 - 29 mmol/L   Calcium 10.6 (H) 8.7 - 10.3 mg/dL   Total Protein 6.0 6.0 - 8.5 g/dL   Albumin 4.0 3.7 - 4.7 g/dL   Globulin, Total 2.0 1.5 - 4.5 g/dL   Albumin/Globulin Ratio 2.0 1.2 - 2.2   Bilirubin Total 0.6 0.0 - 1.2 mg/dL   Alkaline Phosphatase 65 44 - 121 IU/L   AST 9 0 - 40 IU/L   ALT 9 0 - 32 IU/L  Platelet count     Status: Abnormal   Collection Time: 06/16/21 11:39 AM  Result Value Ref Range   Platelets 148 (L) 150 - 450 x10E3/uL  HSV(herpes simplex vrs) 1+2 ab-IgG     Status: Abnormal   Collection Time: 06/16/21 11:39 AM  Result Value Ref Range   HSV 1 Glycoprotein G Ab, IgG 39.60 (H) 0.00 - 0.90 index    Comment:                                  Negative        <0.91                                  Equivocal  0.91 - 1.09                                  Positive        >1.09  Note: Negative indicates no antibodies detected to  HSV-1. Equivocal may suggest early infection.  If  clinically appropriate, retest at later date. 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    HSV 2 IgG, Type Spec >23.60 (H) 0.00 - 0.90 index    Comment:                                  Negative        <0.91                                  Equivocal 0.91 - 1.09                                  Positive        >1.09  Note: Negative indicates no HSV-2 antibodies detected.  Positive indicates HSV-2  antibodies detected.  Equivocal and low positive HSV-2 screens  (Index 0.91-5.00) may be false positive and are  reflexed to supplemental testing in accordance with  CDC guidelines.   POCT glycosylated hemoglobin (Hb A1C)     Status: Abnormal   Collection Time: 07/02/21  3:55 PM  Result Value Ref Range   Hemoglobin A1C 5.7 (A) 4.0 - 5.6 %   HbA1c POC (<> result, manual entry)     HbA1c, POC (prediabetic range)     HbA1c, POC (controlled diabetic range)      Radiology: MM DIAG BREAST TOMO BILATERAL  Result Date: 03/10/2021 CLINICAL DATA:  Patient recalled from screening for right breast calcifications and left breast retroareolar swelling. Patient predominately sleeps on her left side and has issues with edema. She states this has worsened over the last 3 months. EXAM: DIGITAL DIAGNOSTIC BILATERAL MAMMOGRAM WITH TOMOSYNTHESIS AND CAD; ULTRASOUND LEFT BREAST LIMITED TECHNIQUE: Bilateral digital diagnostic mammography and breast tomosynthesis was performed. The images were evaluated with computer-aided detection.; Targeted ultrasound examination of the left breast was performed. COMPARISON:  Previous exam(s). ACR Breast Density Category c: The breast tissue is heterogeneously dense, which may obscure small masses. FINDINGS: Magnification views of the right breast demonstrate the calcifications to represent vascular calcifications. Additional imaging of the left breast demonstrates persistent periareolar skin thickening as well as retroareolar trabecular and parenchymal thickening. On physical exam, there is thickening of the periareolar skin of the left breast without skin discoloration identified. Targeted ultrasound is performed, showing skin thickening within the periareolar left breast. There is edematous change identified within the retroareolar tissues of the left breast. IMPRESSION: 1. Periareolar skin thickening and retroareolar trabecular and parenchymal thickening are nonspecific however  favored to be secondary to edematous change. Possibility of malignant process is much less likely. 2. Benign right breast vascular calcifications. RECOMMENDATION: Surgical consultation for possible skin punch biopsy of the periareolar left breast for definitive diagnosis. Additionally, patient was instructed to return in 3 months for re-evaluation of the left breast. I have discussed the findings and recommendations with the patient. If applicable, a reminder letter will be sent to the patient regarding the next appointment. BI-RADS CATEGORY  3: Probably benign. Electronically Signed   By: Lovey Newcomer M.D.   On: 03/10/2021 15:44  US BREAST LTD UNI LEFT INC AXILLA  Result Date: 03/10/2021 CLINICAL DATA:  Patient recalled from screening for right breast calcifications and left breast retroareolar swelling. Patient predominately sleeps on her left side and has issues with edema. She states this has worsened over the last 3 months. EXAM: DIGITAL DIAGNOSTIC BILATERAL MAMMOGRAM WITH TOMOSYNTHESIS AND CAD; ULTRASOUND LEFT BREAST LIMITED TECHNIQUE: Bilateral digital diagnostic mammography and breast tomosynthesis was performed. The images were evaluated with computer-aided detection.; Targeted ultrasound examination of the left breast was performed. COMPARISON:  Previous exam(s). ACR Breast Density Category c: The breast tissue is heterogeneously dense, which may obscure small masses. FINDINGS: Magnification views of the right breast demonstrate the calcifications to represent vascular calcifications. Additional imaging of the left breast demonstrates persistent periareolar skin thickening as well as retroareolar trabecular and parenchymal thickening. On physical exam, there is thickening of the periareolar skin of the left breast without skin discoloration identified. Targeted ultrasound is performed, showing skin thickening within the periareolar left breast. There is edematous change identified within the  retroareolar tissues of the left breast. IMPRESSION: 1. Periareolar skin thickening and retroareolar trabecular and parenchymal thickening are nonspecific however favored to be secondary to edematous change. Possibility of malignant process is much less likely. 2. Benign right breast vascular calcifications. RECOMMENDATION: Surgical consultation for possible skin punch biopsy of the periareolar left breast for definitive diagnosis. Additionally, patient was instructed to return in 3 months for re-evaluation of the left breast. I have discussed the findings and recommendations with the patient. If applicable, a reminder letter will be sent to the patient regarding the next appointment. BI-RADS CATEGORY  3: Probably benign. Electronically Signed   By: Lovey Newcomer M.D.   On: 03/10/2021 15:44   No results found.  No results found.    Assessment and Plan: Patient Active Problem List   Diagnosis Date Noted   Chronic heart failure with preserved ejection fraction ([place]) 06/11/2021   Thrombocytopenia ([place]) 06/11/2021   B12 deficiency 03/19/2020   Anemia in stage 3b chronic kidney disease ([place]) 03/19/2020   Acute bilateral low back pain with right-sided sciatica 11/12/2018   Closed fracture of right proximal humerus 11/12/2018   Type 2 diabetes mellitus with hyperglycemia ([place]) 10/28/2018   Flu vaccine need 10/28/2018   Pain due to onychomycosis of toenails of both feet 07/17/2018   Type 2 diabetes mellitus with vascular disease ([place]) 07/17/2018   Other fatigue 11/14/2017   Moderate episode of recurrent major depressive disorder ([place]) 11/14/2017   Chronic allergic rhinitis 11/14/2017   Primary insomnia 11/14/2017   Uncontrolled type 2 diabetes mellitus with hyperglycemia ([place]) 09/18/2017   Acute pain of right shoulder 09/18/2017   Type 2 diabetes mellitus with chronic kidney disease ([place]) 01/19/2017   Anosmia 01/19/2017   Cervicalgia 01/19/2017   Vitamin D deficiency 01/19/2017   Hypoglycemia  01/19/2017   OSA (obstructive sleep apnea) 01/19/2017   Essential hypertension, benign 01/19/2017   Mixed hyperlipidemia 01/19/2017   Anemia, unspecified 01/19/2017   Edema 01/19/2017   SOB (shortness of breath) 01/19/2017   Gout 01/19/2017   Morbid obesity ([place]) 01/19/2017   Stage 3 chronic kidney disease ([place]) 01/19/2017   Hypercalcemia 01/19/2017   Iron deficiency anemia 01/14/2016   Acute pain of right knee 04/23/2015   Left wrist pain 08/12/2014   Status post bilateral unicompartmental knee replacement 11/13/2013   Arthritis, senescent 08/02/2013  1. OSA (obstructive sleep apnea) On CPAP therapy excellent compliance she does feel better continue to use her CPAP as prescribed  2. Obesity, morbid ([place]) Obesity Counseling: Had a lengthy discussion regarding patients BMI and weight issues. Patient was instructed on portion control as well as increased activity. Also discussed caloric restrictions with trying to maintain intake less than 2000 Kcal. Discussions were made in accordance with the 5As of weight management. Simple actions such as not eating late and if able to, taking a walk is suggested.   3. Chronic heart failure with preserved ejection fraction ([place]) Appears to be compensated continue with following with her cardiologist primary  4. Stage 3b chronic kidney disease ([place]) Supportive care monitor fluid status and diuretic use  General Counseling: I have discussed the findings of the evaluation and examination with Pamala Hurry.  I have also discussed any further diagnostic evaluation thatmay be needed or ordered today. Angeliyah verbalizes understanding of the findings of todays visit. We also reviewed her medications today and discussed drug interactions and side effects including but not limited excessive drowsiness and altered mental states. We also discussed that there is always a risk not just to her but also people around her. she has been encouraged to call the office with  any questions or concerns that should arise related to todays visit.  No orders of the defined types were placed in this encounter.    Time spent: 45  I have personally obtained a history, examined the patient, evaluated laboratory and imaging results, formulated the assessment and plan and placed orders.    Allyne Gee, MD [facility] Pulmonary and Critical Care Sleep medicine

## 2021-07-31 ENCOUNTER — Other Ambulatory Visit: Payer: Self-pay

## 2021-07-31 MED ORDER — CITALOPRAM HYDROBROMIDE 10 MG PO TABS: 10.0000 mg | ORAL_TABLET | Freq: Every day | ORAL | 1 refills | Status: DC

## 2021-08-07 DIAGNOSIS — D631 Anemia in chronic kidney disease: Secondary | ICD-10-CM | POA: Diagnosis not present

## 2021-08-07 DIAGNOSIS — E782 Mixed hyperlipidemia: Secondary | ICD-10-CM | POA: Diagnosis not present

## 2021-08-07 DIAGNOSIS — D699 Hemorrhagic condition, unspecified: Secondary | ICD-10-CM | POA: Diagnosis not present

## 2021-08-07 DIAGNOSIS — N179 Acute kidney failure, unspecified: Secondary | ICD-10-CM | POA: Diagnosis not present

## 2021-08-07 DIAGNOSIS — M1990 Unspecified osteoarthritis, unspecified site: Secondary | ICD-10-CM | POA: Diagnosis not present

## 2021-08-07 DIAGNOSIS — F5101 Primary insomnia: Secondary | ICD-10-CM | POA: Diagnosis not present

## 2021-08-07 DIAGNOSIS — F331 Major depressive disorder, recurrent, moderate: Secondary | ICD-10-CM | POA: Diagnosis not present

## 2021-08-07 DIAGNOSIS — F330 Major depressive disorder, recurrent, mild: Secondary | ICD-10-CM | POA: Diagnosis not present

## 2021-08-07 DIAGNOSIS — D573 Sickle-cell trait: Secondary | ICD-10-CM | POA: Diagnosis not present

## 2021-08-07 DIAGNOSIS — E1122 Type 2 diabetes mellitus with diabetic chronic kidney disease: Secondary | ICD-10-CM | POA: Diagnosis not present

## 2021-08-07 DIAGNOSIS — I272 Pulmonary hypertension, unspecified: Secondary | ICD-10-CM | POA: Diagnosis not present

## 2021-08-07 DIAGNOSIS — R32 Unspecified urinary incontinence: Secondary | ICD-10-CM | POA: Diagnosis not present

## 2021-08-07 DIAGNOSIS — E1151 Type 2 diabetes mellitus with diabetic peripheral angiopathy without gangrene: Secondary | ICD-10-CM | POA: Diagnosis not present

## 2021-08-07 DIAGNOSIS — I130 Hypertensive heart and chronic kidney disease with heart failure and stage 1 through stage 4 chronic kidney disease, or unspecified chronic kidney disease: Secondary | ICD-10-CM | POA: Diagnosis not present

## 2021-08-07 DIAGNOSIS — G4733 Obstructive sleep apnea (adult) (pediatric): Secondary | ICD-10-CM | POA: Diagnosis not present

## 2021-08-07 DIAGNOSIS — N1832 Chronic kidney disease, stage 3b: Secondary | ICD-10-CM | POA: Diagnosis not present

## 2021-08-07 DIAGNOSIS — N186 End stage renal disease: Secondary | ICD-10-CM | POA: Diagnosis not present

## 2021-08-07 DIAGNOSIS — E213 Hyperparathyroidism, unspecified: Secondary | ICD-10-CM | POA: Diagnosis not present

## 2021-08-07 DIAGNOSIS — I5032 Chronic diastolic (congestive) heart failure: Secondary | ICD-10-CM | POA: Diagnosis not present

## 2021-08-07 DIAGNOSIS — E559 Vitamin D deficiency, unspecified: Secondary | ICD-10-CM | POA: Diagnosis not present

## 2021-08-07 DIAGNOSIS — D509 Iron deficiency anemia, unspecified: Secondary | ICD-10-CM | POA: Diagnosis not present

## 2021-08-07 DIAGNOSIS — J45909 Unspecified asthma, uncomplicated: Secondary | ICD-10-CM | POA: Diagnosis not present

## 2021-08-07 DIAGNOSIS — Z6839 Body mass index (BMI) 39.0-39.9, adult: Secondary | ICD-10-CM | POA: Diagnosis not present

## 2021-08-07 DIAGNOSIS — E1165 Type 2 diabetes mellitus with hyperglycemia: Secondary | ICD-10-CM | POA: Diagnosis not present

## 2021-08-07 DIAGNOSIS — E1140 Type 2 diabetes mellitus with diabetic neuropathy, unspecified: Secondary | ICD-10-CM | POA: Diagnosis not present

## 2021-08-07 DIAGNOSIS — F419 Anxiety disorder, unspecified: Secondary | ICD-10-CM | POA: Diagnosis not present

## 2021-08-07 DIAGNOSIS — M5441 Lumbago with sciatica, right side: Secondary | ICD-10-CM | POA: Diagnosis not present

## 2021-08-07 DIAGNOSIS — M109 Gout, unspecified: Secondary | ICD-10-CM | POA: Diagnosis not present

## 2021-08-07 DIAGNOSIS — I509 Heart failure, unspecified: Secondary | ICD-10-CM | POA: Diagnosis not present

## 2021-08-07 DIAGNOSIS — L080 Pyoderma: Secondary | ICD-10-CM | POA: Diagnosis not present

## 2021-08-07 DIAGNOSIS — E538 Deficiency of other specified B group vitamins: Secondary | ICD-10-CM | POA: Diagnosis not present

## 2021-08-10 DIAGNOSIS — L080 Pyoderma: Secondary | ICD-10-CM | POA: Diagnosis not present

## 2021-08-10 DIAGNOSIS — J45909 Unspecified asthma, uncomplicated: Secondary | ICD-10-CM | POA: Diagnosis not present

## 2021-08-10 DIAGNOSIS — E559 Vitamin D deficiency, unspecified: Secondary | ICD-10-CM | POA: Diagnosis not present

## 2021-08-10 DIAGNOSIS — F5101 Primary insomnia: Secondary | ICD-10-CM | POA: Diagnosis not present

## 2021-08-10 DIAGNOSIS — D631 Anemia in chronic kidney disease: Secondary | ICD-10-CM | POA: Diagnosis not present

## 2021-08-10 DIAGNOSIS — M5441 Lumbago with sciatica, right side: Secondary | ICD-10-CM | POA: Diagnosis not present

## 2021-08-10 DIAGNOSIS — F331 Major depressive disorder, recurrent, moderate: Secondary | ICD-10-CM | POA: Diagnosis not present

## 2021-08-10 DIAGNOSIS — D699 Hemorrhagic condition, unspecified: Secondary | ICD-10-CM | POA: Diagnosis not present

## 2021-08-10 DIAGNOSIS — F419 Anxiety disorder, unspecified: Secondary | ICD-10-CM | POA: Diagnosis not present

## 2021-08-10 DIAGNOSIS — E1122 Type 2 diabetes mellitus with diabetic chronic kidney disease: Secondary | ICD-10-CM | POA: Diagnosis not present

## 2021-08-10 DIAGNOSIS — E782 Mixed hyperlipidemia: Secondary | ICD-10-CM | POA: Diagnosis not present

## 2021-08-10 DIAGNOSIS — N1832 Chronic kidney disease, stage 3b: Secondary | ICD-10-CM | POA: Diagnosis not present

## 2021-08-10 DIAGNOSIS — E1165 Type 2 diabetes mellitus with hyperglycemia: Secondary | ICD-10-CM | POA: Diagnosis not present

## 2021-08-10 DIAGNOSIS — R32 Unspecified urinary incontinence: Secondary | ICD-10-CM | POA: Diagnosis not present

## 2021-08-10 DIAGNOSIS — M1990 Unspecified osteoarthritis, unspecified site: Secondary | ICD-10-CM | POA: Diagnosis not present

## 2021-08-10 DIAGNOSIS — D509 Iron deficiency anemia, unspecified: Secondary | ICD-10-CM | POA: Diagnosis not present

## 2021-08-10 DIAGNOSIS — G4733 Obstructive sleep apnea (adult) (pediatric): Secondary | ICD-10-CM | POA: Diagnosis not present

## 2021-08-10 DIAGNOSIS — I5032 Chronic diastolic (congestive) heart failure: Secondary | ICD-10-CM | POA: Diagnosis not present

## 2021-08-10 DIAGNOSIS — M109 Gout, unspecified: Secondary | ICD-10-CM | POA: Diagnosis not present

## 2021-08-10 DIAGNOSIS — N179 Acute kidney failure, unspecified: Secondary | ICD-10-CM | POA: Diagnosis not present

## 2021-08-10 DIAGNOSIS — Z6839 Body mass index (BMI) 39.0-39.9, adult: Secondary | ICD-10-CM | POA: Diagnosis not present

## 2021-08-10 DIAGNOSIS — E538 Deficiency of other specified B group vitamins: Secondary | ICD-10-CM | POA: Diagnosis not present

## 2021-08-10 DIAGNOSIS — I130 Hypertensive heart and chronic kidney disease with heart failure and stage 1 through stage 4 chronic kidney disease, or unspecified chronic kidney disease: Secondary | ICD-10-CM | POA: Diagnosis not present

## 2021-08-12 DIAGNOSIS — M5441 Lumbago with sciatica, right side: Secondary | ICD-10-CM | POA: Diagnosis not present

## 2021-08-12 DIAGNOSIS — M1990 Unspecified osteoarthritis, unspecified site: Secondary | ICD-10-CM | POA: Diagnosis not present

## 2021-08-12 DIAGNOSIS — F331 Major depressive disorder, recurrent, moderate: Secondary | ICD-10-CM | POA: Diagnosis not present

## 2021-08-12 DIAGNOSIS — I5032 Chronic diastolic (congestive) heart failure: Secondary | ICD-10-CM | POA: Diagnosis not present

## 2021-08-12 DIAGNOSIS — E782 Mixed hyperlipidemia: Secondary | ICD-10-CM | POA: Diagnosis not present

## 2021-08-12 DIAGNOSIS — N1832 Chronic kidney disease, stage 3b: Secondary | ICD-10-CM | POA: Diagnosis not present

## 2021-08-12 DIAGNOSIS — N179 Acute kidney failure, unspecified: Secondary | ICD-10-CM | POA: Diagnosis not present

## 2021-08-12 DIAGNOSIS — I130 Hypertensive heart and chronic kidney disease with heart failure and stage 1 through stage 4 chronic kidney disease, or unspecified chronic kidney disease: Secondary | ICD-10-CM | POA: Diagnosis not present

## 2021-08-12 DIAGNOSIS — E559 Vitamin D deficiency, unspecified: Secondary | ICD-10-CM | POA: Diagnosis not present

## 2021-08-12 DIAGNOSIS — F419 Anxiety disorder, unspecified: Secondary | ICD-10-CM | POA: Diagnosis not present

## 2021-08-12 DIAGNOSIS — E538 Deficiency of other specified B group vitamins: Secondary | ICD-10-CM | POA: Diagnosis not present

## 2021-08-12 DIAGNOSIS — D631 Anemia in chronic kidney disease: Secondary | ICD-10-CM | POA: Diagnosis not present

## 2021-08-12 DIAGNOSIS — L080 Pyoderma: Secondary | ICD-10-CM | POA: Diagnosis not present

## 2021-08-12 DIAGNOSIS — F5101 Primary insomnia: Secondary | ICD-10-CM | POA: Diagnosis not present

## 2021-08-12 DIAGNOSIS — D509 Iron deficiency anemia, unspecified: Secondary | ICD-10-CM | POA: Diagnosis not present

## 2021-08-12 DIAGNOSIS — G4733 Obstructive sleep apnea (adult) (pediatric): Secondary | ICD-10-CM | POA: Diagnosis not present

## 2021-08-12 DIAGNOSIS — D699 Hemorrhagic condition, unspecified: Secondary | ICD-10-CM | POA: Diagnosis not present

## 2021-08-12 DIAGNOSIS — E1165 Type 2 diabetes mellitus with hyperglycemia: Secondary | ICD-10-CM | POA: Diagnosis not present

## 2021-08-12 DIAGNOSIS — J45909 Unspecified asthma, uncomplicated: Secondary | ICD-10-CM | POA: Diagnosis not present

## 2021-08-12 DIAGNOSIS — Z6839 Body mass index (BMI) 39.0-39.9, adult: Secondary | ICD-10-CM | POA: Diagnosis not present

## 2021-08-12 DIAGNOSIS — R32 Unspecified urinary incontinence: Secondary | ICD-10-CM | POA: Diagnosis not present

## 2021-08-12 DIAGNOSIS — E1122 Type 2 diabetes mellitus with diabetic chronic kidney disease: Secondary | ICD-10-CM | POA: Diagnosis not present

## 2021-08-12 DIAGNOSIS — M109 Gout, unspecified: Secondary | ICD-10-CM | POA: Diagnosis not present

## 2021-08-19 DIAGNOSIS — I5032 Chronic diastolic (congestive) heart failure: Secondary | ICD-10-CM | POA: Diagnosis not present

## 2021-08-19 DIAGNOSIS — E1122 Type 2 diabetes mellitus with diabetic chronic kidney disease: Secondary | ICD-10-CM | POA: Diagnosis not present

## 2021-08-19 DIAGNOSIS — M5441 Lumbago with sciatica, right side: Secondary | ICD-10-CM | POA: Diagnosis not present

## 2021-08-19 DIAGNOSIS — I130 Hypertensive heart and chronic kidney disease with heart failure and stage 1 through stage 4 chronic kidney disease, or unspecified chronic kidney disease: Secondary | ICD-10-CM | POA: Diagnosis not present

## 2021-08-19 DIAGNOSIS — D699 Hemorrhagic condition, unspecified: Secondary | ICD-10-CM | POA: Diagnosis not present

## 2021-08-19 DIAGNOSIS — N179 Acute kidney failure, unspecified: Secondary | ICD-10-CM | POA: Diagnosis not present

## 2021-08-19 DIAGNOSIS — R32 Unspecified urinary incontinence: Secondary | ICD-10-CM | POA: Diagnosis not present

## 2021-08-19 DIAGNOSIS — J45909 Unspecified asthma, uncomplicated: Secondary | ICD-10-CM | POA: Diagnosis not present

## 2021-08-19 DIAGNOSIS — D631 Anemia in chronic kidney disease: Secondary | ICD-10-CM | POA: Diagnosis not present

## 2021-08-19 DIAGNOSIS — Z6839 Body mass index (BMI) 39.0-39.9, adult: Secondary | ICD-10-CM | POA: Diagnosis not present

## 2021-08-19 DIAGNOSIS — E782 Mixed hyperlipidemia: Secondary | ICD-10-CM | POA: Diagnosis not present

## 2021-08-19 DIAGNOSIS — F331 Major depressive disorder, recurrent, moderate: Secondary | ICD-10-CM | POA: Diagnosis not present

## 2021-08-19 DIAGNOSIS — E538 Deficiency of other specified B group vitamins: Secondary | ICD-10-CM | POA: Diagnosis not present

## 2021-08-19 DIAGNOSIS — E559 Vitamin D deficiency, unspecified: Secondary | ICD-10-CM | POA: Diagnosis not present

## 2021-08-19 DIAGNOSIS — M1990 Unspecified osteoarthritis, unspecified site: Secondary | ICD-10-CM | POA: Diagnosis not present

## 2021-08-19 DIAGNOSIS — L080 Pyoderma: Secondary | ICD-10-CM | POA: Diagnosis not present

## 2021-08-19 DIAGNOSIS — M109 Gout, unspecified: Secondary | ICD-10-CM | POA: Diagnosis not present

## 2021-08-19 DIAGNOSIS — N1832 Chronic kidney disease, stage 3b: Secondary | ICD-10-CM | POA: Diagnosis not present

## 2021-08-19 DIAGNOSIS — G4733 Obstructive sleep apnea (adult) (pediatric): Secondary | ICD-10-CM | POA: Diagnosis not present

## 2021-08-19 DIAGNOSIS — D509 Iron deficiency anemia, unspecified: Secondary | ICD-10-CM | POA: Diagnosis not present

## 2021-08-19 DIAGNOSIS — F5101 Primary insomnia: Secondary | ICD-10-CM | POA: Diagnosis not present

## 2021-08-19 DIAGNOSIS — F419 Anxiety disorder, unspecified: Secondary | ICD-10-CM | POA: Diagnosis not present

## 2021-08-19 DIAGNOSIS — E1165 Type 2 diabetes mellitus with hyperglycemia: Secondary | ICD-10-CM | POA: Diagnosis not present

## 2021-08-20 DIAGNOSIS — G4733 Obstructive sleep apnea (adult) (pediatric): Secondary | ICD-10-CM | POA: Diagnosis not present

## 2021-08-23 ENCOUNTER — Encounter: Payer: Self-pay | Admitting: Nurse Practitioner

## 2021-08-25 ENCOUNTER — Ambulatory Visit: Payer: HMO | Admitting: Nurse Practitioner

## 2021-09-01 ENCOUNTER — Ambulatory Visit: Payer: HMO | Admitting: Nurse Practitioner

## 2021-09-07 ENCOUNTER — Encounter: Payer: Self-pay | Admitting: Physician Assistant

## 2021-09-07 ENCOUNTER — Ambulatory Visit (INDEPENDENT_AMBULATORY_CARE_PROVIDER_SITE_OTHER): Payer: HMO | Admitting: Physician Assistant

## 2021-09-07 ENCOUNTER — Ambulatory Visit: Payer: HMO | Admitting: Nurse Practitioner

## 2021-09-07 DIAGNOSIS — I5032 Chronic diastolic (congestive) heart failure: Secondary | ICD-10-CM

## 2021-09-07 DIAGNOSIS — N1832 Chronic kidney disease, stage 3b: Secondary | ICD-10-CM | POA: Diagnosis not present

## 2021-09-07 DIAGNOSIS — I10 Essential (primary) hypertension: Secondary | ICD-10-CM

## 2021-09-07 DIAGNOSIS — E1165 Type 2 diabetes mellitus with hyperglycemia: Secondary | ICD-10-CM

## 2021-09-07 DIAGNOSIS — R300 Dysuria: Secondary | ICD-10-CM | POA: Diagnosis not present

## 2021-09-07 DIAGNOSIS — Z0001 Encounter for general adult medical examination with abnormal findings: Secondary | ICD-10-CM

## 2021-09-07 NOTE — Progress Notes (Signed)
[facility] [place], [place] [address]  Internal MEDICINE  Office Visit Note  Patient Name: Frances Greene  [id number]  [id number]  Date of Service: 09/16/2021  Chief Complaint  Patient presents with   Medicare Wellness   Diabetes   Hypertension     HPI Pt is here for routine health maintenance examination -No new concerns today -Eye exam in Jan -hasnt taken second dose of bp meds yet today which may be why it is elevated in office and was advised to monitor it closely -Sees Dr. Carney Bern thyroid, and nephrology is monitoring other labs -A1c done last visit and is too soon to recheck and was in prediabetic range -Followed by pulmonology for OSA on CPAP  Current Medication: Outpatient Encounter Medications as of 09/07/2021  Medication Sig   acetaminophen (TYLENOL) 325 MG tablet Take 650 mg by mouth every 6 (six) hours as needed.   AMBULATORY NON FORMULARY MEDICATION Medication Name: geni kot natural veggie laxitive   atorvastatin (LIPITOR) 10 MG tablet TAKE ONE TABLET BY MOUTH ONCE DAILY   betamethasone dipropionate 0.05 % cream Apply topically 2 (two) times daily.   bisoprolol (ZEBETA) 5 MG tablet Take 1 tablet (5 mg total) by mouth daily.   Blood Glucose Monitoring Suppl (ONETOUCH VERIO REFLECT) w/Device KIT Use as directed for once daily Dx E11.65   Cholecalciferol (VITAMIN D3) 1.25 MG (50000 UT) CAPS Take by mouth daily.   cinacalcet (SENSIPAR) 30 MG tablet    citalopram (CELEXA) 10 MG tablet Take 1 tablet (10 mg total) by mouth daily.   empagliflozin (JARDIANCE) 10 MG TABS tablet Take 1 tablet (10 mg total) by mouth daily.   febuxostat (ULORIC) 40 MG tablet Take 1 tablet (40 mg total) by mouth daily.   fluticasone-salmeterol (ADVAIR) 100-50 MCG/ACT AEPB Inhale 1 puff into the lungs 2 (two) times daily.   folic acid (FOLVITE) 1 MG tablet TAKE ONE TABLET BY MOUTH ONCE DAILY   furosemide (LASIX) 80 MG tablet Take 1 tablet (80 mg total)  by mouth every morning.   Garlic 0174 MG CAPS Take by mouth.   glucose blood (ONETOUCH VERIO) test strip Use as directed once a daily   hydrALAZINE (APRESOLINE) 25 MG tablet TAKE ONE TABLET BY MOUTH THREE TIMES DAILY   HYDROcodone-acetaminophen (NORCO/VICODIN) 5-325 MG tablet Take 1 tablet by mouth every 6 (six) hours as needed for moderate pain or severe pain.   Insulin Pen Needle (PEN NEEDLES) 32G X 4 MM MISC 1 Units by Does not apply route daily. Use with victoza injections daily.   ketoconazole (NIZORAL) 2 % shampoo SHAMPOO AS DIRECTED   latanoprost (XALATAN) 0.005 % ophthalmic solution latanoprost 0.005 % eye drops  INSTILL 1 DROP INTO EACH EYE IN THE EVENING   liraglutide (VICTOZA) 18 MG/3ML SOPN INJECT 1.8 MG ONCE DAILY SUBCUTANEOUSLY   meloxicam (MOBIC) 7.5 MG tablet TAKE 1 TABLET BY MOUTH ONCE DAILY FOR  GOUT  AND  ARTHRITIS   mirabegron ER (MYRBETRIQ) 50 MG TB24 tablet Take 1 tablet (50 mg total) by mouth daily.   montelukast (SINGULAIR) 10 MG tablet Take 1 tablet (10 mg total) by mouth daily.   NON FORMULARY CPAP pt uses [facility] for CPAP   Omega-3 Fatty Acids (FISH OIL PO) Take by mouth.   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   senna (  SENOKOT) 8.6 MG TABS tablet Take 1 tablet by mouth.   Sulfacetamide Sodium (SODIUM SULFACETAMIDE) 10 % SHAM Apply topically.   tizanidine (ZANAFLEX) 2 MG capsule Take one tab po qhs for spasm   traZODone (DESYREL) 50 MG tablet Take 0.5-1 tablets (25-50 mg total) by mouth at bedtime as needed for sleep.   Vitamin D, Ergocalciferol, (DRISDOL) 1.25 MG (50000 UNIT) CAPS capsule TAKE ONE CAPSULE BY MOUTH ONCE WEEKLY ON MONDAY   No facility-administered encounter medications on file as of 09/07/2021.    Surgical History: Past Surgical History:  Procedure Laterality Date   ABDOMINAL HYSTERECTOMY     BREAST BIOPSY  Left 1989   neg   CATARACT EXTRACTION W/PHACO Right 05/21/2015   Procedure: CATARACT EXTRACTION PHACO AND INTRAOCULAR LENS PLACEMENT (IOC);  Surgeon: Milus Height, MD;  Location: [facility];  Service: Ophthalmology;  Laterality: Right;  [place]: AP%: 10.0 CDE: 9.53   CATARACT EXTRACTION W/PHACO Left 12/31/2020   Procedure: CATARACT EXTRACTION PHACO AND INTRAOCULAR LENS PLACEMENT (IOC) LEFT DIABETIC 4.13 01:00.9;  Surgeon: Leandrew Koyanagi, MD;  Location: [facility];  Service: Ophthalmology;  Laterality: Left;   COLONOSCOPY     JOINT REPLACEMENT Bilateral    knee   PARS PLANA VITRECTOMY Right 05/21/2015   Procedure: PARS PLANA VITRECTOMY WITH 25 GAUGE;  Surgeon: Milus Height, MD;  Location: [facility];  Service: Ophthalmology;  Laterality: Right;  Lot # [id number] Endo laser: Watts: 300 Pulse Duration: 200 Total Pulses:143    shoulder surgery      Medical History: Past Medical History:  Diagnosis Date   Arthritis    Asthma    Bronchitis    CHF (congestive heart failure) (HCC)    Chronic kidney disease    decreased kidney function   Diabetes mellitus without complication (HCC)    Gout    Heart murmur    Hypertension    Multiple allergies    Shortness of breath dyspnea    w/ exertion   Sleep apnea    CPAP   Swelling of both lower extremities    Wheezing     Family History: Family History  Problem Relation Age of Onset   Breast cancer Cousin    Breast cancer Other    Hypertension Mother    Diabetes Mother    Hypertension Father    Diabetes Father    Alcohol abuse Father    Aneurysm Sister    Alcohol abuse Sister    Stroke Sister        died from stroke   Hepatitis Brother    Hypertension Sister        dialysis   Diabetes Sister    Alcoholism Brother    Alcoholism Brother        dies from head injury from a fall.  not an alcoholic,   Cancer Brother    Diabetes Brother    Hypertension Brother    Diabetes Brother    Cancer Brother    Hypertension  Brother    Stroke Brother    Alcoholism Brother       Review of Systems  Constitutional:  Negative for chills, fatigue and unexpected weight change.  HENT:  Negative for congestion, postnasal drip, rhinorrhea, sneezing and sore throat.   Eyes:  Negative for redness.  Respiratory:  Negative for cough, chest tightness and shortness of breath.   Cardiovascular:  Negative for chest pain and palpitations.  Gastrointestinal:  Negative for abdominal pain, constipation, diarrhea, nausea and vomiting.  Genitourinary:  Negative  for dysuria and frequency.  Musculoskeletal:  Negative for arthralgias, back pain, joint swelling and neck pain.  Skin:  Negative for rash.  Neurological: Negative.  Negative for tremors and numbness.  Hematological:  Negative for adenopathy. Does not bruise/bleed easily.  Psychiatric/Behavioral:  Negative for behavioral problems (Depression), sleep disturbance and suicidal ideas. The patient is not nervous/anxious.      Vital Signs: BP (!) 150/60   Pulse 63   Temp 97.8 F (36.6 C)   Resp 16   Ht 5' 3" (1.6 m)   Wt 221 lb (100.2 kg)   SpO2 95%   BMI 39.15 kg/m    Physical Exam Vitals and nursing note reviewed.  Constitutional:      General: She is not in acute distress.    Appearance: Normal appearance. She is well-developed. She is obese. She is not diaphoretic.  HENT:     Head: Normocephalic and atraumatic.     Mouth/Throat:     Pharynx: No oropharyngeal exudate.  Eyes:     Pupils: Pupils are equal, round, and reactive to light.  Neck:     Thyroid: No thyromegaly.     Vascular: No JVD.     Trachea: No tracheal deviation.  Cardiovascular:     Rate and Rhythm: Normal rate and regular rhythm.     Heart sounds: Normal heart sounds. No murmur heard.    No friction rub. No gallop.  Pulmonary:     Effort: Pulmonary effort is normal. No respiratory distress.     Breath sounds: No wheezing or rales.  Chest:     Chest wall: No tenderness.  Abdominal:      General: Bowel sounds are normal.     Palpations: Abdomen is soft.     Tenderness: There is no abdominal tenderness.  Musculoskeletal:        General: Normal range of motion.     Cervical back: Normal range of motion and neck supple.  Lymphadenopathy:     Cervical: No cervical adenopathy.  Skin:    General: Skin is warm and dry.  Neurological:     Mental Status: She is alert and oriented to person, place, and time.     Cranial Nerves: No cranial nerve deficit.  Psychiatric:        Behavior: Behavior normal.        Thought Content: Thought content normal.        Judgment: Judgment normal.      LABS: Recent Results (from the past 2160 hour(s))  POCT glycosylated hemoglobin (Hb A1C)     Status: Abnormal   Collection Time: 07/02/21  3:55 PM  Result Value Ref Range   Hemoglobin A1C 5.7 (A) 4.0 - 5.6 %   HbA1c POC (<> result, manual entry)     HbA1c, POC (prediabetic range)     HbA1c, POC (controlled diabetic range)    UA/M w/rflx Culture, Routine     Status: Abnormal   Collection Time: 09/07/21  4:12 PM   Specimen: Urine   Urine  Result Value Ref Range   Specific Gravity, UA 1.013 1.005 - 1.030   pH, UA 6.0 5.0 - 7.5   Color, UA Yellow Yellow   Appearance Ur Clear Clear   Leukocytes,UA Negative Negative   Protein,UA 1+ (A) Negative/Trace   Glucose, UA 2+ (A) Negative   Ketones, UA Negative Negative   RBC, UA Negative Negative   Bilirubin, UA Negative Negative   Urobilinogen, Ur 0.2 0.2 - 1.0 mg/dL  Nitrite, UA Negative Negative   Microscopic Examination See below:     Comment: Microscopic was indicated and was performed.   Urinalysis Reflex Comment     Comment: This specimen will not reflex to a Urine Culture.  Microscopic Examination     Status: None   Collection Time: 09/07/21  4:12 PM   Urine  Result Value Ref Range   WBC, UA None seen 0 - 5 /hpf   RBC, Urine None seen 0 - 2 /hpf   Epithelial Cells (non renal) 0-10 0 - 10 /hpf   Casts None seen None seen  /lpf   Bacteria, UA None seen None seen/Few       Assessment/Plan: 1. Encounter for general adult medical examination with abnormal findings CPE performed, up to date on PHM  2. Essential hypertension, benign Elevated in office, but hasnt taken next dose of her medication yet and will monitor  3. Type 2 diabetes mellitus with hyperglycemia, without long-term current use of insulin (HCC) Not yet due for A1c repeat, but has been controlled in prediabetic range and will continue current medications  4. Stage 3b chronic kidney disease ([place]) Followed by nephrology  5. Chronic heart failure with preserved ejection fraction ([place]) Followed by cardiology  6. Dysuria - UA/M w/rflx Culture, Routine   General Counseling: Annistyn verbalizes understanding of the findings of todays visit and agrees with plan of treatment. I have discussed any further diagnostic evaluation that may be needed or ordered today. We also reviewed her medications today. she has been encouraged to call the office with any questions or concerns that should arise related to todays visit.    Counseling:    Orders Placed This Encounter  Procedures   Microscopic Examination   UA/M w/rflx Culture, Routine    No orders of the defined types were placed in this encounter.   This patient was seen by Drema Dallas, PA-C in collaboration with Dr. Clayborn Bigness as a part of collaborative care agreement.  Total time spent:35 Minutes  Time spent includes review of chart, medications, test results, and follow up plan with the patient.     Lavera Guise, MD  Internal Medicine

## 2021-09-08 ENCOUNTER — Ambulatory Visit: Payer: HMO | Admitting: Internal Medicine

## 2021-09-08 LAB — UA/M W/RFLX CULTURE, ROUTINE
Bilirubin, UA: NEGATIVE
Ketones, UA: NEGATIVE
Leukocytes,UA: NEGATIVE
Nitrite, UA: NEGATIVE
RBC, UA: NEGATIVE
Specific Gravity, UA: 1.013 (ref 1.005–1.030)
Urobilinogen, Ur: 0.2 mg/dL (ref 0.2–1.0)
pH, UA: 6 (ref 5.0–7.5)

## 2021-09-08 LAB — MICROSCOPIC EXAMINATION
Bacteria, UA: NONE SEEN
Casts: NONE SEEN /lpf
RBC, Urine: NONE SEEN /hpf (ref 0–2)
WBC, UA: NONE SEEN /hpf (ref 0–5)

## 2021-09-10 ENCOUNTER — Ambulatory Visit: Payer: HMO | Admitting: Nurse Practitioner

## 2021-09-20 DIAGNOSIS — G4733 Obstructive sleep apnea (adult) (pediatric): Secondary | ICD-10-CM | POA: Diagnosis not present

## 2021-10-01 ENCOUNTER — Telehealth: Payer: Self-pay | Admitting: *Deleted

## 2021-10-01 NOTE — Telephone Encounter (Signed)
Patient would like and earlier time for her appointment on the 19th or she needs to reschedule.

## 2021-10-06 ENCOUNTER — Other Ambulatory Visit: Payer: Self-pay

## 2021-10-06 ENCOUNTER — Ambulatory Visit (INDEPENDENT_AMBULATORY_CARE_PROVIDER_SITE_OTHER): Payer: HMO | Admitting: Nurse Practitioner

## 2021-10-06 ENCOUNTER — Other Ambulatory Visit: Payer: HMO

## 2021-10-06 ENCOUNTER — Ambulatory Visit: Payer: HMO | Admitting: Oncology

## 2021-10-06 ENCOUNTER — Encounter: Payer: Self-pay | Admitting: Nurse Practitioner

## 2021-10-06 VITALS — BP 152/66 | HR 70 | Temp 97.6°F | Resp 16 | Ht 63.0 in | Wt 218.8 lb

## 2021-10-06 DIAGNOSIS — I5032 Chronic diastolic (congestive) heart failure: Secondary | ICD-10-CM

## 2021-10-06 DIAGNOSIS — N1832 Chronic kidney disease, stage 3b: Secondary | ICD-10-CM | POA: Diagnosis not present

## 2021-10-06 DIAGNOSIS — E1159 Type 2 diabetes mellitus with other circulatory complications: Secondary | ICD-10-CM

## 2021-10-06 DIAGNOSIS — I10 Essential (primary) hypertension: Secondary | ICD-10-CM | POA: Diagnosis not present

## 2021-10-06 DIAGNOSIS — E1165 Type 2 diabetes mellitus with hyperglycemia: Secondary | ICD-10-CM | POA: Diagnosis not present

## 2021-10-06 LAB — POCT GLYCOSYLATED HEMOGLOBIN (HGB A1C): Hemoglobin A1C: 6.6 % — AB (ref 4.0–5.6)

## 2021-10-06 NOTE — Progress Notes (Signed)
[facility] [place], [place] [address]  Internal MEDICINE  Office Visit Note  Patient Name: Frances Greene  [id number]  [id number]  Date of Service: 10/06/2021  Chief Complaint  Patient presents with   Follow-up   Diabetes   Hypertension    HPI Lyris presents for a follow up visit for diabetes and hypertension.  Diabetes -- A1C is 6.6 today. Which is elevated from 5.7 in June. Has been eating more pasta and bread Hypertension -- blood pressure remained elevated when rechecked, patient reports she has not taken her midday dose of hydralazine. Reports BP has been good at home, under 140/90 typically Finished chemo and radiation, hair is growing back.  CKD -- creatinine was trending up over the past few metabolic panels. Last lab was from may.    Current Medication: Outpatient Encounter Medications as of 10/06/2021  Medication Sig   acetaminophen (TYLENOL) 325 MG tablet Take 650 mg by mouth every 6 (six) hours as needed. (Patient not taking: Reported on 10/27/2021)   AMBULATORY NON FORMULARY MEDICATION Medication Name: geni kot natural veggie laxitive   atorvastatin (LIPITOR) 10 MG tablet TAKE ONE TABLET BY MOUTH ONCE DAILY   betamethasone dipropionate 0.05 % cream Apply topically 2 (two) times daily. (Patient not taking: Reported on 10/27/2021)   Blood Glucose Monitoring Suppl (ONETOUCH VERIO REFLECT) w/Device KIT Use as directed for once daily Dx E11.65   Cholecalciferol (VITAMIN D3) 1.25 MG (50000 UT) CAPS Take by mouth daily.   cinacalcet (SENSIPAR) 30 MG tablet    citalopram (CELEXA) 10 MG tablet Take 1 tablet (10 mg total) by mouth daily.   fluticasone-salmeterol (ADVAIR) 100-50 MCG/ACT AEPB Inhale 1 puff into the lungs 2 (two) times daily.   folic acid (FOLVITE) 1 MG tablet TAKE ONE TABLET BY MOUTH ONCE DAILY   Garlic 6967 MG CAPS Take by mouth.   glucose blood (ONETOUCH VERIO) test strip Use as directed once a daily   hydrALAZINE (APRESOLINE)  25 MG tablet TAKE ONE TABLET BY MOUTH THREE TIMES DAILY   HYDROcodone-acetaminophen (NORCO/VICODIN) 5-325 MG tablet Take 1 tablet by mouth every 6 (six) hours as needed for moderate pain or severe pain. (Patient not taking: Reported on 10/27/2021)   Insulin Pen Needle (PEN NEEDLES) 32G X 4 MM MISC 1 Units by Does not apply route daily. Use with victoza injections daily.   ketoconazole (NIZORAL) 2 % shampoo SHAMPOO AS DIRECTED   latanoprost (XALATAN) 0.005 % ophthalmic solution latanoprost 0.005 % eye drops  INSTILL 1 DROP INTO EACH EYE IN THE EVENING   meloxicam (MOBIC) 7.5 MG tablet TAKE 1 TABLET BY MOUTH ONCE DAILY FOR  GOUT  AND  ARTHRITIS   mirabegron ER (MYRBETRIQ) 50 MG TB24 tablet Take 1 tablet (50 mg total) by mouth daily. (Patient not taking: Reported on 10/27/2021)   montelukast (SINGULAIR) 10 MG tablet Take 1 tablet (10 mg total) by mouth daily.   NON FORMULARY CPAP pt uses [facility] for CPAP   Omega-3 Fatty Acids (FISH OIL PO) Take by mouth.   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 (Patient not taking: Reported on 10/27/2021)   senna (SENOKOT) 8.6 MG TABS tablet Take 1 tablet by mouth.   Sulfacetamide Sodium (SODIUM SULFACETAMIDE) 10 % SHAM Apply topically.   tizanidine (ZANAFLEX) 2 MG capsule Take one tab po qhs for spasm (  Patient not taking: Reported on 10/27/2021)   traZODone (DESYREL) 50 MG tablet Take 0.5-1 tablets (25-50 mg total) by mouth at bedtime as needed for sleep. (Patient not taking: Reported on 10/27/2021)   Vitamin D, Ergocalciferol, (DRISDOL) 1.25 MG (50000 UNIT) CAPS capsule TAKE ONE CAPSULE BY MOUTH ONCE WEEKLY ON MONDAY   [DISCONTINUED] bisoprolol (ZEBETA) 5 MG tablet Take 1 tablet (5 mg total) by mouth daily.   [DISCONTINUED] empagliflozin (JARDIANCE) 10 MG TABS tablet Take 1 tablet (10 mg total) by mouth daily.    [DISCONTINUED] febuxostat (ULORIC) 40 MG tablet Take 1 tablet (40 mg total) by mouth daily.   [DISCONTINUED] furosemide (LASIX) 80 MG tablet Take 1 tablet (80 mg total) by mouth every morning.   [DISCONTINUED] liraglutide (VICTOZA) 18 MG/3ML SOPN INJECT 1.8 MG ONCE DAILY SUBCUTANEOUSLY   No facility-administered encounter medications on file as of 10/06/2021.    Surgical History: Past Surgical History:  Procedure Laterality Date   ABDOMINAL HYSTERECTOMY     BREAST BIOPSY Left 1989   neg   CATARACT EXTRACTION W/PHACO Right 05/21/2015   Procedure: CATARACT EXTRACTION PHACO AND INTRAOCULAR LENS PLACEMENT (IOC);  Surgeon: Marcelene Butte, MD;  Location: [facility];  Service: Ophthalmology;  Laterality: Right;  [place]: AP%: 10.0 CDE: 9.53   CATARACT EXTRACTION W/PHACO Left 12/31/2020   Procedure: CATARACT EXTRACTION PHACO AND INTRAOCULAR LENS PLACEMENT (IOC) LEFT DIABETIC 4.13 01:00.9;  Surgeon: Lockie Mola, MD;  Location: [facility];  Service: Ophthalmology;  Laterality: Left;   COLONOSCOPY     JOINT REPLACEMENT Bilateral    knee   PARS PLANA VITRECTOMY Right 05/21/2015   Procedure: PARS PLANA VITRECTOMY WITH 25 GAUGE;  Surgeon: Marcelene Butte, MD;  Location: [facility];  Service: Ophthalmology;  Laterality: Right;  Lot # [id number] Endo laser: Watts: 300 Pulse Duration: 200 Total Pulses:143    shoulder surgery      Medical History: Past Medical History:  Diagnosis Date   Arthritis    Asthma    Bronchitis    CHF (congestive heart failure) (HCC)    Chronic kidney disease    decreased kidney function   Diabetes mellitus without complication (HCC)    Gout    Heart murmur    Hypertension    Multiple allergies    Shortness of breath dyspnea    w/ exertion   Sleep apnea    CPAP   Swelling of both lower extremities    Wheezing     Family History: Family History  Problem Relation Age of Onset   Breast cancer Cousin    Breast cancer Other    Hypertension Mother     Diabetes Mother    Hypertension Father    Diabetes Father    Alcohol abuse Father    Aneurysm Sister    Alcohol abuse Sister    Stroke Sister        died from stroke   Hepatitis Brother    Hypertension Sister        dialysis   Diabetes Sister    Alcoholism Brother    Alcoholism Brother        dies from head injury from a fall.  not an alcoholic,   Cancer Brother    Diabetes Brother    Hypertension Brother    Diabetes Brother    Cancer Brother    Hypertension Brother    Stroke Brother    Alcoholism Brother     Social History   Socioeconomic History   Marital status: Divorced  Spouse name: Not on file   Number of children: Not on file   Years of education: Not on file   Highest education level: Not on file  Occupational History   Not on file  Tobacco Use   Smoking status: Former    Types: Cigarettes   Smokeless tobacco: Never   Tobacco comments:    4 cigarettes quit 20 years ago  Vaping Use   Vaping Use: Never used  Substance and Sexual Activity   Alcohol use: No   Drug use: Never   Sexual activity: Not on file  Other Topics Concern   Not on file  Social History Narrative   Not on file   Social Determinants of Health   Financial Resource Strain: Low Risk  (12/04/2020)   Overall Financial Resource Strain (CARDIA)    Difficulty of Paying Living Expenses: Not very hard  Food Insecurity: Not on file  Transportation Needs: Not on file  Physical Activity: Not on file  Stress: Not on file  Social Connections: Not on file  Intimate Partner Violence: Not on file      Review of Systems  Constitutional:  Negative for chills, fatigue and unexpected weight change.  HENT:  Negative for congestion, rhinorrhea, sneezing and sore throat.   Eyes:  Negative for redness.  Respiratory:  Negative for cough, chest tightness and shortness of breath.   Cardiovascular:  Negative for chest pain and palpitations.  Gastrointestinal:  Negative for abdominal pain,  constipation, diarrhea, nausea and vomiting.  Genitourinary:  Negative for dysuria and frequency.  Musculoskeletal:  Negative for arthralgias, back pain, joint swelling and neck pain.  Skin:  Negative for rash.  Neurological: Negative.  Negative for tremors and numbness.  Hematological:  Negative for adenopathy. Does not bruise/bleed easily.  Psychiatric/Behavioral:  Negative for behavioral problems (Depression), sleep disturbance and suicidal ideas. The patient is not nervous/anxious.     Vital Signs: BP (!) 152/66 Comment: 150/62  Pulse 70   Temp 97.6 F (36.4 C)   Resp 16   Ht $R'5\' 3"'yf$  (1.6 m)   Wt 218 lb 12.8 oz (99.2 kg)   SpO2 95%   BMI 38.76 kg/m    Physical Exam Vitals reviewed.  Constitutional:      General: She is not in acute distress.    Appearance: Normal appearance. She is obese. She is not ill-appearing.  HENT:     Head: Normocephalic and atraumatic.  Eyes:     Pupils: Pupils are equal, round, and reactive to light.  Cardiovascular:     Rate and Rhythm: Normal rate and regular rhythm.  Pulmonary:     Effort: Pulmonary effort is normal. No respiratory distress.  Neurological:     Mental Status: She is alert and oriented to person, place, and time.     Cranial Nerves: No cranial nerve deficit.     Coordination: Coordination normal.     Gait: Gait normal.  Psychiatric:        Mood and Affect: Mood normal.        Behavior: Behavior normal.        Assessment/Plan: 1. Type 2 diabetes mellitus with hyperglycemia, without long-term current use of insulin (HCC) Repeat metabolic panel. A1c elevated. Discussed diet and lifestyle modifications to help decrease her A1c. No changes in medications today, if next A1c is not improved, will discuss changing or adding medication. Follow up in 3 months to repeat A1c.  - POCT HgB A1C - CMP14+EGFR  2. Essential hypertension, benign BP  elevated but she has not taken her midday dose of hydralazine. No change in  medications, continue to monitor BP periodically at home and at office visits. Encouraged patient to call clinic if her blood pressure readings are persistently elevated even with her medication.   3. Stage 3b chronic kidney disease ([place]) Repeat metabolic panel to assess kidney function and electrolyte levels.  - CMP14+EGFR   General Counseling: stewart pimenta understanding of the findings of todays visit and agrees with plan of treatment. I have discussed any further diagnostic evaluation that may be needed or ordered today. We also reviewed her medications today. she has been encouraged to call the office with any questions or concerns that should arise related to todays visit.    Orders Placed This Encounter  Procedures   CMP14+EGFR   POCT HgB A1C    No orders of the defined types were placed in this encounter.   Return in about 3 months (around 01/05/2022) for F/U, Recheck A1C, Sheketa Ende PCP.   Total time spent:30 Minutes Time spent includes review of chart, medications, test results, and follow up plan with the patient.   [place] Controlled Substance Database was reviewed by me.  This patient was seen by Jonetta Osgood, FNP-C in collaboration with Dr. Clayborn Bigness as a part of collaborative care agreement.   Madhavi Hamblen R. Valetta Fuller, MSN, FNP-C Internal medicine

## 2021-10-14 ENCOUNTER — Telehealth: Payer: Self-pay | Admitting: Nurse Practitioner

## 2021-10-14 ENCOUNTER — Other Ambulatory Visit: Payer: Self-pay

## 2021-10-14 MED ORDER — VICTOZA 18 MG/3ML ~~LOC~~ SOPN
PEN_INJECTOR | SUBCUTANEOUS | 3 refills | Status: DC
Start: 2021-10-14 — End: 2022-03-17

## 2021-10-14 NOTE — Telephone Encounter (Signed)
Patient called regarding her prescription. Sent secure chat to Desha to call her back-Toni

## 2021-10-19 ENCOUNTER — Encounter: Payer: Self-pay | Admitting: Oncology

## 2021-10-20 DIAGNOSIS — G4733 Obstructive sleep apnea (adult) (pediatric): Secondary | ICD-10-CM | POA: Diagnosis not present

## 2021-10-26 ENCOUNTER — Other Ambulatory Visit: Payer: Self-pay

## 2021-10-26 DIAGNOSIS — H35372 Puckering of macula, left eye: Secondary | ICD-10-CM | POA: Diagnosis not present

## 2021-10-26 DIAGNOSIS — Z961 Presence of intraocular lens: Secondary | ICD-10-CM | POA: Diagnosis not present

## 2021-10-26 DIAGNOSIS — E119 Type 2 diabetes mellitus without complications: Secondary | ICD-10-CM | POA: Diagnosis not present

## 2021-10-26 DIAGNOSIS — D509 Iron deficiency anemia, unspecified: Secondary | ICD-10-CM

## 2021-10-26 DIAGNOSIS — H401122 Primary open-angle glaucoma, left eye, moderate stage: Secondary | ICD-10-CM | POA: Diagnosis not present

## 2021-10-27 ENCOUNTER — Encounter: Payer: Self-pay | Admitting: Oncology

## 2021-10-27 ENCOUNTER — Other Ambulatory Visit: Payer: HMO

## 2021-10-27 ENCOUNTER — Inpatient Hospital Stay (HOSPITAL_BASED_OUTPATIENT_CLINIC_OR_DEPARTMENT_OTHER): Payer: HMO | Admitting: Oncology

## 2021-10-27 ENCOUNTER — Inpatient Hospital Stay: Payer: HMO | Attending: Oncology

## 2021-10-27 ENCOUNTER — Ambulatory Visit: Payer: HMO | Admitting: Oncology

## 2021-10-27 VITALS — BP 135/54 | HR 65 | Temp 97.0°F | Resp 18 | Wt 218.4 lb

## 2021-10-27 DIAGNOSIS — E1122 Type 2 diabetes mellitus with diabetic chronic kidney disease: Secondary | ICD-10-CM | POA: Diagnosis not present

## 2021-10-27 DIAGNOSIS — Z7984 Long term (current) use of oral hypoglycemic drugs: Secondary | ICD-10-CM | POA: Insufficient documentation

## 2021-10-27 DIAGNOSIS — Z7951 Long term (current) use of inhaled steroids: Secondary | ICD-10-CM | POA: Diagnosis not present

## 2021-10-27 DIAGNOSIS — G4730 Sleep apnea, unspecified: Secondary | ICD-10-CM | POA: Diagnosis not present

## 2021-10-27 DIAGNOSIS — D631 Anemia in chronic kidney disease: Secondary | ICD-10-CM | POA: Diagnosis not present

## 2021-10-27 DIAGNOSIS — N189 Chronic kidney disease, unspecified: Secondary | ICD-10-CM | POA: Insufficient documentation

## 2021-10-27 DIAGNOSIS — Z79899 Other long term (current) drug therapy: Secondary | ICD-10-CM | POA: Diagnosis not present

## 2021-10-27 DIAGNOSIS — I130 Hypertensive heart and chronic kidney disease with heart failure and stage 1 through stage 4 chronic kidney disease, or unspecified chronic kidney disease: Secondary | ICD-10-CM | POA: Insufficient documentation

## 2021-10-27 DIAGNOSIS — I509 Heart failure, unspecified: Secondary | ICD-10-CM | POA: Diagnosis not present

## 2021-10-27 DIAGNOSIS — Z803 Family history of malignant neoplasm of breast: Secondary | ICD-10-CM | POA: Diagnosis not present

## 2021-10-27 DIAGNOSIS — N183 Chronic kidney disease, stage 3 unspecified: Secondary | ICD-10-CM | POA: Diagnosis not present

## 2021-10-27 DIAGNOSIS — Z7985 Long-term (current) use of injectable non-insulin antidiabetic drugs: Secondary | ICD-10-CM | POA: Diagnosis not present

## 2021-10-27 DIAGNOSIS — J45909 Unspecified asthma, uncomplicated: Secondary | ICD-10-CM | POA: Diagnosis not present

## 2021-10-27 DIAGNOSIS — D509 Iron deficiency anemia, unspecified: Secondary | ICD-10-CM | POA: Diagnosis not present

## 2021-10-27 LAB — IRON AND TIBC
Iron: 60 ug/dL (ref 28–170)
Saturation Ratios: 21 % (ref 10.4–31.8)
TIBC: 291 ug/dL (ref 250–450)
UIBC: 231 ug/dL

## 2021-10-27 LAB — CBC WITH DIFFERENTIAL/PLATELET
Abs Immature Granulocytes: 0.02 10*3/uL (ref 0.00–0.07)
Basophils Absolute: 0 10*3/uL (ref 0.0–0.1)
Basophils Relative: 1 %
Eosinophils Absolute: 0.2 10*3/uL (ref 0.0–0.5)
Eosinophils Relative: 3 %
HCT: 40.8 % (ref 36.0–46.0)
Hemoglobin: 13.3 g/dL (ref 12.0–15.0)
Immature Granulocytes: 0 %
Lymphocytes Relative: 21 %
Lymphs Abs: 1.1 10*3/uL (ref 0.7–4.0)
MCH: 28.1 pg (ref 26.0–34.0)
MCHC: 32.6 g/dL (ref 30.0–36.0)
MCV: 86.1 fL (ref 80.0–100.0)
Monocytes Absolute: 0.5 10*3/uL (ref 0.1–1.0)
Monocytes Relative: 10 %
Neutro Abs: 3.3 10*3/uL (ref 1.7–7.7)
Neutrophils Relative %: 65 %
Platelets: 134 10*3/uL — ABNORMAL LOW (ref 150–400)
RBC: 4.74 MIL/uL (ref 3.87–5.11)
RDW: 13.2 % (ref 11.5–15.5)
WBC: 5.1 10*3/uL (ref 4.0–10.5)
nRBC: 0 % (ref 0.0–0.2)

## 2021-10-27 LAB — COMPREHENSIVE METABOLIC PANEL
ALT: 10 U/L (ref 0–44)
AST: 12 U/L — ABNORMAL LOW (ref 15–41)
Albumin: 3.9 g/dL (ref 3.5–5.0)
Alkaline Phosphatase: 58 U/L (ref 38–126)
Anion gap: 7 (ref 5–15)
BUN: 31 mg/dL — ABNORMAL HIGH (ref 8–23)
CO2: 27 mmol/L (ref 22–32)
Calcium: 9.7 mg/dL (ref 8.9–10.3)
Chloride: 108 mmol/L (ref 98–111)
Creatinine, Ser: 1.71 mg/dL — ABNORMAL HIGH (ref 0.44–1.00)
GFR, Estimated: 31 mL/min — ABNORMAL LOW (ref >60)
Glucose, Bld: 142 mg/dL — ABNORMAL HIGH (ref 70–99)
Potassium: 3.4 mmol/L — ABNORMAL LOW (ref 3.5–5.1)
Sodium: 142 mmol/L (ref 135–145)
Total Bilirubin: 0.7 mg/dL (ref 0.3–1.2)
Total Protein: 6.6 g/dL (ref 6.5–8.1)

## 2021-10-27 LAB — VITAMIN B12: Vitamin B-12: 1912 pg/mL — ABNORMAL HIGH (ref 180–914)

## 2021-10-27 LAB — FERRITIN: Ferritin: 53 ng/mL (ref 11–307)

## 2021-10-27 NOTE — Progress Notes (Signed)
Hematology/Oncology Consult note [facility]  [phone] [phone]  Patient Care Team: Jonetta Osgood, NP as PCP - General (Nurse Practitioner) Alena Bills, [facility] as Pharmacist (Pharmacist)   Name of the patient: Frances Greene  [id number]  19-Feb-1945   Date of visit: 10/27/21  Diagnosis-anemia of chronic kidney disease  Chief complaint/ Reason for visit-routine follow-up of anemia of chronic kidney disease  Heme/Onc history: Patient is a 76 year old female with a past medical history significant for stage III CKD, CHF, hypertension, sleep apnea among other medical problems.  She has a history of anemia of chronic kidney disease but has not required any Retacrit yet.  Hemoglobin has remained stable around 10 since 2020.  No overt evidence of iron deficiency and she has not required any IV iron in the past.  She is on oral B12 for B12 deficiency.    Interval history-patient reports doing well overall.  She has not had anyNo recent hospitalizations or infections.  She follows up with Dr. Zollie Scale for her CKD.  ECOG PS- 1 Pain scale- 0   Review of systems- Review of Systems  Constitutional:  Positive for malaise/fatigue. Negative for chills, fever and weight loss.  HENT:  Negative for congestion, ear discharge and nosebleeds.   Eyes:  Negative for blurred vision.  Respiratory:  Negative for cough, hemoptysis, sputum production, shortness of breath and wheezing.   Cardiovascular:  Negative for chest pain, palpitations, orthopnea and claudication.  Gastrointestinal:  Negative for abdominal pain, blood in stool, constipation, diarrhea, heartburn, melena, nausea and vomiting.  Genitourinary:  Negative for dysuria, flank pain, frequency, hematuria and urgency.  Musculoskeletal:  Negative for back pain, joint pain and myalgias.  Skin:  Negative for rash.  Neurological:  Negative for dizziness, tingling, focal weakness, seizures,  weakness and headaches.  Endo/Heme/Allergies:  Does not bruise/bleed easily.  Psychiatric/Behavioral:  Negative for depression and suicidal ideas. The patient does not have insomnia.       Allergies  Allergen Reactions   Ramipril Cough and Other (See Comments)    Other reaction(s): Cough Other reaction(s): Cough Other reaction(s): Other (See Comments) Other reaction(s): Cough Other reaction(s): Cough Other reaction(s): Cough      Past Medical History:  Diagnosis Date   Arthritis    Asthma    Bronchitis    CHF (congestive heart failure) ([place])    Chronic kidney disease    decreased kidney function   Diabetes mellitus without complication (HCC)    Gout    Heart murmur    Hypertension    Multiple allergies    Shortness of breath dyspnea    w/ exertion   Sleep apnea    CPAP   Swelling of both lower extremities    Wheezing      Past Surgical History:  Procedure Laterality Date   ABDOMINAL HYSTERECTOMY     BREAST BIOPSY Left 1989   neg   CATARACT EXTRACTION W/PHACO Right 05/21/2015   Procedure: CATARACT EXTRACTION PHACO AND INTRAOCULAR LENS PLACEMENT ([place]);  Surgeon: Milus Height, MD;  Location: [facility];  Service: Ophthalmology;  Laterality: Right;  [place]: AP%: 10.0 CDE: 9.53   CATARACT EXTRACTION W/PHACO Left 12/31/2020   Procedure: CATARACT EXTRACTION PHACO AND INTRAOCULAR LENS PLACEMENT ([facility]) LEFT DIABETIC 4.13 01:00.9;  Surgeon: Leandrew Koyanagi, MD;  Location: [place];  Service: Ophthalmology;  Laterality: Left;   COLONOSCOPY     JOINT REPLACEMENT Bilateral    knee   PARS PLANA VITRECTOMY Right 05/21/2015  Procedure: PARS PLANA VITRECTOMY WITH 25 GAUGE;  Surgeon: Milus Height, MD;  Location: [facility];  Service: Ophthalmology;  Laterality: Right;  Lot # [id number] H Endo laser: Watts: 300 Pulse Duration: 200 Total Pulses:143    shoulder surgery      Social History   Socioeconomic History   Marital status: Divorced    Spouse name: Not on  file   Number of children: Not on file   Years of education: Not on file   Highest education level: Not on file  Occupational History   Not on file  Tobacco Use   Smoking status: Former    Types: Cigarettes   Smokeless tobacco: Never   Tobacco comments:    4 cigarettes quit 20 years ago  Vaping Use   Vaping Use: Never used  Substance and Sexual Activity   Alcohol use: No   Drug use: Never   Sexual activity: Not on file  Other Topics Concern   Not on file  Social History Narrative   Not on file   Social Determinants of Health   Financial Resource Strain: Low Risk  (12/04/2020)   Overall Financial Resource Strain (CARDIA)    Difficulty of Paying Living Expenses: Not very hard  Food Insecurity: Not on file  Transportation Needs: Not on file  Physical Activity: Not on file  Stress: Not on file  Social Connections: Not on file  Intimate Partner Violence: Not on file    Family History  Problem Relation Age of Onset   Breast cancer Cousin    Breast cancer Other    Hypertension Mother    Diabetes Mother    Hypertension Father    Diabetes Father    Alcohol abuse Father    Aneurysm Sister    Alcohol abuse Sister    Stroke Sister        died from stroke   Hepatitis Brother    Hypertension Sister        dialysis   Diabetes Sister    Alcoholism Brother    Alcoholism Brother        dies from head injury from a fall.  not an alcoholic,   Cancer Brother    Diabetes Brother    Hypertension Brother    Diabetes Brother    Cancer Brother    Hypertension Brother    Stroke Brother    Alcoholism Brother      Current Outpatient Medications:    AMBULATORY NON FORMULARY MEDICATION, Medication Name: geni kot natural veggie laxitive, Disp: , Rfl:    atorvastatin (LIPITOR) 10 MG tablet, TAKE ONE TABLET BY MOUTH ONCE DAILY, Disp: 90 tablet, Rfl: 1   bisoprolol (ZEBETA) 5 MG tablet, Take 1 tablet (5 mg total) by mouth daily., Disp: 90 tablet, Rfl: 1   Blood Glucose Monitoring  Suppl (ONETOUCH VERIO REFLECT) w/Device KIT, Use as directed for once daily Dx E11.65, Disp: 1 kit, Rfl: 0   Cholecalciferol (VITAMIN D3) 1.25 MG (50000 UT) CAPS, Take by mouth daily., Disp: , Rfl:    cinacalcet (SENSIPAR) 30 MG tablet, , Disp: , Rfl:    citalopram (CELEXA) 10 MG tablet, Take 1 tablet (10 mg total) by mouth daily., Disp: 90 tablet, Rfl: 1   cyanocobalamin (VITAMIN B12) 100 MCG tablet, Take 100 mcg by mouth daily., Disp: , Rfl:    empagliflozin (JARDIANCE) 10 MG TABS tablet, Take 1 tablet (10 mg total) by mouth daily., Disp: 90 tablet, Rfl: 1   febuxostat (ULORIC) 40 MG tablet, Take 1 tablet (  40 mg total) by mouth daily., Disp: 90 tablet, Rfl: 1   ferrous sulfate 325 (65 FE) MG tablet, Take 325 mg by mouth daily with breakfast., Disp: , Rfl:    fluticasone-salmeterol (ADVAIR) 100-50 MCG/ACT AEPB, Inhale 1 puff into the lungs 2 (two) times daily., Disp: 60 each, Rfl: 3   folic acid (FOLVITE) 1 MG tablet, TAKE ONE TABLET BY MOUTH ONCE DAILY, Disp: 90 tablet, Rfl: 1   furosemide (LASIX) 80 MG tablet, Take 1 tablet (80 mg total) by mouth every morning., Disp: 90 tablet, Rfl: 1   Garlic 4801 MG CAPS, Take by mouth., Disp: , Rfl:    glucose blood (ONETOUCH VERIO) test strip, Use as directed once a daily, Disp: 300 strip, Rfl: 1   hydrALAZINE (APRESOLINE) 25 MG tablet, TAKE ONE TABLET BY MOUTH THREE TIMES DAILY, Disp: 270 tablet, Rfl: 1   Insulin Pen Needle (PEN NEEDLES) 32G X 4 MM MISC, 1 Units by Does not apply route daily. Use with victoza injections daily., Disp: 100 each, Rfl: 5   ketoconazole (NIZORAL) 2 % shampoo, SHAMPOO AS DIRECTED, Disp: 120 mL, Rfl: 3   latanoprost (XALATAN) 0.005 % ophthalmic solution, latanoprost 0.005 % eye drops  INSTILL 1 DROP INTO EACH EYE IN THE EVENING, Disp: , Rfl:    liraglutide (VICTOZA) 18 MG/3ML SOPN, INJECT 1.8 MG ONCE DAILY SUBCUTANEOUSLY, Disp: 270 mL, Rfl: 3   meloxicam (MOBIC) 7.5 MG tablet, TAKE 1 TABLET BY MOUTH ONCE DAILY FOR  GOUT  AND   ARTHRITIS, Disp: 90 tablet, Rfl: 1   montelukast (SINGULAIR) 10 MG tablet, Take 1 tablet (10 mg total) by mouth daily., Disp: 90 tablet, Rfl: 3   NON FORMULARY, CPAP pt uses [facility] for CPAP, Disp: , Rfl:    Omega-3 Fatty Acids (FISH OIL PO), Take by mouth., Disp: , Rfl:    OneTouch Delica Lancets 65V MISC, Use as directed once a daily DxE11.65, Disp: 300 each, Rfl: 1   senna (SENOKOT) 8.6 MG TABS tablet, Take 1 tablet by mouth., Disp: , Rfl:    Sulfacetamide Sodium (SODIUM SULFACETAMIDE) 10 % SHAM, Apply topically., Disp: , Rfl:    Vitamin D, Ergocalciferol, (DRISDOL) 1.25 MG (50000 UNIT) CAPS capsule, TAKE ONE CAPSULE BY MOUTH ONCE WEEKLY ON MONDAY, Disp: 12 capsule, Rfl: 1   acetaminophen (TYLENOL) 325 MG tablet, Take 650 mg by mouth every 6 (six) hours as needed. (Patient not taking: Reported on 10/27/2021), Disp: , Rfl:    betamethasone dipropionate 0.05 % cream, Apply topically 2 (two) times daily. (Patient not taking: Reported on 10/27/2021), Disp: 45 g, Rfl: 1   HYDROcodone-acetaminophen (NORCO/VICODIN) 5-325 MG tablet, Take 1 tablet by mouth every 6 (six) hours as needed for moderate pain or severe pain. (Patient not taking: Reported on 10/27/2021), Disp: 12 tablet, Rfl: 0   mirabegron ER (MYRBETRIQ) 50 MG TB24 tablet, Take 1 tablet (50 mg total) by mouth daily. (Patient not taking: Reported on 10/27/2021), Disp: 90 tablet, Rfl: 3   pregabalin (LYRICA) 25 MG capsule, Take 1 capsule (25 mg total) by mouth 2 (two) times daily. (Patient not taking: Reported on 10/27/2021), Disp: 60 capsule, Rfl: 0   tizanidine (ZANAFLEX) 2 MG capsule, Take one tab po qhs for spasm (Patient not taking: Reported on 10/27/2021), Disp: 90 capsule, Rfl: 1   traZODone (DESYREL) 50 MG tablet, Take 0.5-1 tablets (25-50 mg total) by mouth at bedtime as needed for sleep. (Patient not taking: Reported on 10/27/2021), Disp: 30 tablet, Rfl: 3  Physical exam:  Vitals:   10/27/21 0931  BP: (!) 135/54  Pulse:  65  Resp: 18  Temp: (!) 97 F (36.1 C)  SpO2: 98%  Weight: 218 lb 6.4 oz (99.1 kg)   Physical Exam Constitutional:      General: She is not in acute distress. Cardiovascular:     Rate and Rhythm: Normal rate and regular rhythm.     Heart sounds: Normal heart sounds.  Pulmonary:     Effort: Pulmonary effort is normal.     Breath sounds: Normal breath sounds.  Abdominal:     General: Bowel sounds are normal.     Palpations: Abdomen is soft.  Skin:    General: Skin is warm and dry.  Neurological:     Mental Status: She is alert and oriented to person, place, and time.         Latest Ref Rng & Units 10/27/2021    9:06 AM  CMP  Glucose 70 - 99 mg/dL 142   BUN 8 - 23 mg/dL 31   Creatinine 0.44 - 1.00 mg/dL 1.71   Sodium 135 - 145 mmol/L 142   Potassium 3.5 - 5.1 mmol/L 3.4   Chloride 98 - 111 mmol/L 108   CO2 22 - 32 mmol/L 27   Calcium 8.9 - 10.3 mg/dL 9.7   Total Protein 6.5 - 8.1 g/dL 6.6   Total Bilirubin 0.3 - 1.2 mg/dL 0.7   Alkaline Phos 38 - 126 U/L 58   AST 15 - 41 U/L 12   ALT 0 - 44 U/L 10       Latest Ref Rng & Units 10/27/2021    9:06 AM  CBC  WBC 4.0 - 10.5 K/uL 5.1   Hemoglobin 12.0 - 15.0 g/dL 13.3   Hematocrit 36.0 - 46.0 % 40.8   Platelets 150 - 400 K/uL 134      Assessment and plan- Patient is a 76 y.o. female with history of anemia of chronic kidney disease here for routine follow-up  Patient's hemoglobin typically ranges between 10-11.  It is presently 13.3 and it is unclear if this is a hemoconcentratedSample.  Regardless given that her counts have been stable between 10.5-11.5 over the last couple of years I will plan to monitor her labs every 6 months and see her back in 1 year.  TIBC is presently normal with an iron saturation of 21% and ferritin level of 53.  I am holding off on giving her any IV iron at this time   Visit Diagnosis 1. Iron deficiency anemia, unspecified iron deficiency anemia type      Dr. Randa Evens, MD, MPH [facility]  at [facility] [phone] 10/27/2021 12:49 PM

## 2021-10-30 DIAGNOSIS — E1151 Type 2 diabetes mellitus with diabetic peripheral angiopathy without gangrene: Secondary | ICD-10-CM | POA: Diagnosis not present

## 2021-10-30 DIAGNOSIS — I509 Heart failure, unspecified: Secondary | ICD-10-CM | POA: Diagnosis not present

## 2021-10-30 DIAGNOSIS — Z6839 Body mass index (BMI) 39.0-39.9, adult: Secondary | ICD-10-CM | POA: Diagnosis not present

## 2021-10-30 DIAGNOSIS — I110 Hypertensive heart disease with heart failure: Secondary | ICD-10-CM | POA: Diagnosis not present

## 2021-11-02 ENCOUNTER — Other Ambulatory Visit: Payer: HMO

## 2021-11-02 ENCOUNTER — Ambulatory Visit: Payer: HMO | Admitting: Oncology

## 2021-11-13 ENCOUNTER — Other Ambulatory Visit: Payer: Self-pay | Admitting: Nurse Practitioner

## 2021-11-24 ENCOUNTER — Telehealth: Payer: Self-pay

## 2021-11-24 NOTE — Telephone Encounter (Signed)
Sent in P.A. for patient's Victoza.

## 2021-11-24 NOTE — Telephone Encounter (Signed)
Patient's P.A. for Victoza was approved.

## 2021-11-27 ENCOUNTER — Encounter: Payer: Self-pay | Admitting: Nurse Practitioner

## 2021-12-04 ENCOUNTER — Encounter: Payer: Self-pay | Admitting: Nurse Practitioner

## 2021-12-04 ENCOUNTER — Ambulatory Visit (INDEPENDENT_AMBULATORY_CARE_PROVIDER_SITE_OTHER): Payer: HMO | Admitting: Nurse Practitioner

## 2021-12-04 VITALS — BP 138/60 | HR 62 | Temp 96.7°F | Resp 16 | Ht 63.0 in | Wt 220.4 lb

## 2021-12-04 DIAGNOSIS — I10 Essential (primary) hypertension: Secondary | ICD-10-CM | POA: Diagnosis not present

## 2021-12-04 DIAGNOSIS — E1159 Type 2 diabetes mellitus with other circulatory complications: Secondary | ICD-10-CM | POA: Diagnosis not present

## 2021-12-04 DIAGNOSIS — I5032 Chronic diastolic (congestive) heart failure: Secondary | ICD-10-CM | POA: Diagnosis not present

## 2021-12-04 DIAGNOSIS — L080 Pyoderma: Secondary | ICD-10-CM

## 2021-12-04 MED ORDER — VALACYCLOVIR HCL 500 MG PO TABS: 500.0000 mg | ORAL_TABLET | Freq: Every day | ORAL | 1 refills | Status: DC

## 2021-12-04 NOTE — Progress Notes (Unsigned)
[facility] [address], [place] [address]  Internal MEDICINE  Office Visit Note  Patient Name: Frances Greene  [id number]  [id number]  Date of Service: 12/04/2021  Chief Complaint  Patient presents with   Acute Visit    HPI Frances Greene presents for a follow-up visit for      Current Medication: Outpatient Encounter Medications as of 12/04/2021  Medication Sig   acetaminophen (TYLENOL) 325 MG tablet Take 650 mg by mouth every 6 (six) hours as needed.   AMBULATORY NON FORMULARY MEDICATION Medication Name: geni kot natural veggie laxitive   atorvastatin (LIPITOR) 10 MG tablet TAKE ONE TABLET BY MOUTH ONCE DAILY   betamethasone dipropionate 0.05 % cream Apply topically 2 (two) times daily.   bisoprolol (ZEBETA) 5 MG tablet TAKE ONE TABLET BY MOUTH ONCE DAILY   Blood Glucose Monitoring Suppl (ONETOUCH VERIO REFLECT) w/Device KIT Use as directed for once daily Dx E11.65   Cholecalciferol (VITAMIN D3) 1.25 MG (50000 UT) CAPS Take by mouth daily.   cinacalcet (SENSIPAR) 30 MG tablet    citalopram (CELEXA) 10 MG tablet Take 1 tablet (10 mg total) by mouth daily.   cyanocobalamin (VITAMIN B12) 100 MCG tablet Take 100 mcg by mouth daily.   febuxostat (ULORIC) 40 MG tablet TAKE ONE TABLET BY MOUTH ONCE DAILY   ferrous sulfate 325 (65 FE) MG tablet Take 325 mg by mouth daily with breakfast.   fluticasone-salmeterol (ADVAIR) 100-50 MCG/ACT AEPB Inhale 1 puff into the lungs 2 (two) times daily.   folic acid (FOLVITE) 1 MG tablet TAKE ONE TABLET BY MOUTH ONCE DAILY   furosemide (LASIX) 80 MG tablet TAKE ONE TABLET BY MOUTH EVERY MORNING   Garlic 6834 MG CAPS Take by mouth.   glucose blood (ONETOUCH VERIO) test strip Use as directed once a daily   hydrALAZINE (APRESOLINE) 25 MG tablet TAKE ONE TABLET BY MOUTH THREE TIMES DAILY   HYDROcodone-acetaminophen (NORCO/VICODIN) 5-325 MG tablet Take 1 tablet by mouth every 6 (six) hours as needed for moderate pain or severe  pain.   Insulin Pen Needle (PEN NEEDLES) 32G X 4 MM MISC 1 Units by Does not apply route daily. Use with victoza injections daily.   JARDIANCE 10 MG TABS tablet TAKE ONE TABLET BY MOUTH ONCE DAILY   ketoconazole (NIZORAL) 2 % shampoo SHAMPOO AS DIRECTED   latanoprost (XALATAN) 0.005 % ophthalmic solution latanoprost 0.005 % eye drops  INSTILL 1 DROP INTO EACH EYE IN THE EVENING   liraglutide (VICTOZA) 18 MG/3ML SOPN INJECT 1.8 MG ONCE DAILY SUBCUTANEOUSLY   meloxicam (MOBIC) 7.5 MG tablet TAKE 1 TABLET BY MOUTH ONCE DAILY FOR  GOUT  AND  ARTHRITIS   mirabegron ER (MYRBETRIQ) 50 MG TB24 tablet Take 1 tablet (50 mg total) by mouth daily.   montelukast (SINGULAIR) 10 MG tablet Take 1 tablet (10 mg total) by mouth daily.   NON FORMULARY CPAP pt uses [facility] for CPAP   Omega-3 Fatty Acids (FISH OIL PO) Take by mouth.   OneTouch Delica Lancets 19Q MISC Use as directed once a daily DxE11.65   pregabalin (LYRICA) 25 MG capsule Take 1 capsule (25 mg total) by mouth 2 (two) times daily.   senna (SENOKOT) 8.6 MG TABS tablet Take 1 tablet by mouth.   Sulfacetamide Sodium (SODIUM SULFACETAMIDE) 10 % SHAM Apply topically.   tizanidine (ZANAFLEX) 2 MG capsule Take one tab po qhs for spasm   traZODone (DESYREL) 50 MG tablet Take 0.5-1 tablets (25-50 mg total)  by mouth at bedtime as needed for sleep.   Vitamin D, Ergocalciferol, (DRISDOL) 1.25 MG (50000 UNIT) CAPS capsule TAKE ONE CAPSULE BY MOUTH ONCE WEEKLY ON MONDAY   No facility-administered encounter medications on file as of 12/04/2021.    Surgical History: Past Surgical History:  Procedure Laterality Date   ABDOMINAL HYSTERECTOMY     BREAST BIOPSY Left 1989   neg   CATARACT EXTRACTION W/PHACO Right 05/21/2015   Procedure: CATARACT EXTRACTION PHACO AND INTRAOCULAR LENS PLACEMENT ([facility]);  Surgeon: Milus Height, MD;  Location: [facility];  Service: Ophthalmology;  Laterality: Right;  [place]: AP%: 10.0 CDE: 9.53   CATARACT EXTRACTION  W/PHACO Left 12/31/2020   Procedure: CATARACT EXTRACTION PHACO AND INTRAOCULAR LENS PLACEMENT ([facility]) LEFT DIABETIC 4.13 01:00.9;  Surgeon: Leandrew Koyanagi, MD;  Location: [place];  Service: Ophthalmology;  Laterality: Left;   COLONOSCOPY     JOINT REPLACEMENT Bilateral    knee   PARS PLANA VITRECTOMY Right 05/21/2015   Procedure: PARS PLANA VITRECTOMY WITH 25 GAUGE;  Surgeon: Milus Height, MD;  Location: [facility];  Service: Ophthalmology;  Laterality: Right;  Lot # [id number] H Endo laser: Watts: 300 Pulse Duration: 200 Total Pulses:143    shoulder surgery      Medical History: Past Medical History:  Diagnosis Date   Arthritis    Asthma    Bronchitis    CHF (congestive heart failure) (HCC)    Chronic kidney disease    decreased kidney function   Diabetes mellitus without complication (HCC)    Gout    Heart murmur    Hypertension    Multiple allergies    Shortness of breath dyspnea    w/ exertion   Sleep apnea    CPAP   Swelling of both lower extremities    Wheezing     Family History: Family History  Problem Relation Age of Onset   Breast cancer Cousin    Breast cancer Other    Hypertension Mother    Diabetes Mother    Hypertension Father    Diabetes Father    Alcohol abuse Father    Aneurysm Sister    Alcohol abuse Sister    Stroke Sister        died from stroke   Hepatitis Brother    Hypertension Sister        dialysis   Diabetes Sister    Alcoholism Brother    Alcoholism Brother        dies from head injury from a fall.  not an alcoholic,   Cancer Brother    Diabetes Brother    Hypertension Brother    Diabetes Brother    Cancer Brother    Hypertension Brother    Stroke Brother    Alcoholism Brother     Social History   Socioeconomic History   Marital status: Divorced    Spouse name: Not on file   Number of children: Not on file   Years of education: Not on file   Highest education level: Not on file  Occupational History   Not  on file  Tobacco Use   Smoking status: Former    Types: Cigarettes   Smokeless tobacco: Never   Tobacco comments:    4 cigarettes quit 20 years ago  Vaping Use   Vaping Use: Never used  Substance and Sexual Activity   Alcohol use: No   Drug use: Never   Sexual activity: Not on file  Other Topics Concern   Not on file  Social History Narrative   Not on file   Social Determinants of Health   Financial Resource Strain: Low Risk  (12/04/2020)   Overall Financial Resource Strain (CARDIA)    Difficulty of Paying Living Expenses: Not very hard  Food Insecurity: Not on file  Transportation Needs: Not on file  Physical Activity: Not on file  Stress: Not on file  Social Connections: Not on file  Intimate Partner Violence: Not on file      Review of Systems  Vital Signs: BP (!) 148/55   Pulse 62   Temp (!) 96.7 F (35.9 C)   Resp 16   Ht _0  (1.6 m)   Wt 220 lb 6.4 oz (100 kg)   SpO2 93%   BMI 39.04 kg/m    Physical Exam     Assessment/Plan:   General Counseling: Merari verbalizes understanding of the findings of todays visit and agrees with plan of treatment. I have discussed any further diagnostic evaluation that may be needed or ordered today. We also reviewed her medications today. she has been encouraged to call the office with any questions or concerns that should arise related to todays visit.    No orders of the defined types were placed in this encounter.   No orders of the defined types were placed in this encounter.   No follow-ups on file.   Total time spent:*** Minutes Time spent includes review of chart, medications, test results, and follow up plan with the patient.   [place] Controlled Substance Database was reviewed by me.  This patient was seen by Jonetta Osgood, FNP-C in collaboration with Dr. Clayborn Bigness as a part of collaborative care agreement.   Harshitha Fretz R. Valetta Fuller, MSN, FNP-C Internal medicine

## 2021-12-05 ENCOUNTER — Encounter: Payer: Self-pay | Admitting: Nurse Practitioner

## 2021-12-07 ENCOUNTER — Telehealth: Payer: Self-pay

## 2021-12-07 NOTE — Telephone Encounter (Signed)
Faxed [facility] for diabetic shoe

## 2021-12-09 ENCOUNTER — Ambulatory Visit: Payer: HMO | Admitting: Urology

## 2021-12-18 ENCOUNTER — Telehealth: Payer: Self-pay | Admitting: Nurse Practitioner

## 2021-12-18 NOTE — Telephone Encounter (Signed)
Left vm to confirm 12/23/21 appointment-Toni

## 2021-12-23 ENCOUNTER — Ambulatory Visit (INDEPENDENT_AMBULATORY_CARE_PROVIDER_SITE_OTHER): Payer: HMO

## 2021-12-23 DIAGNOSIS — G4733 Obstructive sleep apnea (adult) (pediatric): Secondary | ICD-10-CM

## 2021-12-23 NOTE — Progress Notes (Signed)
95 percentile pressure 8.3   95th percentile leak 3.4     apnea-hypopnea index  0.7 /hr   total days used  >4 hr 88 days  total days used <4 hr 2 days  Total compliance 97.8 percent  She is doing great. No problems or questions at this time  Pt was seen by Claiborne Billings  RRT/RCP  from [facility]

## 2022-01-05 ENCOUNTER — Telehealth: Payer: Self-pay | Admitting: Nurse Practitioner

## 2022-01-05 ENCOUNTER — Encounter: Payer: Self-pay | Admitting: Nurse Practitioner

## 2022-01-05 ENCOUNTER — Ambulatory Visit (INDEPENDENT_AMBULATORY_CARE_PROVIDER_SITE_OTHER): Payer: HMO | Admitting: Nurse Practitioner

## 2022-01-05 VITALS — BP 140/65 | HR 62 | Temp 96.5°F | Resp 16 | Ht 63.0 in | Wt 217.4 lb

## 2022-01-05 DIAGNOSIS — M65341 Trigger finger, right ring finger: Secondary | ICD-10-CM

## 2022-01-05 DIAGNOSIS — R202 Paresthesia of skin: Secondary | ICD-10-CM

## 2022-01-05 DIAGNOSIS — E1159 Type 2 diabetes mellitus with other circulatory complications: Secondary | ICD-10-CM | POA: Diagnosis not present

## 2022-01-05 DIAGNOSIS — R200 Anesthesia of skin: Secondary | ICD-10-CM | POA: Diagnosis not present

## 2022-01-05 DIAGNOSIS — I10 Essential (primary) hypertension: Secondary | ICD-10-CM | POA: Diagnosis not present

## 2022-01-05 LAB — POCT GLYCOSYLATED HEMOGLOBIN (HGB A1C): Hemoglobin A1C: 6.2 % — AB (ref 4.0–5.6)

## 2022-01-05 NOTE — Progress Notes (Signed)
[facility] [place], [place] [address]  Internal MEDICINE  Office Visit Note  Patient Name: Frances Greene  [id number]  [id number]  Date of Service: 01/05/2022  Chief Complaint  Patient presents with   Follow-up   Diabetes   Hypertension    HPI Evelena presents for a follow up visit for diabetes, hypertension and right hand numbness . Diabetes -- A1c improving to 6.2 Hypertension -- elevated but improved when rechecked.  Right hand numbness and tingling and trigger finger x3 months    Outpatient Encounter Medications as of 01/05/2022  Medication Sig   acetaminophen (TYLENOL) 325 MG tablet Take 650 mg by mouth every 6 (six) hours as needed.   AMBULATORY NON FORMULARY MEDICATION Medication Name: geni kot natural veggie laxitive   atorvastatin (LIPITOR) 10 MG tablet TAKE ONE TABLET BY MOUTH ONCE DAILY   betamethasone dipropionate 0.05 % cream Apply topically 2 (two) times daily.   bisoprolol (ZEBETA) 5 MG tablet TAKE ONE TABLET BY MOUTH ONCE DAILY   Blood Glucose Monitoring Suppl (ONETOUCH VERIO REFLECT) w/Device KIT Use as directed for once daily Dx E11.65   Cholecalciferol (VITAMIN D3) 1.25 MG (50000 UT) CAPS Take by mouth daily.   cinacalcet (SENSIPAR) 30 MG tablet    citalopram (CELEXA) 10 MG tablet Take 1 tablet (10 mg total) by mouth daily.   cyanocobalamin (VITAMIN B12) 100 MCG tablet Take 100 mcg by mouth daily.   febuxostat (ULORIC) 40 MG tablet TAKE ONE TABLET BY MOUTH ONCE DAILY   ferrous sulfate 325 (65 FE) MG tablet Take 325 mg by mouth daily with breakfast.   fluticasone-salmeterol (ADVAIR) 100-50 MCG/ACT AEPB Inhale 1 puff into the lungs 2 (two) times daily.   folic acid (FOLVITE) 1 MG tablet TAKE ONE TABLET BY MOUTH ONCE DAILY   furosemide (LASIX) 80 MG tablet TAKE ONE TABLET BY MOUTH EVERY MORNING   Garlic 8315 MG CAPS Take by mouth.   glucose blood (ONETOUCH VERIO) test strip Use as directed once a daily   hydrALAZINE  (APRESOLINE) 25 MG tablet TAKE ONE TABLET BY MOUTH THREE TIMES DAILY   HYDROcodone-acetaminophen (NORCO/VICODIN) 5-325 MG tablet Take 1 tablet by mouth every 6 (six) hours as needed for moderate pain or severe pain.   Insulin Pen Needle (PEN NEEDLES) 32G X 4 MM MISC 1 Units by Does not apply route daily. Use with victoza injections daily.   JARDIANCE 10 MG TABS tablet TAKE ONE TABLET BY MOUTH ONCE DAILY   ketoconazole (NIZORAL) 2 % shampoo SHAMPOO AS DIRECTED   latanoprost (XALATAN) 0.005 % ophthalmic solution latanoprost 0.005 % eye drops  INSTILL 1 DROP INTO EACH EYE IN THE EVENING   liraglutide (VICTOZA) 18 MG/3ML SOPN INJECT 1.8 MG ONCE DAILY SUBCUTANEOUSLY   meloxicam (MOBIC) 7.5 MG tablet TAKE 1 TABLET BY MOUTH ONCE DAILY FOR  GOUT  AND  ARTHRITIS   mirabegron ER (MYRBETRIQ) 50 MG TB24 tablet Take 1 tablet (50 mg total) by mouth daily.   montelukast (SINGULAIR) 10 MG tablet Take 1 tablet (10 mg total) by mouth daily.   NON FORMULARY CPAP pt uses [facility] for CPAP   Omega-3 Fatty Acids (FISH OIL PO) Take by mouth.   OneTouch Delica Lancets 17O MISC Use as directed once a daily DxE11.65   pregabalin (LYRICA) 25 MG capsule Take 1 capsule (25 mg total) by mouth 2 (two) times daily.   senna (SENOKOT) 8.6 MG TABS tablet Take 1 tablet by mouth.   Sulfacetamide Sodium (SODIUM  SULFACETAMIDE) 10 % SHAM Apply topically.   tizanidine (ZANAFLEX) 2 MG capsule Take one tab po qhs for spasm   traZODone (DESYREL) 50 MG tablet Take 0.5-1 tablets (25-50 mg total) by mouth at bedtime as needed for sleep.   valACYclovir (VALTREX) 500 MG tablet Take 1 tablet (500 mg total) by mouth daily.   Vitamin D, Ergocalciferol, (DRISDOL) 1.25 MG (50000 UNIT) CAPS capsule TAKE ONE CAPSULE BY MOUTH ONCE WEEKLY ON MONDAY   No facility-administered encounter medications on file as of 01/05/2022.    Surgical History: Past Surgical History:  Procedure Laterality Date   ABDOMINAL HYSTERECTOMY     BREAST  BIOPSY Left 1989   neg   CATARACT EXTRACTION W/PHACO Right 05/21/2015   Procedure: CATARACT EXTRACTION PHACO AND INTRAOCULAR LENS PLACEMENT (IOC);  Surgeon: Milus Height, MD;  Location: [facility];  Service: Ophthalmology;  Laterality: Right;  [place]: AP%: 10.0 CDE: 9.53   CATARACT EXTRACTION W/PHACO Left 12/31/2020   Procedure: CATARACT EXTRACTION PHACO AND INTRAOCULAR LENS PLACEMENT (IOC) LEFT DIABETIC 4.13 01:00.9;  Surgeon: Leandrew Koyanagi, MD;  Location: [facility];  Service: Ophthalmology;  Laterality: Left;   COLONOSCOPY     JOINT REPLACEMENT Bilateral    knee   PARS PLANA VITRECTOMY Right 05/21/2015   Procedure: PARS PLANA VITRECTOMY WITH 25 GAUGE;  Surgeon: Milus Height, MD;  Location: [facility];  Service: Ophthalmology;  Laterality: Right;  Lot # [id number] Endo laser: Watts: 300 Pulse Duration: 200 Total Pulses:143    shoulder surgery      Medical History: Past Medical History:  Diagnosis Date   Arthritis    Asthma    Bronchitis    CHF (congestive heart failure) (HCC)    Chronic kidney disease    decreased kidney function   Diabetes mellitus without complication (HCC)    Gout    Heart murmur    Hypertension    Multiple allergies    Shortness of breath dyspnea    w/ exertion   Sleep apnea    CPAP   Swelling of both lower extremities    Wheezing     Family History: Family History  Problem Relation Age of Onset   Breast cancer Cousin    Breast cancer Other    Hypertension Mother    Diabetes Mother    Hypertension Father    Diabetes Father    Alcohol abuse Father    Aneurysm Sister    Alcohol abuse Sister    Stroke Sister        died from stroke   Hepatitis Brother    Hypertension Sister        dialysis   Diabetes Sister    Alcoholism Brother    Alcoholism Brother        dies from head injury from a fall.  not an alcoholic,   Cancer Brother    Diabetes Brother    Hypertension Brother    Diabetes Brother    Cancer Brother     Hypertension Brother    Stroke Brother    Alcoholism Brother     Social History   Socioeconomic History   Marital status: Divorced    Spouse name: Not on file   Number of children: Not on file   Years of education: Not on file   Highest education level: Not on file  Occupational History   Not on file  Tobacco Use   Smoking status: Former    Types: Cigarettes   Smokeless tobacco: Never   Tobacco comments:  4 cigarettes quit 20 years ago  Vaping Use   Vaping Use: Never used  Substance and Sexual Activity   Alcohol use: No   Drug use: Never   Sexual activity: Not on file  Other Topics Concern   Not on file  Social History Narrative   Not on file   Social Determinants of Health   Financial Resource Strain: Low Risk  (12/04/2020)   Overall Financial Resource Strain (CARDIA)    Difficulty of Paying Living Expenses: Not very hard  Food Insecurity: Not on file  Transportation Needs: Not on file  Physical Activity: Not on file  Stress: Not on file  Social Connections: Not on file  Intimate Partner Violence: Not on file      Review of Systems  Constitutional:  Negative for chills, fatigue and unexpected weight change.  HENT:  Negative for congestion, rhinorrhea, sneezing and sore throat.   Eyes:  Negative for redness.  Respiratory:  Negative for cough, chest tightness and shortness of breath.   Cardiovascular:  Negative for chest pain and palpitations.  Gastrointestinal:  Negative for abdominal pain, constipation, diarrhea, nausea and vomiting.  Genitourinary:  Negative for dysuria and frequency.  Musculoskeletal:  Positive for arthralgias (right hand, right ring finger). Negative for back pain, joint swelling and neck pain.  Skin:  Negative for rash.  Neurological:  Positive for numbness (right hand). Negative for tremors.  Hematological:  Negative for adenopathy. Does not bruise/bleed easily.  Psychiatric/Behavioral:  Negative for behavioral problems (Depression),  sleep disturbance and suicidal ideas. The patient is not nervous/anxious.     Vital Signs: BP (!) 140/65 Comment: 167/65  Pulse 62   Temp (!) 96.5 F (35.8 C)   Resp 16   Ht _0  (1.6 m)   Wt 217 lb 6.4 oz (98.6 kg)   SpO2 97%   BMI 38.51 kg/m    Physical Exam Vitals reviewed.  Constitutional:      General: She is not in acute distress.    Appearance: Normal appearance. She is obese. She is not ill-appearing.  HENT:     Head: Normocephalic and atraumatic.  Eyes:     Pupils: Pupils are equal, round, and reactive to light.  Cardiovascular:     Rate and Rhythm: Normal rate and regular rhythm.  Pulmonary:     Effort: Pulmonary effort is normal. No respiratory distress.  Neurological:     Mental Status: She is alert and oriented to person, place, and time.     Cranial Nerves: No cranial nerve deficit.     Coordination: Coordination normal.     Gait: Gait normal.  Psychiatric:        Mood and Affect: Mood normal.        Behavior: Behavior normal.        Assessment/Plan: 1. Type 2 diabetes mellitus with vascular disease (HCC) A1c improving, continue current medications and diet modifications. Repeat A1c in 3 months  - POCT glycosylated hemoglobin (Hb A1C)  2. Essential hypertension, benign Stable with current medications, continue as prescribed  3. Trigger ring finger of right hand Referred to ortho for further evaluation - Ambulatory referral to Orthopedic Surgery  4. Numbness and tingling in right hand X3 months, refer to ortho for further evaluation.  - Ambulatory referral to Orthopedic Surgery   General Counseling: Carloyn Manner understanding of the findings of todays visit and agrees with plan of treatment. I have discussed any further diagnostic evaluation that may be needed or ordered today. We also  reviewed her medications today. she has been encouraged to call the office with any questions or concerns that should arise related to todays  visit.    Orders Placed This Encounter  Procedures   Ambulatory referral to Orthopedic Surgery   POCT glycosylated hemoglobin (Hb A1C)    No orders of the defined types were placed in this encounter.   Return in about 3 months (around 04/14/2022) for F/U, Recheck A1C, Deanndra Kirley PCP.   Total time spent:30 Minutes Time spent includes review of chart, medications, test results, and follow up plan with the patient.   [place] Controlled Substance Database was reviewed by me.  This patient was seen by Jonetta Osgood, FNP-C in collaboration with Dr. Clayborn Bigness as a part of collaborative care agreement.   Ayslin Kundert R. Valetta Fuller, MSN, FNP-C Internal medicine

## 2022-01-05 NOTE — Telephone Encounter (Signed)
Received form from [employer] regarding insulin change. Gave to Alyssa to complete-Toni

## 2022-01-05 NOTE — Telephone Encounter (Signed)
Orthopedic referral sent via [employer] to Dr. Peggye Ley with [facility]

## 2022-01-15 ENCOUNTER — Telehealth: Payer: Self-pay | Admitting: Nurse Practitioner

## 2022-01-15 NOTE — Telephone Encounter (Signed)
Per Rudene Christians , referral has been closed due to patient not resolving outstanding balance-Toni

## 2022-01-20 DIAGNOSIS — G4733 Obstructive sleep apnea (adult) (pediatric): Secondary | ICD-10-CM | POA: Diagnosis not present

## 2022-02-16 ENCOUNTER — Other Ambulatory Visit: Payer: Self-pay | Admitting: Nurse Practitioner

## 2022-02-16 DIAGNOSIS — J309 Allergic rhinitis, unspecified: Secondary | ICD-10-CM

## 2022-02-19 ENCOUNTER — Telehealth: Payer: Self-pay

## 2022-02-19 NOTE — Telephone Encounter (Signed)
Left message for patient to give office a call.  

## 2022-02-20 DIAGNOSIS — G4733 Obstructive sleep apnea (adult) (pediatric): Secondary | ICD-10-CM | POA: Diagnosis not present

## 2022-03-11 DIAGNOSIS — I10 Essential (primary) hypertension: Secondary | ICD-10-CM | POA: Diagnosis not present

## 2022-03-11 DIAGNOSIS — R809 Proteinuria, unspecified: Secondary | ICD-10-CM | POA: Diagnosis not present

## 2022-03-11 DIAGNOSIS — E1122 Type 2 diabetes mellitus with diabetic chronic kidney disease: Secondary | ICD-10-CM | POA: Diagnosis not present

## 2022-03-11 DIAGNOSIS — N2581 Secondary hyperparathyroidism of renal origin: Secondary | ICD-10-CM | POA: Diagnosis not present

## 2022-03-11 DIAGNOSIS — N184 Chronic kidney disease, stage 4 (severe): Secondary | ICD-10-CM | POA: Diagnosis not present

## 2022-03-17 ENCOUNTER — Other Ambulatory Visit: Payer: Self-pay

## 2022-03-17 MED ORDER — VICTOZA 18 MG/3ML ~~LOC~~ SOPN: PEN_INJECTOR | SUBCUTANEOUS | 3 refills | Status: DC

## 2022-03-21 DIAGNOSIS — G4733 Obstructive sleep apnea (adult) (pediatric): Secondary | ICD-10-CM | POA: Diagnosis not present

## 2022-03-22 ENCOUNTER — Other Ambulatory Visit: Payer: Self-pay | Admitting: Nurse Practitioner

## 2022-03-30 ENCOUNTER — Telehealth: Payer: Self-pay

## 2022-03-30 NOTE — Telephone Encounter (Signed)
Completed P.A. for patient's Victoza.

## 2022-03-31 ENCOUNTER — Telehealth: Payer: Self-pay

## 2022-03-31 NOTE — Telephone Encounter (Signed)
Left message for patient to give office a call to let her know that her Victoza was approved.

## 2022-04-08 ENCOUNTER — Ambulatory Visit (INDEPENDENT_AMBULATORY_CARE_PROVIDER_SITE_OTHER): Payer: HMO | Admitting: Nurse Practitioner

## 2022-04-08 ENCOUNTER — Encounter: Payer: Self-pay | Admitting: Nurse Practitioner

## 2022-04-08 VITALS — BP 148/60 | HR 72 | Temp 97.1°F | Resp 16 | Ht 63.0 in | Wt 213.2 lb

## 2022-04-08 DIAGNOSIS — I5032 Chronic diastolic (congestive) heart failure: Secondary | ICD-10-CM | POA: Diagnosis not present

## 2022-04-08 DIAGNOSIS — N1832 Chronic kidney disease, stage 3b: Secondary | ICD-10-CM

## 2022-04-08 DIAGNOSIS — E1159 Type 2 diabetes mellitus with other circulatory complications: Secondary | ICD-10-CM

## 2022-04-08 LAB — POCT GLYCOSYLATED HEMOGLOBIN (HGB A1C): Hemoglobin A1C: 6.4 % — AB (ref 4.0–5.6)

## 2022-04-08 MED ORDER — ALCOHOL SWABS PADS: MEDICATED_PAD | 3 refills | Status: DC

## 2022-04-08 MED ORDER — VICTOZA 18 MG/3ML ~~LOC~~ SOPN: PEN_INJECTOR | SUBCUTANEOUS | 3 refills | Status: DC

## 2022-04-08 MED ORDER — ONETOUCH VERIO VI STRP: ORAL_STRIP | 3 refills | Status: DC

## 2022-04-08 NOTE — Progress Notes (Signed)
[facility] [place], [place] [address]  Internal MEDICINE  Office Visit Note  Patient Name: Frances Greene  [id number]  [id number]  Date of Service: 04/08/2022  Chief Complaint  Patient presents with   Hypertension   Diabetes    HPI Aalina presents for a follow-up visit for diabetes, CKD, heart failure Diabetes -- stable, slight increase in A1c to 6.4. doing well on current medications CKD -- is on jardiance, followed by nephrology.  Heart failure and hypertension -- not currently seeing a cardiologist. Ejection fraction is preserved as per latest imaging.Takes jardiance, furosemide, atorvastatin, hydralazine, and bisoprolol. Bp is controlled with current medications.     Current Medication: Outpatient Encounter Medications as of 04/08/2022  Medication Sig   acetaminophen (TYLENOL) 325 MG tablet Take 650 mg by mouth every 6 (six) hours as needed.   Alcohol Swabs PADS Use 1 alcohol swab with glucose meter and with injectable medication daily.   AMBULATORY NON FORMULARY MEDICATION Medication Name: geni kot natural veggie laxitive   atorvastatin (LIPITOR) 10 MG tablet TAKE ONE TABLET BY MOUTH ONCE DAILY   betamethasone dipropionate 0.05 % cream Apply topically 2 (two) times daily.   bisoprolol (ZEBETA) 5 MG tablet TAKE ONE TABLET BY MOUTH ONCE DAILY   Blood Glucose Monitoring Suppl (ONETOUCH VERIO REFLECT) w/Device KIT Use as directed for once daily Dx E11.65   Cholecalciferol (VITAMIN D3) 1.25 MG (50000 UT) CAPS Take by mouth daily.   cinacalcet (SENSIPAR) 30 MG tablet    citalopram (CELEXA) 10 MG tablet TAKE ONE TABLET BY MOUTH ONCE DAILY   cyanocobalamin (VITAMIN B12) 100 MCG tablet Take 100 mcg by mouth daily.   febuxostat (ULORIC) 40 MG tablet TAKE ONE TABLET BY MOUTH ONCE DAILY   ferrous sulfate 325 (65 FE) MG tablet Take 325 mg by mouth daily with breakfast.   fluticasone-salmeterol (ADVAIR) 100-50 MCG/ACT AEPB Inhale 1 puff into the lungs 2  (two) times daily.   folic acid (FOLVITE) 1 MG tablet TAKE ONE TABLET BY MOUTH ONCE DAILY   furosemide (LASIX) 80 MG tablet TAKE ONE TABLET BY MOUTH EVERY MORNING   Garlic 123XX123 MG CAPS Take by mouth.   hydrALAZINE (APRESOLINE) 25 MG tablet TAKE ONE TABLET BY MOUTH THREE TIMES DAILY   HYDROcodone-acetaminophen (NORCO/VICODIN) 5-325 MG tablet Take 1 tablet by mouth every 6 (six) hours as needed for moderate pain or severe pain.   Insulin Pen Needle (PEN NEEDLES) 32G X 4 MM MISC 1 Units by Does not apply route daily. Use with victoza injections daily.   JARDIANCE 10 MG TABS tablet TAKE ONE TABLET BY MOUTH ONCE DAILY   ketoconazole (NIZORAL) 2 % shampoo SHAMPOO AS DIRECTED   latanoprost (XALATAN) 0.005 % ophthalmic solution latanoprost 0.005 % eye drops  INSTILL 1 DROP INTO EACH EYE IN THE EVENING   meloxicam (MOBIC) 7.5 MG tablet TAKE 1 TABLET BY MOUTH ONCE DAILY FOR  GOUT  AND  ARTHRITIS   mirabegron ER (MYRBETRIQ) 50 MG TB24 tablet Take 1 tablet (50 mg total) by mouth daily.   montelukast (SINGULAIR) 10 MG tablet TAKE ONE TABLET BY MOUTH EVERY EVENING   NON FORMULARY CPAP pt uses [facility] for CPAP   Omega-3 Fatty Acids (FISH OIL PO) Take by mouth.   OneTouch Delica Lancets 99991111 MISC Use as directed once a daily DxE11.65   senna (SENOKOT) 8.6 MG TABS tablet Take 1 tablet by mouth.   Sulfacetamide Sodium (SODIUM SULFACETAMIDE) 10 % SHAM Apply topically.  tizanidine (ZANAFLEX) 2 MG capsule Take one tab po qhs for spasm   traZODone (DESYREL) 50 MG tablet Take 0.5-1 tablets (25-50 mg total) by mouth at bedtime as needed for sleep.   valACYclovir (VALTREX) 500 MG tablet Take 1 tablet (500 mg total) by mouth daily.   Vitamin D, Ergocalciferol, (DRISDOL) 1.25 MG (50000 UNIT) CAPS capsule TAKE ONE CAPSULE BY MOUTH ONCE WEEKLY ON MONDAY   [DISCONTINUED] glucose blood (ONETOUCH VERIO) test strip Use as directed once a daily   [DISCONTINUED] liraglutide (VICTOZA) 18 MG/3ML SOPN INJECT 1.8 MG  ONCE DAILY SUBCUTANEOUSLY   [DISCONTINUED] pregabalin (LYRICA) 25 MG capsule Take 1 capsule (25 mg total) by mouth 2 (two) times daily.   glucose blood (ONETOUCH VERIO) test strip Use as directed once a daily   liraglutide (VICTOZA) 18 MG/3ML SOPN INJECT 1.8 MG ONCE DAILY SUBCUTANEOUSLY   No facility-administered encounter medications on file as of 04/08/2022.    Surgical History: Past Surgical History:  Procedure Laterality Date   ABDOMINAL HYSTERECTOMY     BREAST BIOPSY Left 1989   neg   CATARACT EXTRACTION W/PHACO Right 05/21/2015   Procedure: CATARACT EXTRACTION PHACO AND INTRAOCULAR LENS PLACEMENT (IOC);  Surgeon: Milus Height, MD;  Location: [facility];  Service: Ophthalmology;  Laterality: Right;  [place]: AP%: 10.0 CDE: 9.53   CATARACT EXTRACTION W/PHACO Left 12/31/2020   Procedure: CATARACT EXTRACTION PHACO AND INTRAOCULAR LENS PLACEMENT (IOC) LEFT DIABETIC 4.13 01:00.9;  Surgeon: Leandrew Koyanagi, MD;  Location: [facility];  Service: Ophthalmology;  Laterality: Left;   COLONOSCOPY     JOINT REPLACEMENT Bilateral    knee   PARS PLANA VITRECTOMY Right 05/21/2015   Procedure: PARS PLANA VITRECTOMY WITH 25 GAUGE;  Surgeon: Milus Height, MD;  Location: [facility];  Service: Ophthalmology;  Laterality: Right;  Lot # [id number] Endo laser: Watts: 300 Pulse Duration: 200 Total Pulses:143    shoulder surgery      Medical History: Past Medical History:  Diagnosis Date   Arthritis    Asthma    Bronchitis    CHF (congestive heart failure) (HCC)    Chronic kidney disease    decreased kidney function   Diabetes mellitus without complication (HCC)    Gout    Heart murmur    Hypertension    Multiple allergies    Shortness of breath dyspnea    w/ exertion   Sleep apnea    CPAP   Swelling of both lower extremities    Wheezing     Family History: Family History  Problem Relation Age of Onset   Breast cancer Cousin    Breast cancer Other    Hypertension Mother     Diabetes Mother    Hypertension Father    Diabetes Father    Alcohol abuse Father    Aneurysm Sister    Alcohol abuse Sister    Stroke Sister        died from stroke   Hepatitis Brother    Hypertension Sister        dialysis   Diabetes Sister    Alcoholism Brother    Alcoholism Brother        dies from head injury from a fall.  not an alcoholic,   Cancer Brother    Diabetes Brother    Hypertension Brother    Diabetes Brother    Cancer Brother    Hypertension Brother    Stroke Brother    Alcoholism Brother     Social History   Socioeconomic  History   Marital status: Divorced    Spouse name: Not on file   Number of children: Not on file   Years of education: Not on file   Highest education level: Not on file  Occupational History   Not on file  Tobacco Use   Smoking status: Former    Types: Cigarettes   Smokeless tobacco: Never   Tobacco comments:    4 cigarettes quit 20 years ago  Vaping Use   Vaping Use: Never used  Substance and Sexual Activity   Alcohol use: No   Drug use: Never   Sexual activity: Not on file  Other Topics Concern   Not on file  Social History Narrative   Not on file   Social Determinants of Health   Financial Resource Strain: Low Risk  (12/04/2020)   Overall Financial Resource Strain (CARDIA)    Difficulty of Paying Living Expenses: Not very hard  Food Insecurity: Not on file  Transportation Needs: Not on file  Physical Activity: Not on file  Stress: Not on file  Social Connections: Not on file  Intimate Partner Violence: Not on file      Review of Systems  Constitutional:  Positive for activity change and fatigue. Negative for chills and unexpected weight change.  HENT:  Negative for congestion, rhinorrhea, sneezing and sore throat.   Eyes:  Negative for redness.  Respiratory:  Positive for shortness of breath (intermittent). Negative for cough and chest tightness.   Cardiovascular:  Positive for leg swelling (intermittent).  Negative for chest pain and palpitations.  Gastrointestinal:  Negative for abdominal pain, constipation, diarrhea, nausea and vomiting.  Genitourinary:  Negative for dysuria and frequency.  Musculoskeletal:  Positive for arthralgias (right hand, right ring finger). Negative for back pain, joint swelling and neck pain.  Skin:  Negative for rash.  Neurological:  Positive for numbness (right hand). Negative for tremors.  Hematological:  Negative for adenopathy. Does not bruise/bleed easily.  Psychiatric/Behavioral:  Negative for behavioral problems (Depression), sleep disturbance and suicidal ideas. The patient is not nervous/anxious.     Vital Signs: BP (!) 148/60   Pulse 72   Temp (!) 97.1 F (36.2 C)   Resp 16   Ht 5\' 3"  (1.6 m)   Wt 213 lb 3.2 oz (96.7 kg)   SpO2 93%   BMI 37.77 kg/m    Physical Exam Vitals reviewed.  Constitutional:      General: She is not in acute distress.    Appearance: Normal appearance. She is obese. She is not ill-appearing.  HENT:     Head: Normocephalic and atraumatic.  Eyes:     Pupils: Pupils are equal, round, and reactive to light.  Cardiovascular:     Rate and Rhythm: Normal rate and regular rhythm.  Pulmonary:     Effort: Pulmonary effort is normal. No respiratory distress.  Neurological:     Mental Status: She is alert and oriented to person, place, and time.     Cranial Nerves: No cranial nerve deficit.     Coordination: Coordination normal.     Gait: Gait normal.  Psychiatric:        Mood and Affect: Mood normal.        Behavior: Behavior normal.        Assessment/Plan: 1. Type 2 diabetes mellitus with vascular disease (HCC) Stable, continue victoza as prescribed.  - POCT glycosylated hemoglobin (Hb A1C) - liraglutide (VICTOZA) 18 MG/3ML SOPN; INJECT 1.8 MG ONCE DAILY SUBCUTANEOUSLY  Dispense: 270  mL; Refill: 3 - Alcohol Swabs PADS; Use 1 alcohol swab with glucose meter and with injectable medication daily.  Dispense: 300  each; Refill: 3 - glucose blood (ONETOUCH VERIO) test strip; Use as directed once a daily  Dispense: 300 strip; Refill: 3  2. Stage 3b chronic kidney disease ([place]) Continue jardiance as prescribed and follow up with nephrology as scheduled   3. Chronic heart failure with preserved ejection fraction (HCC) Continue jardiance and other medications as prescribed.    General Counseling: johnnisha schnabel understanding of the findings of todays visit and agrees with plan of treatment. I have discussed any further diagnostic evaluation that may be needed or ordered today. We also reviewed her medications today. she has been encouraged to call the office with any questions or concerns that should arise related to todays visit.    Orders Placed This Encounter  Procedures   POCT glycosylated hemoglobin (Hb A1C)    Meds ordered this encounter  Medications   liraglutide (VICTOZA) 18 MG/3ML SOPN    Sig: INJECT 1.8 MG ONCE DAILY SUBCUTANEOUSLY    Dispense:  270 mL    Refill:  3    Fill as 90 day supply, please fill as 3-pak   Alcohol Swabs PADS    Sig: Use 1 alcohol swab with glucose meter and with injectable medication daily.    Dispense:  300 each    Refill:  3    Fill as 3 month supply   glucose blood (ONETOUCH VERIO) test strip    Sig: Use as directed once a daily    Dispense:  300 strip    Refill:  3    Return for previously scheduled, CPE, Adrean Findlay PCP in august. .   Total time spent:30 Minutes Time spent includes review of chart, medications, test results, and follow up plan with the patient.   [place] Controlled Substance Database was reviewed by me.  This patient was seen by Jonetta Osgood, FNP-C in collaboration with Dr. Clayborn Bigness as a part of collaborative care agreement.   Glorya Bartley R. Valetta Fuller, MSN, FNP-C Internal medicine

## 2022-04-11 ENCOUNTER — Encounter: Payer: Self-pay | Admitting: Nurse Practitioner

## 2022-04-21 DIAGNOSIS — G4733 Obstructive sleep apnea (adult) (pediatric): Secondary | ICD-10-CM | POA: Diagnosis not present

## 2022-04-27 DIAGNOSIS — H401122 Primary open-angle glaucoma, left eye, moderate stage: Secondary | ICD-10-CM | POA: Diagnosis not present

## 2022-04-27 DIAGNOSIS — H26492 Other secondary cataract, left eye: Secondary | ICD-10-CM | POA: Diagnosis not present

## 2022-04-27 DIAGNOSIS — E119 Type 2 diabetes mellitus without complications: Secondary | ICD-10-CM | POA: Diagnosis not present

## 2022-04-27 DIAGNOSIS — H401113 Primary open-angle glaucoma, right eye, severe stage: Secondary | ICD-10-CM | POA: Diagnosis not present

## 2022-04-28 ENCOUNTER — Inpatient Hospital Stay: Payer: HMO | Attending: Oncology

## 2022-04-28 DIAGNOSIS — D509 Iron deficiency anemia, unspecified: Secondary | ICD-10-CM | POA: Diagnosis not present

## 2022-04-28 LAB — CBC WITH DIFFERENTIAL/PLATELET
Abs Immature Granulocytes: 0.02 10*3/uL (ref 0.00–0.07)
Basophils Absolute: 0 10*3/uL (ref 0.0–0.1)
Basophils Relative: 0 %
Eosinophils Absolute: 0.2 10*3/uL (ref 0.0–0.5)
Eosinophils Relative: 3 %
HCT: 38.5 % (ref 36.0–46.0)
Hemoglobin: 12.6 g/dL (ref 12.0–15.0)
Immature Granulocytes: 0 %
Lymphocytes Relative: 22 %
Lymphs Abs: 1.2 10*3/uL (ref 0.7–4.0)
MCH: 29.3 pg (ref 26.0–34.0)
MCHC: 32.7 g/dL (ref 30.0–36.0)
MCV: 89.5 fL (ref 80.0–100.0)
Monocytes Absolute: 0.5 10*3/uL (ref 0.1–1.0)
Monocytes Relative: 10 %
Neutro Abs: 3.7 10*3/uL (ref 1.7–7.7)
Neutrophils Relative %: 65 %
Platelets: 141 10*3/uL — ABNORMAL LOW (ref 150–400)
RBC: 4.3 MIL/uL (ref 3.87–5.11)
RDW: 12.8 % (ref 11.5–15.5)
WBC: 5.6 10*3/uL (ref 4.0–10.5)
nRBC: 0 % (ref 0.0–0.2)

## 2022-04-28 LAB — COMPREHENSIVE METABOLIC PANEL
ALT: 9 U/L (ref 0–44)
AST: 13 U/L — ABNORMAL LOW (ref 15–41)
Albumin: 3.7 g/dL (ref 3.5–5.0)
Alkaline Phosphatase: 58 U/L (ref 38–126)
Anion gap: 5 (ref 5–15)
BUN: 25 mg/dL — ABNORMAL HIGH (ref 8–23)
CO2: 27 mmol/L (ref 22–32)
Calcium: 9.6 mg/dL (ref 8.9–10.3)
Chloride: 109 mmol/L (ref 98–111)
Creatinine, Ser: 1.43 mg/dL — ABNORMAL HIGH (ref 0.44–1.00)
GFR, Estimated: 38 mL/min — ABNORMAL LOW (ref >60)
Glucose, Bld: 104 mg/dL — ABNORMAL HIGH (ref 70–99)
Potassium: 3.9 mmol/L (ref 3.5–5.1)
Sodium: 141 mmol/L (ref 135–145)
Total Bilirubin: 0.7 mg/dL (ref 0.3–1.2)
Total Protein: 6.4 g/dL — ABNORMAL LOW (ref 6.5–8.1)

## 2022-04-28 LAB — IRON AND TIBC
Iron: 73 ug/dL (ref 28–170)
Saturation Ratios: 28 % (ref 10.4–31.8)
TIBC: 263 ug/dL (ref 250–450)
UIBC: 190 ug/dL

## 2022-04-28 LAB — FERRITIN: Ferritin: 67 ng/mL (ref 11–307)

## 2022-05-02 ENCOUNTER — Other Ambulatory Visit: Payer: Self-pay | Admitting: Nurse Practitioner

## 2022-05-02 DIAGNOSIS — E1165 Type 2 diabetes mellitus with hyperglycemia: Secondary | ICD-10-CM

## 2022-05-03 ENCOUNTER — Other Ambulatory Visit: Payer: Self-pay | Admitting: Nurse Practitioner

## 2022-05-03 DIAGNOSIS — E1165 Type 2 diabetes mellitus with hyperglycemia: Secondary | ICD-10-CM

## 2022-05-18 ENCOUNTER — Other Ambulatory Visit: Payer: Self-pay | Admitting: Nurse Practitioner

## 2022-05-18 DIAGNOSIS — L080 Pyoderma: Secondary | ICD-10-CM

## 2022-05-20 ENCOUNTER — Other Ambulatory Visit: Payer: Self-pay

## 2022-05-20 DIAGNOSIS — H26492 Other secondary cataract, left eye: Secondary | ICD-10-CM | POA: Diagnosis not present

## 2022-05-25 DIAGNOSIS — E119 Type 2 diabetes mellitus without complications: Secondary | ICD-10-CM | POA: Diagnosis not present

## 2022-05-25 DIAGNOSIS — G5601 Carpal tunnel syndrome, right upper limb: Secondary | ICD-10-CM | POA: Diagnosis not present

## 2022-06-03 DIAGNOSIS — H26492 Other secondary cataract, left eye: Secondary | ICD-10-CM | POA: Diagnosis not present

## 2022-06-23 ENCOUNTER — Ambulatory Visit: Payer: HMO

## 2022-06-23 DIAGNOSIS — E559 Vitamin D deficiency, unspecified: Secondary | ICD-10-CM | POA: Diagnosis not present

## 2022-06-23 DIAGNOSIS — D638 Anemia in other chronic diseases classified elsewhere: Secondary | ICD-10-CM | POA: Diagnosis not present

## 2022-06-23 DIAGNOSIS — I10 Essential (primary) hypertension: Secondary | ICD-10-CM | POA: Diagnosis not present

## 2022-06-23 DIAGNOSIS — D351 Benign neoplasm of parathyroid gland: Secondary | ICD-10-CM | POA: Diagnosis not present

## 2022-06-23 DIAGNOSIS — N189 Chronic kidney disease, unspecified: Secondary | ICD-10-CM | POA: Diagnosis not present

## 2022-06-23 DIAGNOSIS — E1165 Type 2 diabetes mellitus with hyperglycemia: Secondary | ICD-10-CM | POA: Diagnosis not present

## 2022-06-23 DIAGNOSIS — E785 Hyperlipidemia, unspecified: Secondary | ICD-10-CM | POA: Diagnosis not present

## 2022-07-01 DIAGNOSIS — M80022G Age-related osteoporosis with current pathological fracture, left humerus, subsequent encounter for fracture with delayed healing: Secondary | ICD-10-CM | POA: Diagnosis not present

## 2022-07-01 DIAGNOSIS — N184 Chronic kidney disease, stage 4 (severe): Secondary | ICD-10-CM | POA: Diagnosis not present

## 2022-07-01 DIAGNOSIS — G4730 Sleep apnea, unspecified: Secondary | ICD-10-CM | POA: Diagnosis not present

## 2022-07-01 DIAGNOSIS — E1159 Type 2 diabetes mellitus with other circulatory complications: Secondary | ICD-10-CM | POA: Diagnosis not present

## 2022-07-01 DIAGNOSIS — E785 Hyperlipidemia, unspecified: Secondary | ICD-10-CM | POA: Diagnosis not present

## 2022-07-01 DIAGNOSIS — E6609 Other obesity due to excess calories: Secondary | ICD-10-CM | POA: Diagnosis not present

## 2022-07-01 DIAGNOSIS — D351 Benign neoplasm of parathyroid gland: Secondary | ICD-10-CM | POA: Diagnosis not present

## 2022-07-01 DIAGNOSIS — E559 Vitamin D deficiency, unspecified: Secondary | ICD-10-CM | POA: Diagnosis not present

## 2022-07-01 DIAGNOSIS — Z6837 Body mass index (BMI) 37.0-37.9, adult: Secondary | ICD-10-CM | POA: Diagnosis not present

## 2022-07-01 DIAGNOSIS — I10 Essential (primary) hypertension: Secondary | ICD-10-CM | POA: Diagnosis not present

## 2022-07-12 DIAGNOSIS — N184 Chronic kidney disease, stage 4 (severe): Secondary | ICD-10-CM | POA: Diagnosis not present

## 2022-07-12 DIAGNOSIS — I10 Essential (primary) hypertension: Secondary | ICD-10-CM | POA: Diagnosis not present

## 2022-07-12 DIAGNOSIS — E1122 Type 2 diabetes mellitus with diabetic chronic kidney disease: Secondary | ICD-10-CM | POA: Diagnosis not present

## 2022-07-12 DIAGNOSIS — E210 Primary hyperparathyroidism: Secondary | ICD-10-CM | POA: Diagnosis not present

## 2022-07-12 DIAGNOSIS — R809 Proteinuria, unspecified: Secondary | ICD-10-CM | POA: Diagnosis not present

## 2022-07-15 DIAGNOSIS — G5601 Carpal tunnel syndrome, right upper limb: Secondary | ICD-10-CM | POA: Diagnosis not present

## 2022-07-27 ENCOUNTER — Ambulatory Visit: Payer: HMO | Admitting: Internal Medicine

## 2022-07-29 DIAGNOSIS — G5601 Carpal tunnel syndrome, right upper limb: Secondary | ICD-10-CM | POA: Diagnosis not present

## 2022-08-04 ENCOUNTER — Ambulatory Visit (INDEPENDENT_AMBULATORY_CARE_PROVIDER_SITE_OTHER): Payer: HMO

## 2022-08-04 DIAGNOSIS — G4733 Obstructive sleep apnea (adult) (pediatric): Secondary | ICD-10-CM | POA: Diagnosis not present

## 2022-08-04 NOTE — Progress Notes (Signed)
95 percentile pressure 8.5   95th percentile leak 3.9   apnea-hypopnea index  0.8 /hr   total days used  >4 hr 90 days  total days used <4 hr 0 days  Total compliance 100 percent  She is doing great. I will check on supplies.   Pt was seen by Tresa Endo  RRT/RCP  from [facility]

## 2022-08-09 ENCOUNTER — Ambulatory Visit (INDEPENDENT_AMBULATORY_CARE_PROVIDER_SITE_OTHER): Payer: HMO | Admitting: Internal Medicine

## 2022-08-09 ENCOUNTER — Encounter: Payer: Self-pay | Admitting: Internal Medicine

## 2022-08-09 ENCOUNTER — Telehealth: Payer: Self-pay | Admitting: Internal Medicine

## 2022-08-09 VITALS — BP 140/70 | HR 69 | Temp 98.1°F | Resp 16 | Ht 63.0 in | Wt 214.2 lb

## 2022-08-09 DIAGNOSIS — G4733 Obstructive sleep apnea (adult) (pediatric): Secondary | ICD-10-CM

## 2022-08-09 DIAGNOSIS — I5032 Chronic diastolic (congestive) heart failure: Secondary | ICD-10-CM | POA: Diagnosis not present

## 2022-08-09 DIAGNOSIS — N1832 Chronic kidney disease, stage 3b: Secondary | ICD-10-CM

## 2022-08-09 NOTE — Progress Notes (Signed)
[facility] [address] [place], [place] [address]  Pulmonary Sleep Medicine   Office Visit Note  Patient Name: Frances Greene DOB: 1945/06/02 MRN [id number]  Date of Service: 08/09/2022  Complaints/HPI: OSA on treatment. She has excellent compliance and she states that it has been helping her quite a lot. She feels less sleepy during the daytime. She rests well at night no issues with device. No sinus congestion or pain is noted. Supplies are up to date. She states her renal function has not changed much. She also noted her heart is compensated at this timwe  Office Spirometry Results:     ROS  General: (-) fever, (-) chills, (-) night sweats, (-) weakness Skin: (-) rashes, (-) itching,. Eyes: (-) visual changes, (-) redness, (-) itching. Nose and Sinuses: (-) nasal stuffiness or itchiness, (-) postnasal drip, (-) nosebleeds, (-) sinus trouble. Mouth and Throat: (-) sore throat, (-) hoarseness. Neck: (-) swollen glands, (-) enlarged thyroid, (-) neck pain. Respiratory: - cough, (-) bloody sputum, - shortness of breath, - wheezing. Cardiovascular: - ankle swelling, (-) chest pain. Lymphatic: (-) lymph node enlargement. Neurologic: (-) numbness, (-) tingling. Psychiatric: (-) anxiety, (-) depression   Current Medication: Outpatient Encounter Medications as of 08/09/2022  Medication Sig   acetaminophen (TYLENOL) 325 MG tablet Take 650 mg by mouth every 6 (six) hours as needed.   Alcohol Swabs PADS Use 1 alcohol swab with glucose meter and with injectable medication daily.   AMBULATORY NON FORMULARY MEDICATION Medication Name: geni kot natural veggie laxitive   atorvastatin (LIPITOR) 10 MG tablet TAKE ONE TABLET BY MOUTH ONCE DAILY   betamethasone dipropionate 0.05 % cream Apply topically 2 (two) times daily.   bisoprolol (ZEBETA) 5 MG tablet TAKE ONE TABLET BY MOUTH ONCE DAILY   Blood Glucose Monitoring Suppl (ONETOUCH VERIO REFLECT) w/Device KIT Use as  directed for once daily Dx E11.65   Cholecalciferol (VITAMIN D3) 1.25 MG (50000 UT) CAPS Take by mouth daily.   cinacalcet (SENSIPAR) 30 MG tablet    citalopram (CELEXA) 10 MG tablet TAKE ONE TABLET BY MOUTH ONCE DAILY   COMFORT EZ PEN NEEDLES 32G X 4 MM MISC USE WITH victoza injections DAILY   cyanocobalamin (VITAMIN B12) 100 MCG tablet Take 100 mcg by mouth daily.   febuxostat (ULORIC) 40 MG tablet TAKE ONE TABLET BY MOUTH ONCE DAILY   ferrous sulfate 325 (65 FE) MG tablet Take 325 mg by mouth daily with breakfast.   fluticasone-salmeterol (ADVAIR) 100-50 MCG/ACT AEPB Inhale 1 puff into the lungs 2 (two) times daily.   folic acid (FOLVITE) 1 MG tablet TAKE ONE TABLET BY MOUTH ONCE DAILY   furosemide (LASIX) 80 MG tablet TAKE ONE TABLET BY MOUTH EVERY MORNING   Garlic 1000 MG CAPS Take by mouth.   glucose blood (ONETOUCH VERIO) test strip Use as directed once a daily   hydrALAZINE (APRESOLINE) 25 MG tablet TAKE ONE TABLET BY MOUTH THREE TIMES DAILY   HYDROcodone-acetaminophen (NORCO/VICODIN) 5-325 MG tablet Take 1 tablet by mouth every 6 (six) hours as needed for moderate pain or severe pain.   JARDIANCE 10 MG TABS tablet TAKE ONE TABLET BY MOUTH ONCE DAILY   ketoconazole (NIZORAL) 2 % shampoo SHAMPOO AS DIRECTED   latanoprost (XALATAN) 0.005 % ophthalmic solution latanoprost 0.005 % eye drops  INSTILL 1 DROP INTO EACH EYE IN THE EVENING   liraglutide (VICTOZA) 18 MG/3ML SOPN INJECT 1.8 MG ONCE DAILY SUBCUTANEOUSLY   meloxicam (MOBIC) 7.5 MG tablet TAKE 1 TABLET  BY MOUTH ONCE DAILY FOR  GOUT  AND  ARTHRITIS   mirabegron ER (MYRBETRIQ) 50 MG TB24 tablet Take 1 tablet (50 mg total) by mouth daily.   montelukast (SINGULAIR) 10 MG tablet TAKE ONE TABLET BY MOUTH EVERY EVENING   NON FORMULARY CPAP pt uses [facility] for CPAP   Omega-3 Fatty Acids (FISH OIL PO) Take by mouth.   OneTouch Delica Lancets 33G MISC Use as directed once a daily DxE11.65   senna (SENOKOT) 8.6 MG TABS tablet  Take 1 tablet by mouth.   Sulfacetamide Sodium (SODIUM SULFACETAMIDE) 10 % SHAM Apply topically.   tizanidine (ZANAFLEX) 2 MG capsule Take one tab po qhs for spasm   traZODone (DESYREL) 50 MG tablet Take 0.5-1 tablets (25-50 mg total) by mouth at bedtime as needed for sleep.   valACYclovir (VALTREX) 500 MG tablet TAKE ONE TABLET BY MOUTH ONCE DAILY   Vitamin D, Ergocalciferol, (DRISDOL) 1.25 MG (50000 UNIT) CAPS capsule TAKE ONE CAPSULE BY MOUTH ONCE WEEKLY ON MONDAY   No facility-administered encounter medications on file as of 08/09/2022.    Surgical History: Past Surgical History:  Procedure Laterality Date   ABDOMINAL HYSTERECTOMY     BREAST BIOPSY Left 1989   neg   CATARACT EXTRACTION W/PHACO Right 05/21/2015   Procedure: CATARACT EXTRACTION PHACO AND INTRAOCULAR LENS PLACEMENT (IOC);  Surgeon: Marcelene Butte, MD;  Location: [facility] ORS;  Service: Ophthalmology;  Laterality: Right;  [place]: AP%: 10.0 CDE: 9.53   CATARACT EXTRACTION W/PHACO Left 12/31/2020   Procedure: CATARACT EXTRACTION PHACO AND INTRAOCULAR LENS PLACEMENT (IOC) LEFT DIABETIC 4.13 01:00.9;  Surgeon: Lockie Mola, MD;  Location: [facility];  Service: Ophthalmology;  Laterality: Left;   COLONOSCOPY     JOINT REPLACEMENT Bilateral    knee   PARS PLANA VITRECTOMY Right 05/21/2015   Procedure: PARS PLANA VITRECTOMY WITH 25 GAUGE;  Surgeon: Marcelene Butte, MD;  Location: [facility] OR[facility];  Service: Ophthalmology;  Laterality: Right;  Lot # [id number] H Endo laser: Watts: 300 Pulse Duration: 200 Total Pulses:143    shoulder surgery      Medical History: Past Medical History:  Diagnosis Date   Arthritis    Asthma    Bronchitis    CHF (congestive heart failure) (HCC)    Chronic kidney disease    decreased kidney function   Diabetes mellitus without complication (HCC)    Gout    Heart murmur    Hypertension    Multiple allergies    Shortness of breath dyspnea    w/ exertion   Sleep apnea    CPAP   Swelling  of both lower extremities    Wheezing     Family History: Family History  Problem Relation Age of Onset   Breast cancer Cousin    Breast cancer Other    Hypertension Mother    Diabetes Mother    Hypertension Father    Diabetes Father    Alcohol abuse Father    Aneurysm Sister    Alcohol abuse Sister    Stroke Sister        died from stroke   Hepatitis Brother    Hypertension Sister        dialysis   Diabetes Sister    Alcoholism Brother    Alcoholism Brother        dies from head injury from a fall.  not an alcoholic,   Cancer Brother    Diabetes Brother    Hypertension Brother    Diabetes Brother  Cancer Brother    Hypertension Brother    Stroke Brother    Alcoholism Brother     Social History: Social History   Socioeconomic History   Marital status: Divorced    Spouse name: Not on file   Number of children: Not on file   Years of education: Not on file   Highest education level: Not on file  Occupational History   Not on file  Tobacco Use   Smoking status: Former    Types: Cigarettes   Smokeless tobacco: Never   Tobacco comments:    4 cigarettes quit 20 years ago  Vaping Use   Vaping status: Never Used  Substance and Sexual Activity   Alcohol use: No   Drug use: Never   Sexual activity: Not on file  Other Topics Concern   Not on file  Social History Narrative   Not on file   Social Determinants of Health   Financial Resource Strain: Medium Risk (06/01/2021)   [facility]   Overall Financial Resource Strain (CARDIA)    Difficulty of Paying Living Expenses: Somewhat hard  Food Insecurity: No Food Insecurity (03/11/2022)   [facility]   Hunger Vital Sign    Worried About Running Out of Food in the Last Year: Never true    Ran Out of Food in the Last Year: Never true  Transportation Needs: No Transportation Needs (03/11/2022)   [facility]   [facility] - Transportation    Lack of Transportation (Medical): No    Lack of Transportation (Non-Medical): No  Physical Activity: Not on file  Stress: Not on file  Social Connections: Not on file  Intimate Partner Violence: Not on file    Vital Signs: Blood pressure (!) 140/70, pulse 69, temperature 98.1 F (36.7 C), resp. rate 16, height 5\' 3"  (1.6 m), weight 214 lb 3.2 oz (97.2 kg), SpO2 91%.  Examination: General Appearance: The patient is well-developed, well-nourished, and in no distress. Skin: Gross inspection of skin unremarkable. Head: normocephalic, no gross deformities. Eyes: no gross deformities noted. ENT: ears appear grossly normal no exudates. Neck: Supple. No thyromegaly. No LAD. Respiratory: no rhonci noted. Cardiovascular: Normal S1 and S2 without murmur or rub. Extremities: No cyanosis. pulses are equal. Neurologic: Alert and oriented. No involuntary movements.  LABS: No results found for this or any previous visit (from the past 2160 hour(s)).  Radiology: MM DIAG BREAST TOMO BILATERAL  Result Date: 03/10/2021 CLINICAL DATA:  Patient recalled from screening for right breast calcifications and left breast retroareolar swelling. Patient predominately sleeps on her left side and has issues with edema. She states this has worsened over the last 3 months. EXAM: DIGITAL DIAGNOSTIC BILATERAL MAMMOGRAM WITH TOMOSYNTHESIS AND CAD; ULTRASOUND LEFT BREAST LIMITED TECHNIQUE: Bilateral digital diagnostic mammography and breast tomosynthesis was performed. The images were evaluated with computer-aided detection.; Targeted ultrasound examination of the left breast was performed. COMPARISON:  Previous exam(s). ACR Breast Density Category c: The breast tissue is heterogeneously dense, which may obscure small masses. FINDINGS: Magnification views of the right breast demonstrate the calcifications to represent vascular calcifications. Additional imaging of the left breast  demonstrates persistent periareolar skin thickening as well as retroareolar trabecular and parenchymal thickening. On physical exam, there is thickening of the periareolar skin of the left breast without skin discoloration identified. Targeted ultrasound is performed, showing skin thickening within the periareolar left breast. There is edematous change identified within the  retroareolar tissues of the left breast. IMPRESSION: 1. Periareolar skin thickening and retroareolar trabecular and parenchymal thickening are nonspecific however favored to be secondary to edematous change. Possibility of malignant process is much less likely. 2. Benign right breast vascular calcifications. RECOMMENDATION: Surgical consultation for possible skin punch biopsy of the periareolar left breast for definitive diagnosis. Additionally, patient was instructed to return in 3 months for re-evaluation of the left breast. I have discussed the findings and recommendations with the patient. If applicable, a reminder letter will be sent to the patient regarding the next appointment. BI-RADS CATEGORY  3: Probably benign. Electronically Signed   By: Annia Belt M.D.   On: 03/10/2021 15:44  US BREAST LTD UNI LEFT INC AXILLA  Result Date: 03/10/2021 CLINICAL DATA:  Patient recalled from screening for right breast calcifications and left breast retroareolar swelling. Patient predominately sleeps on her left side and has issues with edema. She states this has worsened over the last 3 months. EXAM: DIGITAL DIAGNOSTIC BILATERAL MAMMOGRAM WITH TOMOSYNTHESIS AND CAD; ULTRASOUND LEFT BREAST LIMITED TECHNIQUE: Bilateral digital diagnostic mammography and breast tomosynthesis was performed. The images were evaluated with computer-aided detection.; Targeted ultrasound examination of the left breast was performed. COMPARISON:  Previous exam(s). ACR Breast Density Category c: The breast tissue is heterogeneously dense, which may obscure small masses.  FINDINGS: Magnification views of the right breast demonstrate the calcifications to represent vascular calcifications. Additional imaging of the left breast demonstrates persistent periareolar skin thickening as well as retroareolar trabecular and parenchymal thickening. On physical exam, there is thickening of the periareolar skin of the left breast without skin discoloration identified. Targeted ultrasound is performed, showing skin thickening within the periareolar left breast. There is edematous change identified within the retroareolar tissues of the left breast. IMPRESSION: 1. Periareolar skin thickening and retroareolar trabecular and parenchymal thickening are nonspecific however favored to be secondary to edematous change. Possibility of malignant process is much less likely. 2. Benign right breast vascular calcifications. RECOMMENDATION: Surgical consultation for possible skin punch biopsy of the periareolar left breast for definitive diagnosis. Additionally, patient was instructed to return in 3 months for re-evaluation of the left breast. I have discussed the findings and recommendations with the patient. If applicable, a reminder letter will be sent to the patient regarding the next appointment. BI-RADS CATEGORY  3: Probably benign. Electronically Signed   By: Annia Belt M.D.   On: 03/10/2021 15:44   No results found.  No results found.  Assessment and Plan: Patient Active Problem List   Diagnosis Date Noted   Chronic heart failure with preserved ejection fraction (HCC) 06/11/2021   Thrombocytopenia (HCC) 06/11/2021   B12 deficiency 03/19/2020   Anemia in stage 3b chronic kidney disease (HCC) 03/19/2020   Acute bilateral low back pain with right-sided sciatica 11/12/2018   Closed fracture of right proximal humerus 11/12/2018   Type 2 diabetes mellitus with hyperglycemia (HCC) 10/28/2018   Flu vaccine need 10/28/2018   Pain due to onychomycosis of toenails of both feet 07/17/2018    Type 2 diabetes mellitus with vascular disease (HCC) 07/17/2018   Other fatigue 11/14/2017   Moderate episode of recurrent major depressive disorder (HCC) 11/14/2017   Chronic allergic rhinitis 11/14/2017   Primary insomnia 11/14/2017   Uncontrolled type 2 diabetes mellitus with hyperglycemia (HCC) 09/18/2017   Acute pain of right shoulder 09/18/2017   Type 2 diabetes mellitus with chronic kidney disease (HCC) 01/19/2017   Anosmia 01/19/2017   Cervicalgia 01/19/2017   Vitamin D deficiency 01/19/2017  Hypoglycemia 01/19/2017   OSA (obstructive sleep apnea) 01/19/2017   Essential hypertension, benign 01/19/2017   Mixed hyperlipidemia 01/19/2017   Anemia, unspecified 01/19/2017   Edema 01/19/2017   SOB (shortness of breath) 01/19/2017   Gout 01/19/2017   Morbid obesity (HCC) 01/19/2017   Stage 3 chronic kidney disease (HCC) 01/19/2017   Hypercalcemia 01/19/2017   Iron deficiency anemia 01/14/2016   Acute pain of right knee 04/23/2015   Left wrist pain 08/12/2014   Status post bilateral unicompartmental knee replacement 11/13/2013   Arthritis, senescent 08/02/2013    1. Obstructive sleep apnea [G47.33] Under good control will continue with her CPAP at the current pressure settings. Patient will make certain her supplies are update. She had excellent compliance noted  2. Stage 3b chronic kidney disease (HCC) She continues to follow with Nephrology  3. Chronic heart failure with preserved ejection fraction (HCC) Maintains urine output and also watch fluid balance. Would suggest follow up Echo    General Counseling: I have discussed the findings of the evaluation and examination with Frances Greene.  I have also discussed any further diagnostic evaluation thatmay be needed or ordered today. Frances Greene verbalizes understanding of the findings of todays visit. We also reviewed her medications today and discussed drug interactions and side effects including but not limited excessive drowsiness  and altered mental states. We also discussed that there is always a risk not just to her but also people around her. she has been encouraged to call the office with any questions or concerns that should arise related to todays visit.  No orders of the defined types were placed in this encounter.    Time spent: 76  I have personally obtained a history, examined the patient, evaluated laboratory and imaging results, formulated the assessment and plan and placed orders.    Yevonne Pax, MD [facility] Pulmonary and Critical Care Sleep medicine

## 2022-08-09 NOTE — Telephone Encounter (Signed)
Lvm notifying patient of echo appointment date, arrival time, location-[place]

## 2022-08-25 ENCOUNTER — Telehealth: Payer: Self-pay | Admitting: Nurse Practitioner

## 2022-08-25 ENCOUNTER — Ambulatory Visit: Admission: RE | Admit: 2022-08-25 | Payer: HMO | Source: Ambulatory Visit

## 2022-08-25 NOTE — Telephone Encounter (Signed)
Faxed cpap supply order to [facility]; [phone]

## 2022-09-13 ENCOUNTER — Encounter: Payer: Self-pay | Admitting: Nurse Practitioner

## 2022-09-13 ENCOUNTER — Ambulatory Visit (INDEPENDENT_AMBULATORY_CARE_PROVIDER_SITE_OTHER): Payer: HMO | Admitting: Nurse Practitioner

## 2022-09-13 VITALS — BP 128/60 | HR 67 | Temp 98.3°F | Resp 16 | Ht 63.0 in | Wt 210.8 lb

## 2022-09-13 DIAGNOSIS — Z1211 Encounter for screening for malignant neoplasm of colon: Secondary | ICD-10-CM | POA: Diagnosis not present

## 2022-09-13 DIAGNOSIS — E785 Hyperlipidemia, unspecified: Secondary | ICD-10-CM

## 2022-09-13 DIAGNOSIS — Z0000 Encounter for general adult medical examination without abnormal findings: Secondary | ICD-10-CM | POA: Diagnosis not present

## 2022-09-13 DIAGNOSIS — R928 Other abnormal and inconclusive findings on diagnostic imaging of breast: Secondary | ICD-10-CM

## 2022-09-13 DIAGNOSIS — Z1231 Encounter for screening mammogram for malignant neoplasm of breast: Secondary | ICD-10-CM

## 2022-09-13 DIAGNOSIS — G4733 Obstructive sleep apnea (adult) (pediatric): Secondary | ICD-10-CM

## 2022-09-13 DIAGNOSIS — E1169 Type 2 diabetes mellitus with other specified complication: Secondary | ICD-10-CM | POA: Diagnosis not present

## 2022-09-13 DIAGNOSIS — I5032 Chronic diastolic (congestive) heart failure: Secondary | ICD-10-CM

## 2022-09-13 DIAGNOSIS — I129 Hypertensive chronic kidney disease with stage 1 through stage 4 chronic kidney disease, or unspecified chronic kidney disease: Secondary | ICD-10-CM | POA: Diagnosis not present

## 2022-09-13 DIAGNOSIS — D631 Anemia in chronic kidney disease: Secondary | ICD-10-CM | POA: Diagnosis not present

## 2022-09-13 DIAGNOSIS — E1122 Type 2 diabetes mellitus with diabetic chronic kidney disease: Secondary | ICD-10-CM

## 2022-09-13 DIAGNOSIS — N1832 Chronic kidney disease, stage 3b: Secondary | ICD-10-CM

## 2022-09-13 DIAGNOSIS — N183 Chronic kidney disease, stage 3 unspecified: Secondary | ICD-10-CM | POA: Diagnosis not present

## 2022-09-13 DIAGNOSIS — Z1212 Encounter for screening for malignant neoplasm of rectum: Secondary | ICD-10-CM

## 2022-09-13 DIAGNOSIS — E1159 Type 2 diabetes mellitus with other circulatory complications: Secondary | ICD-10-CM

## 2022-09-13 LAB — POCT GLYCOSYLATED HEMOGLOBIN (HGB A1C): Hemoglobin A1C: 6.2 % — AB (ref 4.0–5.6)

## 2022-09-13 NOTE — Progress Notes (Deleted)
[facility] [address] [place], [place] [address]  Internal MEDICINE  Office Visit Note  Patient Name: Frances Greene  [id number]  [id number]  Date of Service: 09/13/2022  Chief Complaint  Patient presents with   Diabetes   Hypertension   Medicare Wellness    HPI Skai presents for an annual well visit and physical exam.  Well-appearing 77 y.o. female with  Routine CRC screening: due for cologuard.  Routine mammogram: due now  DEXA scan: will look at records from [facility] office.  Eye exam: see already this year, has follow up appt next month foot exam: Labs: due fo routine labs  New or worsening pain: none  Other concerns: A1c improved to 6.2      09/13/2022    2:11 PM 09/07/2021    2:17 PM 09/04/2020    9:07 AM  MMSE - Mini Mental State Exam  Orientation to time 5 5 5   Orientation to Place 5 5 5   Registration 3 3 3   Attention/ Calculation 5 5 5   Recall 3 3 3   Language- name 2 objects 2 2 2   Language- repeat 1 1 1   Language- follow 3 step command 3 3 3   Language- read & follow direction 1 1 1   Write a sentence 1 1 1   Copy design 1 1 1   Total score 30 30 30     Functional Status Survey: Is the patient deaf or have difficulty hearing?: No Does the patient have difficulty seeing, even when wearing glasses/contacts?: No Does the patient have difficulty concentrating, remembering, or making decisions?: No Does the patient have difficulty walking or climbing stairs?: Yes Does the patient have difficulty dressing or bathing?: No Does the patient have difficulty doing errands alone such as visiting a doctor's office or shopping?: No     06/04/2021   10:19 AM 09/07/2021    2:19 PM 10/06/2021    2:44 PM 04/08/2022    2:45 PM 09/13/2022    2:10 PM  Fall Risk  Falls in the past year? 0 0 0 0 0  Was there an injury with Fall?   0 0 0  Fall Risk Category Calculator   0 0 0  Fall Risk Category (Retired)   Low    (RETIRED) Patient Fall Risk Level   Low fall risk Low fall risk    Patient at Risk for Falls Due to  No Fall Risks No Fall Risks No Fall Risks No Fall Risks  Fall risk Follow up  Falls evaluation completed Falls evaluation completed Falls evaluation completed Falls evaluation completed       09/13/2022    2:10 PM  Depression screen PHQ 2/9  Decreased Interest 0  Down, Depressed, Hopeless 0  PHQ - 2 Score 0        No data to display            Current Medication: Outpatient Encounter Medications as of 09/13/2022  Medication Sig   acetaminophen (TYLENOL) 325 MG tablet Take 650 mg by mouth every 6 (six) hours as needed.   Alcohol Swabs PADS Use 1 alcohol swab with glucose meter and with injectable medication daily.   AMBULATORY NON FORMULARY MEDICATION Medication Name: geni kot natural veggie laxitive   atorvastatin (LIPITOR) 10 MG tablet TAKE ONE TABLET BY MOUTH ONCE DAILY   betamethasone dipropionate 0.05 % cream Apply topically 2 (two) times daily.   bisoprolol (ZEBETA) 5 MG tablet TAKE ONE TABLET BY MOUTH ONCE DAILY   Blood Glucose  Monitoring Suppl (ONETOUCH VERIO REFLECT) w/Device KIT Use as directed for once daily Dx E11.65   Cholecalciferol (VITAMIN D3) 1.25 MG (50000 UT) CAPS Take by mouth daily.   cinacalcet (SENSIPAR) 30 MG tablet    citalopram (CELEXA) 10 MG tablet TAKE ONE TABLET BY MOUTH ONCE DAILY   COMFORT EZ PEN NEEDLES 32G X 4 MM MISC USE WITH victoza injections DAILY   cyanocobalamin (VITAMIN B12) 100 MCG tablet Take 100 mcg by mouth daily.   febuxostat (ULORIC) 40 MG tablet TAKE ONE TABLET BY MOUTH ONCE DAILY   ferrous sulfate 325 (65 FE) MG tablet Take 325 mg by mouth daily with breakfast.   fluticasone-salmeterol (ADVAIR) 100-50 MCG/ACT AEPB Inhale 1 puff into the lungs 2 (two) times daily.   folic acid (FOLVITE) 1 MG tablet TAKE ONE TABLET BY MOUTH ONCE DAILY   furosemide (LASIX) 80 MG tablet TAKE ONE TABLET BY MOUTH EVERY MORNING   Garlic 1000 MG CAPS Take by mouth.   glucose blood  (ONETOUCH VERIO) test strip Use as directed once a daily   hydrALAZINE (APRESOLINE) 25 MG tablet TAKE ONE TABLET BY MOUTH THREE TIMES DAILY   HYDROcodone-acetaminophen (NORCO/VICODIN) 5-325 MG tablet Take 1 tablet by mouth every 6 (six) hours as needed for moderate pain or severe pain.   JARDIANCE 10 MG TABS tablet TAKE ONE TABLET BY MOUTH ONCE DAILY   ketoconazole (NIZORAL) 2 % shampoo SHAMPOO AS DIRECTED   latanoprost (XALATAN) 0.005 % ophthalmic solution latanoprost 0.005 % eye drops  INSTILL 1 DROP INTO EACH EYE IN THE EVENING   liraglutide (VICTOZA) 18 MG/3ML SOPN INJECT 1.8 MG ONCE DAILY SUBCUTANEOUSLY   meloxicam (MOBIC) 7.5 MG tablet TAKE 1 TABLET BY MOUTH ONCE DAILY FOR  GOUT  AND  ARTHRITIS   mirabegron ER (MYRBETRIQ) 50 MG TB24 tablet Take 1 tablet (50 mg total) by mouth daily.   montelukast (SINGULAIR) 10 MG tablet TAKE ONE TABLET BY MOUTH EVERY EVENING   NON FORMULARY CPAP pt uses [facility] for CPAP   Omega-3 Fatty Acids (FISH OIL PO) Take by mouth.   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   Sulfacetamide Sodium (SODIUM SULFACETAMIDE) 10 % SHAM Apply topically.   tizanidine (ZANAFLEX) 2 MG capsule Take one tab po qhs for spasm   traZODone (DESYREL) 50 MG tablet Take 0.5-1 tablets (25-50 mg total) by mouth at bedtime as needed for sleep.   valACYclovir (VALTREX) 500 MG tablet TAKE ONE TABLET BY MOUTH ONCE DAILY   Vitamin D, Ergocalciferol, (DRISDOL) 1.25 MG (50000 UNIT) CAPS capsule TAKE ONE CAPSULE BY MOUTH ONCE WEEKLY ON MONDAY   No facility-administered encounter medications on file as of 09/13/2022.    Surgical History: Past Surgical History:  Procedure Laterality Date   ABDOMINAL HYSTERECTOMY     BREAST BIOPSY Left 1989   neg   CATARACT EXTRACTION W/PHACO Right 05/21/2015   Procedure: CATARACT EXTRACTION PHACO AND INTRAOCULAR LENS PLACEMENT (IOC);  Surgeon: Marcelene Butte, MD;  Location: [facility] ORS;  Service: Ophthalmology;  Laterality: Right;  [place]: AP%: 10.0 CDE: 9.53   CATARACT EXTRACTION W/PHACO Left 12/31/2020   Procedure: CATARACT EXTRACTION PHACO AND INTRAOCULAR LENS PLACEMENT (IOC) LEFT DIABETIC 4.13 01:00.9;  Surgeon: Lockie Mola, MD;  Location: [facility];  Service: Ophthalmology;  Laterality: Left;   COLONOSCOPY     JOINT REPLACEMENT Bilateral    knee   PARS PLANA VITRECTOMY  Right 05/21/2015   Procedure: PARS PLANA VITRECTOMY WITH 25 GAUGE;  Surgeon: Marcelene Butte, MD;  Location: [facility] OR[facility];  Service: Ophthalmology;  Laterality: Right;  Lot # [id number] H Endo laser: Watts: 300 Pulse Duration: 200 Total Pulses:143    shoulder surgery      Medical History: Past Medical History:  Diagnosis Date   Arthritis    Asthma    Bronchitis    CHF (congestive heart failure) (HCC)    Chronic kidney disease    decreased kidney function   Diabetes mellitus without complication (HCC)    Gout    Heart murmur    Hypertension    Multiple allergies    Shortness of breath dyspnea    w/ exertion   Sleep apnea    CPAP   Swelling of both lower extremities    Wheezing     Family History: Family History  Problem Relation Age of Onset   Breast cancer Cousin    Breast cancer Other    Hypertension Mother    Diabetes Mother    Hypertension Father    Diabetes Father    Alcohol abuse Father    Aneurysm Sister    Alcohol abuse Sister    Stroke Sister        died from stroke   Hepatitis Brother    Hypertension Sister        dialysis   Diabetes Sister    Alcoholism Brother    Alcoholism Brother        dies from head injury from a fall.  not an alcoholic,   Cancer Brother    Diabetes Brother    Hypertension Brother    Diabetes Brother    Cancer Brother    Hypertension Brother    Stroke Brother    Alcoholism Brother     Social History   Socioeconomic History   Marital status: Divorced    Spouse name: Not on file   Number  of children: Not on file   Years of education: Not on file   Highest education level: Not on file  Occupational History   Not on file  Tobacco Use   Smoking status: Former    Types: Cigarettes   Smokeless tobacco: Never   Tobacco comments:    4 cigarettes quit 20 years ago  Vaping Use   Vaping status: Never Used  Substance and Sexual Activity   Alcohol use: No   Drug use: Never   Sexual activity: Not on file  Other Topics Concern   Not on file  Social History Narrative   Not on file   Social Determinants of Health   Financial Resource Strain: Medium Risk (06/01/2021)   Received from [facility]   Overall Financial Resource Strain (CARDIA)    Difficulty of Paying Living Expenses: Somewhat hard  Food Insecurity: No Food Insecurity (03/11/2022)   [facility]   Hunger Vital Sign    Worried About Running Out of Food in the Last Year: Never true    Ran Out of Food in the Last Year: Never true  Transportation Needs: No Transportation Needs (03/11/2022)   [facility]   [facility] - Transportation    Lack of Transportation (Medical): No    Lack of Transportation (Non-Medical): No  Physical Activity: Not on file  Stress: Not on file  Social Connections: Not on file  Intimate Partner Violence: Not on file      Review  of Systems  Vital Signs: BP (!) 128/59   Pulse 67   Temp 98.3 F (36.8 C)   Resp 16   Ht 5\' 3"  (1.6 m)   Wt 210 lb 12.8 oz (95.6 kg)   SpO2 97%   BMI 37.34 kg/m    Physical Exam     Assessment/Plan: 1. Type 2 diabetes mellitus with vascular disease (HCC) - POCT glycosylated hemoglobin (Hb A1C)  2. Encounter for screening mammogram for malignant neoplasm of breast - MM 3D SCREENING MAMMOGRAM BILATERAL BREAST; Future  3. Screening for colorectal cancer - Cologuard     General Counseling: taionna mylin understanding of the findings of todays  visit and agrees with plan of treatment. I have discussed any further diagnostic evaluation that may be needed or ordered today. We also reviewed her medications today. she has been encouraged to call the office with any questions or concerns that should arise related to todays visit.    Orders Placed This Encounter  Procedures   MM 3D SCREENING MAMMOGRAM BILATERAL BREAST   Cologuard   POCT glycosylated hemoglobin (Hb A1C)    No orders of the defined types were placed in this encounter.   Return follow up visit in 6 months, for  reschedule echo please, need to request records from [facility] office pls --bone density .   Total time spent:*** Minutes Time spent includes review of chart, medications, test results, and follow up plan with the patient.   [place] Controlled Substance Database was reviewed by me.  This patient was seen by Sallyanne Kuster, FNP-C in collaboration with Dr. Beverely Risen as a part of collaborative care agreement.  Melanny Wire R. Tedd Sias, MSN, FNP-C Internal medicine

## 2022-09-14 ENCOUNTER — Telehealth: Payer: Self-pay | Admitting: Nurse Practitioner

## 2022-09-14 NOTE — Telephone Encounter (Signed)
Received MR from Dr [employer] offices. Sent to be scanned-nm

## 2022-09-29 DIAGNOSIS — Z1212 Encounter for screening for malignant neoplasm of rectum: Secondary | ICD-10-CM | POA: Diagnosis not present

## 2022-09-29 DIAGNOSIS — Z1211 Encounter for screening for malignant neoplasm of colon: Secondary | ICD-10-CM | POA: Diagnosis not present

## 2022-10-06 ENCOUNTER — Ambulatory Visit
Admission: RE | Admit: 2022-10-06 | Discharge: 2022-10-06 | Disposition: A | Discharge status: Home / Self Care | Payer: HMO | Source: Ambulatory Visit | Attending: Nurse Practitioner | Admitting: Nurse Practitioner

## 2022-10-06 DIAGNOSIS — R609 Edema, unspecified: Secondary | ICD-10-CM | POA: Diagnosis not present

## 2022-10-06 DIAGNOSIS — R928 Other abnormal and inconclusive findings on diagnostic imaging of breast: Secondary | ICD-10-CM | POA: Insufficient documentation

## 2022-10-06 DIAGNOSIS — R92333 Mammographic heterogeneous density, bilateral breasts: Secondary | ICD-10-CM | POA: Diagnosis not present

## 2022-10-09 LAB — COLOGUARD: COLOGUARD: NEGATIVE

## 2022-10-10 ENCOUNTER — Encounter: Payer: Self-pay | Admitting: Nurse Practitioner

## 2022-10-10 DIAGNOSIS — E1122 Type 2 diabetes mellitus with diabetic chronic kidney disease: Secondary | ICD-10-CM | POA: Insufficient documentation

## 2022-10-10 NOTE — Progress Notes (Signed)
[facility] [address] [place], [place] [address] [phone]  Patient name: Frances Greene DOB: [birth year] MRN: [id number]  Date of Service: 09/13/22   Medicare Annual Wellness Visit (Subsequent)  PCP: Sallyanne Kuster, NP Pulmonologist: Dr. Freda Munro, [facility] Nephrologist: Dr. Cherylann Ratel, [facility] last seen June 2024 Heme/Onc: Dr. Smith Robert, [facility], last seen October 2023 DME Provider: [facility] for CPAP Eye doctor: Dr. Inez Pilgrim, [facility], last seen in may 2024   Subjective:  CC -- Annual wellness visit.  Additional concerns? no Patient reports she missed her echocardiogram ordered by Dr. Freda Munro and she needs to reschedule it.  She has several comorbid conditions and has multiple specialists. Her health status is stable. Patient reports she feels well and states that she is blessed.   General Healthcare: Medication Compliance: yes  Cardiac Risk as of 09/13/2022(date): 29.4% 10-year ASCVD risk  Aspirin: yes Dx Hypertension: yes;  if yes, on medication? Hydralazine, bisoprolol, and furosemide Dx Hyperlipidemia: yes most recent lipid panel? Done in June 2024 On meds? atorvastatin Diabetes: yes, A1c is stable at 6.2 today. Dx Obesity: yes, BMI is elevated at 37.34. Weight Loss: no  Urinary Incontinence: yes, stable on mybetriq  The ASCVD Risk score (Arnett DK, et al., 2019) failed to calculate for the following reasons:   Cannot find a previous HDL lab   Cannot find a previous total cholesterol lab   Social: Driving: yes  Alcohol Use: no  Tobacco Use: no  Other Drugs: no  Independence: yes Depression: no   - PHQ9 score: 0 Family Support and Life at Home: yes, has family in the area Community support: yes Spiritual beliefs: yes, Christian Advanced Directives: no   SDOH Assessment: Safety/violence/abuse issues: no Surveyor, quantity strain: fixed  income Employment: no, retired [employer] Education level: Able to read and write: yes Healthcare literacy: some Physical Activity: yes, walking  Cancer:  Colorectal >> Colonoscopy: no, due for cologuard test  Lung >> Tobacco Use: no  Breast >> Mammogram: yes, due now, follow up diagnostic mammogram ordered Cervical/Endometrial >>  - Postmenopausal: yes - Hysterectomy: yes  - Vaginal Bleeding: no - Pap Smear: no, hysterectomy and over age 44.  Skin >> Suspicious lesions: no   Other: Osteoporosis: no, patient has been getting BMD scans at Dr. Gregery Na office, need to request the records Zoster Vaccine: yes, has had the 2 dose series in 2022 Flu Vaccine: yes, will get the flu vaccine at her next office visit or at her pharmacy  Pneumonia Vaccine: yes, received Prevar 20 vaccine in September 2022      09/13/2022    2:11 PM 09/07/2021    2:17 PM 09/04/2020    9:07 AM  MMSE - Mini Mental State Exam  Orientation to time 5 5 5   Orientation to Place 5 5 5   Registration 3 3 3   Attention/ Calculation 5 5 5   Recall 3 3 3   Language- name 2 objects 2 2 2   Language- repeat 1 1 1   Language- follow 3 step command 3 3 3   Language- read & follow direction 1 1 1   Write a sentence 1 1 1   Copy design 1 1 1   Total score 30 30 30     Functional Status Survey: Is the patient deaf or have difficulty hearing?: No Does the patient have difficulty seeing, even when wearing glasses/contacts?: No Does the patient have difficulty concentrating, remembering, or making decisions?: No Does the patient have difficulty walking or climbing  stairs?: Yes Does the patient have difficulty dressing or bathing?: No Does the patient have difficulty doing errands alone such as visiting a doctor's office or shopping?: No      06/04/2021   10:19 AM 09/07/2021    2:19 PM 10/06/2021    2:44 PM 04/08/2022    2:45 PM 09/13/2022    2:10 PM  Fall Risk  Falls in the past year? 0 0 0 0 0  Was there an injury with Fall?    0 0 0  Fall Risk Category Calculator   0 0 0  Fall Risk Category (Retired)   Low    (RETIRED) Patient Fall Risk Level  Low fall risk Low fall risk    Patient at Risk for Falls Due to  No Fall Risks No Fall Risks No Fall Risks No Fall Risks  Fall risk Follow up  Falls evaluation completed Falls evaluation completed Falls evaluation completed Falls evaluation completed        09/13/2022    2:10 PM  Depression screen PHQ 2/9  Decreased Interest 0  Down, Depressed, Hopeless 0  PHQ - 2 Score 0      Review of Systems  Constitutional: Negative.   HENT: Negative.    Eyes:  Negative for pain.  Respiratory: Negative.  Negative for cough, shortness of breath and wheezing.   Cardiovascular: Negative.  Negative for chest pain, palpitations, claudication, leg swelling and PND.  Gastrointestinal: Negative.   Genitourinary: Negative.  Negative for flank pain, frequency, hematuria and urgency.  Musculoskeletal: Negative.  Negative for back pain, myalgias and neck pain.  Skin: Negative.  Negative for itching and rash.  Neurological: Negative.  Negative for headaches.  Psychiatric/Behavioral: Negative.  Negative for memory loss. The patient is not nervous/anxious and does not have insomnia.      Past Medical History Patient Active Problem List   Diagnosis Date Noted   Chronic heart failure with preserved ejection fraction (HCC) 06/11/2021   Thrombocytopenia (HCC) 06/11/2021   B12 deficiency 03/19/2020   Anemia in stage 3b chronic kidney disease (HCC) 03/19/2020   Acute bilateral low back pain with right-sided sciatica 11/12/2018   Closed fracture of right proximal humerus 11/12/2018   Type 2 diabetes mellitus with hyperglycemia (HCC) 10/28/2018   Flu vaccine need 10/28/2018   Pain due to onychomycosis of toenails of both feet 07/17/2018   Type 2 diabetes mellitus with vascular disease (HCC) 07/17/2018   Other fatigue 11/14/2017   Moderate episode of recurrent major depressive disorder  (HCC) 11/14/2017   Chronic allergic rhinitis 11/14/2017   Primary insomnia 11/14/2017   Uncontrolled type 2 diabetes mellitus with hyperglycemia (HCC) 09/18/2017   Acute pain of right shoulder 09/18/2017   Type 2 diabetes mellitus with chronic kidney disease (HCC) 01/19/2017   Anosmia 01/19/2017   Cervicalgia 01/19/2017   Vitamin D deficiency 01/19/2017   Hypoglycemia 01/19/2017   OSA (obstructive sleep apnea) 01/19/2017   Essential hypertension, benign 01/19/2017   Mixed hyperlipidemia 01/19/2017   Anemia, unspecified 01/19/2017   Edema 01/19/2017   SOB (shortness of breath) 01/19/2017   Gout 01/19/2017   Morbid obesity (HCC) 01/19/2017   Stage 3 chronic kidney disease (HCC) 01/19/2017   Hypercalcemia 01/19/2017   Iron deficiency anemia 01/14/2016   Acute pain of right knee 04/23/2015   Left wrist pain 08/12/2014   Status post bilateral unicompartmental knee replacement 11/13/2013   Arthritis, senescent 08/02/2013    Medications- reviewed and updated Current Outpatient Medications  Medication Sig Dispense Refill  acetaminophen (TYLENOL) 325 MG tablet Take 650 mg by mouth every 6 (six) hours as needed.     Alcohol Swabs PADS Use 1 alcohol swab with glucose meter and with injectable medication daily. 300 each 3   AMBULATORY NON FORMULARY MEDICATION Medication Name: geni kot natural veggie laxitive     atorvastatin (LIPITOR) 10 MG tablet TAKE ONE TABLET BY MOUTH ONCE DAILY 90 tablet 2   betamethasone dipropionate 0.05 % cream Apply topically 2 (two) times daily. 45 g 1   bisoprolol (ZEBETA) 5 MG tablet TAKE ONE TABLET BY MOUTH ONCE DAILY 90 tablet 1   Blood Glucose Monitoring Suppl (ONETOUCH VERIO REFLECT) w/Device KIT Use as directed for once daily Dx E11.65 1 kit 0   Cholecalciferol (VITAMIN D3) 1.25 MG (50000 UT) CAPS Take by mouth daily.     cinacalcet (SENSIPAR) 30 MG tablet      citalopram (CELEXA) 10 MG tablet TAKE ONE TABLET BY MOUTH ONCE DAILY 90 tablet 2   COMFORT EZ  PEN NEEDLES 32G X 4 MM MISC USE WITH victoza injections DAILY 100 each 5   cyanocobalamin (VITAMIN B12) 100 MCG tablet Take 100 mcg by mouth daily.     febuxostat (ULORIC) 40 MG tablet TAKE ONE TABLET BY MOUTH ONCE DAILY 90 tablet 1   ferrous sulfate 325 (65 FE) MG tablet Take 325 mg by mouth daily with breakfast.     fluticasone-salmeterol (ADVAIR) 100-50 MCG/ACT AEPB Inhale 1 puff into the lungs 2 (two) times daily. 60 each 3   folic acid (FOLVITE) 1 MG tablet TAKE ONE TABLET BY MOUTH ONCE DAILY 90 tablet 1   furosemide (LASIX) 80 MG tablet TAKE ONE TABLET BY MOUTH EVERY MORNING 90 tablet 1   Garlic 1000 MG CAPS Take by mouth.     glucose blood (ONETOUCH VERIO) test strip Use as directed once a daily 300 strip 3   hydrALAZINE (APRESOLINE) 25 MG tablet TAKE ONE TABLET BY MOUTH THREE TIMES DAILY 270 tablet 2   HYDROcodone-acetaminophen (NORCO/VICODIN) 5-325 MG tablet Take 1 tablet by mouth every 6 (six) hours as needed for moderate pain or severe pain. 12 tablet 0   JARDIANCE 10 MG TABS tablet TAKE ONE TABLET BY MOUTH ONCE DAILY 90 tablet 1   ketoconazole (NIZORAL) 2 % shampoo SHAMPOO AS DIRECTED 120 mL 3   latanoprost (XALATAN) 0.005 % ophthalmic solution latanoprost 0.005 % eye drops  INSTILL 1 DROP INTO EACH EYE IN THE EVENING     liraglutide (VICTOZA) 18 MG/3ML SOPN INJECT 1.8 MG ONCE DAILY SUBCUTANEOUSLY 270 mL 3   meloxicam (MOBIC) 7.5 MG tablet TAKE 1 TABLET BY MOUTH ONCE DAILY FOR  GOUT  AND  ARTHRITIS 90 tablet 1   mirabegron ER (MYRBETRIQ) 50 MG TB24 tablet Take 1 tablet (50 mg total) by mouth daily. 90 tablet 3   montelukast (SINGULAIR) 10 MG tablet TAKE ONE TABLET BY MOUTH EVERY EVENING 90 tablet 3   NON FORMULARY CPAP pt uses [facility] for CPAP     Omega-3 Fatty Acids (FISH OIL PO) Take by mouth.     OneTouch Delica Lancets 33G MISC Use as directed once a daily DxE11.65 300 each 1   senna (SENOKOT) 8.6 MG TABS tablet Take 1 tablet by mouth.     Sulfacetamide Sodium  (SODIUM SULFACETAMIDE) 10 % SHAM Apply topically.     tizanidine (ZANAFLEX) 2 MG capsule Take one tab po qhs for spasm 90 capsule 1   traZODone (DESYREL) 50 MG tablet Take 0.5-1  tablets (25-50 mg total) by mouth at bedtime as needed for sleep. 30 tablet 3   valACYclovir (VALTREX) 500 MG tablet TAKE ONE TABLET BY MOUTH ONCE DAILY 90 tablet 1   Vitamin D, Ergocalciferol, (DRISDOL) 1.25 MG (50000 UNIT) CAPS capsule TAKE ONE CAPSULE BY MOUTH ONCE WEEKLY ON MONDAY 12 capsule 1   No current facility-administered medications for this visit.    Objective: BP 128/60   Pulse 67   Temp 98.3 F (36.8 C)   Resp 16   Ht 5\' 3"  (1.6 m)   Wt 210 lb 12.8 oz (95.6 kg)   SpO2 97%   BMI 37.34 kg/m  Gen: NAD, alert, cooperative with exam HEENT: NCAT, EOMI, PERRL CV: RRR, good S1/S2, no murmur Resp: CTABL, no wheezes, non-labored Abd: Soft, Non Tender, Non Distended, BS present, no guarding or organomegaly Ext: No edema, warm Neuro: Alert and oriented, No gross deficits   Assessment/Plan: 1. Encounter for annual wellness visit (AWV) in Medicare patient Age-appropriate preventive screenings and vaccinations discussed, annual physical exam completed. Routine labs for health maintenance done recently. PHM updated.   2. Chronic heart failure with preserved ejection fraction (HCC) Reschedule echocardiogram. Follow up with pulm as scheduled. Will continue to monitor cardiac function, BP and labs periodically. Not currently seeing cardiology, will consider referral in the future based on patient's cardiac function and prognosis.   3. Type 2 diabetes mellitus with stage 3b chronic kidney disease, without long-term current use of insulin (HCC) Stable, A1c 6.2 today. No change in current regimen, continue medications as prescribed. Her endocrinologist retired this month, primary care will continue to monitor her diabetes.  - POCT glycosylated hemoglobin (Hb A1C)  4. 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   5. Hypertension associated with stage 3b chronic kidney disease due to type 2 diabetes mellitus (HCC) Stable BP, continue bisoprolol, furosemide and hydralazine as prescribed.   6. Anemia in stage 3b chronic kidney disease (HCC) (chronic) Has seen Dr. Smith Robert in the past for hematology. Last visit was in October 2023. Her annual office visit is scheduled for 11/01/2022.   7. Hyperlipidemia associated with type 2 diabetes mellitus (HCC) Last lipid panel was in normal range, checked in June 2024. Continue atorvastatin 10 mg daily and OTC fish oil supplement daily.   8. Obstructive sleep apnea syndrome Monitored by [facility], Dr. Freda Munro. Does well on CPAP. No further interventions.   8. Abnormal mammogram of left breast Diagnostic mammogram and ultrasound ordered  - MM 3D DIAGNOSTIC MAMMOGRAM BILATERAL BREAST; Future - [place] LIMITED ULTRASOUND INCLUDING AXILLA LEFT BREAST ; Future  9. Screening for colorectal cancer Cologuard ordered - Cologuard     Orders Placed This Encounter  Procedures   MM 3D DIAGNOSTIC MAMMOGRAM BILATERAL BREAST    Standing Status:   Future    Number of Occurrences:   1    Standing Expiration Date:   09/14/2023    Order Specific Question:   Reason for Exam (SYMPTOM  OR DIAGNOSIS REQUIRED)    Answer:   abnormal mammogram    Order Specific Question:   Preferred imaging location?    Answer:   [facility]   [place] LIMITED ULTRASOUND INCLUDING AXILLA LEFT BREAST     Standing Status:   Future    Number of Occurrences:   1    Standing Expiration Date:   09/14/2023    Order Specific Question:  Reason for Exam (SYMPTOM  OR DIAGNOSIS REQUIRED)    Answer:   abnormal mammogram of left breast    Order Specific Question:   Preferred imaging location?    Answer:   [facility]   Cologuard   POCT glycosylated hemoglobin (Hb A1C)    No orders  of the defined types were placed in this encounter.   Return follow up visit in 6 months, for  reschedule echo please, need to request records from [facility] office pl[facility] --bone density .  Total time spent:30 Minutes Time spent includes review of chart, medications, test results, and follow up plan with the patient.   [place] Controlled Substance Database was reviewed by me.  This patient was seen by Sallyanne Kuster, FNP-C in collaboration with Dr. Beverely Risen as a part of collaborative care agreement.  Bradyn Soward R. Tedd Sias, MSN, FNP-C Internal medicine/Primary Care [facility]

## 2022-10-12 DIAGNOSIS — G5601 Carpal tunnel syndrome, right upper limb: Secondary | ICD-10-CM | POA: Diagnosis not present

## 2022-10-18 DIAGNOSIS — H401122 Primary open-angle glaucoma, left eye, moderate stage: Secondary | ICD-10-CM | POA: Diagnosis not present

## 2022-10-25 ENCOUNTER — Other Ambulatory Visit: Payer: Self-pay

## 2022-10-25 ENCOUNTER — Telehealth: Payer: Self-pay

## 2022-10-25 DIAGNOSIS — E1159 Type 2 diabetes mellitus with other circulatory complications: Secondary | ICD-10-CM

## 2022-10-25 NOTE — Telephone Encounter (Signed)
Pt called that she is heavy side effects from victoza blister on her feet and as soon she stooped its gone as per alyssa advised her that made her appt for tomorrow

## 2022-10-26 ENCOUNTER — Ambulatory Visit (INDEPENDENT_AMBULATORY_CARE_PROVIDER_SITE_OTHER): Payer: HMO | Admitting: Nurse Practitioner

## 2022-10-26 ENCOUNTER — Encounter: Payer: Self-pay | Admitting: Nurse Practitioner

## 2022-10-26 VITALS — BP 138/62 | HR 60 | Temp 97.8°F | Resp 16 | Ht 63.0 in | Wt 214.2 lb

## 2022-10-26 DIAGNOSIS — L233 Allergic contact dermatitis due to drugs in contact with skin: Secondary | ICD-10-CM | POA: Diagnosis not present

## 2022-10-26 DIAGNOSIS — Z79899 Other long term (current) drug therapy: Secondary | ICD-10-CM | POA: Diagnosis not present

## 2022-10-26 DIAGNOSIS — G5601 Carpal tunnel syndrome, right upper limb: Secondary | ICD-10-CM | POA: Diagnosis not present

## 2022-10-26 DIAGNOSIS — E1159 Type 2 diabetes mellitus with other circulatory complications: Secondary | ICD-10-CM

## 2022-10-26 DIAGNOSIS — G4762 Sleep related leg cramps: Secondary | ICD-10-CM

## 2022-10-26 MED ORDER — BISOPROLOL FUMARATE 5 MG PO TABS
5.0000 mg | ORAL_TABLET | Freq: Every day | ORAL | 1 refills | Status: DC
Start: 2022-10-26 — End: 2023-03-16

## 2022-10-26 MED ORDER — FUROSEMIDE 80 MG PO TABS
80.0000 mg | ORAL_TABLET | Freq: Every morning | ORAL | 1 refills | Status: DC
Start: 2022-10-26 — End: 2023-03-16

## 2022-10-26 MED ORDER — ONETOUCH VERIO VI STRP
ORAL_STRIP | 3 refills | Status: AC
Start: 2022-10-26 — End: ?

## 2022-10-26 MED ORDER — FLUTICASONE-SALMETEROL 100-50 MCG/ACT IN AEPB
1.0000 | INHALATION_SPRAY | Freq: Two times a day (BID) | RESPIRATORY_TRACT | 3 refills | Status: DC
Start: 2022-10-26 — End: 2023-02-02

## 2022-10-26 MED ORDER — HYDRALAZINE HCL 25 MG PO TABS
ORAL_TABLET | ORAL | 2 refills | Status: DC
Start: 2022-10-26 — End: 2023-03-16

## 2022-10-26 MED ORDER — CITALOPRAM HYDROBROMIDE 10 MG PO TABS
10.0000 mg | ORAL_TABLET | Freq: Every day | ORAL | 2 refills | Status: DC
Start: 2022-10-26 — End: 2023-08-22

## 2022-10-26 MED ORDER — VALACYCLOVIR HCL 500 MG PO TABS
500.0000 mg | ORAL_TABLET | Freq: Every day | ORAL | 1 refills | Status: DC
Start: 2022-10-26 — End: 2023-03-16

## 2022-10-26 MED ORDER — VITAMIN D3 1.25 MG (50000 UT) PO CAPS: 1.0000 | ORAL_CAPSULE | ORAL | 1 refills | Status: AC

## 2022-10-26 MED ORDER — MONTELUKAST SODIUM 10 MG PO TABS
10.0000 mg | ORAL_TABLET | Freq: Every evening | ORAL | 3 refills | Status: DC
Start: 2022-10-26 — End: 2023-09-14

## 2022-10-26 MED ORDER — FEBUXOSTAT 40 MG PO TABS
40.0000 mg | ORAL_TABLET | Freq: Every day | ORAL | 1 refills | Status: DC
Start: 2022-10-26 — End: 2023-03-16

## 2022-10-26 MED ORDER — FOLIC ACID 1 MG PO TABS
1.0000 mg | ORAL_TABLET | Freq: Every day | ORAL | 1 refills | Status: DC
Start: 2022-10-26 — End: 2023-03-16

## 2022-10-26 MED ORDER — VICTOZA 18 MG/3ML ~~LOC~~ SOPN
PEN_INJECTOR | SUBCUTANEOUS | 3 refills | Status: DC
Start: 2022-10-26 — End: 2023-05-18

## 2022-10-26 MED ORDER — TIZANIDINE HCL 2 MG PO CAPS
ORAL_CAPSULE | ORAL | 1 refills | Status: DC
Start: 2022-10-26 — End: 2023-09-14

## 2022-10-26 MED ORDER — EMPAGLIFLOZIN 10 MG PO TABS
10.0000 mg | ORAL_TABLET | Freq: Every day | ORAL | 1 refills | Status: DC
Start: 2022-10-26 — End: 2023-03-16

## 2022-10-26 MED ORDER — MELOXICAM 7.5 MG PO TABS
ORAL_TABLET | ORAL | 1 refills | Status: DC
Start: 2022-10-26 — End: 2023-03-16

## 2022-10-26 MED ORDER — ATORVASTATIN CALCIUM 10 MG PO TABS
10.0000 mg | ORAL_TABLET | Freq: Every day | ORAL | 2 refills | Status: DC
Start: 2022-10-26 — End: 2023-03-16

## 2022-10-26 NOTE — Progress Notes (Addendum)
[facility] [address] [place], [place] [address]  Internal MEDICINE  Office Visit Note  Patient Name: Frances Greene  [id number]  [id number]  Date of Service: 10/26/2022  Chief Complaint  Patient presents with   Acute Visit    Allergic reaction to Victoza.      HPI Frances Greene presents for an acute sick visit for blisters  Blisters scattered started about 1 month ago, possible reaction to victoza.  Did not get blisters until her med was changed to generic and sometimes if delivered by fedex, the ice was melted and medication was too warm.  Carpal tunnel in right wrist, wants second opinion Her pharmacy closed down and she needs all medications sent to [facility] in [place].   Current Medication:  Outpatient Encounter Medications as of 10/26/2022  Medication Sig   acetaminophen (TYLENOL) 325 MG tablet Take 650 mg by mouth every 6 (six) hours as needed.   Alcohol Swabs PADS Use 1 alcohol swab with glucose meter and with injectable medication daily.   AMBULATORY NON FORMULARY MEDICATION Medication Name: geni kot natural veggie laxitive   betamethasone dipropionate 0.05 % cream Apply topically 2 (two) times daily.   Blood Glucose Monitoring Suppl (ONETOUCH VERIO REFLECT) w/Device KIT Use as directed for once daily Dx E11.65   cinacalcet (SENSIPAR) 30 MG tablet    COMFORT EZ PEN NEEDLES 32G X 4 MM MISC USE WITH victoza injections DAILY   cyanocobalamin (VITAMIN B12) 100 MCG tablet Take 100 mcg by mouth daily.   ferrous sulfate 325 (65 FE) MG tablet Take 325 mg by mouth daily with breakfast.   Garlic 1000 MG CAPS Take by mouth.   ketoconazole (NIZORAL) 2 % shampoo SHAMPOO AS DIRECTED   latanoprost (XALATAN) 0.005 % ophthalmic solution latanoprost 0.005 % eye drops  INSTILL 1 DROP INTO EACH EYE IN THE EVENING   mirabegron ER (MYRBETRIQ) 50 MG TB24 tablet Take 1 tablet (50 mg total) by mouth daily.   NON FORMULARY CPAP pt uses [facility] for CPAP   Omega-3  Fatty Acids (FISH OIL PO) Take by mouth.   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   Sulfacetamide Sodium (SODIUM SULFACETAMIDE) 10 % SHAM Apply topically.   traZODone (DESYREL) 50 MG tablet Take 0.5-1 tablets (25-50 mg total) by mouth at bedtime as needed for sleep.   [DISCONTINUED] atorvastatin (LIPITOR) 10 MG tablet TAKE ONE TABLET BY MOUTH ONCE DAILY   [DISCONTINUED] bisoprolol (ZEBETA) 5 MG tablet TAKE ONE TABLET BY MOUTH ONCE DAILY   [DISCONTINUED] Cholecalciferol (VITAMIN D3) 1.25 MG (50000 UT) CAPS Take by mouth daily.   [DISCONTINUED] citalopram (CELEXA) 10 MG tablet TAKE ONE TABLET BY MOUTH ONCE DAILY   [DISCONTINUED] febuxostat (ULORIC) 40 MG tablet TAKE ONE TABLET BY MOUTH ONCE DAILY   [DISCONTINUED] fluticasone-salmeterol (ADVAIR) 100-50 MCG/ACT AEPB Inhale 1 puff into the lungs 2 (two) times daily.   [DISCONTINUED] folic acid (FOLVITE) 1 MG tablet TAKE ONE TABLET BY MOUTH ONCE DAILY   [DISCONTINUED] furosemide (LASIX) 80 MG tablet TAKE ONE TABLET BY MOUTH EVERY MORNING   [DISCONTINUED] glucose blood (ONETOUCH VERIO) test strip Use as directed once a daily   [DISCONTINUED] hydrALAZINE (APRESOLINE) 25 MG tablet TAKE ONE TABLET BY MOUTH THREE TIMES DAILY   [DISCONTINUED] HYDROcodone-acetaminophen (NORCO/VICODIN) 5-325 MG tablet Take 1 tablet by mouth every 6 (six) hours as needed for moderate pain or severe pain.   [  DISCONTINUED] JARDIANCE 10 MG TABS tablet TAKE ONE TABLET BY MOUTH ONCE DAILY   [DISCONTINUED] liraglutide (VICTOZA) 18 MG/3ML SOPN INJECT 1.8 MG ONCE DAILY SUBCUTANEOUSLY   [DISCONTINUED] meloxicam (MOBIC) 7.5 MG tablet TAKE 1 TABLET BY MOUTH ONCE DAILY FOR  GOUT  AND  ARTHRITIS   [DISCONTINUED] montelukast (SINGULAIR) 10 MG tablet TAKE ONE TABLET BY MOUTH EVERY EVENING   [DISCONTINUED] tizanidine (ZANAFLEX) 2 MG capsule Take one tab po qhs for spasm   [DISCONTINUED] valACYclovir  (VALTREX) 500 MG tablet TAKE ONE TABLET BY MOUTH ONCE DAILY   [DISCONTINUED] Vitamin D, Ergocalciferol, (DRISDOL) 1.25 MG (50000 UNIT) CAPS capsule TAKE ONE CAPSULE BY MOUTH ONCE WEEKLY ON MONDAY   atorvastatin (LIPITOR) 10 MG tablet Take 1 tablet (10 mg total) by mouth daily.   bisoprolol (ZEBETA) 5 MG tablet Take 1 tablet (5 mg total) by mouth daily.   Cholecalciferol (VITAMIN D3) 1.25 MG (50000 UT) CAPS Take 1 capsule (1.25 mg total) by mouth once a week.   citalopram (CELEXA) 10 MG tablet Take 1 tablet (10 mg total) by mouth daily.   empagliflozin (JARDIANCE) 10 MG TABS tablet Take 1 tablet (10 mg total) by mouth daily.   febuxostat (ULORIC) 40 MG tablet Take 1 tablet (40 mg total) by mouth daily.   fluticasone-salmeterol (ADVAIR) 100-50 MCG/ACT AEPB Inhale 1 puff into the lungs 2 (two) times daily.   folic acid (FOLVITE) 1 MG tablet Take 1 tablet (1 mg total) by mouth daily.   furosemide (LASIX) 80 MG tablet Take 1 tablet (80 mg total) by mouth every morning.   glucose blood (ONETOUCH VERIO) test strip Use as directed once a daily   hydrALAZINE (APRESOLINE) 25 MG tablet TAKE ONE TABLET BY MOUTH THREE TIMES DAILY   meloxicam (MOBIC) 7.5 MG tablet TAKE 1 TABLET BY MOUTH ONCE DAILY FOR  GOUT  AND  ARTHRITIS   montelukast (SINGULAIR) 10 MG tablet Take 1 tablet (10 mg total) by mouth every evening.   tizanidine (ZANAFLEX) 2 MG capsule Take one tab po qhs for spasm   valACYclovir (VALTREX) 500 MG tablet Take 1 tablet (500 mg total) by mouth daily.   VICTOZA 18 MG/3ML SOPN INJECT 1.8 MG ONCE DAILY SUBCUTANEOUSLY   No facility-administered encounter medications on file as of 10/26/2022.      Medical History: Past Medical History:  Diagnosis Date   Arthritis    Asthma    Bronchitis    CHF (congestive heart failure) (HCC)    Chronic kidney disease    decreased kidney function   Diabetes mellitus without complication (HCC)    Gout    Heart murmur    Hypertension    Multiple allergies     Shortness of breath dyspnea    w/ exertion   Sleep apnea    CPAP   Swelling of both lower extremities    Wheezing      Vital Signs: BP 138/62   Pulse 60   Temp 97.8 F (36.6 C)   Resp 16   Ht 5\' 3"  (1.6 m)   Wt 214 lb 3.2 oz (97.2 kg)   SpO2 93%   BMI 37.94 kg/m    Review of Systems  Constitutional:  Positive for activity change and fatigue. Negative for chills and unexpected weight change.  HENT:  Negative for congestion, rhinorrhea, sneezing and sore throat.   Eyes:  Negative for redness.  Respiratory:  Negative for cough, chest tightness, shortness of breath and wheezing.   Cardiovascular: Negative.  Negative for  chest pain and palpitations.  Gastrointestinal:  Negative for abdominal pain, constipation, diarrhea, nausea and vomiting.  Genitourinary:  Negative for dysuria and frequency.  Musculoskeletal:  Positive for arthralgias (right hand, right ring finger). Negative for back pain, joint swelling and neck pain.  Skin:  Negative for rash.  Neurological:  Negative for tremors and numbness.  Hematological:  Negative for adenopathy. Does not bruise/bleed easily.  Psychiatric/Behavioral:  Negative for behavioral problems (Depression), sleep disturbance and suicidal ideas. The patient is not nervous/anxious.     Physical Exam Vitals reviewed.  Constitutional:      General: She is not in acute distress.    Appearance: Normal appearance. She is obese. She is not ill-appearing.  HENT:     Head: Normocephalic and atraumatic.  Eyes:     Pupils: Pupils are equal, round, and reactive to light.  Cardiovascular:     Rate and Rhythm: Normal rate and regular rhythm.     Pulses:          Dorsalis pedis pulses are 2+ on the right side and 2+ on the left side.       Posterior tibial pulses are 2+ on the right side and 2+ on the left side.  Pulmonary:     Effort: Pulmonary effort is normal. No respiratory distress.  Musculoskeletal:     Right foot: Decreased range of motion.  No deformity, bunion, Charcot foot, foot drop or prominent metatarsal heads.     Left foot: Decreased range of motion. No deformity, bunion, Charcot foot, foot drop or prominent metatarsal heads.  Feet:     Right foot:     Protective Sensation: 6 sites tested.  6 sites sensed.     Skin integrity: Callus and dry skin present. No ulcer, blister, skin breakdown, erythema, warmth or fissure.     Toenail Condition: Right toenails are abnormally thick. Fungal disease present.    Left foot:     Protective Sensation: 6 sites tested.  6 sites sensed.     Skin integrity: Callus and dry skin present. No ulcer, blister, skin breakdown, erythema, warmth or fissure.     Toenail Condition: Left toenails are abnormally thick. Fungal disease present. Neurological:     Mental Status: She is alert and oriented to person, place, and time.     Cranial Nerves: No cranial nerve deficit.     Coordination: Coordination normal.     Gait: Gait normal.  Psychiatric:        Mood and Affect: Mood normal.        Behavior: Behavior normal.    Diabetic Foot Exam - Simple   Simple Foot Form Diabetic Foot exam was performed with the following findings: Yes 10/26/2022  8:30 AM  Visual Inspection Sensation Testing Pulse Check Comments       Assessment/Plan: 1. Allergic contact dermatitis due to drugs in contact with skin Most likely blistering rash due to generic victoza not being stored correctly and getting too hot in the fedex van.  2. Type 2 diabetes mellitus with vascular disease (HCC) Can only have brand name victoza, no generic. Refills ordered  - VICTOZA 18 MG/3ML SOPN; INJECT 1.8 MG ONCE DAILY SUBCUTANEOUSLY  Dispense: 270 mL; Refill: 3 - glucose blood (ONETOUCH VERIO) test strip; Use as directed once a daily  Dispense: 300 strip; Refill: 3  3. Right carpal tunnel syndrome Referred to orthopedic - Ambulatory referral to Orthopedic Surgery  4. Sleep related leg cramps Continue tizanidine as  prescribed.  - tizanidine (  ZANAFLEX) 2 MG capsule; Take one tab po qhs for spasm  Dispense: 90 capsule; Refill: 1  5. Encounter for medication review Medication list reviewed, updated and refills ordered  - Cholecalciferol (VITAMIN D3) 1.25 MG (50000 UT) CAPS; Take 1 capsule (1.25 mg total) by mouth once a week.  Dispense: 12 capsule; Refill: 1 - atorvastatin (LIPITOR) 10 MG tablet; Take 1 tablet (10 mg total) by mouth daily.  Dispense: 90 tablet; Refill: 2 - folic acid (FOLVITE) 1 MG tablet; Take 1 tablet (1 mg total) by mouth daily.  Dispense: 90 tablet; Refill: 1 - hydrALAZINE (APRESOLINE) 25 MG tablet; TAKE ONE TABLET BY MOUTH THREE TIMES DAILY  Dispense: 270 tablet; Refill: 2 - meloxicam (MOBIC) 7.5 MG tablet; TAKE 1 TABLET BY MOUTH ONCE DAILY FOR  GOUT  AND  ARTHRITIS  Dispense: 90 tablet; Refill: 1 - fluticasone-salmeterol (ADVAIR) 100-50 MCG/ACT AEPB; Inhale 1 puff into the lungs 2 (two) times daily.  Dispense: 60 each; Refill: 3 - citalopram (CELEXA) 10 MG tablet; Take 1 tablet (10 mg total) by mouth daily.  Dispense: 90 tablet; Refill: 2 - empagliflozin (JARDIANCE) 10 MG TABS tablet; Take 1 tablet (10 mg total) by mouth daily.  Dispense: 90 tablet; Refill: 1 - montelukast (SINGULAIR) 10 MG tablet; Take 1 tablet (10 mg total) by mouth every evening.  Dispense: 90 tablet; Refill: 3 - febuxostat (ULORIC) 40 MG tablet; Take 1 tablet (40 mg total) by mouth daily.  Dispense: 90 tablet; Refill: 1 - bisoprolol (ZEBETA) 5 MG tablet; Take 1 tablet (5 mg total) by mouth daily.  Dispense: 90 tablet; Refill: 1 - valACYclovir (VALTREX) 500 MG tablet; Take 1 tablet (500 mg total) by mouth daily.  Dispense: 90 tablet; Refill: 1 - furosemide (LASIX) 80 MG tablet; Take 1 tablet (80 mg total) by mouth every morning.  Dispense: 90 tablet; Refill: 1   General Counseling: Fynley verbalizes understanding of the findings of todays visit and agrees with plan of treatment. I have discussed any further  diagnostic evaluation that may be needed or ordered today. We also reviewed her medications today. she has been encouraged to call the office with any questions or concerns that should arise related to todays visit.    Counseling:    Orders Placed This Encounter  Procedures   Ambulatory referral to Orthopedic Surgery    Meds ordered this encounter  Medications   VICTOZA 18 MG/3ML SOPN    Sig: INJECT 1.8 MG ONCE DAILY SUBCUTANEOUSLY    Dispense:  270 mL    Refill:  3    Vrand name only, patient had adverse effect to generic liraglutide, Fill as 90 day supply, please fill as 3-pak   Cholecalciferol (VITAMIN D3) 1.25 MG (50000 UT) CAPS    Sig: Take 1 capsule (1.25 mg total) by mouth once a week.    Dispense:  12 capsule    Refill:  1    Fill script   atorvastatin (LIPITOR) 10 MG tablet    Sig: Take 1 tablet (10 mg total) by mouth daily.    Dispense:  90 tablet    Refill:  2   folic acid (FOLVITE) 1 MG tablet    Sig: Take 1 tablet (1 mg total) by mouth daily.    Dispense:  90 tablet    Refill:  1   glucose blood (ONETOUCH VERIO) test strip    Sig: Use as directed once a daily    Dispense:  300 strip    Refill:  3  hydrALAZINE (APRESOLINE) 25 MG tablet    Sig: TAKE ONE TABLET BY MOUTH THREE TIMES DAILY    Dispense:  270 tablet    Refill:  2   meloxicam (MOBIC) 7.5 MG tablet    Sig: TAKE 1 TABLET BY MOUTH ONCE DAILY FOR  GOUT  AND  ARTHRITIS    Dispense:  90 tablet    Refill:  1   tizanidine (ZANAFLEX) 2 MG capsule    Sig: Take one tab po qhs for spasm    Dispense:  90 capsule    Refill:  1   fluticasone-salmeterol (ADVAIR) 100-50 MCG/ACT AEPB    Sig: Inhale 1 puff into the lungs 2 (two) times daily.    Dispense:  60 each    Refill:  3   citalopram (CELEXA) 10 MG tablet    Sig: Take 1 tablet (10 mg total) by mouth daily.    Dispense:  90 tablet    Refill:  2   empagliflozin (JARDIANCE) 10 MG TABS tablet    Sig: Take 1 tablet (10 mg total) by mouth daily.     Dispense:  90 tablet    Refill:  1   montelukast (SINGULAIR) 10 MG tablet    Sig: Take 1 tablet (10 mg total) by mouth every evening.    Dispense:  90 tablet    Refill:  3   febuxostat (ULORIC) 40 MG tablet    Sig: Take 1 tablet (40 mg total) by mouth daily.    Dispense:  90 tablet    Refill:  1   bisoprolol (ZEBETA) 5 MG tablet    Sig: Take 1 tablet (5 mg total) by mouth daily.    Dispense:  90 tablet    Refill:  1   valACYclovir (VALTREX) 500 MG tablet    Sig: Take 1 tablet (500 mg total) by mouth daily.    Dispense:  90 tablet    Refill:  1   furosemide (LASIX) 80 MG tablet    Sig: Take 1 tablet (80 mg total) by mouth every morning.    Dispense:  90 tablet    Refill:  1    Return if symptoms worsen or fail to improve.  [place] Controlled Substance Database was reviewed by me for overdose risk score (ORS)  Time spent:30 Minutes Time spent with patient included reviewing progress notes, labs, imaging studies, and discussing plan for follow up.   This patient was seen by Sallyanne Kuster, FNP-C in collaboration with Dr. Beverely Risen as a part of collaborative care agreement.  Ventura Leggitt R. Tedd Sias, MSN, FNP-C Internal Medicine

## 2022-10-29 ENCOUNTER — Other Ambulatory Visit: Payer: Self-pay

## 2022-10-29 DIAGNOSIS — D509 Iron deficiency anemia, unspecified: Secondary | ICD-10-CM

## 2022-11-01 ENCOUNTER — Encounter: Payer: Self-pay | Admitting: Oncology

## 2022-11-01 ENCOUNTER — Inpatient Hospital Stay: Payer: HMO

## 2022-11-01 ENCOUNTER — Inpatient Hospital Stay: Payer: HMO | Attending: Oncology | Admitting: Oncology

## 2022-11-01 DIAGNOSIS — E538 Deficiency of other specified B group vitamins: Secondary | ICD-10-CM | POA: Insufficient documentation

## 2022-11-01 DIAGNOSIS — Z87891 Personal history of nicotine dependence: Secondary | ICD-10-CM | POA: Insufficient documentation

## 2022-11-01 DIAGNOSIS — D631 Anemia in chronic kidney disease: Secondary | ICD-10-CM | POA: Insufficient documentation

## 2022-11-01 DIAGNOSIS — Z803 Family history of malignant neoplasm of breast: Secondary | ICD-10-CM | POA: Insufficient documentation

## 2022-11-01 DIAGNOSIS — N183 Chronic kidney disease, stage 3 unspecified: Secondary | ICD-10-CM | POA: Insufficient documentation

## 2022-11-03 ENCOUNTER — Telehealth: Payer: Self-pay | Admitting: Nurse Practitioner

## 2022-11-03 NOTE — Telephone Encounter (Signed)
Orthopedic Surgery referral sent via [facility] to [facility]. Notified patient. Gave pt telephone # [phone]) [phone]

## 2022-11-10 ENCOUNTER — Encounter: Payer: Self-pay | Admitting: Oncology

## 2022-11-10 ENCOUNTER — Inpatient Hospital Stay (HOSPITAL_BASED_OUTPATIENT_CLINIC_OR_DEPARTMENT_OTHER): Payer: HMO | Admitting: Oncology

## 2022-11-10 ENCOUNTER — Inpatient Hospital Stay: Payer: HMO

## 2022-11-10 VITALS — BP 144/61 | HR 73 | Temp 96.7°F | Ht 63.0 in | Wt 210.3 lb

## 2022-11-10 DIAGNOSIS — Z803 Family history of malignant neoplasm of breast: Secondary | ICD-10-CM | POA: Diagnosis not present

## 2022-11-10 DIAGNOSIS — Z87891 Personal history of nicotine dependence: Secondary | ICD-10-CM | POA: Diagnosis not present

## 2022-11-10 DIAGNOSIS — E538 Deficiency of other specified B group vitamins: Secondary | ICD-10-CM | POA: Diagnosis not present

## 2022-11-10 DIAGNOSIS — D509 Iron deficiency anemia, unspecified: Secondary | ICD-10-CM

## 2022-11-10 DIAGNOSIS — D631 Anemia in chronic kidney disease: Secondary | ICD-10-CM | POA: Diagnosis not present

## 2022-11-10 DIAGNOSIS — N183 Chronic kidney disease, stage 3 unspecified: Secondary | ICD-10-CM

## 2022-11-10 LAB — FERRITIN: Ferritin: 76 ng/mL (ref 11–307)

## 2022-11-10 LAB — CMP (CANCER CENTER ONLY)
ALT: 9 U/L (ref 0–44)
AST: 12 U/L — ABNORMAL LOW (ref 15–41)
Albumin: 3.8 g/dL (ref 3.5–5.0)
Alkaline Phosphatase: 48 U/L (ref 38–126)
Anion gap: 6 (ref 5–15)
BUN: 27 mg/dL — ABNORMAL HIGH (ref 8–23)
CO2: 26 mmol/L (ref 22–32)
Calcium: 9.7 mg/dL (ref 8.9–10.3)
Chloride: 106 mmol/L (ref 98–111)
Creatinine: 1.65 mg/dL — ABNORMAL HIGH (ref 0.44–1.00)
GFR, Estimated: 32 mL/min — ABNORMAL LOW (ref >60)
Glucose, Bld: 104 mg/dL — ABNORMAL HIGH (ref 70–99)
Potassium: 3.7 mmol/L (ref 3.5–5.1)
Sodium: 138 mmol/L (ref 135–145)
Total Bilirubin: 0.8 mg/dL (ref 0.3–1.2)
Total Protein: 6.5 g/dL (ref 6.5–8.1)

## 2022-11-10 LAB — IRON AND TIBC
Iron: 48 ug/dL (ref 28–170)
Saturation Ratios: 17 % (ref 10.4–31.8)
TIBC: 277 ug/dL (ref 250–450)
UIBC: 229 ug/dL

## 2022-11-10 LAB — CBC (CANCER CENTER ONLY)
HCT: 37.9 % (ref 36.0–46.0)
Hemoglobin: 12.6 g/dL (ref 12.0–15.0)
MCH: 29.8 pg (ref 26.0–34.0)
MCHC: 33.2 g/dL (ref 30.0–36.0)
MCV: 89.6 fL (ref 80.0–100.0)
Platelet Count: 134 10*3/uL — ABNORMAL LOW (ref 150–400)
RBC: 4.23 MIL/uL (ref 3.87–5.11)
RDW: 13 % (ref 11.5–15.5)
WBC Count: 5.3 10*3/uL (ref 4.0–10.5)
nRBC: 0 % (ref 0.0–0.2)

## 2022-11-11 ENCOUNTER — Encounter: Payer: Self-pay | Admitting: Oncology

## 2022-11-11 NOTE — Progress Notes (Signed)
Hematology/Oncology Consult note [facility]  [phone] [phone]  Patient Care Team: Sallyanne Kuster, NP as PCP - General (Nurse Practitioner) Monika Salk, [facility] (Inactive) as Pharmacist (Pharmacist)   Name of the patient: Frances Greene  [id number]  [birth year]   Date of visit: 11/11/22  Diagnosis-anemia of chronic kidney disease  Chief complaint/ Reason for visit-routine follow-up of anemia  Heme/Onc history: Patient is a 77 year old female with a past medical history significant for stage III CKD, CHF, hypertension, sleep apnea among other medical problems.  She has a history of anemia of chronic kidney disease but has not required any Retacrit yet.  Hemoglobin has remained stable around 10 since 2020.  No overt evidence of iron deficiency and she has not required any IV iron in the past.  She is on oral B12 for B12 deficiency.  She is also taking oral iron  Interval history-patient is currently doing well.  No recent hospitalizations.  Denies any blood loss in her stool or urine.  She has mild baseline fatigue which has remained stable overall.  ECOG PS- 1 Pain scale- 0   Review of systems- Review of Systems  Constitutional:  Positive for malaise/fatigue. Negative for chills, fever and weight loss.  HENT:  Negative for congestion, ear discharge and nosebleeds.   Eyes:  Negative for blurred vision.  Respiratory:  Negative for cough, hemoptysis, sputum production, shortness of breath and wheezing.   Cardiovascular:  Negative for chest pain, palpitations, orthopnea and claudication.  Gastrointestinal:  Negative for abdominal pain, blood in stool, constipation, diarrhea, heartburn, melena, nausea and vomiting.  Genitourinary:  Negative for dysuria, flank pain, frequency, hematuria and urgency.  Musculoskeletal:  Negative for back pain, joint pain and myalgias.  Skin:  Negative for rash.  Neurological:  Negative for  dizziness, tingling, focal weakness, seizures, weakness and headaches.  Endo/Heme/Allergies:  Does not bruise/bleed easily.  Psychiatric/Behavioral:  Negative for depression and suicidal ideas. The patient does not have insomnia.       Allergies  Allergen Reactions   Ramipril Cough and Other (See Comments)    Other reaction(s): Cough Other reaction(s): Cough Other reaction(s): Other (See Comments) Other reaction(s): Cough Other reaction(s): Cough Other reaction(s): Cough      Past Medical History:  Diagnosis Date   Arthritis    Asthma    Bronchitis    CHF (congestive heart failure) (HCC)    Chronic kidney disease    decreased kidney function   Diabetes mellitus without complication (HCC)    Gout    Heart murmur    Hypertension    Multiple allergies    Shortness of breath dyspnea    w/ exertion   Sleep apnea    CPAP   Swelling of both lower extremities    Wheezing      Past Surgical History:  Procedure Laterality Date   ABDOMINAL HYSTERECTOMY     BREAST BIOPSY Left 1989   neg   CATARACT EXTRACTION W/PHACO Right 05/21/2015   Procedure: CATARACT EXTRACTION PHACO AND INTRAOCULAR LENS PLACEMENT (IOC);  Surgeon: Marcelene Butte, MD;  Location: [facility] ORS;  Service: Ophthalmology;  Laterality: Right;  [place]: AP%: 10.0 CDE: 9.53   CATARACT EXTRACTION W/PHACO Left 12/31/2020   Procedure: CATARACT EXTRACTION PHACO AND INTRAOCULAR LENS PLACEMENT (IOC) LEFT DIABETIC 4.13 01:00.9;  Surgeon: Lockie Mola, MD;  Location: [facility];  Service: Ophthalmology;  Laterality: Left;   COLONOSCOPY     JOINT REPLACEMENT Bilateral    knee  PARS PLANA VITRECTOMY Right 05/21/2015   Procedure: PARS PLANA VITRECTOMY WITH 25 GAUGE;  Surgeon: Marcelene Butte, MD;  Location: [facility];  Service: Ophthalmology;  Laterality: Right;  Lot # [id number] Endo laser: Watts: 300 Pulse Duration: 200 Total Pulses:143    shoulder surgery      Social History   Socioeconomic History    Marital status: Divorced    Spouse name: Not on file   Number of children: Not on file   Years of education: Not on file   Highest education level: Not on file  Occupational History   Not on file  Tobacco Use   Smoking status: Former    Types: Cigarettes   Smokeless tobacco: Never   Tobacco comments:    4 cigarettes quit 20 years ago  Vaping Use   Vaping status: Never Used  Substance and Sexual Activity   Alcohol use: No   Drug use: Never   Sexual activity: Not on file  Other Topics Concern   Not on file  Social History Narrative   Not on file   Social Determinants of Health   Financial Resource Strain: Medium Risk (06/01/2021)   Received from [facility], [facility]   Overall Financial Resource Strain (CARDIA)    Difficulty of Paying Living Expenses: Somewhat hard  Food Insecurity: No Food Insecurity (03/11/2022)   [facility]   Hunger Vital Sign    Worried About Running Out of Food in the Last Year: Never true    Ran Out of Food in the Last Year: Never true  Transportation Needs: No Transportation Needs (03/11/2022)   [facility]   [facility] - Transportation    Lack of Transportation (Medical): No    Lack of Transportation (Non-Medical): No  Physical Activity: Not on file  Stress: Not on file  Social Connections: Not on file  Intimate Partner Violence: Not on file    Family History  Problem Relation Age of Onset   Breast cancer Cousin    Breast cancer Other    Hypertension Mother    Diabetes Mother    Hypertension Father    Diabetes Father    Alcohol abuse Father    Aneurysm Sister    Alcohol abuse Sister    Stroke Sister        died from stroke   Hepatitis Brother    Hypertension Sister        dialysis   Diabetes Sister    Alcoholism Brother    Alcoholism Brother        dies from head injury from a fall.  not an alcoholic,   Cancer Brother    Diabetes Brother     Hypertension Brother    Diabetes Brother    Cancer Brother    Hypertension Brother    Stroke Brother    Alcoholism Brother      Current Outpatient Medications:    acetaminophen (TYLENOL) 325 MG tablet, Take 650 mg by mouth every 6 (six) hours as needed., Disp: , Rfl:    Alcohol Swabs PADS, Use 1 alcohol swab with glucose meter and with injectable medication daily., Disp: 300 each, Rfl: 3   AMBULATORY NON FORMULARY MEDICATION, Medication Name: geni kot natural veggie laxitive, Disp: , Rfl:    atorvastatin (LIPITOR) 10 MG tablet, Take 1 tablet (10 mg total) by mouth daily., Disp: 90 tablet, Rfl: 2   betamethasone dipropionate 0.05 % cream, Apply topically 2 (two) times daily.,  Disp: 45 g, Rfl: 1   bisoprolol (ZEBETA) 5 MG tablet, Take 1 tablet (5 mg total) by mouth daily., Disp: 90 tablet, Rfl: 1   Blood Glucose Monitoring Suppl (ONETOUCH VERIO REFLECT) w/Device KIT, Use as directed for once daily Dx E11.65, Disp: 1 kit, Rfl: 0   Cholecalciferol (VITAMIN D3) 1.25 MG (50000 UT) CAPS, Take 1 capsule (1.25 mg total) by mouth once a week., Disp: 12 capsule, Rfl: 1   cinacalcet (SENSIPAR) 30 MG tablet, , Disp: , Rfl:    citalopram (CELEXA) 10 MG tablet, Take 1 tablet (10 mg total) by mouth daily., Disp: 90 tablet, Rfl: 2   COMFORT EZ PEN NEEDLES 32G X 4 MM MISC, USE WITH victoza injections DAILY, Disp: 100 each, Rfl: 5   cyanocobalamin (VITAMIN B12) 100 MCG tablet, Take 100 mcg by mouth daily., Disp: , Rfl:    empagliflozin (JARDIANCE) 10 MG TABS tablet, Take 1 tablet (10 mg total) by mouth daily., Disp: 90 tablet, Rfl: 1   febuxostat (ULORIC) 40 MG tablet, Take 1 tablet (40 mg total) by mouth daily., Disp: 90 tablet, Rfl: 1   ferrous sulfate 325 (65 FE) MG tablet, Take 325 mg by mouth daily with breakfast., Disp: , Rfl:    fluticasone-salmeterol (ADVAIR) 100-50 MCG/ACT AEPB, Inhale 1 puff into the lungs 2 (two) times daily., Disp: 60 each, Rfl: 3   folic acid (FOLVITE) 1 MG tablet, Take 1 tablet  (1 mg total) by mouth daily., Disp: 90 tablet, Rfl: 1   furosemide (LASIX) 80 MG tablet, Take 1 tablet (80 mg total) by mouth every morning., Disp: 90 tablet, Rfl: 1   Garlic 1000 MG CAPS, Take by mouth., Disp: , Rfl:    glucose blood (ONETOUCH VERIO) test strip, Use as directed once a daily, Disp: 300 strip, Rfl: 3   hydrALAZINE (APRESOLINE) 25 MG tablet, TAKE ONE TABLET BY MOUTH THREE TIMES DAILY, Disp: 270 tablet, Rfl: 2   ketoconazole (NIZORAL) 2 % shampoo, SHAMPOO AS DIRECTED, Disp: 120 mL, Rfl: 3   latanoprost (XALATAN) 0.005 % ophthalmic solution, latanoprost 0.005 % eye drops  INSTILL 1 DROP INTO EACH EYE IN THE EVENING, Disp: , Rfl:    meloxicam (MOBIC) 7.5 MG tablet, TAKE 1 TABLET BY MOUTH ONCE DAILY FOR  GOUT  AND  ARTHRITIS, Disp: 90 tablet, Rfl: 1   mirabegron ER (MYRBETRIQ) 50 MG TB24 tablet, Take 1 tablet (50 mg total) by mouth daily., Disp: 90 tablet, Rfl: 3   montelukast (SINGULAIR) 10 MG tablet, Take 1 tablet (10 mg total) by mouth every evening., Disp: 90 tablet, Rfl: 3   NON FORMULARY, CPAP pt uses [facility] for CPAP, Disp: , Rfl:    Omega-3 Fatty Acids (FISH OIL PO), Take by mouth., Disp: , Rfl:    OneTouch Delica Lancets 33G MISC, Use as directed once a daily DxE11.65, Disp: 300 each, Rfl: 1   senna (SENOKOT) 8.6 MG TABS tablet, Take 1 tablet by mouth., Disp: , Rfl:    Sulfacetamide Sodium (SODIUM SULFACETAMIDE) 10 % SHAM, Apply topically., Disp: , Rfl:    tizanidine (ZANAFLEX) 2 MG capsule, Take one tab po qhs for spasm, Disp: 90 capsule, Rfl: 1   traZODone (DESYREL) 50 MG tablet, Take 0.5-1 tablets (25-50 mg total) by mouth at bedtime as needed for sleep., Disp: 30 tablet, Rfl: 3   valACYclovir (VALTREX) 500 MG tablet, Take 1 tablet (500 mg total) by mouth daily., Disp: 90 tablet, Rfl: 1   VICTOZA 18 MG/3ML SOPN, INJECT 1.8  MG ONCE DAILY SUBCUTANEOUSLY, Disp: 270 mL, Rfl: 3  Physical exam:  Vitals:   11/10/22 1500  BP: (!) 144/61  Pulse: 73  Temp: (!) 96.7  F (35.9 C)  TempSrc: Tympanic  SpO2: 96%  Weight: 210 lb 4.8 oz (95.4 kg)  Height: 5\' 3"  (1.6 m)   Physical Exam Cardiovascular:     Rate and Rhythm: Normal rate and regular rhythm.     Heart sounds: Normal heart sounds.  Pulmonary:     Effort: Pulmonary effort is normal.     Breath sounds: Normal breath sounds.  Skin:    General: Skin is warm and dry.  Neurological:     Mental Status: She is alert and oriented to person, place, and time.         Latest Ref Rng & Units 11/10/2022    2:48 PM  CMP  Glucose 70 - 99 mg/dL 132   BUN 8 - 23 mg/dL 27   Creatinine 4.40 - 1.00 mg/dL 1.02   Sodium 725 - 366 mmol/L 138   Potassium 3.5 - 5.1 mmol/L 3.7   Chloride 98 - 111 mmol/L 106   CO2 22 - 32 mmol/L 26   Calcium 8.9 - 10.3 mg/dL 9.7   Total Protein 6.5 - 8.1 g/dL 6.5   Total Bilirubin 0.3 - 1.2 mg/dL 0.8   Alkaline Phos 38 - 126 U/L 48   AST 15 - 41 U/L 12   ALT 0 - 44 U/L 9       Latest Ref Rng & Units 11/10/2022    2:48 PM  CBC  WBC 4.0 - 10.5 K/uL 5.3   Hemoglobin 12.0 - 15.0 g/dL 44.0   Hematocrit 34.7 - 46.0 % 37.9   Platelets 150 - 400 K/uL 134     Assessment and plan- Patient is a 77 y.o. female here for routine follow-up ofAnemia of chronic kidney disease  Patient's hemoglobin has been stable between 12.5-13.5 over the last 1 year.  This has been better than any of her prior values.  Ferritin levels are normal at 76 with normal iron studies.  She does not require any IV iron at this time.  Given the stability of her counts patient can continue to follow-up with her primary care doctor and nephrology and can be referred to [place] in the future if questions or concerns arise   Visit Diagnosis 1. Anemia of chronic kidney failure, stage 3 (moderate) (HCC)      Dr. Owens Shark, MD, MPH [facility] [phone] 11/11/2022 8:43 AM

## 2022-11-15 DIAGNOSIS — E210 Primary hyperparathyroidism: Secondary | ICD-10-CM | POA: Diagnosis not present

## 2022-11-15 DIAGNOSIS — I10 Essential (primary) hypertension: Secondary | ICD-10-CM | POA: Diagnosis not present

## 2022-11-15 DIAGNOSIS — E1122 Type 2 diabetes mellitus with diabetic chronic kidney disease: Secondary | ICD-10-CM | POA: Diagnosis not present

## 2022-11-15 DIAGNOSIS — N184 Chronic kidney disease, stage 4 (severe): Secondary | ICD-10-CM | POA: Diagnosis not present

## 2022-11-15 DIAGNOSIS — I5032 Chronic diastolic (congestive) heart failure: Secondary | ICD-10-CM | POA: Diagnosis not present

## 2022-11-16 ENCOUNTER — Other Ambulatory Visit: Payer: Self-pay | Admitting: Orthopedic Surgery

## 2022-11-16 DIAGNOSIS — M4802 Spinal stenosis, cervical region: Secondary | ICD-10-CM

## 2022-11-18 ENCOUNTER — Telehealth: Payer: Self-pay | Admitting: Nurse Practitioner

## 2022-11-18 NOTE — Telephone Encounter (Signed)
Orthopedic appointment 11/16/2022 @ [facility]

## 2022-11-22 ENCOUNTER — Other Ambulatory Visit: Payer: Self-pay

## 2022-11-22 DIAGNOSIS — E1165 Type 2 diabetes mellitus with hyperglycemia: Secondary | ICD-10-CM

## 2022-11-22 MED ORDER — COMFORT EZ PEN NEEDLES 32G X 4 MM MISC: 5 refills | Status: DC

## 2022-11-24 ENCOUNTER — Other Ambulatory Visit: Payer: Self-pay

## 2022-11-25 ENCOUNTER — Encounter: Payer: Self-pay | Admitting: Oncology

## 2022-11-27 ENCOUNTER — Encounter: Payer: Self-pay | Admitting: Oncology

## 2022-11-27 ENCOUNTER — Ambulatory Visit
Admission: RE | Admit: 2022-11-27 | Discharge: 2022-11-27 | Disposition: A | Discharge status: Home / Self Care | Payer: PPO | Source: Ambulatory Visit | Attending: Orthopedic Surgery | Admitting: Orthopedic Surgery

## 2022-11-27 DIAGNOSIS — M4802 Spinal stenosis, cervical region: Secondary | ICD-10-CM

## 2022-11-27 DIAGNOSIS — M50222 Other cervical disc displacement at C5-C6 level: Secondary | ICD-10-CM | POA: Diagnosis not present

## 2022-11-27 DIAGNOSIS — M47812 Spondylosis without myelopathy or radiculopathy, cervical region: Secondary | ICD-10-CM | POA: Diagnosis not present

## 2022-11-27 DIAGNOSIS — M5021 Other cervical disc displacement,  high cervical region: Secondary | ICD-10-CM | POA: Diagnosis not present

## 2022-11-27 DIAGNOSIS — M50223 Other cervical disc displacement at C6-C7 level: Secondary | ICD-10-CM | POA: Diagnosis not present

## 2022-12-20 ENCOUNTER — Other Ambulatory Visit: Payer: Self-pay

## 2022-12-20 ENCOUNTER — Inpatient Hospital Stay
Admission: RE | Admit: 2022-12-20 | Discharge: 2022-12-20 | Disposition: A | Discharge status: Home / Self Care | Payer: Self-pay | Source: Ambulatory Visit | Attending: Orthopedic Surgery | Admitting: Orthopedic Surgery

## 2022-12-20 DIAGNOSIS — Z049 Encounter for examination and observation for unspecified reason: Secondary | ICD-10-CM

## 2022-12-20 NOTE — Progress Notes (Addendum)
Referring Physician:  Kennedy Bucker, MD [address] [facility] [place],  [place] [address]  Primary Physician:  Sallyanne Kuster, NP  History of Present Illness: 12/21/2022 [name] has a history of CHF, DM, HTN, OSA, and valvular heart disease, CKD stage 3b, hyperlipidemia, gout, obesity.   She has no neck pain, but has 1 year history of constant pain in both arms into her hands. She has intermittent numbness/tingling in both arms that is worse at night and with driving. No weakness in her hands. No dexterity issues. She fees like she has some balance issues when walking on uneven ground.   She is taking mobic and zanaflex.   Bowel/Bladder Dysfunction: none, she has some urinary urgency.   Conservative measures:  Physical therapy: has not participated in  Multimodal medical therapy including regular antiinflammatories: flexeril, mobic, ultram, tylenol, zanaflex Injections: has not received epidural steroid injections  Past Surgery: no prior spinal surgeries   Frances Greene has no symptoms of cervical myelopathy.  The symptoms are causing a significant impact on the patient's life.   Review of Systems:  A 10 point review of systems is negative, except for the pertinent positives and negatives detailed in the HPI.  Past Medical History: Past Medical History:  Diagnosis Date   Arthritis    Asthma    Bronchitis    CHF (congestive heart failure) (HCC)    Chronic kidney disease    decreased kidney function   Diabetes mellitus without complication (HCC)    Gout    Heart murmur    Hypertension    Multiple allergies    Shortness of breath dyspnea    w/ exertion   Sleep apnea    CPAP   Swelling of both lower extremities    Wheezing     Past Surgical History: Past Surgical History:  Procedure Laterality Date   ABDOMINAL HYSTERECTOMY     BREAST BIOPSY Left 1989   neg   CATARACT EXTRACTION W/PHACO Right 05/21/2015   Procedure:  CATARACT EXTRACTION PHACO AND INTRAOCULAR LENS PLACEMENT (IOC);  Surgeon: Marcelene Butte, MD;  Location: [facility] OR[facility];  Service: Ophthalmology;  Laterality: Right;  [place]: AP%: 10.0 CDE: 9.53   CATARACT EXTRACTION W/PHACO Left 12/31/2020   Procedure: CATARACT EXTRACTION PHACO AND INTRAOCULAR LENS PLACEMENT (IOC) LEFT DIABETIC 4.13 01:00.9;  Surgeon: Lockie Mola, MD;  Location: [facility];  Service: Ophthalmology;  Laterality: Left;   COLONOSCOPY     JOINT REPLACEMENT Bilateral    knee   PARS PLANA VITRECTOMY Right 05/21/2015   Procedure: PARS PLANA VITRECTOMY WITH 25 GAUGE;  Surgeon: Marcelene Butte, MD;  Location: [facility] OR[facility];  Service: Ophthalmology;  Laterality: Right;  Lot # [id number] Endo laser: Watts: 300 Pulse Duration: 200 Total Pulses:143    shoulder surgery      Allergies: Allergies as of 12/21/2022 - Review Complete 12/21/2022  Allergen Reaction Noted   Ramipril Cough and Other (See Comments) 04/18/2013    Medications: Outpatient Encounter Medications as of 12/21/2022  Medication Sig   acetaminophen (TYLENOL) 325 MG tablet Take 650 mg by mouth every 6 (six) hours as needed.   Alcohol Swabs PADS Use 1 alcohol swab with glucose meter and with injectable medication daily.   AMBULATORY NON FORMULARY MEDICATION Medication Name: geni kot natural veggie laxitive   atorvastatin (LIPITOR) 10 MG tablet Take 1 tablet (10 mg total) by mouth daily.   betamethasone dipropionate 0.05 % cream Apply topically 2 (two) times daily.   bisoprolol (  ZEBETA) 5 MG tablet Take 1 tablet (5 mg total) by mouth daily.   Blood Glucose Monitoring Suppl (ONETOUCH VERIO REFLECT) w/Device KIT Use as directed for once daily Dx E11.65   Cholecalciferol (VITAMIN D3) 1.25 MG (50000 UT) CAPS Take 1 capsule (1.25 mg total) by mouth once a week.   cinacalcet (SENSIPAR) 30 MG tablet    citalopram (CELEXA) 10 MG tablet Take 1 tablet (10 mg total) by mouth daily.   cyanocobalamin (VITAMIN B12) 100 MCG tablet  Take 100 mcg by mouth daily.   empagliflozin (JARDIANCE) 10 MG TABS tablet Take 1 tablet (10 mg total) by mouth daily.   febuxostat (ULORIC) 40 MG tablet Take 1 tablet (40 mg total) by mouth daily.   ferrous sulfate 325 (65 FE) MG tablet Take 325 mg by mouth daily with breakfast.   fluticasone-salmeterol (ADVAIR) 100-50 MCG/ACT AEPB Inhale 1 puff into the lungs 2 (two) times daily.   folic acid (FOLVITE) 1 MG tablet Take 1 tablet (1 mg total) by mouth daily.   furosemide (LASIX) 80 MG tablet Take 1 tablet (80 mg total) by mouth every morning.   Garlic 1000 MG CAPS Take by mouth.   glucose blood (ONETOUCH VERIO) test strip Use as directed once a daily   hydrALAZINE (APRESOLINE) 25 MG tablet TAKE ONE TABLET BY MOUTH THREE TIMES DAILY   Insulin Pen Needle (COMFORT EZ PEN NEEDLES) 32G X 4 MM MISC Use with victoza injections daily   ketoconazole (NIZORAL) 2 % shampoo SHAMPOO AS DIRECTED   latanoprost (XALATAN) 0.005 % ophthalmic solution latanoprost 0.005 % eye drops  INSTILL 1 DROP INTO EACH EYE IN THE EVENING   meloxicam (MOBIC) 7.5 MG tablet TAKE 1 TABLET BY MOUTH ONCE DAILY FOR  GOUT  AND  ARTHRITIS   mirabegron ER (MYRBETRIQ) 50 MG TB24 tablet Take 1 tablet (50 mg total) by mouth daily.   montelukast (SINGULAIR) 10 MG tablet Take 1 tablet (10 mg total) by mouth every evening.   NON FORMULARY CPAP pt uses [facility] for CPAP   Omega-3 Fatty Acids (FISH OIL PO) Take by mouth.   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   Sulfacetamide Sodium (SODIUM SULFACETAMIDE) 10 % SHAM Apply topically.   tizanidine (ZANAFLEX) 2 MG capsule Take one tab po qhs for spasm   traZODone (DESYREL) 50 MG tablet Take 0.5-1 tablets (25-50 mg total) by mouth at bedtime as needed for sleep.   valACYclovir (VALTREX) 500 MG tablet Take 1 tablet (500 mg total) by mouth daily.   VICTOZA 18 MG/3ML SOPN INJECT 1.8 MG ONCE  DAILY SUBCUTANEOUSLY   No facility-administered encounter medications on file as of 12/21/2022.    Social History: Social History   Tobacco Use   Smoking status: Former    Types: Cigarettes   Smokeless tobacco: Never   Tobacco comments:    4 cigarettes quit 20 years ago  Vaping Use   Vaping status: Never Used  Substance Use Topics   Alcohol use: No   Drug use: Never    Family Medical History: Family History  Problem Relation Age of Onset   Breast cancer Cousin    Breast cancer Other    Hypertension Mother    Diabetes Mother    Hypertension Father    Diabetes Father    Alcohol abuse Father    Aneurysm Sister    Alcohol abuse Sister  Stroke Sister        died from stroke   Hepatitis Brother    Hypertension Sister        dialysis   Diabetes Sister    Alcoholism Brother    Alcoholism Brother        dies from head injury from a fall.  not an alcoholic,   Cancer Brother    Diabetes Brother    Hypertension Brother    Diabetes Brother    Cancer Brother    Hypertension Brother    Stroke Brother    Alcoholism Brother     Physical Examination: Vitals:   12/21/22 1414  BP: 118/70    General: Patient is well developed, well nourished, calm, collected, and in no apparent distress. Attention to examination is appropriate.  Respiratory: Patient is breathing without any difficulty.   NEUROLOGICAL:     Awake, alert, oriented to person, place, and time.  Speech is clear and fluent. Fund of knowledge is appropriate.   Cranial Nerves: Pupils equal round and reactive to light.  Facial tone is symmetric.    No abnormal lesions on exposed skin.   Strength: Side Biceps Triceps Deltoid Interossei Grip Wrist Ext. Wrist Flex.  R 5 5 5 5 5 5 5   L 5 5 5 5 5 5 5    Side Iliopsoas Quads Hamstring PF DF EHL  R 5 5 5 5 5 5   L 5 5 5 5 5 5    Reflexes are 2+ and symmetric at the biceps, brachioradialis, patella and achilles.   Hoffman's is absent.  Clonus is not present.    Bilateral upper and lower extremity sensation is intact to light touch.    She has some thickening of nail on right ring finger and cannot fully extend the finger at the DIP joint.   Negative tinels at wrist and elbow bilaterally. Negative phalens at wrist bilaterally.    Limited ROM of left shoulder (history of fracture) with forward flexion and abduction to just past 90 degrees.   Good ROM of right shoulder with no pain.   She ambulates with a cane.   Medical Decision Making  Imaging: Cervical MRI dated 11/27/22:  FINDINGS: Alignment: Straightening and mild kyphotic curvature in the cervical region.   Vertebrae: No fracture or focal bone lesion. No edematous endplate marrow changes or edematous facet arthritis.   Cord: No primary cord lesion.  See below regarding stenosis.   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   Disc levels:   The foramen magnum is widely patent. There is ordinary mild osteoarthritis of the C1-2 articulation but no encroachment upon the neural structures.   C2-3: Mild bulging of the disc. Chronic facet osteoarthritis worse on the left. Mild left foraminal narrowing.   C3-4: Endplate osteophytes and bulging of the disc. Bilateral facet osteoarthritis. No compressive central canal stenosis. Bilateral foraminal narrowing, right worse than left.   C4-5: Endplate osteophytes and central disc herniation with slight caudal down turning. Some possibility of ossification of the posterior longitudinal ligament. Narrowing of the ventral subarachnoid space with indentation of cord. AP diameter of the canal in the midline 8.2 mm. Mild foraminal narrowing, left more than right.   C5-6: Endplate osteophytes and shallow protrusion of the disc. Question OPLL. Narrowing of the ventral subarachnoid space with AP diameter in the midline 8.4 mm. Bilateral foraminal  narrowing, left more than right.   C5-6: Endplate osteophytes and shallow central  disc protrusion. Narrowing of the ventral subarachnoid space but no compression of cord. AP diameter of the canal in the midline 8.4 mm. Bilateral foraminal narrowing, left more than right.   C7-T1: Shallow central disc herniation indents the ventral subarachnoid space but does not affect the cord or show foraminal extension.   T1-2: Endplate osteophytes and shallow protrusion of the disc. No compressive canal or foraminal narrowing.   T2-3: Bulging of the disc. Focal extradural abnormality in the right posterior spinal canal probably relating to ligamentous hypertrophy and calcification. Small meningioma not excluded. This does not appear to affect the cord. This is probably a significant at this time. If there is concern regarding this incompletely demonstrated finding, one could either perform thoracic CT or thoracic MRI.   IMPRESSION: 1. C2-3: Mild left foraminal narrowing due to disc bulge and facet osteoarthritis. 2. C3-4: Disc bulge and facet osteoarthritis. Bilateral foraminal narrowing, right worse than left. 3. C4-5: Endplate osteophytes and central disc herniation with slight caudal down turning. Some possibility of ossification of the posterior longitudinal ligament. Narrowing of the ventral subarachnoid space with indentation of the cord. AP diameter of the canal 8.2 mm. Mild foraminal narrowing, left more than right. 4. C5-6: Endplate osteophytes and shallow central disc protrusion. Narrowing of the ventral subarachnoid space with AP diameter of the canal 8.4 mm. Bilateral foraminal narrowing, left more than right. 5. C6-7: Endplate osteophytes and shallow central disc protrusion. Narrowing of the ventral subarachnoid space but no compression of cord. AP diameter of the canal 8.4 mm. Bilateral foraminal narrowing, left more than right. 6. T2-3: Bulging of the disc. Focal extradural  abnormality in the right posterior spinal canal probably relating to ligamentous hypertrophy and calcification. Small meningioma not excluded. This does not appear to affect the cord. This is probably insignificant at this time. If there is concern regarding this incompletely demonstrated finding, one could either perform thoracic CT or thoracic MRI.     Electronically Signed   By: Paulina Fusi M.D.   On: 12/20/2022 08:55  I have personally reviewed the images and agree with the above interpretation.   Cervical xrays dated 11/16/22:  Mild/moderate cervical spondylosis and DDD C4-C7.   Xray report not available for above xrays.   Assessment and Plan: Ms. Ardell has no neck pain, but has 1 year history of constant pain in both arms into her hands. She has intermittent numbness/tingling in both arms that is worse at night and with driving. No weakness in her hands. No dexterity issues.   She has known mild/moderate cervical spondylosis and DDD C4-C7 along with question of OPLL at C4-C6. She has multilevel foraminal stenosis C4-C7 with central stenosis at C4-C6. Note made of questionable meningioma at T2-T3 as well.   She likely has component of carpal tunnel syndrome. Had EMG done previously at [facility].   No significant signs of symptoms of cervical myelopathy.   Treatment options discussed with patient and following plan made:   - ROI to [facility] for records and EMG.  - Will review with Dr. Katrinka Blazing once I get her records and call her with further plan.   I spent a total of 25 minutes in face-to-face and non-face-to-face activities related to this patient's care today including review of outside records, review of imaging, review of symptoms, physical exam, discussion of differential diagnosis, discussion of treatment options, and documentation.   Thank you for involving me in the care of this patient.   ADDENDUM 12/31/22:  She had EMG  of right upper extremity at [facility] on  07/15/22 showing severe carpal tunnel. She did see improvement with injections. Records scanned into chart.   Reviewed with Dr. Katrinka Blazing, including her cervical imaging. Will have her follow up with him in clinic to discuss further treatment carpal tunnel syndrome.   Drake Leach PA-C Dept. of Neurosurgery

## 2022-12-21 ENCOUNTER — Encounter: Payer: Self-pay | Admitting: Orthopedic Surgery

## 2022-12-21 ENCOUNTER — Ambulatory Visit: Payer: PPO | Admitting: Orthopedic Surgery

## 2022-12-21 VITALS — BP 118/70 | Ht 63.0 in | Wt 220.0 lb

## 2022-12-21 DIAGNOSIS — R200 Anesthesia of skin: Secondary | ICD-10-CM | POA: Diagnosis not present

## 2022-12-21 DIAGNOSIS — M50321 Other cervical disc degeneration at C4-C5 level: Secondary | ICD-10-CM

## 2022-12-21 DIAGNOSIS — M50322 Other cervical disc degeneration at C5-C6 level: Secondary | ICD-10-CM

## 2022-12-21 DIAGNOSIS — M50323 Other cervical disc degeneration at C6-C7 level: Secondary | ICD-10-CM | POA: Diagnosis not present

## 2022-12-21 DIAGNOSIS — M4802 Spinal stenosis, cervical region: Secondary | ICD-10-CM | POA: Diagnosis not present

## 2022-12-21 DIAGNOSIS — M47812 Spondylosis without myelopathy or radiculopathy, cervical region: Secondary | ICD-10-CM

## 2022-12-21 DIAGNOSIS — R202 Paresthesia of skin: Secondary | ICD-10-CM

## 2022-12-21 DIAGNOSIS — M5030 Other cervical disc degeneration, unspecified cervical region: Secondary | ICD-10-CM

## 2022-12-21 NOTE — Patient Instructions (Signed)
It was so nice to see you today. Thank you so much for coming in.    You have a lot of wear and tear (arthritis) in your neck. This may be causing the pain in arms along with the numbness and tingling.   I want to get your records from [facility] to review them.   Once I have your records, I will review with Dr. Katrinka Blazing and call you to regroup with a plan. You should hear from me in the next 7-10 days.   Please do not hesitate to call if you have any questions or concerns. You can also message me in MyChart.   Drake Leach PA-C [phone]     The physicians and staff at [facility] are committed to providing excellent care. You may receive a survey asking for feedback about your experience at our office. We value you your feedback and appreciate you taking the time to to fill it out. The [facility] leadership team is also available to discuss your experience in person, feel free to contact us [phone].

## 2022-12-22 ENCOUNTER — Other Ambulatory Visit: Payer: Self-pay | Admitting: Urology

## 2022-12-22 DIAGNOSIS — N3281 Overactive bladder: Secondary | ICD-10-CM

## 2022-12-27 DIAGNOSIS — H35372 Puckering of macula, left eye: Secondary | ICD-10-CM | POA: Diagnosis not present

## 2022-12-27 DIAGNOSIS — E119 Type 2 diabetes mellitus without complications: Secondary | ICD-10-CM | POA: Diagnosis not present

## 2022-12-27 DIAGNOSIS — H401113 Primary open-angle glaucoma, right eye, severe stage: Secondary | ICD-10-CM | POA: Diagnosis not present

## 2022-12-27 DIAGNOSIS — H401122 Primary open-angle glaucoma, left eye, moderate stage: Secondary | ICD-10-CM | POA: Diagnosis not present

## 2022-12-29 ENCOUNTER — Telehealth: Payer: Self-pay

## 2022-12-29 NOTE — Telephone Encounter (Signed)
Gave AA patient's form to fill out for her diabetic shoes.

## 2022-12-31 ENCOUNTER — Telehealth: Payer: Self-pay

## 2022-12-31 NOTE — Telephone Encounter (Signed)
Faxed [facility] for diabetic shoe  

## 2023-01-05 ENCOUNTER — Ambulatory Visit (INDEPENDENT_AMBULATORY_CARE_PROVIDER_SITE_OTHER): Payer: PPO | Admitting: Neurosurgery

## 2023-01-05 ENCOUNTER — Encounter: Payer: Self-pay | Admitting: Neurosurgery

## 2023-01-05 VITALS — BP 116/54 | Ht 63.0 in | Wt 220.0 lb

## 2023-01-05 DIAGNOSIS — R29898 Other symptoms and signs involving the musculoskeletal system: Secondary | ICD-10-CM | POA: Insufficient documentation

## 2023-01-05 DIAGNOSIS — G5601 Carpal tunnel syndrome, right upper limb: Secondary | ICD-10-CM

## 2023-01-05 NOTE — Progress Notes (Signed)
Referring Physician:  Sallyanne Kuster, NP [address] [place],  [place] [address]  Primary Physician:  Sallyanne Kuster, NP  History of Present Illness: 01/05/2023 [name] is here today with a chief complaint of right sided carpal tunnel syndrome.  She has had this pain for over a year.  It wakes her up from sleep every night.  She has tried conservative measures including injections.  She says she wakes up in the middle the night having to shake out her hand.  She has noticed that she has had progressive weakness in her hand.  She also gets numbness and tingling that has been worsening and is now constant.  She states that the tingling causes significant distress in her life.  She has noticed some weakness and wasting in her hand as well.  She did get some intermittent improvement with injections, however this was short-lived.  Got a recent EMG with severe carpal tunnel syndrome diagnosis.   Review of Systems:  A 10 point review of systems is negative, except for the pertinent positives and negatives detailed in the HPI.  Past Medical History: Past Medical History:  Diagnosis Date   Arthritis    Asthma    Bronchitis    CHF (congestive heart failure) (HCC)    Chronic kidney disease    decreased kidney function   Diabetes mellitus without complication (HCC)    Gout    Heart murmur    Hypertension    Multiple allergies    Shortness of breath dyspnea    w/ exertion   Sleep apnea    CPAP   Swelling of both lower extremities    Wheezing     Past Surgical History: Past Surgical History:  Procedure Laterality Date   ABDOMINAL HYSTERECTOMY     BREAST BIOPSY Left 1989   neg   CATARACT EXTRACTION W/PHACO Right 05/21/2015   Procedure: CATARACT EXTRACTION PHACO AND INTRAOCULAR LENS PLACEMENT (IOC);  Surgeon: Marcelene Butte, MD;  Location: [facility];  Service: Ophthalmology;  Laterality: Right;  [place]: AP%: 10.0 CDE: 9.53   CATARACT EXTRACTION W/PHACO Left  12/31/2020   Procedure: CATARACT EXTRACTION PHACO AND INTRAOCULAR LENS PLACEMENT (IOC) LEFT DIABETIC 4.13 01:00.9;  Surgeon: Lockie Mola, MD;  Location: [facility];  Service: Ophthalmology;  Laterality: Left;   COLONOSCOPY     JOINT REPLACEMENT Bilateral    knee   PARS PLANA VITRECTOMY Right 05/21/2015   Procedure: PARS PLANA VITRECTOMY WITH 25 GAUGE;  Surgeon: Marcelene Butte, MD;  Location: [facility];  Service: Ophthalmology;  Laterality: Right;  Lot # [id number] Endo laser: Watts: 300 Pulse Duration: 200 Total Pulses:143    shoulder surgery      Allergies: Allergies as of 01/05/2023 - Review Complete 01/05/2023  Allergen Reaction Noted   Ramipril Cough and Other (See Comments) 04/18/2013    Medications:  Current Outpatient Medications:    acetaminophen (TYLENOL) 325 MG tablet, Take 650 mg by mouth every 6 (six) hours as needed., Disp: , Rfl:    Alcohol Swabs PADS, Use 1 alcohol swab with glucose meter and with injectable medication daily., Disp: 300 each, Rfl: 3   AMBULATORY NON FORMULARY MEDICATION, Medication Name: geni kot natural veggie laxitive, Disp: , Rfl:    atorvastatin (LIPITOR) 10 MG tablet, Take 1 tablet (10 mg total) by mouth daily., Disp: 90 tablet, Rfl: 2   betamethasone dipropionate 0.05 % cream, Apply topically 2 (two) times daily., Disp: 45 g, Rfl: 1   bisoprolol (ZEBETA) 5 MG tablet,  Take 1 tablet (5 mg total) by mouth daily., Disp: 90 tablet, Rfl: 1   Blood Glucose Monitoring Suppl (ONETOUCH VERIO REFLECT) w/Device KIT, Use as directed for once daily Dx E11.65, Disp: 1 kit, Rfl: 0   Cholecalciferol (VITAMIN D3) 1.25 MG (50000 UT) CAPS, Take 1 capsule (1.25 mg total) by mouth once a week., Disp: 12 capsule, Rfl: 1   cinacalcet (SENSIPAR) 30 MG tablet, , Disp: , Rfl:    citalopram (CELEXA) 10 MG tablet, Take 1 tablet (10 mg total) by mouth daily., Disp: 90 tablet, Rfl: 2   cyanocobalamin (VITAMIN B12) 100 MCG tablet, Take 100 mcg by mouth daily.,  Disp: , Rfl:    empagliflozin (JARDIANCE) 10 MG TABS tablet, Take 1 tablet (10 mg total) by mouth daily., Disp: 90 tablet, Rfl: 1   febuxostat (ULORIC) 40 MG tablet, Take 1 tablet (40 mg total) by mouth daily., Disp: 90 tablet, Rfl: 1   ferrous sulfate 325 (65 FE) MG tablet, Take 325 mg by mouth daily with breakfast., Disp: , Rfl:    fluticasone-salmeterol (ADVAIR) 100-50 MCG/ACT AEPB, Inhale 1 puff into the lungs 2 (two) times daily., Disp: 60 each, Rfl: 3   folic acid (FOLVITE) 1 MG tablet, Take 1 tablet (1 mg total) by mouth daily., Disp: 90 tablet, Rfl: 1   furosemide (LASIX) 80 MG tablet, Take 1 tablet (80 mg total) by mouth every morning., Disp: 90 tablet, Rfl: 1   Garlic 1000 MG CAPS, Take by mouth., Disp: , Rfl:    glucose blood (ONETOUCH VERIO) test strip, Use as directed once a daily, Disp: 300 strip, Rfl: 3   hydrALAZINE (APRESOLINE) 25 MG tablet, TAKE ONE TABLET BY MOUTH THREE TIMES DAILY, Disp: 270 tablet, Rfl: 2   Insulin Pen Needle (COMFORT EZ PEN NEEDLES) 32G X 4 MM MISC, Use with victoza injections daily, Disp: 100 each, Rfl: 5   ketoconazole (NIZORAL) 2 % shampoo, SHAMPOO AS DIRECTED, Disp: 120 mL, Rfl: 3   latanoprost (XALATAN) 0.005 % ophthalmic solution, latanoprost 0.005 % eye drops  INSTILL 1 DROP INTO EACH EYE IN THE EVENING, Disp: , Rfl:    meloxicam (MOBIC) 7.5 MG tablet, TAKE 1 TABLET BY MOUTH ONCE DAILY FOR  GOUT  AND  ARTHRITIS, Disp: 90 tablet, Rfl: 1   mirabegron ER (MYRBETRIQ) 50 MG TB24 tablet, Take 1 tablet (50 mg total) by mouth daily., Disp: 90 tablet, Rfl: 3   montelukast (SINGULAIR) 10 MG tablet, Take 1 tablet (10 mg total) by mouth every evening., Disp: 90 tablet, Rfl: 3   NON FORMULARY, CPAP pt uses [facility] for CPAP, Disp: , Rfl:    Omega-3 Fatty Acids (FISH OIL PO), Take by mouth., Disp: , Rfl:    OneTouch Delica Lancets 33G MISC, Use as directed once a daily DxE11.65, Disp: 300 each, Rfl: 1   senna (SENOKOT) 8.6 MG TABS tablet, Take 1 tablet  by mouth., Disp: , Rfl:    Sulfacetamide Sodium (SODIUM SULFACETAMIDE) 10 % SHAM, Apply topically., Disp: , Rfl:    tizanidine (ZANAFLEX) 2 MG capsule, Take one tab po qhs for spasm, Disp: 90 capsule, Rfl: 1   traZODone (DESYREL) 50 MG tablet, Take 0.5-1 tablets (25-50 mg total) by mouth at bedtime as needed for sleep., Disp: 30 tablet, Rfl: 3   valACYclovir (VALTREX) 500 MG tablet, Take 1 tablet (500 mg total) by mouth daily., Disp: 90 tablet, Rfl: 1   VICTOZA 18 MG/3ML SOPN, INJECT 1.8 MG ONCE DAILY SUBCUTANEOUSLY, Disp: 270 mL, Rfl:  3  Social History: Social History   Tobacco Use   Smoking status: Former    Types: Cigarettes   Smokeless tobacco: Never   Tobacco comments:    4 cigarettes quit 20 years ago  Vaping Use   Vaping status: Never Used  Substance Use Topics   Alcohol use: No   Drug use: Never    Family Medical History: Family History  Problem Relation Age of Onset   Breast cancer Cousin    Breast cancer Other    Hypertension Mother    Diabetes Mother    Hypertension Father    Diabetes Father    Alcohol abuse Father    Aneurysm Sister    Alcohol abuse Sister    Stroke Sister        died from stroke   Hepatitis Brother    Hypertension Sister        dialysis   Diabetes Sister    Alcoholism Brother    Alcoholism Brother        dies from head injury from a fall.  not an alcoholic,   Cancer Brother    Diabetes Brother    Hypertension Brother    Diabetes Brother    Cancer Brother    Hypertension Brother    Stroke Brother    Alcoholism Brother     Physical Examination: Vitals:   01/05/23 1357  BP: (!) 116/54    General: Patient is in no apparent distress. Attention to examination is appropriate.  Neck:   Supple.  Full range of motion.  Respiratory: Patient is breathing without any difficulty.   NEUROLOGICAL:     Awake, alert, oriented to person, place, and time.  Speech is clear and fluent.   Cranial Nerves: Pupils equal round and reactive to  light.  Facial tone is symmetric. Shoulder shrug is symmetric. Tongue protrusion is midline.  There is no pronator drift.  Motor Exam:  She has wasting of her thenar eminence, 4 out of 5 weakness in her lower muscles, persistent strength in her ulnar innervated lumbricals  Decree sensation in the median nerve distribution, splitting of the fourth digit.  Sparing of the palm.  She gets dysesthesias in her digits when compared to the contralateral side.  Gait is cane assisted   Medical Decision Making  Electrodiagnostics: She had EMG of right upper extremity at [facility] on 07/15/22 showing severe carpal tunnel. She did see improvement with injections. Records scanned into chart.   I have personally reviewed the images and electrodiagnostics and agree with the above interpretation.  Assessment and Plan: Ms. Phillipp is a pleasant 77 y.o. female with right-sided carpal tunnel syndrome.  She has had this for over a year.  She notices that this wakes her up almost every single night.  She has to shake out her hand.  She gets burning tingling dysesthesias in her right upper extremity in the median nerve distribution on physical exam she shows some thenar wasting decree sensation and painful paresthesias in her hand.  She has positive carpal compression test.  Positive Phalen's test.  Her EMG was reviewed which demonstrates electrodiagnostically severe carpal tunnel syndrome.  Given these findings and lack of improvement with conservative management we have offered a carpal tunnel decompression she would like to go forward with the procedure.  Our team will reach out to her for scheduling.. The symptoms are causing a significant impact on the patient's life. I have utilized the care everywhere function in epic to review the outside records  available from external health systems, and have personally reviewed relevant imaging and electrodiagnostic workup.    Thank you for involving me in the care of this  patient.    Lovenia Kim MD/MSCR Neurosurgery - Peripheral Nerve Surgery

## 2023-01-06 ENCOUNTER — Telehealth: Payer: Self-pay

## 2023-01-06 NOTE — Addendum Note (Signed)
Addended by: Sharlot Gowda on: 01/06/2023 10:57 AM   Modules accepted: Orders

## 2023-01-06 NOTE — Telephone Encounter (Signed)
Planned surgery: right carpal tunnel release with ultrasound guidance   Surgery date: (TBD - patient will call back after she discusses with her son) at [facility]: [address], [place], [place] [address]) - you will find out your arrival time the business day before your surgery.   Pre-op appointment at [facility] Pre-admit Testing: we will call you with a date/time for this. If you are scheduled for an in person appointment, Pre-admit Testing is located on the first floor of the [facility] building, [address], [address]. Please bring all prescriptions in the original prescription bottles to your appointment. During this appointment, they will advise you which medications you can take the morning of surgery, and which medications you will need to hold for surgery. Labs (such as blood work, EKG) may be done at your pre-op appointment. You are not required to fast for these labs. Should you need to change your pre-op appointment, please call Pre-admit testing at [phone].      Diabetes/weight loss medications:  Victoza injectable: hold 7 days prior to surgery Empagliflozin (Jardiance): hold 3 days prior to surgery    Surgical clearance: we will send a clearance form to Sallyanne Kuster, NP. They may wish to see you in their office prior to signing the clearance form. If so, they may call you to schedule an appointment.     How to contact us:  If you have any questions/concerns before or after surgery, you can reach [place] at [phone], or you can send a mychart message. We can be reached by phone or mychart 8am-4pm, Monday-Friday.  *Please note: Calls after 4pm are forwarded to a third party answering service. Mychart messages are not routinely monitored during evenings, weekends, and holidays. Please call our office to contact the answering service for urgent concerns during non-business hours.    Appointments/FMLA &  disability paperwork: Joycelyn Rua, & Flonnie Hailstone Registered Nurse/Surgery scheduler: Royston Cowper Medical Assistants: Nash Mantis Physician Assistants: Joan Flores, PA-C, Manning Charity, PA-C & Drake Leach, PA-C Surgeons: Venetia Night, MD & Ernestine Mcmurray, MD

## 2023-01-06 NOTE — Telephone Encounter (Signed)
I spoke with Ms Oyama regarding the information below. I provided her with several available dates. She would like to discuss these dates with her son. She will call me back once she has made a decision.   Once she chooses a date, I will schedule her 2 week and 6 week post op appointments and I will mail her a copy of what we discussed.

## 2023-01-10 DIAGNOSIS — E1122 Type 2 diabetes mellitus with diabetic chronic kidney disease: Secondary | ICD-10-CM | POA: Diagnosis not present

## 2023-01-20 ENCOUNTER — Encounter: Payer: Self-pay | Admitting: Oncology

## 2023-01-20 NOTE — Telephone Encounter (Signed)
 Error

## 2023-01-24 ENCOUNTER — Other Ambulatory Visit: Payer: Self-pay

## 2023-01-24 DIAGNOSIS — Z01818 Encounter for other preprocedural examination: Secondary | ICD-10-CM

## 2023-01-24 NOTE — Telephone Encounter (Signed)
 Planned surgery: right carpal tunnel release with ultrasound guidance     Surgery date: 02/15/23 at [facility]: [address], [place], [place] [address]) - you will find out your arrival time the business day before your surgery.     Pre-op appointment at [facility] Pre-admit Testing: we will call you with a date/time for this. If you are scheduled for an in person appointment, Pre-admit Testing is located on the first floor of the [facility] building, [address]. Please bring all prescriptions in the original prescription bottles to your appointment. During this appointment, they will advise you which medications you can take the morning of surgery, and which medications you will need to hold for surgery. Labs (such as blood work, EKG) may be done at your pre-op appointment. You are not required to fast for these labs. Should you need to change your pre-op appointment, please call Pre-admit testing at [phone].          Diabetes/weight loss medications:   Victoza  injectable: hold 7 days prior to surgery Empagliflozin  (Jardiance ): hold 3 days prior to surgery       Surgical clearance: we will send a clearance form to Mardy Maxin, NP. They may wish to see you in their office prior to signing the clearance form. If so, they may call you to schedule an appointment.                  How to contact us :  If you have any questions/concerns before or after surgery, you can reach us  at [phone], or you can send a mychart message. We can be reached by phone or mychart 8am-4pm, Monday-Friday.  *Please note: Calls after 4pm are forwarded to a third party answering service. Mychart messages are not routinely monitored during evenings, weekends, and holidays. Please call our office to contact the answering service for urgent concerns during non-business hours.       Appointments/FMLA & disability paperwork: Odetta Mora, & Ritta Registered Nurse/Surgery scheduler: Othelia Medical Assistants: Damien ODESSIA Sailors Physician Assistants: Lyle Decamp, PA-C, Edsel Goods, PA-C & Glade Boys, PA-C Surgeons: Reeves Daisy, MD & Penne Sharps, MD

## 2023-01-24 NOTE — Telephone Encounter (Signed)
 I spoke with Frances Greene. We discussed surgery on 02/03/23, but after discussing the fact that she would need surgery clearance from her PCP and that she would need to hold her victoza  7 days prior and jardiance  3 days prior, we felt this was too rushed. We will plan for surgery on 02/15/23.

## 2023-01-24 NOTE — Telephone Encounter (Signed)
 Patient is calling to let our office know that she would like to have her surgery done on 02/03/2023 if possible. If this date is not available she would like 02/10/2023.

## 2023-01-25 ENCOUNTER — Telehealth: Payer: Self-pay | Admitting: Nurse Practitioner

## 2023-01-25 NOTE — Telephone Encounter (Signed)
 Received surgical clearance from [facility]. Signed by Arlyss Repress. Faxed back; [phone]. Sent to be scanned-Toni

## 2023-01-25 NOTE — Telephone Encounter (Signed)
 Done

## 2023-01-26 ENCOUNTER — Telehealth: Payer: Self-pay

## 2023-01-26 NOTE — Telephone Encounter (Signed)
 Per Frances, Frances Greene called and requested to move her surgery from 02/15/23 to 02/17/23. I have notified the OR and moved both of her post op appointments accordingly.  I notified the patient of the above.  She would like her new post op appointments mailed to her. I reminded her about holding her diabetes medications prior to surgery (as listed below).   Diabetes medications:   Victoza  injectable: hold 7 days prior to surgery Empagliflozin  (Jardiance ): hold 3 days prior to surgery.

## 2023-01-26 NOTE — Telephone Encounter (Signed)
 Surgery has been reschedule. See telephone call from 01/26/23.

## 2023-01-26 NOTE — Telephone Encounter (Signed)
 She would like to reschedule her surgery to 02/17/2023 if you have availability.

## 2023-01-27 NOTE — Telephone Encounter (Signed)
 Patient notified and appointments will be mailed.

## 2023-02-02 ENCOUNTER — Other Ambulatory Visit: Payer: Self-pay

## 2023-02-02 ENCOUNTER — Encounter
Admission: RE | Admit: 2023-02-02 | Discharge: 2023-02-02 | Disposition: A | Discharge status: Home / Self Care | Payer: PPO | Source: Ambulatory Visit | Attending: Neurosurgery | Admitting: Neurosurgery

## 2023-02-02 DIAGNOSIS — F631 Pyromania: Secondary | ICD-10-CM | POA: Insufficient documentation

## 2023-02-02 DIAGNOSIS — E1159 Type 2 diabetes mellitus with other circulatory complications: Secondary | ICD-10-CM | POA: Insufficient documentation

## 2023-02-02 DIAGNOSIS — Z01810 Encounter for preprocedural cardiovascular examination: Secondary | ICD-10-CM | POA: Diagnosis not present

## 2023-02-02 DIAGNOSIS — E785 Hyperlipidemia, unspecified: Secondary | ICD-10-CM | POA: Diagnosis not present

## 2023-02-02 DIAGNOSIS — Z01818 Encounter for other preprocedural examination: Secondary | ICD-10-CM | POA: Diagnosis not present

## 2023-02-02 DIAGNOSIS — N1832 Chronic kidney disease, stage 3b: Secondary | ICD-10-CM | POA: Diagnosis not present

## 2023-02-02 DIAGNOSIS — D696 Thrombocytopenia, unspecified: Secondary | ICD-10-CM | POA: Insufficient documentation

## 2023-02-02 DIAGNOSIS — Z79899 Other long term (current) drug therapy: Secondary | ICD-10-CM

## 2023-02-02 DIAGNOSIS — E1169 Type 2 diabetes mellitus with other specified complication: Secondary | ICD-10-CM | POA: Diagnosis not present

## 2023-02-02 DIAGNOSIS — Z01812 Encounter for preprocedural laboratory examination: Secondary | ICD-10-CM

## 2023-02-02 DIAGNOSIS — I129 Hypertensive chronic kidney disease with stage 1 through stage 4 chronic kidney disease, or unspecified chronic kidney disease: Secondary | ICD-10-CM | POA: Insufficient documentation

## 2023-02-02 DIAGNOSIS — N3281 Overactive bladder: Secondary | ICD-10-CM

## 2023-02-02 DIAGNOSIS — E1122 Type 2 diabetes mellitus with diabetic chronic kidney disease: Secondary | ICD-10-CM | POA: Diagnosis not present

## 2023-02-02 HISTORY — DX: Iron deficiency anemia, unspecified: D50.9

## 2023-02-02 HISTORY — DX: Anemia in chronic kidney disease: D63.1

## 2023-02-02 HISTORY — DX: Unspecified osteoarthritis, unspecified site: M19.90

## 2023-02-02 HISTORY — DX: Chronic kidney disease, stage 3b: N18.32

## 2023-02-02 HISTORY — DX: Type 2 diabetes mellitus with diabetic chronic kidney disease: E11.22

## 2023-02-02 HISTORY — DX: Sickle-cell trait: D57.3

## 2023-02-02 HISTORY — DX: Essential (primary) hypertension: I10

## 2023-02-02 HISTORY — DX: Spinal stenosis, cervical region: M48.02

## 2023-02-02 HISTORY — DX: Chronic kidney disease, stage 4 (severe): N18.4

## 2023-02-02 HISTORY — DX: Primary hyperparathyroidism: E21.0

## 2023-02-02 HISTORY — DX: Obstructive sleep apnea (adult) (pediatric): G47.33

## 2023-02-02 LAB — BASIC METABOLIC PANEL
Anion gap: 9 (ref 5–15)
BUN: 33 mg/dL — ABNORMAL HIGH (ref 8–23)
CO2: 27 mmol/L (ref 22–32)
Calcium: 9.7 mg/dL (ref 8.9–10.3)
Chloride: 106 mmol/L (ref 98–111)
Creatinine, Ser: 1.81 mg/dL — ABNORMAL HIGH (ref 0.44–1.00)
GFR, Estimated: 28 mL/min — ABNORMAL LOW (ref >60)
Glucose, Bld: 119 mg/dL — ABNORMAL HIGH (ref 70–99)
Potassium: 3.6 mmol/L (ref 3.5–5.1)
Sodium: 142 mmol/L (ref 135–145)

## 2023-02-02 LAB — CBC
HCT: 40.7 % (ref 36.0–46.0)
Hemoglobin: 13.4 g/dL (ref 12.0–15.0)
MCH: 29.7 pg (ref 26.0–34.0)
MCHC: 32.9 g/dL (ref 30.0–36.0)
MCV: 90.2 fL (ref 80.0–100.0)
Platelets: 139 10*3/uL — ABNORMAL LOW (ref 150–400)
RBC: 4.51 MIL/uL (ref 3.87–5.11)
RDW: 13.2 % (ref 11.5–15.5)
WBC: 6.6 10*3/uL (ref 4.0–10.5)
nRBC: 0 % (ref 0.0–0.2)

## 2023-02-02 MED ORDER — FLUTICASONE-SALMETEROL 100-50 MCG/ACT IN AEPB: 1.0000 | INHALATION_SPRAY | Freq: Two times a day (BID) | RESPIRATORY_TRACT | 3 refills | Status: DC

## 2023-02-02 MED ORDER — MIRABEGRON ER 50 MG PO TB24: 50.0000 mg | ORAL_TABLET | Freq: Every day | ORAL | 3 refills | Status: DC

## 2023-02-02 NOTE — Patient Instructions (Addendum)
 Your procedure is scheduled on: Thursday, January 30 Report to the Registration Desk on the 1st floor of the [facility] To find out your arrival time, please call [phone] between 1PM - 3PM on: Wednesday, January 29 If your arrival time is 6:00 am, do not arrive before that time as the [facility] entrance doors do not open until 6:00 am.  REMEMBER: Instructions that are not followed completely may result in serious medical risk, up to and including death; or upon the discretion of your surgeon and anesthesiologist your surgery may need to be rescheduled.  Do not eat food after midnight the night before surgery.  No gum chewing or hard candies.  You may however, drink water  up to 2 hours before you are scheduled to arrive for your surgery. Do not drink anything within 2 hours of your scheduled arrival time.  One week prior to surgery:  Stop ANY OVER THE COUNTER supplements until after surgery.   You may however, continue to take Tylenol  if needed for pain up until the day of surgery.  empagliflozin  (JARDIANCE ) - hold 3 days before surgery. Last day to take is Sunday, January 26. Resume AFTER surgery.  VICTOZA  - hold 7 days before surgery. Last day to take is Wednesday, January 22. Restart the day AFTER surgery.  Continue taking all of your other prescription medications up until the day of surgery.  ON THE DAY OF SURGERY ONLY TAKE THESE MEDICATIONS WITH SIPS OF WATER :  atorvastatin  (LIPITOR)  bisoprolol  (ZEBETA )  citalopram  (CELEXA )  fluticasone -salmeterol (ADVAIR) inhaler hydrALAZINE  (APRESOLINE )  mirabegron  ER (MYRBETRIQ )   No Alcohol  for 24 hours before or after surgery.  No Smoking including e-cigarettes for 24 hours before surgery.  No chewable tobacco products for at least 6 hours before surgery.  No nicotine patches on the day of surgery.  Do not use any "recreational" drugs for at least a week (preferably 2 weeks) before your surgery.  Please be advised that  the combination of cocaine and anesthesia may have negative outcomes, up to and including death. If you test positive for cocaine, your surgery will be cancelled.  On the morning of surgery brush your teeth with toothpaste and water , you may rinse your mouth with mouthwash if you wish. Do not swallow any toothpaste or mouthwash.  Use CHG Soap as directed on instruction sheet.  Do not wear jewelry, make-up, hairpins, clips or nail polish.  For welded (permanent) jewelry: bracelets, anklets, waist bands, etc.  Please have this removed prior to surgery.  If it is not removed, there is a chance that hospital personnel will need to cut it off on the day of surgery.  Do not wear lotions, powders, or perfumes.   Do not shave body hair from the neck down 48 hours before surgery.  Contact lenses, hearing aids and dentures may not be worn into surgery.  Do not bring valuables to the hospital. [facility] is not responsible for any missing/lost belongings or valuables.   Bring your C-PAP to the hospital in case you may have to spend the night.   Notify your doctor if there is any change in your medical condition (cold, fever, infection).  Wear comfortable clothing (specific to your surgery type) to the hospital.  After surgery, you can help prevent lung complications by doing breathing exercises.  Take deep breaths and cough every 1-2 hours.   If you are being discharged the day of surgery, you will not be allowed to drive home. You will  need a responsible individual to drive you home and stay with you for 24 hours after surgery.   If you are taking public transportation, you will need to have a responsible individual with you.  Please call the Pre-admissions Testing Dept. at [phone] if you have any questions about these instructions.  Surgery Visitation Policy:  Patients having surgery or a procedure may have two visitors.  Children under the age of 73 must have an adult with them  who is not the patient.  Temporary Visitor Restrictions Due to increasing cases of flu, RSV and COVID-19: Children ages 22 and under will not be able to visit patients in [facility] hospitals under most circumstances.      Preparing for Surgery with CHLORHEXIDINE  GLUCONATE (CHG) Soap  Chlorhexidine  Gluconate (CHG) Soap  o An antiseptic cleaner that kills germs and bonds with the skin to continue killing germs even after washing  o Used for showering the night before surgery and morning of surgery  Before surgery, you can play an important role by reducing the number of germs on your skin.  CHG (Chlorhexidine  gluconate) soap is an antiseptic cleanser which kills germs and bonds with the skin to continue killing germs even after washing.  Please do not use if you have an allergy to CHG or antibacterial soaps. If your skin becomes reddened/irritated stop using the CHG.  1. Shower the NIGHT BEFORE SURGERY and the MORNING OF SURGERY with CHG soap.  2. If you choose to wash your hair, wash your hair first as usual with your normal shampoo.  3. After shampooing, rinse your hair and body thoroughly to remove the shampoo.  4. Use CHG as you would any other liquid soap. You can apply CHG directly to the skin and wash gently with a scrungie or a clean washcloth.  5. Apply the CHG soap to your body only from the neck down. Do not use on open wounds or open sores. Avoid contact with your eyes, ears, mouth, and genitals (private parts). Wash face and genitals (private parts) with your normal soap.  6. Wash thoroughly, paying special attention to the area where your surgery will be performed.  7. Thoroughly rinse your body with warm water .  8. Do not shower/wash with your normal soap after using and rinsing off the CHG soap.  9. Pat yourself dry with a clean towel.  10. Wear clean pajamas to bed the night before surgery.  12. Place clean sheets on your bed the night of your first shower  and do not sleep with pets.  13. Shower again with the CHG soap on the day of surgery prior to arriving at the hospital.  14. Do not apply any deodorants/lotions/powders.  15. Please wear clean clothes to the hospital.

## 2023-02-03 ENCOUNTER — Telehealth: Payer: Self-pay | Admitting: Nurse Practitioner

## 2023-02-03 NOTE — Telephone Encounter (Signed)
 Surgical clearance faxed back to [facility]; [phone]. Scanned-Toni

## 2023-02-04 ENCOUNTER — Encounter: Payer: Self-pay | Admitting: Oncology

## 2023-02-08 ENCOUNTER — Encounter: Payer: Self-pay | Admitting: Internal Medicine

## 2023-02-08 ENCOUNTER — Ambulatory Visit (INDEPENDENT_AMBULATORY_CARE_PROVIDER_SITE_OTHER): Payer: HMO | Admitting: Internal Medicine

## 2023-02-08 VITALS — BP 141/67 | HR 73 | Temp 97.8°F | Resp 16 | Ht 63.0 in | Wt 216.2 lb

## 2023-02-08 DIAGNOSIS — G4733 Obstructive sleep apnea (adult) (pediatric): Secondary | ICD-10-CM | POA: Diagnosis not present

## 2023-02-08 DIAGNOSIS — I5032 Chronic diastolic (congestive) heart failure: Secondary | ICD-10-CM | POA: Diagnosis not present

## 2023-02-08 NOTE — Progress Notes (Signed)
[facility] [address] [place], [place] [address]  Pulmonary Sleep Medicine   Office Visit Note  Patient Name: Frances Greene DOB: 10-10-1945 MRN [id number]  Date of Service: 02/08/2023  Complaints/HPI: She is doing well with her CPAP device. She states she feels good and the CPAP appears to help. She is rested. She had severe OSA with an AHI of 53.9 per hour. She has gained significant benefit and compliance is excellent  Office Spirometry Results:     ROS  General: (-) fever, (-) chills, (-) night sweats, (-) weakness Skin: (-) rashes, (-) itching,. Eyes: (-) visual changes, (-) redness, (-) itching. Nose and Sinuses: (-) nasal stuffiness or itchiness, (-) postnasal drip, (-) nosebleeds, (-) sinus trouble. Mouth and Throat: (-) sore throat, (-) hoarseness. Neck: (-) swollen glands, (-) enlarged thyroid, (-) neck pain. Respiratory: - cough, (-) bloody sputum, - shortness of breath, - wheezing. Cardiovascular: - ankle swelling, (-) chest pain. Lymphatic: (-) lymph node enlargement. Neurologic: (-) numbness, (-) tingling. Psychiatric: (-) anxiety, (-) depression   Current Medication: Outpatient Encounter Medications as of 02/08/2023  Medication Sig   acetaminophen (TYLENOL) 325 MG tablet Take 650 mg by mouth every 6 (six) hours as needed.   atorvastatin (LIPITOR) 10 MG tablet Take 1 tablet (10 mg total) by mouth daily.   betamethasone dipropionate 0.05 % cream Apply topically 2 (two) times daily. (Patient taking differently: Apply 1 Application topically 2 (two) times daily as needed.)   bisoprolol (ZEBETA) 5 MG tablet Take 1 tablet (5 mg total) by mouth daily.   Blood Glucose Monitoring Suppl (ONETOUCH VERIO REFLECT) w/Device KIT Use as directed for once daily Dx E11.65   Cholecalciferol (VITAMIN D3) 1.25 MG (50000 UT) CAPS Take 1 capsule (1.25 mg total) by mouth once a week. (Patient taking differently: Take 1 capsule by mouth once a week. Monday)    cinacalcet (SENSIPAR) 30 MG tablet 30 mg daily with breakfast.   citalopram (CELEXA) 10 MG tablet Take 1 tablet (10 mg total) by mouth daily.   cyanocobalamin (VITAMIN B12) 100 MCG tablet Take 100 mcg by mouth daily.   empagliflozin (JARDIANCE) 10 MG TABS tablet Take 1 tablet (10 mg total) by mouth daily.   febuxostat (ULORIC) 40 MG tablet Take 1 tablet (40 mg total) by mouth daily.   ferrous sulfate 325 (65 FE) MG tablet Take 325 mg by mouth daily with breakfast.   fluticasone-salmeterol (ADVAIR) 100-50 MCG/ACT AEPB Inhale 1 puff into the lungs 2 (two) times daily.   folic acid (FOLVITE) 1 MG tablet Take 1 tablet (1 mg total) by mouth daily.   furosemide (LASIX) 80 MG tablet Take 1 tablet (80 mg total) by mouth every morning.   Garlic 1000 MG CAPS Take 1 capsule by mouth daily.   glucose blood (ONETOUCH VERIO) test strip Use as directed once a daily   hydrALAZINE (APRESOLINE) 25 MG tablet TAKE ONE TABLET BY MOUTH THREE TIMES DAILY   Insulin Pen Needle (COMFORT EZ PEN NEEDLES) 32G X 4 MM MISC Use with victoza injections daily   ketoconazole (NIZORAL) 2 % shampoo SHAMPOO AS DIRECTED   latanoprost (XALATAN) 0.005 % ophthalmic solution latanoprost 0.005 % eye drops  INSTILL 1 DROP INTO EACH EYE IN THE EVENING   meloxicam (MOBIC) 7.5 MG tablet TAKE 1 TABLET BY MOUTH ONCE DAILY FOR  GOUT  AND  ARTHRITIS   mirabegron ER (MYRBETRIQ) 50 MG TB24 tablet Take 1 tablet (50 mg total) by mouth daily.   montelukast (SINGULAIR)  10 MG tablet Take 1 tablet (10 mg total) by mouth every evening. (Patient taking differently: Take 10 mg by mouth daily as needed.)   NON FORMULARY CPAP pt uses [facility] for CPAP   Omega-3 Fatty Acids (FISH OIL PO) Take 1,000 mg by mouth daily.   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   tizanidine (ZANAFLEX) 2 MG capsule Take one tab po qhs for spasm (Patient taking differently:  Take 2 mg by mouth at bedtime as needed for muscle spasms. Take one tab po qhs for spasm)   traZODone (DESYREL) 50 MG tablet Take 0.5-1 tablets (25-50 mg total) by mouth at bedtime as needed for sleep.   valACYclovir (VALTREX) 500 MG tablet Take 1 tablet (500 mg total) by mouth daily.   VICTOZA 18 MG/3ML SOPN INJECT 1.8 MG ONCE DAILY SUBCUTANEOUSLY (Patient taking differently: Inject 1.8 mg into the skin at bedtime. INJECT 1.8 MG ONCE DAILY SUBCUTANEOUSLY)   No facility-administered encounter medications on file as of 02/08/2023.    Surgical History: Past Surgical History:  Procedure Laterality Date   ABDOMINAL HYSTERECTOMY     BREAST BIOPSY Left 1989   neg   CATARACT EXTRACTION W/PHACO Right 05/21/2015   Procedure: CATARACT EXTRACTION PHACO AND INTRAOCULAR LENS PLACEMENT (IOC);  Surgeon: Marcelene Butte, MD;  Location: [facility];  Service: Ophthalmology;  Laterality: Right;  [place]: AP%: 10.0 CDE: 9.53   CATARACT EXTRACTION W/PHACO Left 12/31/2020   Procedure: CATARACT EXTRACTION PHACO AND INTRAOCULAR LENS PLACEMENT (IOC) LEFT DIABETIC 4.13 01:00.9;  Surgeon: Lockie Mola, MD;  Location: [facility];  Service: Ophthalmology;  Laterality: Left;   COLONOSCOPY     MEDIAL PARTIAL KNEE REPLACEMENT Bilateral 08/13/2013   MAKOplasty   PARS PLANA VITRECTOMY Right 05/21/2015   Procedure: PARS PLANA VITRECTOMY WITH 25 GAUGE;  Surgeon: Marcelene Butte, MD;  Location: [facility] OR[facility];  Service: Ophthalmology;  Laterality: Right;  Lot # [id number] H Endo laser: Watts: 300 Pulse Duration: 200 Total Pulses:143    SHOULDER ARTHROSCOPY Right    bone spur    Medical History: Past Medical History:  Diagnosis Date   Anemia in stage 3b chronic kidney disease (HCC)    Arthritis    Asthma    Benign essential hypertension    Bronchitis    CHF (congestive heart failure) (HCC)    Chronic kidney disease, stage 4 (severe) (HCC)    decreased kidney function   Degenerative joint disease    Gout     Heart murmur    Iron deficiency anemia    Multiple allergies    Obstructive sleep apnea on CPAP    Primary hyperparathyroidism (HCC)    Shortness of breath dyspnea    w/ exertion   Sickle cell trait (HCC)    Spinal stenosis of cervical region    Swelling of both lower extremities    Type 2 diabetes mellitus with chronic kidney disease (HCC)    Wheezing     Family History: Family History  Problem Relation Age of Onset   Breast cancer Cousin    Breast cancer Other    Hypertension Mother    Diabetes Mother    Hypertension Father    Diabetes Father    Alcohol abuse Father    Aneurysm Sister    Alcohol abuse Sister    Stroke Sister        died from stroke   Hepatitis Brother  Hypertension Sister        dialysis   Diabetes Sister    Alcoholism Brother    Alcoholism Brother        dies from head injury from a fall.  not an alcoholic,   Cancer Brother    Diabetes Brother    Hypertension Brother    Diabetes Brother    Cancer Brother    Hypertension Brother    Stroke Brother    Alcoholism Brother     Social History: Social History   Socioeconomic History   Marital status: Divorced    Spouse name: Not on file   Number of children: 1   Years of education: Not on file   Highest education level: Not on file  Occupational History   Not on file  Tobacco Use   Smoking status: Former    Types: Cigarettes   Smokeless tobacco: Never   Tobacco comments:    4 cigarettes quit 20 years ago  Vaping Use   Vaping status: Never Used  Substance and Sexual Activity   Alcohol use: Not Currently   Drug use: Never   Sexual activity: Not on file  Other Topics Concern   Not on file  Social History Narrative   Lives alone   Social Drivers of Health   Financial Resource Strain: Medium Risk (06/01/2021)   [facility]   Overall Financial Resource Strain (CARDIA)    Difficulty of Paying Living Expenses: Somewhat hard  Food Insecurity: No Food  Insecurity (03/11/2022)   [facility]   Hunger Vital Sign    Worried About Running Out of Food in the Last Year: Never true    Ran Out of Food in the Last Year: Never true  Transportation Needs: No Transportation Needs (03/11/2022)   [facility]   [facility] - Transportation    Lack of Transportation (Medical): No    Lack of Transportation (Non-Medical): No  Physical Activity: Not on file  Stress: Not on file  Social Connections: Not on file  Intimate Partner Violence: Not on file    Vital Signs: Blood pressure (!) 141/67, pulse 73, temperature 97.8 F (36.6 C), resp. rate 16, height 5\' 3"  (1.6 m), weight 216 lb 3.2 oz (98.1 kg), SpO2 92%.  Examination: General Appearance: The patient is well-developed, well-nourished, and in no distress. Skin: Gross inspection of skin unremarkable. Head: normocephalic, no gross deformities. Eyes: no gross deformities noted. ENT: ears appear grossly normal no exudates. Neck: Supple. No thyromegaly. No LAD. Respiratory: no rhonchi noted. Cardiovascular: Normal S1 and S2 without murmur or rub. Extremities: No cyanosis. pulses are equal. Neurologic: Alert and oriented. No involuntary movements.  LABS: Recent Results (from the past 2160 hours)  Basic metabolic panel per protocol     Status: Abnormal   Collection Time: 02/02/23  8:15 AM  Result Value Ref Range   Sodium 142 135 - 145 mmol/L   Potassium 3.6 3.5 - 5.1 mmol/L   Chloride 106 98 - 111 mmol/L   CO2 27 22 - 32 mmol/L   Glucose, Bld 119 (H) 70 - 99 mg/dL    Comment: Glucose reference range applies only to samples taken after fasting for at least 8 hours.   BUN 33 (H) 8 - 23 mg/dL   Creatinine, Ser 7.82 (H) 0.44 - 1.00 mg/dL   Calcium 9.7 8.9 - 95.6 mg/dL   GFR, Estimated 28 (L) >60 mL/min  Comment: (NOTE) Calculated using the CKD-EPI Creatinine Equation (2021)    Anion gap 9 5 - 15    Comment: Performed at  [facility], [address] [place], [place] [address]  CBC per protocol     Status: Abnormal   Collection Time: 02/02/23  8:15 AM  Result Value Ref Range   WBC 6.6 4.0 - 10.5 K/uL   RBC 4.51 3.87 - 5.11 MIL/uL   Hemoglobin 13.4 12.0 - 15.0 g/dL   HCT 62.9 52.8 - 41.3 %   MCV 90.2 80.0 - 100.0 fL   MCH 29.7 26.0 - 34.0 pg   MCHC 32.9 30.0 - 36.0 g/dL   RDW 24.4 01.0 - 27.2 %   Platelets 139 (L) 150 - 400 K/uL   nRBC 0.0 0.0 - 0.2 %    Comment: Performed at [facility], [address] [place], [place] [address]    Radiology: No results found.  No results found.  No results found.  Assessment and Plan: Patient Active Problem List   Diagnosis Date Noted   Carpal tunnel syndrome of right wrist 01/05/2023   Weakness of right hand 01/05/2023   Sleep related leg cramps 10/26/2022   Type 2 diabetes mellitus with vascular disease (HCC) 10/26/2022   Allergic contact dermatitis due to drugs in contact with skin 10/26/2022   Hypertension associated with stage 3b chronic kidney disease due to type 2 diabetes mellitus (HCC) 10/10/2022   Chronic heart failure with preserved ejection fraction (HCC) 06/11/2021   Thrombocytopenia (HCC) 06/11/2021   B12 deficiency 03/19/2020   Anemia in stage 3b chronic kidney disease (HCC) 03/19/2020   Acute bilateral low back pain with right-sided sciatica 11/12/2018   Pain due to onychomycosis of toenails of both feet 07/17/2018   Moderate episode of recurrent major depressive disorder (HCC) 11/14/2017   Chronic allergic rhinitis 11/14/2017   Primary insomnia 11/14/2017   Type 2 diabetes mellitus with chronic kidney disease (HCC) 01/19/2017   Cervicalgia 01/19/2017   Vitamin D deficiency 01/19/2017   OSA (obstructive sleep apnea) 01/19/2017   Hyperlipidemia associated with type 2 diabetes mellitus (HCC) 01/19/2017   Edema 01/19/2017   Gout 01/19/2017   Morbid obesity (HCC) 01/19/2017   CKD stage 3 due to type 2 diabetes  mellitus (HCC) 01/19/2017   Hypercalcemia 01/19/2017   Iron deficiency anemia 01/14/2016   Status post bilateral unicompartmental knee replacement 11/13/2013   Arthritis, senescent 08/02/2013    1. OSA (obstructive sleep apnea) (Primary) On CPAP and is doing well with treatment  2. Obesity, morbid (HCC) Obesity Counseling: Had a lengthy discussion regarding patients BMI and weight issues. Patient was instructed on portion control as well as increased activity.    3. Chronic heart failure with preserved ejection fraction (HCC) Appears to be compensated   General Counseling: I have discussed the findings of the evaluation and examination with Britta Mccreedy.  I have also discussed any further diagnostic evaluation thatmay be needed or ordered today. Lagena verbalizes understanding of the findings of todays visit. We also reviewed her medications today and discussed drug interactions and side effects including but not limited excessive drowsiness and altered mental states. We also discussed that there is always a risk not just to her but also people around her. she has been encouraged to call the office with any questions or concerns that should arise related to todays visit.  No orders of the defined types were placed in this encounter.    Time spent: 45  I have  personally obtained a history, examined the patient, evaluated laboratory and imaging results, formulated the assessment and plan and placed orders.    Yevonne Pax, MD [facility] Pulmonary and Critical Care Sleep medicine

## 2023-02-08 NOTE — Patient Instructions (Signed)

## 2023-02-15 ENCOUNTER — Encounter: Payer: Self-pay | Admitting: Oncology

## 2023-02-16 MED ORDER — CEFAZOLIN SODIUM-DEXTROSE 2-4 GM/100ML-% IV SOLN
2.0000 g | INTRAVENOUS | Status: AC
  Administered 2023-02-17: 2 g via INTRAVENOUS

## 2023-02-16 MED ORDER — SODIUM CHLORIDE 0.9 % IV SOLN: INTRAVENOUS | Status: DC

## 2023-02-16 MED ORDER — ORAL CARE MOUTH RINSE: 15.0000 mL | Freq: Once | OROMUCOSAL | Status: AC

## 2023-02-16 MED ORDER — CEFAZOLIN IN SODIUM CHLORIDE 2-0.9 GM/100ML-% IV SOLN
2.0000 g | Freq: Once | INTRAVENOUS | Status: DC
  Filled 2023-02-16: qty 100

## 2023-02-16 MED ORDER — CHLORHEXIDINE GLUCONATE 0.12 % MT SOLN
15.0000 mL | Freq: Once | OROMUCOSAL | Status: AC
  Administered 2023-02-17: 15 mL via OROMUCOSAL

## 2023-02-17 ENCOUNTER — Ambulatory Visit: Payer: 59 | Admitting: Anesthesiology

## 2023-02-17 ENCOUNTER — Other Ambulatory Visit: Payer: Self-pay

## 2023-02-17 ENCOUNTER — Encounter: Payer: Self-pay | Admitting: Neurosurgery

## 2023-02-17 ENCOUNTER — Ambulatory Visit
Admission: RE | Admit: 2023-02-17 | Discharge: 2023-02-17 | Disposition: A | Discharge status: Home / Self Care | Payer: 59 | Source: Ambulatory Visit | Attending: Neurosurgery | Admitting: Neurosurgery

## 2023-02-17 ENCOUNTER — Encounter
Admission: RE | Disposition: A | Discharge status: Home / Self Care | Payer: Self-pay | Source: Ambulatory Visit | Attending: Neurosurgery

## 2023-02-17 ENCOUNTER — Encounter: Payer: Self-pay | Admitting: Oncology

## 2023-02-17 ENCOUNTER — Ambulatory Visit: Payer: 59 | Admitting: Urgent Care

## 2023-02-17 DIAGNOSIS — Z87891 Personal history of nicotine dependence: Secondary | ICD-10-CM | POA: Diagnosis not present

## 2023-02-17 DIAGNOSIS — E1122 Type 2 diabetes mellitus with diabetic chronic kidney disease: Secondary | ICD-10-CM | POA: Diagnosis not present

## 2023-02-17 DIAGNOSIS — G5601 Carpal tunnel syndrome, right upper limb: Secondary | ICD-10-CM | POA: Diagnosis not present

## 2023-02-17 DIAGNOSIS — I509 Heart failure, unspecified: Secondary | ICD-10-CM | POA: Diagnosis not present

## 2023-02-17 DIAGNOSIS — Z01812 Encounter for preprocedural laboratory examination: Secondary | ICD-10-CM

## 2023-02-17 DIAGNOSIS — D631 Anemia in chronic kidney disease: Secondary | ICD-10-CM | POA: Diagnosis not present

## 2023-02-17 DIAGNOSIS — E1159 Type 2 diabetes mellitus with other circulatory complications: Secondary | ICD-10-CM

## 2023-02-17 DIAGNOSIS — Z01810 Encounter for preprocedural cardiovascular examination: Secondary | ICD-10-CM

## 2023-02-17 DIAGNOSIS — N184 Chronic kidney disease, stage 4 (severe): Secondary | ICD-10-CM | POA: Insufficient documentation

## 2023-02-17 DIAGNOSIS — G4733 Obstructive sleep apnea (adult) (pediatric): Secondary | ICD-10-CM | POA: Diagnosis not present

## 2023-02-17 DIAGNOSIS — I130 Hypertensive heart and chronic kidney disease with heart failure and stage 1 through stage 4 chronic kidney disease, or unspecified chronic kidney disease: Secondary | ICD-10-CM | POA: Insufficient documentation

## 2023-02-17 DIAGNOSIS — D696 Thrombocytopenia, unspecified: Secondary | ICD-10-CM

## 2023-02-17 DIAGNOSIS — E1141 Type 2 diabetes mellitus with diabetic mononeuropathy: Secondary | ICD-10-CM | POA: Insufficient documentation

## 2023-02-17 DIAGNOSIS — J45909 Unspecified asthma, uncomplicated: Secondary | ICD-10-CM | POA: Insufficient documentation

## 2023-02-17 DIAGNOSIS — R29898 Other symptoms and signs involving the musculoskeletal system: Secondary | ICD-10-CM | POA: Diagnosis present

## 2023-02-17 DIAGNOSIS — E785 Hyperlipidemia, unspecified: Secondary | ICD-10-CM

## 2023-02-17 DIAGNOSIS — Z794 Long term (current) use of insulin: Secondary | ICD-10-CM | POA: Diagnosis not present

## 2023-02-17 DIAGNOSIS — I447 Left bundle-branch block, unspecified: Secondary | ICD-10-CM | POA: Insufficient documentation

## 2023-02-17 DIAGNOSIS — Z01818 Encounter for other preprocedural examination: Secondary | ICD-10-CM

## 2023-02-17 DIAGNOSIS — N183 Chronic kidney disease, stage 3 unspecified: Secondary | ICD-10-CM | POA: Diagnosis not present

## 2023-02-17 DIAGNOSIS — E1169 Type 2 diabetes mellitus with other specified complication: Secondary | ICD-10-CM

## 2023-02-17 DIAGNOSIS — I5030 Unspecified diastolic (congestive) heart failure: Secondary | ICD-10-CM | POA: Diagnosis not present

## 2023-02-17 DIAGNOSIS — I129 Hypertensive chronic kidney disease with stage 1 through stage 4 chronic kidney disease, or unspecified chronic kidney disease: Secondary | ICD-10-CM

## 2023-02-17 HISTORY — PX: CARPAL TUNNEL RELEASE: SHX101

## 2023-02-17 LAB — GLUCOSE, CAPILLARY
Glucose-Capillary: 139 mg/dL — ABNORMAL HIGH (ref 70–99)
Glucose-Capillary: 152 mg/dL — ABNORMAL HIGH (ref 70–99)

## 2023-02-17 SURGERY — CARPAL TUNNEL RELEASE
Anesthesia: General | Site: Hand | Laterality: Right

## 2023-02-17 MED ORDER — CEFAZOLIN SODIUM-DEXTROSE 2-4 GM/100ML-% IV SOLN
INTRAVENOUS | Status: AC
  Filled 2023-02-17: qty 100

## 2023-02-17 MED ORDER — STERILE WATER FOR IRRIGATION IR SOLN
Status: DC | PRN
Start: 2023-02-17 — End: 2023-02-17
  Administered 2023-02-17: 500 mL

## 2023-02-17 MED ORDER — PROPOFOL 500 MG/50ML IV EMUL
INTRAVENOUS | Status: DC | PRN
  Administered 2023-02-17: 100 ug/kg/min via INTRAVENOUS

## 2023-02-17 MED ORDER — PROPOFOL 10 MG/ML IV BOLUS
INTRAVENOUS | Status: AC
  Filled 2023-02-17: qty 20

## 2023-02-17 MED ORDER — LIDOCAINE HCL (PF) 2 % IJ SOLN
INTRAMUSCULAR | Status: DC | PRN
  Administered 2023-02-17: 40 mg via INTRADERMAL

## 2023-02-17 MED ORDER — PROPOFOL 1000 MG/100ML IV EMUL
INTRAVENOUS | Status: AC
  Filled 2023-02-17: qty 100

## 2023-02-17 MED ORDER — LIDOCAINE HCL 1 % IJ SOLN
INTRAMUSCULAR | Status: DC | PRN
  Administered 2023-02-17: 3 mL

## 2023-02-17 MED ORDER — FENTANYL CITRATE (PF) 100 MCG/2ML IJ SOLN: 25.0000 ug | INTRAMUSCULAR | Status: DC | PRN

## 2023-02-17 MED ORDER — OXYCODONE HCL 5 MG PO TABS: 5.0000 mg | ORAL_TABLET | Freq: Once | ORAL | Status: DC | PRN

## 2023-02-17 MED ORDER — FENTANYL CITRATE (PF) 100 MCG/2ML IJ SOLN
INTRAMUSCULAR | Status: AC
  Filled 2023-02-17: qty 2

## 2023-02-17 MED ORDER — MIDAZOLAM HCL 2 MG/2ML IJ SOLN
INTRAMUSCULAR | Status: AC
  Filled 2023-02-17: qty 2

## 2023-02-17 MED ORDER — LIDOCAINE HCL (PF) 2 % IJ SOLN
INTRAMUSCULAR | Status: AC
  Filled 2023-02-17: qty 5

## 2023-02-17 MED ORDER — OXYCODONE HCL 5 MG/5ML PO SOLN: 5.0000 mg | Freq: Once | ORAL | Status: DC | PRN

## 2023-02-17 MED ORDER — PROPOFOL 10 MG/ML IV BOLUS
INTRAVENOUS | Status: DC | PRN
  Administered 2023-02-17: 30 mg via INTRAVENOUS

## 2023-02-17 MED ORDER — LIDOCAINE HCL (PF) 1 % IJ SOLN
INTRAMUSCULAR | Status: AC
  Filled 2023-02-17: qty 30

## 2023-02-17 MED ORDER — HYDROCODONE-ACETAMINOPHEN 5-325 MG PO TABS: 1.0000 | ORAL_TABLET | ORAL | 0 refills | Status: AC | PRN

## 2023-02-17 MED ORDER — CHLORHEXIDINE GLUCONATE 0.12 % MT SOLN
OROMUCOSAL | Status: AC
  Filled 2023-02-17: qty 15

## 2023-02-17 MED ORDER — MIDAZOLAM HCL 2 MG/2ML IJ SOLN
INTRAMUSCULAR | Status: DC | PRN
  Administered 2023-02-17: 1 mg via INTRAVENOUS

## 2023-02-17 SURGICAL SUPPLY — 21 items
BNDG ADH 1X3 SHEER STRL LF (GAUZE/BANDAGES/DRESSINGS) ×2 IMPLANT
BNDG ADH 2 X3.75 FABRIC TAN LF (GAUZE/BANDAGES/DRESSINGS) IMPLANT
BRUSH SCRUB EZ 4% CHG (MISCELLANEOUS) ×2 IMPLANT
CHLORAPREP W/TINT 26 (MISCELLANEOUS) ×2 IMPLANT
COVER PROBE FLX POLY STRL (MISCELLANEOUS) ×2 IMPLANT
DERMABOND ADVANCED .7 DNX12 (GAUZE/BANDAGES/DRESSINGS) ×2 IMPLANT
DRAPE EXTREMITY 106X87X128.5 (DRAPES) ×2 IMPLANT
DRAPE IMP U-DRAPE 54X76 (DRAPES) ×2 IMPLANT
DRAPE SHEET LG 3/4 BI-LAMINATE (DRAPES) ×2 IMPLANT
GLOVE BIOGEL PI IND STRL 8 (GLOVE) ×2 IMPLANT
GLOVE SRG 8 PF TXTR STRL LF DI (GLOVE) ×2 IMPLANT
GLOVE SURG SYN 7.5 E (GLOVE) ×1 IMPLANT
GLOVE SURG SYN 7.5 PF PI (GLOVE) ×2 IMPLANT
GOWN SRG XL LVL 3 NONREINFORCE (GOWNS) ×2 IMPLANT
KIT TURNOVER KIT A (KITS) ×2 IMPLANT
NDL HYPO 25X1 1.5 SAFETY (NEEDLE) ×2 IMPLANT
NEEDLE HYPO 25X1 1.5 SAFETY (NEEDLE) ×1 IMPLANT
NS IRRIG 500ML POUR BTL (IV SOLUTION) ×2 IMPLANT
PACK BASIN MINOR ARMC (MISCELLANEOUS) ×2 IMPLANT
STOCKINETTE IMPERVIOUS 9X36 MD (GAUZE/BANDAGES/DRESSINGS) ×2 IMPLANT
ULTRAGLIDE CTR (BLADE) ×2 IMPLANT

## 2023-02-17 NOTE — Progress Notes (Signed)
Referring Physician:  Lovenia Kim, MD [address] [place],  [place] [address]  Primary Physician:  Frances Kuster, NP  History of Present Illness: 02/17/2023 [name] is here today with a chief complaint of right sided carpal tunnel syndrome.  She has had this pain for over a year.  It wakes her up from sleep every night.  She has tried conservative measures including injections.  She says she wakes up in the middle the night having to shake out her hand.  She has noticed that she has had progressive weakness in her hand.  She also gets numbness and tingling that has been worsening and is now constant.  She states that the tingling causes significant distress in her life.  She has noticed some weakness and wasting in her hand as well.  She did get some intermittent improvement with injections, however this was short-lived.  Got a recent EMG with severe carpal tunnel syndrome diagnosis.   Review of Systems:  A 10 point review of systems is negative, except for the pertinent positives and negatives detailed in the HPI.  Past Medical History: Past Medical History:  Diagnosis Date   Anemia in stage 3b chronic kidney disease (HCC)    Arthritis    Asthma    Benign essential hypertension    Bronchitis    CHF (congestive heart failure) (HCC)    Chronic kidney disease, stage 4 (severe) (HCC)    decreased kidney function   Degenerative joint disease    Gout    Heart murmur    Iron deficiency anemia    Multiple allergies    Obstructive sleep apnea on CPAP    Primary hyperparathyroidism (HCC)    Shortness of breath dyspnea    w/ exertion   Sickle cell trait (HCC)    Spinal stenosis of cervical region    Swelling of both lower extremities    Type 2 diabetes mellitus with chronic kidney disease (HCC)    Wheezing     Past Surgical History: Past Surgical History:  Procedure Laterality Date   ABDOMINAL HYSTERECTOMY     BREAST BIOPSY Left 1989   neg    CATARACT EXTRACTION W/PHACO Right 05/21/2015   Procedure: CATARACT EXTRACTION PHACO AND INTRAOCULAR LENS PLACEMENT (IOC);  Surgeon: Frances Butte, MD;  Location: [facility];  Service: Ophthalmology;  Laterality: Right;  [place]: AP%: 10.0 CDE: 9.53   CATARACT EXTRACTION W/PHACO Left 12/31/2020   Procedure: CATARACT EXTRACTION PHACO AND INTRAOCULAR LENS PLACEMENT (IOC) LEFT DIABETIC 4.13 01:00.9;  Surgeon: Frances Mola, MD;  Location: [facility];  Service: Ophthalmology;  Laterality: Left;   COLONOSCOPY     MEDIAL PARTIAL KNEE REPLACEMENT Bilateral 08/13/2013   MAKOplasty   PARS PLANA VITRECTOMY Right 05/21/2015   Procedure: PARS PLANA VITRECTOMY WITH 25 GAUGE;  Surgeon: Frances Butte, MD;  Location: [facility];  Service: Ophthalmology;  Laterality: Right;  Lot # [id number] H Endo laser: Watts: 300 Pulse Duration: 200 Total Pulses:143    SHOULDER ARTHROSCOPY Right    bone spur    Allergies: Allergies as of 01/24/2023 - Review Complete 01/05/2023  Allergen Reaction Noted   Ramipril Cough and Other (See Comments) 04/18/2013    Medications:  Current Facility-Administered Medications:    0.9 %  sodium chloride infusion, , Intravenous, Continuous, Frances Gubler, MD   ceFAZolin (ANCEF) IVPB 2g/100 mL premix, 2 g, Intravenous, 60 min Pre-Op, Frances Kim, MD   chlorhexidine (PERIDEX) 0.12 % solution 15 mL, 15 mL,  Mouth/Throat, Once **OR** Oral care mouth rinse, 15 mL, Mouth Rinse, Once, Frances Gubler, MD  Social History: Social History   Tobacco Use   Smoking status: Former    Types: Cigarettes   Smokeless tobacco: Never   Tobacco comments:    4 cigarettes quit 20 years ago  Vaping Use   Vaping status: Never Used  Substance Use Topics   Alcohol use: Not Currently   Drug use: Never    Family Medical History: Family History  Problem Relation Age of Onset   Breast cancer Cousin    Breast cancer Other    Hypertension Mother    Diabetes Mother    Hypertension  Father    Diabetes Father    Alcohol abuse Father    Aneurysm Sister    Alcohol abuse Sister    Stroke Sister        died from stroke   Hepatitis Brother    Hypertension Sister        dialysis   Diabetes Sister    Alcoholism Brother    Alcoholism Brother        dies from head injury from a fall.  not an alcoholic,   Cancer Brother    Diabetes Brother    Hypertension Brother    Diabetes Brother    Cancer Brother    Hypertension Brother    Stroke Brother    Alcoholism Brother     Physical Examination: Vitals:   02/17/23 0633  BP: (!) 158/62  Pulse: (!) 59  Resp: 14  Temp: (!) 97.1 F (36.2 C)  SpO2: 99%    General: Patient is in no apparent distress. Attention to examination is appropriate.  Neck:   Supple.  Full range of motion.  Respiratory: Patient is breathing without any difficulty.   NEUROLOGICAL:     Awake, alert, oriented to person, place, and time.  Speech is clear and fluent.   Cranial Nerves: Pupils equal round and reactive to light.  Facial tone is symmetric. Shoulder shrug is symmetric. Tongue protrusion is midline.  There is no pronator drift.  Motor Exam:  She has wasting of her thenar eminence, 4 out of 5 weakness in her lower muscles, persistent strength in her ulnar innervated lumbricals  Decree sensation in the median nerve distribution, splitting of the fourth digit.  Sparing of the palm.  She gets dysesthesias in her digits when compared to the contralateral side.  Gait is cane assisted   Medical Decision Making  Electrodiagnostics: She had EMG of right upper extremity at [facility] on 07/15/22 showing severe carpal tunnel. She did see improvement with injections. Records scanned into chart.   I have personally reviewed the images and electrodiagnostics and agree with the above interpretation.  Assessment and Plan: [name] is a pleasant 78 y.o. female with right-sided carpal tunnel syndrome.  She has had this for over a year.  She  notices that this wakes her up almost every single night.  She has to shake out her hand.  She gets burning tingling dysesthesias in her right upper extremity in the median nerve distribution on physical exam she shows some thenar wasting decree sensation and painful paresthesias in her hand.  She has positive carpal compression test.  Positive Phalen's test.  Her EMG was reviewed which demonstrates electrodiagnostically severe carpal tunnel syndrome.  Given these findings and lack of improvement with conservative management we have offered a carpal tunnel decompression she would like to go forward with the procedure.  Our  team will reach out to her for scheduling.. The symptoms are causing a significant impact on the patient's life. I have utilized the care everywhere function in epic to review the outside records available from external health systems, and have personally reviewed relevant imaging and electrodiagnostic workup.    Thank you for involving me in the care of this patient.    Frances Kim MD/MSCR Neurosurgery - Peripheral Nerve Surgery

## 2023-02-17 NOTE — Op Note (Signed)
Indications: Patient with a history of median neuropathy at the wrist with hand weakness refractory to conservative management.  Findings: Severe compression of the median nerve at the transverse carpal ligament  Preoperative Diagnosis: Hospital Problem List as of 02/17/2023          Priority Resolved POA     Unprioritized     Carpal tunnel syndrome of right wrist   Yes     Weakness of right hand   Yes     Postoperative Diagnosis: same   EBL: Minimal IVF: See anesthesia report Drains: none Disposition:Stable to PACU Complications: none  No foley catheter was placed.   Preoperative Note: patient with a history of progressive right median neuropathy with hand weakness refractory to conservative management.  They had tried rest, padding, and watchful waiting but had continued progressive symptoms.  Given the progression of her median neuropathy plan was made for median nerve decompression  Risk of surgery is discussed and include: Infection, bleeding, wound healing issues, pillar pain nerve injury, pain, failure to relieve the symptoms, need for further surgery.  Procedure:  1) right sided carpal tunnel decompression with ultrasound guidance   Procedure: After obtaining informed consent, the patient taken to the operating room, placed in supine position, monitored anesthesia care was induced.  They were given preoperative antibiotics.  Prepped and draped in the usual fashion.  Comprehensive timeout was performed verifying the patient's name, MRN, planned procedure.  An ultrasound was used with a sterile probe.  We used this to mark out our safety points including the interval between the ulnar artery and median nerve.  We identified the motor branch as well as the first sensory branch which were both in safe position.  We also identified the vascular arcade which was in safe positioning as well.  At this point we placed a block with Marcaine without epinephrine.  We blocked the skin  where the incision would be, the superficial sensory median nerve, as well as the transverse carpal ligament.  Under ultrasound guidance we utilized the local anesthetic to perform a hydrodissection of the nerve from the transverse carpal ligament.  We then prepped the sonex ultra CTR knife on the back table while the anesthetic set in.  We performed a small linear incision approximately 2 to 3 mm.  We then utilized a Statistician under ultrasound guidance to identify the underside of the transverse carpal ligament.  The fat and connective tissue was dissected off the underside.  We could feel that this was quite thickened and calcified.  Causing severe compression.  Once we had a clear tract we then placed the ultrasound-guided knife into the incision and advanced it through the carpal tunnel.  We verified the safety zones including the median nerve which did not significantly cross over the knife.  We are able to see the first sensory branch which was not crossing the knife.  The artery was also in a safe place.  At this point we divided the transverse carpal ligament under ultrasound guidance, to get a full release it took 2 passes.  The nerve relaxed laterally and was well decompressed.  After the 2 passes we placed a [place] 4 into the wound and were able to feel a complete dissection of the transverse carpal ligament.  We then irrigated, we got meticulous hemostasis.  Skin glue was placed on the incision and a Band-Aid was placed on top once this was dried.  No immediate complications.  Sponge and pattie counts were  correct at the end of the procedure.   I performed the procedure without an assistant surgeon  Lovenia Kim, MD/MSCR

## 2023-02-17 NOTE — Discharge Instructions (Addendum)
Your surgeon has performed an operation on the nerves in your upper extremity. Many times, patients feel better immediately after surgery and can "overdo it." Even if you feel well, it is important that you follow these activity guidelines. If you do not let your nerve heal properly from the surgery, you can increase the chance of return of your symptoms. The following are instructions to help in your recovery once you have been discharged from the hospital.  It is normal for your nerves to feel increased "tingling" after surgery or a sensation that the nerve is "waking up".  This generally will resolve in a short period of time, within 1 to 2 weeks.  * It is ok to take NSAIDs after surgery.  Activity    Increase physical activity slowly as tolerated.  Taking short walks is encouraged, but avoid strenuous exercise. Do not jog, run, bicycle, lift weights, or participate in any other exercises unless specifically allowed by your doctor. Avoid prolonged sitting, including car rides.  For the first few days after surgery, avoid any use of the extremity more than a glass of water or for eating.  Specific instructions were given to you based off of your specific procedure.  You should not drive until no longer taking any new pain medication.  You may shower three days after your surgery.  After showering, lightly dab your incision dry. Do not take a tub bath or go swimming for 3 weeks, or until approved by your doctor at your follow-up appointment.  If you smoke, we strongly recommend that you quit.  Smoking has been proven to interfere with normal healing in your back and will dramatically reduce the success rate of your surgery. Please contact [facility] (800-QUIT-NOW) and use the resources at www.[facility].com for assistance in stopping smoking.  Surgical Incision   If you have a dressing on your incision, you may remove it three days after your surgery. Keep your incision area clean and dry.  Your  sutures are under your skin and your wound is covered with surgical glue.  The glue should begin to peel away within about a week.   Diet            You may return to your usual diet. Be sure to stay hydrated.  When to Contact us  Although your surgery and recovery will likely be uneventful, you may have some residual numbness, aches, and pains in your back and/or legs. This is normal and should improve in the next few weeks.  However, should you experience any of the following, contact us immediately: New numbness or weakness Pain that is progressively getting worse, and is not relieved by your pain medications or rest Bleeding, redness, swelling, pain, or drainage from surgical incision Chills or flu-like symptoms Fever greater than 101.0 F (38.3 C) Problems with bowel or bladder functions Difficulty breathing or shortness of breath Warmth, tenderness, or swelling in your calf  Contact Information How to contact us:  If you have any questions/concerns before or after surgery, you can reach [place] at [phone], or you can send a mychart message. We can be reached by phone or mychart 8am-4pm, Monday-Friday.  *Please note: Calls after 4pm are forwarded to a third party answering service. Mychart messages are not routinely monitored during evenings, weekends, and holidays. Please call our office to contact the answering service for urgent concerns during non-business hours.

## 2023-02-17 NOTE — Anesthesia Postprocedure Evaluation (Signed)
Anesthesia Post Note  Patient: Frances Greene  Procedure(s) Performed: RIGHT CARPAL TUNNEL RELEASE WITH ULTRASOUND GUIDANCE (Right: Hand)  Patient location during evaluation: PACU Anesthesia Type: General Level of consciousness: awake and alert Pain management: pain level controlled Vital Signs Assessment: post-procedure vital signs reviewed and stable Respiratory status: spontaneous breathing, nonlabored ventilation, respiratory function stable and patient connected to nasal cannula oxygen Cardiovascular status: blood pressure returned to baseline and stable Postop Assessment: no apparent nausea or vomiting Anesthetic complications: no   No notable events documented.   Last Vitals:  Vitals:   02/17/23 0815 02/17/23 0830  BP: (!) 147/52 (!) 150/58  Pulse: 63 60  Resp: 14 20  Temp:  (!) 36.3 C  SpO2: 97% 96%    Last Pain:  Vitals:   02/17/23 0830  TempSrc:   PainSc: 0-No pain                 Louie Boston

## 2023-02-17 NOTE — Transfer of Care (Signed)
Immediate Anesthesia Transfer of Care Note  Patient: Frances Greene  Procedure(s) Performed: RIGHT CARPAL TUNNEL RELEASE WITH ULTRASOUND GUIDANCE (Right: Hand)  Patient Location: PACU  Anesthesia Type:General  Level of Consciousness: drowsy and patient cooperative  Airway & Oxygen Therapy: Patient Spontanous Breathing and Patient connected to face mask oxygen  Post-op Assessment: Report given to RN and Post -op Vital signs reviewed and stable  Post vital signs: stable  Last Vitals:  Vitals Value Taken Time  BP    Temp    Pulse    Resp    SpO2      Last Pain:  Vitals:   02/17/23 1914  TempSrc: Oral  PainSc: 0-No pain         Complications: No notable events documented.

## 2023-02-17 NOTE — H&P (View-Only) (Signed)
Referring Physician:  Lovenia Kim, MD [address] [place],  [place] [address]  Primary Physician:  Frances Kuster, NP  History of Present Illness: 02/17/2023 [name] is here today with a chief complaint of right sided carpal tunnel syndrome.  She has had this pain for over a year.  It wakes her up from sleep every night.  She has tried conservative measures including injections.  She says she wakes up in the middle the night having to shake out her hand.  She has noticed that she has had progressive weakness in her hand.  She also gets numbness and tingling that has been worsening and is now constant.  She states that the tingling causes significant distress in her life.  She has noticed some weakness and wasting in her hand as well.  She did get some intermittent improvement with injections, however this was short-lived.  Got a recent EMG with severe carpal tunnel syndrome diagnosis.   Review of Systems:  A 10 point review of systems is negative, except for the pertinent positives and negatives detailed in the HPI.  Past Medical History: Past Medical History:  Diagnosis Date   Anemia in stage 3b chronic kidney disease (HCC)    Arthritis    Asthma    Benign essential hypertension    Bronchitis    CHF (congestive heart failure) (HCC)    Chronic kidney disease, stage 4 (severe) (HCC)    decreased kidney function   Degenerative joint disease    Gout    Heart murmur    Iron deficiency anemia    Multiple allergies    Obstructive sleep apnea on CPAP    Primary hyperparathyroidism (HCC)    Shortness of breath dyspnea    w/ exertion   Sickle cell trait (HCC)    Spinal stenosis of cervical region    Swelling of both lower extremities    Type 2 diabetes mellitus with chronic kidney disease (HCC)    Wheezing     Past Surgical History: Past Surgical History:  Procedure Laterality Date   ABDOMINAL HYSTERECTOMY     BREAST BIOPSY Left 1989   neg    CATARACT EXTRACTION W/PHACO Right 05/21/2015   Procedure: CATARACT EXTRACTION PHACO AND INTRAOCULAR LENS PLACEMENT (IOC);  Surgeon: Frances Butte, MD;  Location: [facility];  Service: Ophthalmology;  Laterality: Right;  [place]: AP%: 10.0 CDE: 9.53   CATARACT EXTRACTION W/PHACO Left 12/31/2020   Procedure: CATARACT EXTRACTION PHACO AND INTRAOCULAR LENS PLACEMENT (IOC) LEFT DIABETIC 4.13 01:00.9;  Surgeon: Frances Mola, MD;  Location[facility];  Service: Ophthalmology;  Laterality: Left;   COLONOSCOPY     MEDIAL PARTIAL KNEE REPLACEMENT Bilateral 08/13/2013   MAKOplasty   PARS PLANA VITRECTOMY Right 05/21/2015   Procedure: PARS PLANA VITRECTOMY WITH 25 GAUGE;  Surgeon: Frances Butte, MD;  Location: [facility];  Service: Ophthalmology;  Laterality: Right;  Lot # [id number] H Endo laser: Watts: 300 Pulse Duration: 200 Total Pulses:143    SHOULDER ARTHROSCOPY Right    bone spur    Allergies: Allergies as of 01/24/2023 - Review Complete 01/05/2023  Allergen Reaction Noted   Ramipril Cough and Other (See Comments) 04/18/2013    Medications:  Current Facility-Administered Medications:    0.9 %  sodium chloride infusion, , Intravenous, Continuous, Frances Gubler, MD   ceFAZolin (ANCEF) IVPB 2g/100 mL premix, 2 g, Intravenous, 60 min Pre-Op, Frances Kim, MD   chlorhexidine (PERIDEX) 0.12 % solution 15 mL, 15 mL,  Mouth/Throat, Once **OR** Oral care mouth rinse, 15 mL, Mouth Rinse, Once, Frances Gubler, MD  Social History: Social History   Tobacco Use   Smoking status: Former    Types: Cigarettes   Smokeless tobacco: Never   Tobacco comments:    4 cigarettes quit 20 years ago  Vaping Use   Vaping status: Never Used  Substance Use Topics   Alcohol use: Not Currently   Drug use: Never    Family Medical History: Family History  Problem Relation Age of Onset   Breast cancer Cousin    Breast cancer Other    Hypertension Mother    Diabetes Mother    Hypertension  Father    Diabetes Father    Alcohol abuse Father    Aneurysm Sister    Alcohol abuse Sister    Stroke Sister        died from stroke   Hepatitis Brother    Hypertension Sister        dialysis   Diabetes Sister    Alcoholism Brother    Alcoholism Brother        dies from head injury from a fall.  not an alcoholic,   Cancer Brother    Diabetes Brother    Hypertension Brother    Diabetes Brother    Cancer Brother    Hypertension Brother    Stroke Brother    Alcoholism Brother     Physical Examination: Vitals:   02/17/23 0633  BP: (!) 158/62  Pulse: (!) 59  Resp: 14  Temp: (!) 97.1 F (36.2 C)  SpO2: 99%    General: Patient is in no apparent distress. Attention to examination is appropriate.  Neck:   Supple.  Full range of motion.  Respiratory: Patient is breathing without any difficulty.   NEUROLOGICAL:     Awake, alert, oriented to person, place, and time.  Speech is clear and fluent.   Cranial Nerves: Pupils equal round and reactive to light.  Facial tone is symmetric. Shoulder shrug is symmetric. Tongue protrusion is midline.  There is no pronator drift.  Motor Exam:  She has wasting of her thenar eminence, 4 out of 5 weakness in her lower muscles, persistent strength in her ulnar innervated lumbricals  Decree sensation in the median nerve distribution, splitting of the fourth digit.  Sparing of the palm.  She gets dysesthesias in her digits when compared to the contralateral side.  Gait is cane assisted   Medical Decision Making  Electrodiagnostics: She had EMG of right upper extremity at [facility] on 07/15/22 showing severe carpal tunnel. She did see improvement with injections. Records scanned into chart.   I have personally reviewed the images and electrodiagnostics and agree with the above interpretation.  Assessment and Plan: [name] is a pleasant 78 y.o. female with right-sided carpal tunnel syndrome.  She has had this for over a year.  She  notices that this wakes her up almost every single night.  She has to shake out her hand.  She gets burning tingling dysesthesias in her right upper extremity in the median nerve distribution on physical exam she shows some thenar wasting decree sensation and painful paresthesias in her hand.  She has positive carpal compression test.  Positive Phalen's test.  Her EMG was reviewed which demonstrates electrodiagnostically severe carpal tunnel syndrome.  Given these findings and lack of improvement with conservative management we have offered a carpal tunnel decompression she would like to go forward with the procedure.  Our  team will reach out to her for scheduling.. The symptoms are causing a significant impact on the patient's life. I have utilized the care everywhere function in epic to review the outside records available from external health systems, and have personally reviewed relevant imaging and electrodiagnostic workup.    Thank you for involving me in the care of this patient.    Frances Kim MD/MSCR Neurosurgery - Peripheral Nerve Surgery

## 2023-02-17 NOTE — Interval H&P Note (Signed)
History and Physical Interval Note:  02/17/2023 6:46 AM  Frances Greene  has presented today for surgery, with the diagnosis of G56.01 Carpal tunnel syndrome of right wrist R29.898 Weakness of right hand.  The various methods of treatment have been discussed with the patient and family. After consideration of risks, benefits and other options for treatment, the patient has consented to  Procedure(s): RIGHT CARPAL TUNNEL RELEASE WITH ULTRASOUND GUIDANCE (Right) as a surgical intervention.  The patient's history has been reviewed, patient examined, no change in status, stable for surgery.  I have reviewed the patient's chart and labs.  Questions were answered to the patient's satisfaction.    Heart and lungs clear  Lovenia Kim

## 2023-02-17 NOTE — Anesthesia Preprocedure Evaluation (Signed)
Anesthesia Evaluation  Patient identified by MRN, date of birth, ID band Patient awake    Reviewed: Allergy & Precautions, NPO status , Patient's Chart, lab work & pertinent test results  History of Anesthesia Complications Negative for: history of anesthetic complications  Airway Mallampati: III  TM Distance: >3 FB Neck ROM: full    Dental  (+) Upper Dentures, Lower Dentures   Pulmonary shortness of breath and with exertion, asthma , sleep apnea (severe) and Continuous Positive Airway Pressure Ventilation , former smoker   Pulmonary exam normal        Cardiovascular hypertension, On Medications +CHF  Normal cardiovascular exam  EKG 01/2023 Normal sinus rhythm Incomplete left bundle branch block Minimal voltage criteria for LVH, may be normal variant ( Cornell product ) Nonspecific T wave abnormality   Neuro/Psych  PSYCHIATRIC DISORDERS  Depression     Neuromuscular disease    GI/Hepatic negative GI ROS, Neg liver ROS,,,  Endo/Other  negative endocrine ROSdiabetes, Type 2    Renal/GU Renal disease  negative genitourinary   Musculoskeletal   Abdominal   Peds  Hematology  (+) Blood dyscrasia, anemia   Anesthesia Other Findings Past Medical History: No date: Anemia in stage 3b chronic kidney disease (HCC) No date: Arthritis No date: Asthma No date: Benign essential hypertension No date: Bronchitis No date: CHF (congestive heart failure) (HCC) No date: Chronic kidney disease, stage 4 (severe) (HCC)     Comment:  decreased kidney function No date: Degenerative joint disease No date: Gout No date: Heart murmur No date: Iron deficiency anemia No date: Multiple allergies No date: Obstructive sleep apnea on CPAP No date: Primary hyperparathyroidism (HCC) No date: Shortness of breath dyspnea     Comment:  w/ exertion No date: Sickle cell trait (HCC) No date: Spinal stenosis of cervical region No date: Swelling  of both lower extremities No date: Type 2 diabetes mellitus with chronic kidney disease (HCC) No date: Wheezing  Past Surgical History: No date: ABDOMINAL HYSTERECTOMY 1989: BREAST BIOPSY; Left     Comment:  neg 05/21/2015: CATARACT EXTRACTION W/PHACO; Right     Comment:  Procedure: CATARACT EXTRACTION PHACO AND INTRAOCULAR               LENS PLACEMENT (IOC);  Surgeon: Marcelene Butte, MD;                Location: [facility];  Service: Ophthalmology;  Laterality:              Right;  [place]: AP%: 10.0 CDE: 9.53 12/31/2020: CATARACT EXTRACTION W/PHACO; Left     Comment:  Procedure: CATARACT EXTRACTION PHACO AND INTRAOCULAR               LENS PLACEMENT (IOC) LEFT DIABETIC 4.13 01:00.9;                Surgeon: Lockie Mola, MD;  Location: [facility];  Service: Ophthalmology;  Laterality: Left; No date: COLONOSCOPY 08/13/2013: MEDIAL PARTIAL KNEE REPLACEMENT; Bilateral     Comment:  MAKOplasty 05/21/2015: PARS PLANA VITRECTOMY; Right     Comment:  Procedure: PARS PLANA VITRECTOMY WITH 25 GAUGE;                Surgeon: Marcelene Butte, MD;  Location: [facility];                Service: Ophthalmology;  Laterality: Right;  Lot #  [id number] H Endo laser: Watts: 300 Pulse Duration: 200               Total Pulses:143  No date: SHOULDER ARTHROSCOPY; Right     Comment:  bone spur  BMI    Body Mass Index: 37.55 kg/m      Reproductive/Obstetrics negative OB ROS                             Anesthesia Physical Anesthesia Plan  ASA: 3  Anesthesia Plan: General   Post-op Pain Management: Minimal or no pain anticipated and Ofirmev IV (intra-op)*   Induction: Intravenous  PONV Risk Score and Plan: 2 and Propofol infusion and TIVA  Airway Management Planned: Natural Airway and Nasal Cannula  Additional Equipment:   Intra-op Plan:   Post-operative Plan:   Informed Consent: I have reviewed the patients History and  Physical, chart, labs and discussed the procedure including the risks, benefits and alternatives for the proposed anesthesia with the patient or authorized representative who has indicated his/her understanding and acceptance.     Dental Advisory Given  Plan Discussed with: Anesthesiologist, CRNA and Surgeon  Anesthesia Plan Comments: (Patient consented for risks of anesthesia including but not limited to:  - adverse reactions to medications - risk of airway placement if required - damage to eyes, teeth, lips or other oral mucosa - nerve damage due to positioning  - sore throat or hoarseness - Damage to heart, brain, nerves, lungs, other parts of body or loss of life  Patient voiced understanding and assent.)        Anesthesia Quick Evaluation

## 2023-02-18 ENCOUNTER — Encounter: Payer: Self-pay | Admitting: Neurosurgery

## 2023-02-28 ENCOUNTER — Encounter: Payer: PPO | Admitting: Physician Assistant

## 2023-03-02 ENCOUNTER — Ambulatory Visit (INDEPENDENT_AMBULATORY_CARE_PROVIDER_SITE_OTHER): Payer: PPO | Admitting: Physician Assistant

## 2023-03-02 VITALS — BP 130/70 | Temp 98.0°F | Ht 63.0 in | Wt 212.0 lb

## 2023-03-02 DIAGNOSIS — Z9889 Other specified postprocedural states: Secondary | ICD-10-CM

## 2023-03-02 DIAGNOSIS — Z09 Encounter for follow-up examination after completed treatment for conditions other than malignant neoplasm: Secondary | ICD-10-CM

## 2023-03-02 DIAGNOSIS — G5601 Carpal tunnel syndrome, right upper limb: Secondary | ICD-10-CM

## 2023-03-02 NOTE — Progress Notes (Signed)
   REFERRING PHYSICIAN:  Sallyanne Kuster, Np [address] [place],  [place] [address]  DOS: Right ultrasound guided carpal tunnel release  HISTORY OF PRESENT ILLNESS: LACY SOFIA is 2 weeks status post right US guided CTR. Overall, she is doing very well and her pain has improved tremendously. She has some residual tingling, but she is no longer waking up in the middle of the night.  PHYSICAL EXAMINATION:  NEUROLOGICAL:  General: In no acute distress.   Awake, alert, oriented to person, place, and time.  Pupils equal round and reactive to light.  Facial tone is symmetric.  Tongue protrusion is midline.  There is no pronator drift.   Motor Exam:  She has wasting of her thenar eminence, 4 out of 5 weakness in her lower muscles, persistent strength in her ulnar innervated lumbricals Incision c/d/I. No erythema or drainage  Imaging:  None  Assessment / Plan: KYLIYAH STIRN is 2 weeks status post right US guided CTR. Overall, she is doing very well and her pain has improved tremendously. She has some residual tingling, but she is no longer waking up in the middle of the night. We discussed activity escalation and I have advised the patient to lift up to 10 pounds until 6 weeks after surgery, then increase up to 25 pounds until 12 weeks after surgery.  After 12 weeks post-op, the patient advised to increase activity as tolerated. she will return to clinic in approximately     Advised to contact the office if any questions or concerns arise.   Joan Flores PA-C Dept of Neurosurgery

## 2023-03-07 ENCOUNTER — Encounter: Payer: Self-pay | Admitting: Physician Assistant

## 2023-03-07 ENCOUNTER — Ambulatory Visit (INDEPENDENT_AMBULATORY_CARE_PROVIDER_SITE_OTHER): Payer: HMO | Admitting: Physician Assistant

## 2023-03-07 VITALS — BP 140/60 | HR 61 | Temp 97.6°F | Resp 16 | Ht 63.0 in | Wt 214.0 lb

## 2023-03-07 DIAGNOSIS — G4733 Obstructive sleep apnea (adult) (pediatric): Secondary | ICD-10-CM

## 2023-03-07 DIAGNOSIS — Z7189 Other specified counseling: Secondary | ICD-10-CM | POA: Diagnosis not present

## 2023-03-07 NOTE — Progress Notes (Signed)
 [facility] [address] [place], [place] [address]  Pulmonary Sleep Medicine   Office Visit Note  Patient Name: Frances Greene DOB: [birth year] MRN [id number]  Date of Service: 03/18/2023  Complaints/HPI: Pt is here for routine follow up. Doing well with CPAP. Using nightly and benefiting from use. Denies headaches, dryness, or SOB. Feels well rested in AM. Needs supplies.  CPAP compliance 01/25/23-02/23/23 Usage days 30/30 >4 hours 100% Avg use 8.5 hours Settings CPAP 11cm H2O AHI 2.3  ROS  General: (-) fever, (-) chills, (-) night sweats, (-) weakness Skin: (-) rashes, (-) itching,. Eyes: (-) visual changes, (-) redness, (-) itching. Nose and Sinuses: (-) nasal stuffiness or itchiness, (-) postnasal drip, (-) nosebleeds, (-) sinus trouble. Mouth and Throat: (-) sore throat, (-) hoarseness. Neck: (-) swollen glands, (-) enlarged thyroid, (-) neck pain. Respiratory: - cough, (-) bloody sputum, - shortness of breath, - wheezing. Cardiovascular: - ankle swelling, (-) chest pain. Lymphatic: (-) lymph node enlargement. Neurologic: (-) numbness, (-) tingling. Psychiatric: (-) anxiety, (-) depression   Current Medication: Outpatient Encounter Medications as of 03/07/2023  Medication Sig   acetaminophen (TYLENOL) 325 MG tablet Take 650 mg by mouth every 6 (six) hours as needed.   betamethasone dipropionate 0.05 % cream Apply topically 2 (two) times daily. (Patient taking differently: Apply 1 Application topically 2 (two) times daily as needed.)   Blood Glucose Monitoring Suppl (ONETOUCH VERIO REFLECT) w/Device KIT Use as directed for once daily Dx E11.65   Cholecalciferol (VITAMIN D3) 1.25 MG (50000 UT) CAPS Take 1 capsule (1.25 mg total) by mouth once a week. (Patient taking differently: Take 1 capsule by mouth once a week. Monday)   cinacalcet (SENSIPAR) 30 MG tablet 30 mg daily with breakfast.   citalopram (CELEXA) 10 MG tablet Take 1 tablet (10 mg total) by  mouth daily.   cyanocobalamin (VITAMIN B12) 100 MCG tablet Take 100 mcg by mouth daily.   ferrous sulfate 325 (65 FE) MG tablet Take 325 mg by mouth daily with breakfast.   fluticasone-salmeterol (ADVAIR) 100-50 MCG/ACT AEPB Inhale 1 puff into the lungs 2 (two) times daily.   Garlic 1000 MG CAPS Take 1 capsule by mouth daily.   glucose blood (ONETOUCH VERIO) test strip Use as directed once a daily   Insulin Pen Needle (COMFORT EZ PEN NEEDLES) 32G X 4 MM MISC Use with victoza injections daily   ketoconazole (NIZORAL) 2 % shampoo SHAMPOO AS DIRECTED   latanoprost (XALATAN) 0.005 % ophthalmic solution latanoprost 0.005 % eye drops  INSTILL 1 DROP INTO EACH EYE IN THE EVENING   mirabegron ER (MYRBETRIQ) 50 MG TB24 tablet Take 1 tablet (50 mg total) by mouth daily.   montelukast (SINGULAIR) 10 MG tablet Take 1 tablet (10 mg total) by mouth every evening. (Patient taking differently: Take 10 mg by mouth daily as needed.)   NON FORMULARY CPAP pt uses [facility] for CPAP   Omega-3 Fatty Acids (FISH OIL PO) Take 1,000 mg by mouth daily.   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   tizanidine (ZANAFLEX) 2 MG capsule Take one tab po qhs for spasm (Patient taking differently: Take 2 mg by mouth at bedtime as needed for muscle spasms. Take one tab po qhs for spasm)   VICTOZA 18 MG/3ML SOPN INJECT 1.8 MG ONCE DAILY SUBCUTANEOUSLY (Patient taking differently: Inject 1.8 mg into the skin at  bedtime. INJECT 1.8 MG ONCE DAILY SUBCUTANEOUSLY)   [DISCONTINUED] atorvastatin (LIPITOR) 10 MG tablet Take 1 tablet (10 mg total) by mouth daily.   [DISCONTINUED] bisoprolol (ZEBETA) 5 MG tablet Take 1 tablet (5 mg total) by mouth daily.   [DISCONTINUED] empagliflozin (JARDIANCE) 10 MG TABS tablet Take 1 tablet (10 mg total) by mouth daily.   [DISCONTINUED] febuxostat (ULORIC) 40 MG tablet Take 1 tablet (40 mg total) by  mouth daily.   [DISCONTINUED] folic acid (FOLVITE) 1 MG tablet Take 1 tablet (1 mg total) by mouth daily.   [DISCONTINUED] furosemide (LASIX) 80 MG tablet Take 1 tablet (80 mg total) by mouth every morning.   [DISCONTINUED] hydrALAZINE (APRESOLINE) 25 MG tablet TAKE ONE TABLET BY MOUTH THREE TIMES DAILY   [DISCONTINUED] meloxicam (MOBIC) 7.5 MG tablet TAKE 1 TABLET BY MOUTH ONCE DAILY FOR  GOUT  AND  ARTHRITIS   [DISCONTINUED] traZODone (DESYREL) 50 MG tablet Take 0.5-1 tablets (25-50 mg total) by mouth at bedtime as needed for sleep.   [DISCONTINUED] valACYclovir (VALTREX) 500 MG tablet Take 1 tablet (500 mg total) by mouth daily.   No facility-administered encounter medications on file as of 03/07/2023.    Surgical History: Past Surgical History:  Procedure Laterality Date   ABDOMINAL HYSTERECTOMY     BREAST BIOPSY Left 1989   neg   CARPAL TUNNEL RELEASE Right 02/17/2023   Procedure: RIGHT CARPAL TUNNEL RELEASE WITH ULTRASOUND GUIDANCE;  Surgeon: Lovenia Kim, MD;  Location: [facility] OR[facility];  Service: Neurosurgery;  Laterality: Right;   CATARACT EXTRACTION W/PHACO Right 05/21/2015   Procedure: CATARACT EXTRACTION PHACO AND INTRAOCULAR LENS PLACEMENT (IOC);  Surgeon: Marcelene Butte, MD;  Location: [facility];  Service: Ophthalmology;  Laterality: Right;  [place]: AP%: 10.0 CDE: 9.53   CATARACT EXTRACTION W/PHACO Left 12/31/2020   Procedure: CATARACT EXTRACTION PHACO AND INTRAOCULAR LENS PLACEMENT (IOC) LEFT DIABETIC 4.13 01:00.9;  Surgeon: Lockie Mola, MD;  Location: [facility];  Service: Ophthalmology;  Laterality: Left;   COLONOSCOPY     MEDIAL PARTIAL KNEE REPLACEMENT Bilateral 08/13/2013   MAKOplasty   PARS PLANA VITRECTOMY Right 05/21/2015   Procedure: PARS PLANA VITRECTOMY WITH 25 GAUGE;  Surgeon: Marcelene Butte, MD;  Location: [facility];  Service: Ophthalmology;  Laterality: Right;  Lot # [id number] Endo laser: Watts: 300 Pulse Duration: 200 Total Pulses:143    SHOULDER  ARTHROSCOPY Right    bone spur    Medical History: Past Medical History:  Diagnosis Date   Anemia in stage 3b chronic kidney disease (HCC)    Arthritis    Asthma    Benign essential hypertension    Bronchitis    CHF (congestive heart failure) (HCC)    Chronic kidney disease, stage 4 (severe) (HCC)    decreased kidney function   Degenerative joint disease    Gout    Heart murmur    Iron deficiency anemia    Multiple allergies    Obstructive sleep apnea on CPAP    Primary hyperparathyroidism (HCC)    Shortness of breath dyspnea    w/ exertion   Sickle cell trait (HCC)    Spinal stenosis of cervical region    Swelling of both lower extremities    Type 2 diabetes mellitus with chronic kidney disease (HCC)    Wheezing     Family History: Family History  Problem Relation Age of Onset   Breast cancer Cousin    Breast cancer Other    Hypertension Mother    Diabetes Mother  Hypertension Father    Diabetes Father    Alcohol abuse Father    Aneurysm Sister    Alcohol abuse Sister    Stroke Sister        died from stroke   Hepatitis Brother    Hypertension Sister        dialysis   Diabetes Sister    Alcoholism Brother    Alcoholism Brother        dies from head injury from a fall.  not an alcoholic,   Cancer Brother    Diabetes Brother    Hypertension Brother    Diabetes Brother    Cancer Brother    Hypertension Brother    Stroke Brother    Alcoholism Brother     Social History: Social History   Socioeconomic History   Marital status: Divorced    Spouse name: Not on file   Number of children: 1   Years of education: Not on file   Highest education level: Not on file  Occupational History   Not on file  Tobacco Use   Smoking status: Former    Types: Cigarettes   Smokeless tobacco: Never   Tobacco comments:    4 cigarettes quit 20 years ago  Vaping Use   Vaping status: Never Used  Substance and Sexual Activity   Alcohol use: Not Currently   Drug  use: Never   Sexual activity: Not on file  Other Topics Concern   Not on file  Social History Narrative   Lives alone   Social Drivers of Health   Financial Resource Strain: Medium Risk (06/01/2021)   Received from [facility], [facility]   Overall Financial Resource Strain (CARDIA)    Difficulty of Paying Living Expenses: Somewhat hard  Food Insecurity: No Food Insecurity (03/11/2022)   [facility]   Hunger Vital Sign    Worried About Running Out of Food in the Last Year: Never true    Ran Out of Food in the Last Year: Never true  Transportation Needs: No Transportation Needs (03/11/2022)   [facility]   [facility] - Transportation    Lack of Transportation (Medical): No    Lack of Transportation (Non-Medical): No  Physical Activity: Not on file  Stress: Not on file  Social Connections: Not on file  Intimate Partner Violence: Not on file    Vital Signs: Blood pressure (!) 140/60, pulse 61, temperature 97.6 F (36.4 C), resp. rate 16, height 5\' 3"  (1.6 m), weight 214 lb (97.1 kg), SpO2 94%.  Examination: General Appearance: The patient is well-developed, well-nourished, and in no distress. Skin: Gross inspection of skin unremarkable. Head: normocephalic, no gross deformities. Eyes: no gross deformities noted. ENT: ears appear grossly normal no exudates. Neck: Supple. No thyromegaly. No LAD. Respiratory: Lungs clear to auscultation. Cardiovascular: Normal S1 and S2 without murmur or rub. Extremities: No cyanosis. pulses are equal. Neurologic: Alert and oriented. No involuntary movements.  LABS: Recent Results (from the past 2160 hours)  Basic metabolic panel per protocol     Status: Abnormal   Collection Time: 02/02/23  8:15 AM  Result Value Ref Range   Sodium 142 135 - 145 mmol/L   Potassium 3.6 3.5 - 5.1 mmol/L   Chloride 106 98 - 111 mmol/L   CO2 27 22 - 32 mmol/L   Glucose, Bld  119 (H) 70 - 99 mg/dL    Comment: Glucose reference range applies only to samples  taken after fasting for at least 8 hours.   BUN 33 (H) 8 - 23 mg/dL   Creatinine, Ser 5.62 (H) 0.44 - 1.00 mg/dL   Calcium 9.7 8.9 - 13.0 mg/dL   GFR, Estimated 28 (L) >60 mL/min    Comment: (NOTE) Calculated using the CKD-EPI Creatinine Equation (2021)    Anion gap 9 5 - 15    Comment: Performed at [facility], [address] [place], [place] [address]  CBC per protocol     Status: Abnormal   Collection Time: 02/02/23  8:15 AM  Result Value Ref Range   WBC 6.6 4.0 - 10.5 K/uL   RBC 4.51 3.87 - 5.11 MIL/uL   Hemoglobin 13.4 12.0 - 15.0 g/dL   HCT 46.9 62.9 - 52.8 %   MCV 90.2 80.0 - 100.0 fL   MCH 29.7 26.0 - 34.0 pg   MCHC 32.9 30.0 - 36.0 g/dL   RDW 41.3 24.4 - 01.0 %   Platelets 139 (L) 150 - 400 K/uL   nRBC 0.0 0.0 - 0.2 %    Comment: Performed at [facility], [address] [place], [place] [address]  Glucose, capillary     Status: Abnormal   Collection Time: 02/17/23  6:52 AM  Result Value Ref Range   Glucose-Capillary 139 (H) 70 - 99 mg/dL    Comment: Glucose reference range applies only to samples taken after fasting for at least 8 hours.  Glucose, capillary     Status: Abnormal   Collection Time: 02/17/23  8:03 AM  Result Value Ref Range   Glucose-Capillary 152 (H) 70 - 99 mg/dL    Comment: Glucose reference range applies only to samples taken after fasting for at least 8 hours.  POCT glycosylated hemoglobin (Hb A1C)     Status: Abnormal   Collection Time: 03/16/23  3:59 PM  Result Value Ref Range   Hemoglobin A1C 6.5 (A) 4.0 - 5.6 %   HbA1c POC (<> result, manual entry)     HbA1c, POC (prediabetic range)     HbA1c, POC (controlled diabetic range)    Microalbumin / creatinine urine ratio     Status: Abnormal   Collection Time: 03/16/23  4:19 PM  Result Value Ref Range   Creatinine, Urine 83.5 Not Estab. mg/dL   Microalbumin, Urine 66.4 Not Estab. ug/mL    Microalb/Creat Ratio 87 (H) 0 - 29 mg/g creat    Comment:                        Normal:                0 -  29                        Moderately increased: 30 - 300                        Severely increased:       >300     Radiology: No results found.  No results found.  No results found.    Assessment and Plan: Patient Active Problem List   Diagnosis Date Noted   Carpal tunnel syndrome of right wrist 01/05/2023   Weakness of right hand 01/05/2023   Sleep related leg cramps 10/26/2022   Type 2 diabetes mellitus with vascular disease (HCC) 10/26/2022   Allergic contact dermatitis due to drugs in contact with skin 10/26/2022  Hypertension associated with stage 3b chronic kidney disease due to type 2 diabetes mellitus (HCC) 10/10/2022   Chronic heart failure with preserved ejection fraction (HCC) 06/11/2021   Thrombocytopenia (HCC) 06/11/2021   B12 deficiency 03/19/2020   Anemia in stage 3b chronic kidney disease (HCC) 03/19/2020   Acute bilateral low back pain with right-sided sciatica 11/12/2018   Pain due to onychomycosis of toenails of both feet 07/17/2018   Moderate episode of recurrent major depressive disorder (HCC) 11/14/2017   Chronic allergic rhinitis 11/14/2017   Primary insomnia 11/14/2017   Type 2 diabetes mellitus with chronic kidney disease (HCC) 01/19/2017   Cervicalgia 01/19/2017   Vitamin D deficiency 01/19/2017   OSA (obstructive sleep apnea) 01/19/2017   Hyperlipidemia associated with type 2 diabetes mellitus (HCC) 01/19/2017   Edema 01/19/2017   Gout 01/19/2017   Morbid obesity (HCC) 01/19/2017   CKD stage 3 due to type 2 diabetes mellitus (HCC) 01/19/2017   Hypercalcemia 01/19/2017   Iron deficiency anemia 01/14/2016   Status post bilateral unicompartmental knee replacement 11/13/2013   Arthritis, senescent 08/02/2013   1. OSA (obstructive sleep apnea) (Primary) Continue excellent compliance - For home use only DME continuous positive airway  pressure (CPAP)  2. CPAP use counseling CPAP couseling-Discussed importance of adequate CPAP use as well as proper care and cleaning techniques of machine and all supplies.    General Counseling: I have discussed the findings of the evaluation and examination with Britta Mccreedy.  I have also discussed any further diagnostic evaluation thatmay be needed or ordered today. Jereline verbalizes understanding of the findings of todays visit. We also reviewed her medications today and discussed drug interactions and side effects including but not limited excessive drowsiness and altered mental states. We also discussed that there is always a risk not just to her but also people around her. she has been encouraged to call the office with any questions or concerns that should arise related to todays visit.  Orders Placed This Encounter  Procedures   For home use only DME continuous positive airway pressure (CPAP)    Needs all supplies    Length of Need:   Lifetime    Patient has OSA or probable OSA:   Yes    Is the patient currently using CPAP in the home:   Yes    Settings:   Other see comments    CPAP supplies needed:   Mask, headgear, cushions, filters, heated tubing and water chamber    Additional equipment included:   Heated humification and supplies     Time spent: 30  I have personally obtained a history, examined the patient, evaluated laboratory and imaging results, formulated the assessment and plan and placed orders. This patient was seen by Lynn Ito, PA-C in collaboration with Dr. Freda Munro as a part of collaborative care agreement.     Yevonne Pax, MD [facility] Pulmonary and Critical Care Sleep medicine

## 2023-03-08 ENCOUNTER — Telehealth: Payer: Self-pay | Admitting: Physician Assistant

## 2023-03-08 NOTE — Telephone Encounter (Signed)
Notified [facility] w/ [facility] of cpap supply order-Toni

## 2023-03-16 ENCOUNTER — Encounter: Payer: Self-pay | Admitting: Nurse Practitioner

## 2023-03-16 ENCOUNTER — Ambulatory Visit (INDEPENDENT_AMBULATORY_CARE_PROVIDER_SITE_OTHER): Payer: HMO | Admitting: Nurse Practitioner

## 2023-03-16 VITALS — BP 135/70 | HR 70 | Temp 98.3°F | Resp 16 | Ht 63.0 in | Wt 216.2 lb

## 2023-03-16 DIAGNOSIS — I129 Hypertensive chronic kidney disease with stage 1 through stage 4 chronic kidney disease, or unspecified chronic kidney disease: Secondary | ICD-10-CM

## 2023-03-16 DIAGNOSIS — E1122 Type 2 diabetes mellitus with diabetic chronic kidney disease: Secondary | ICD-10-CM

## 2023-03-16 DIAGNOSIS — E1169 Type 2 diabetes mellitus with other specified complication: Secondary | ICD-10-CM | POA: Diagnosis not present

## 2023-03-16 DIAGNOSIS — E1159 Type 2 diabetes mellitus with other circulatory complications: Secondary | ICD-10-CM | POA: Diagnosis not present

## 2023-03-16 DIAGNOSIS — D509 Iron deficiency anemia, unspecified: Secondary | ICD-10-CM | POA: Diagnosis not present

## 2023-03-16 DIAGNOSIS — N183 Chronic kidney disease, stage 3 unspecified: Secondary | ICD-10-CM

## 2023-03-16 DIAGNOSIS — E785 Hyperlipidemia, unspecified: Secondary | ICD-10-CM

## 2023-03-16 DIAGNOSIS — N1832 Chronic kidney disease, stage 3b: Secondary | ICD-10-CM | POA: Diagnosis not present

## 2023-03-16 DIAGNOSIS — Z79899 Other long term (current) drug therapy: Secondary | ICD-10-CM

## 2023-03-16 LAB — POCT GLYCOSYLATED HEMOGLOBIN (HGB A1C): Hemoglobin A1C: 6.5 % — AB (ref 4.0–5.6)

## 2023-03-16 MED ORDER — ATORVASTATIN CALCIUM 10 MG PO TABS
10.0000 mg | ORAL_TABLET | Freq: Every day | ORAL | 2 refills | Status: DC
Start: 2023-03-16 — End: 2023-09-14

## 2023-03-16 MED ORDER — FOLIC ACID 1 MG PO TABS: 1.0000 mg | ORAL_TABLET | Freq: Every day | ORAL | 1 refills | Status: DC

## 2023-03-16 MED ORDER — BISOPROLOL FUMARATE 5 MG PO TABS
5.0000 mg | ORAL_TABLET | Freq: Every day | ORAL | 1 refills | Status: DC
Start: 2023-03-16 — End: 2023-09-14

## 2023-03-16 MED ORDER — EMPAGLIFLOZIN 10 MG PO TABS
10.0000 mg | ORAL_TABLET | Freq: Every day | ORAL | 1 refills | Status: DC
Start: 2023-03-16 — End: 2023-09-14

## 2023-03-16 MED ORDER — FEBUXOSTAT 40 MG PO TABS
40.0000 mg | ORAL_TABLET | Freq: Every day | ORAL | 1 refills | Status: DC
Start: 2023-03-16 — End: 2023-09-14

## 2023-03-16 MED ORDER — VALACYCLOVIR HCL 500 MG PO TABS
500.0000 mg | ORAL_TABLET | Freq: Every day | ORAL | 1 refills | Status: DC
Start: 2023-03-16 — End: 2023-09-14

## 2023-03-16 MED ORDER — HYDRALAZINE HCL 25 MG PO TABS: ORAL_TABLET | ORAL | 2 refills | Status: DC

## 2023-03-16 MED ORDER — MELOXICAM 7.5 MG PO TABS: ORAL_TABLET | ORAL | 1 refills | Status: DC

## 2023-03-16 MED ORDER — FUROSEMIDE 80 MG PO TABS
80.0000 mg | ORAL_TABLET | Freq: Every morning | ORAL | 1 refills | Status: DC
Start: 2023-03-16 — End: 2023-09-14

## 2023-03-16 NOTE — Progress Notes (Signed)
 [facility] [address] [place], [place] [address]  Internal MEDICINE  Office Visit Note  Patient Name: Frances Greene  [id number]  [id number]  Date of Service: 03/16/2023  Chief Complaint  Patient presents with   Diabetes   Hypertension   Follow-up    HPI Shaquille presents for a follow-up visit for diabetes, hypertension, high cholesterol and refills. Diabetes -- A1c slightly elevated at 6.5, stable on current medications  Famiyl member just passed away this afternoon, patient is grieving.  Elebated BP, improved when rechecked. BP controlled with current medications.  Medication refills.  Having flare up of rhinitis.     Current Medication: Outpatient Encounter Medications as of 03/16/2023  Medication Sig   acetaminophen (TYLENOL) 325 MG tablet Take 650 mg by mouth every 6 (six) hours as needed.   atorvastatin (LIPITOR) 10 MG tablet Take 1 tablet (10 mg total) by mouth daily.   betamethasone dipropionate 0.05 % cream Apply topically 2 (two) times daily. (Patient taking differently: Apply 1 Application topically 2 (two) times daily as needed.)   bisoprolol (ZEBETA) 5 MG tablet Take 1 tablet (5 mg total) by mouth daily.   Blood Glucose Monitoring Suppl (ONETOUCH VERIO REFLECT) w/Device KIT Use as directed for once daily Dx E11.65   Cholecalciferol (VITAMIN D3) 1.25 MG (50000 UT) CAPS Take 1 capsule (1.25 mg total) by mouth once a week. (Patient taking differently: Take 1 capsule by mouth once a week. Monday)   cinacalcet (SENSIPAR) 30 MG tablet 30 mg daily with breakfast.   citalopram (CELEXA) 10 MG tablet Take 1 tablet (10 mg total) by mouth daily.   cyanocobalamin (VITAMIN B12) 100 MCG tablet Take 100 mcg by mouth daily.   empagliflozin (JARDIANCE) 10 MG TABS tablet Take 1 tablet (10 mg total) by mouth daily.   febuxostat (ULORIC) 40 MG tablet Take 1 tablet (40 mg total) by mouth daily.   ferrous sulfate 325 (65 FE) MG tablet Take 325 mg by mouth daily with  breakfast.   fluticasone-salmeterol (ADVAIR) 100-50 MCG/ACT AEPB Inhale 1 puff into the lungs 2 (two) times daily.   folic acid (FOLVITE) 1 MG tablet Take 1 tablet (1 mg total) by mouth daily.   furosemide (LASIX) 80 MG tablet Take 1 tablet (80 mg total) by mouth every morning.   Garlic 1000 MG CAPS Take 1 capsule by mouth daily.   glucose blood (ONETOUCH VERIO) test strip Use as directed once a daily   hydrALAZINE (APRESOLINE) 25 MG tablet TAKE ONE TABLET BY MOUTH THREE TIMES DAILY   Insulin Pen Needle (COMFORT EZ PEN NEEDLES) 32G X 4 MM MISC Use with victoza injections daily   ketoconazole (NIZORAL) 2 % shampoo SHAMPOO AS DIRECTED   latanoprost (XALATAN) 0.005 % ophthalmic solution latanoprost 0.005 % eye drops  INSTILL 1 DROP INTO EACH EYE IN THE EVENING   meloxicam (MOBIC) 7.5 MG tablet TAKE 1 TABLET BY MOUTH ONCE DAILY FOR  GOUT  AND  ARTHRITIS   mirabegron ER (MYRBETRIQ) 50 MG TB24 tablet Take 1 tablet (50 mg total) by mouth daily.   montelukast (SINGULAIR) 10 MG tablet Take 1 tablet (10 mg total) by mouth every evening. (Patient taking differently: Take 10 mg by mouth daily as needed.)   NON FORMULARY CPAP pt uses [facility] for CPAP   Omega-3 Fatty Acids (FISH OIL PO) Take 1,000 mg by mouth daily.   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   tizanidine (ZANAFLEX) 2 MG capsule Take one tab po qhs for spasm (Patient taking differently: Take 2 mg by mouth at bedtime as needed for muscle spasms. Take one tab po qhs for spasm)   valACYclovir (VALTREX) 500 MG tablet Take 1 tablet (500 mg total) by mouth daily.   VICTOZA 18 MG/3ML SOPN INJECT 1.8 MG ONCE DAILY SUBCUTANEOUSLY (Patient taking differently: Inject 1.8 mg into the skin at bedtime. INJECT 1.8 MG ONCE DAILY SUBCUTANEOUSLY)   [DISCONTINUED] atorvastatin (LIPITOR) 10 MG tablet Take 1 tablet (10 mg total) by mouth daily.    [DISCONTINUED] bisoprolol (ZEBETA) 5 MG tablet Take 1 tablet (5 mg total) by mouth daily.   [DISCONTINUED] empagliflozin (JARDIANCE) 10 MG TABS tablet Take 1 tablet (10 mg total) by mouth daily.   [DISCONTINUED] febuxostat (ULORIC) 40 MG tablet Take 1 tablet (40 mg total) by mouth daily.   [DISCONTINUED] folic acid (FOLVITE) 1 MG tablet Take 1 tablet (1 mg total) by mouth daily.   [DISCONTINUED] furosemide (LASIX) 80 MG tablet Take 1 tablet (80 mg total) by mouth every morning.   [DISCONTINUED] hydrALAZINE (APRESOLINE) 25 MG tablet TAKE ONE TABLET BY MOUTH THREE TIMES DAILY   [DISCONTINUED] meloxicam (MOBIC) 7.5 MG tablet TAKE 1 TABLET BY MOUTH ONCE DAILY FOR  GOUT  AND  ARTHRITIS   [DISCONTINUED] traZODone (DESYREL) 50 MG tablet Take 0.5-1 tablets (25-50 mg total) by mouth at bedtime as needed for sleep.   [DISCONTINUED] valACYclovir (VALTREX) 500 MG tablet Take 1 tablet (500 mg total) by mouth daily.   No facility-administered encounter medications on file as of 03/16/2023.    Surgical History: Past Surgical History:  Procedure Laterality Date   ABDOMINAL HYSTERECTOMY     BREAST BIOPSY Left 1989   neg   CARPAL TUNNEL RELEASE Right 02/17/2023   Procedure: RIGHT CARPAL TUNNEL RELEASE WITH ULTRASOUND GUIDANCE;  Surgeon: Lovenia Kim, MD;  Location: [facility];  Service: Neurosurgery;  Laterality: Right;   CATARACT EXTRACTION W/PHACO Right 05/21/2015   Procedure: CATARACT EXTRACTION PHACO AND INTRAOCULAR LENS PLACEMENT (IOC);  Surgeon: Marcelene Butte, MD;  Location: [facility];  Service: Ophthalmology;  Laterality: Right;  [place]: AP%: 10.0 CDE: 9.53   CATARACT EXTRACTION W/PHACO Left 12/31/2020   Procedure: CATARACT EXTRACTION PHACO AND INTRAOCULAR LENS PLACEMENT (IOC) LEFT DIABETIC 4.13 01:00.9;  Surgeon: Lockie Mola, MD;  Location: [facility];  Service: Ophthalmology;  Laterality: Left;   COLONOSCOPY     MEDIAL PARTIAL KNEE REPLACEMENT Bilateral 08/13/2013   MAKOplasty    PARS PLANA VITRECTOMY Right 05/21/2015   Procedure: PARS PLANA VITRECTOMY WITH 25 GAUGE;  Surgeon: Marcelene Butte, MD;  Location: [facility];  Service: Ophthalmology;  Laterality: Right;  Lot # [id number] Endo laser: Watts: 300 Pulse Duration: 200 Total Pulses:143    SHOULDER ARTHROSCOPY Right    bone spur    Medical History: Past Medical History:  Diagnosis Date   Anemia in stage 3b chronic kidney disease (HCC)    Arthritis    Asthma    Benign essential hypertension    Bronchitis    CHF (congestive heart failure) (HCC)    Chronic kidney disease, stage 4 (severe) (HCC)    decreased kidney function   Degenerative joint disease    Gout    Heart murmur    Iron deficiency anemia    Multiple allergies    Obstructive sleep apnea on CPAP    Primary hyperparathyroidism (HCC)    Shortness of breath dyspnea  w/ exertion   Sickle cell trait (HCC)    Spinal stenosis of cervical region    Swelling of both lower extremities    Type 2 diabetes mellitus with chronic kidney disease (HCC)    Wheezing     Family History: Family History  Problem Relation Age of Onset   Breast cancer Cousin    Breast cancer Other    Hypertension Mother    Diabetes Mother    Hypertension Father    Diabetes Father    Alcohol abuse Father    Aneurysm Sister    Alcohol abuse Sister    Stroke Sister        died from stroke   Hepatitis Brother    Hypertension Sister        dialysis   Diabetes Sister    Alcoholism Brother    Alcoholism Brother        dies from head injury from a fall.  not an alcoholic,   Cancer Brother    Diabetes Brother    Hypertension Brother    Diabetes Brother    Cancer Brother    Hypertension Brother    Stroke Brother    Alcoholism Brother     Social History   Socioeconomic History   Marital status: Divorced    Spouse name: Not on file   Number of children: 1   Years of education: Not on file   Highest education level: Not on file  Occupational History   Not on  file  Tobacco Use   Smoking status: Former    Types: Cigarettes   Smokeless tobacco: Never   Tobacco comments:    4 cigarettes quit 20 years ago  Vaping Use   Vaping status: Never Used  Substance and Sexual Activity   Alcohol use: Not Currently   Drug use: Never   Sexual activity: Not on file  Other Topics Concern   Not on file  Social History Narrative   Lives alone   Social Drivers of Health   Financial Resource Strain: Medium Risk (06/01/2021)   Received from [facility]   Overall Financial Resource Strain (CARDIA)    Difficulty of Paying Living Expenses: Somewhat hard  Food Insecurity: No Food Insecurity (03/11/2022)   [facility]   Hunger Vital Sign    Worried About Running Out of Food in the Last Year: Never true    Ran Out of Food in the Last Year: Never true  Transportation Needs: No Transportation Needs (03/11/2022)   [facility]   [facility] - [facility]    Lack of Transportation (Medical): No    Lack of Transportation (Non-Medical): No  Physical Activity: Not on file  Stress: Not on file  Social Connections: Not on file  Intimate Partner Violence: Not on file      Review of Systems  Constitutional:  Positive for activity change and fatigue. Negative for chills and unexpected weight change.  HENT:  Negative for congestion, rhinorrhea, sneezing and sore throat.   Eyes:  Negative for redness.  Respiratory:  Positive for shortness of breath (intermittent). Negative for cough and chest tightness.   Cardiovascular:  Positive for leg swelling (intermittent). Negative for chest pain and palpitations.  Gastrointestinal:  Negative for abdominal pain, constipation, diarrhea, nausea and vomiting.  Genitourinary:  Negative for dysuria and frequency.  Musculoskeletal:  Positive for arthralgias (right hand, right ring finger). Negative for back pain, joint swelling and neck  pain.  Skin:  Negative for rash.  Neurological:  Positive for numbness (right hand, intermittent related to arthritis). Negative for tremors.  Hematological:  Negative for adenopathy. Does not bruise/bleed easily.  Psychiatric/Behavioral:  Negative for behavioral problems (Depression), sleep disturbance and suicidal ideas. The patient is not nervous/anxious.     Vital Signs: BP (!) 157/74   Pulse 70   Temp 98.3 F (36.8 C)   Resp 16   Ht 5\' 3"  (1.6 m)   Wt 216 lb 3.2 oz (98.1 kg)   SpO2 93%   BMI 38.30 kg/m    Physical Exam Vitals reviewed.  Constitutional:      General: She is not in acute distress.    Appearance: Normal appearance. She is obese. She is not ill-appearing.  HENT:     Head: Normocephalic and atraumatic.  Eyes:     Pupils: Pupils are equal, round, and reactive to light.  Cardiovascular:     Rate and Rhythm: Normal rate and regular rhythm.  Pulmonary:     Effort: Pulmonary effort is normal. No respiratory distress.  Neurological:     Mental Status: She is alert and oriented to person, place, and time.     Cranial Nerves: No cranial nerve deficit.     Coordination: Coordination normal.     Gait: Gait normal.  Psychiatric:        Mood and Affect: Mood normal.        Behavior: Behavior normal.        Assessment/Plan: 1. Type 2 diabetes mellitus with stage 3b chronic kidney disease, without long-term current use of insulin (HCC) (Primary) A1c is stable. Urine sent for microalbumin/creatinine ratio. Continue victoza and jardiance as prescribed.  - POCT glycosylated hemoglobin (Hb A1C) - Microalbumin / creatinine urine ratio - empagliflozin (JARDIANCE) 10 MG TABS tablet; Take 1 tablet (10 mg total) by mouth daily.  Dispense: 90 tablet; Refill: 1  2. CKD stage 3 due to type 2 diabetes mellitus (HCC) Will continue to monitor kidney function labs. Currently on furosemide.   3. Hypertension associated with stage 3b chronic kidney disease due to type 2  diabetes mellitus (HCC) Stable, continue bisoprolol, furosemide and hydralazine as prescribed.  - bisoprolol (ZEBETA) 5 MG tablet; Take 1 tablet (5 mg total) by mouth daily.  Dispense: 90 tablet; Refill: 1 - hydrALAZINE (APRESOLINE) 25 MG tablet; TAKE ONE TABLET BY MOUTH THREE TIMES DAILY  Dispense: 270 tablet; Refill: 2 - furosemide (LASIX) 80 MG tablet; Take 1 tablet (80 mg total) by mouth every morning.  Dispense: 90 tablet; Refill: 1  4. Hyperlipidemia associated with type 2 diabetes mellitus (HCC) Continue atorvastatin as prescribed.  - atorvastatin (LIPITOR) 10 MG tablet; Take 1 tablet (10 mg total) by mouth daily.  Dispense: 90 tablet; Refill: 2  5. Iron deficiency anemia, unspecified iron deficiency anemia type Continue folic acid as prescribed.  - folic acid (FOLVITE) 1 MG tablet; Take 1 tablet (1 mg total) by mouth daily.  Dispense: 90 tablet; Refill: 1  6. Encounter for medication review Medication list reviewed, updated and refills ordered.  - febuxostat (ULORIC) 40 MG tablet; Take 1 tablet (40 mg total) by mouth daily.  Dispense: 90 tablet; Refill: 1 - valACYclovir (VALTREX) 500 MG tablet; Take 1 tablet (500 mg total) by mouth daily.  Dispense: 90 tablet; Refill: 1 - meloxicam (MOBIC) 7.5 MG tablet; TAKE 1 TABLET BY MOUTH ONCE DAILY FOR  GOUT  AND  ARTHRITIS  Dispense: 90 tablet; Refill: 1   General  Counseling: jocabed cheese understanding of the findings of todays visit and agrees with plan of treatment. I have discussed any further diagnostic evaluation that may be needed or ordered today. We also reviewed her medications today. she has been encouraged to call the office with any questions or concerns that should arise related to todays visit.    Orders Placed This Encounter  Procedures   Microalbumin / creatinine urine ratio   POCT glycosylated hemoglobin (Hb A1C)    Meds ordered this encounter  Medications   bisoprolol (ZEBETA) 5 MG tablet    Sig: Take 1 tablet  (5 mg total) by mouth daily.    Dispense:  90 tablet    Refill:  1    For future refills, keep on file   atorvastatin (LIPITOR) 10 MG tablet    Sig: Take 1 tablet (10 mg total) by mouth daily.    Dispense:  90 tablet    Refill:  2    For future refills, keep on file   hydrALAZINE (APRESOLINE) 25 MG tablet    Sig: TAKE ONE TABLET BY MOUTH THREE TIMES DAILY    Dispense:  270 tablet    Refill:  2    For future refills, keep on file   furosemide (LASIX) 80 MG tablet    Sig: Take 1 tablet (80 mg total) by mouth every morning.    Dispense:  90 tablet    Refill:  1    For future refills, keep on file   folic acid (FOLVITE) 1 MG tablet    Sig: Take 1 tablet (1 mg total) by mouth daily.    Dispense:  90 tablet    Refill:  1    For future refills, keep on file   febuxostat (ULORIC) 40 MG tablet    Sig: Take 1 tablet (40 mg total) by mouth daily.    Dispense:  90 tablet    Refill:  1    For future refills, keep on file   empagliflozin (JARDIANCE) 10 MG TABS tablet    Sig: Take 1 tablet (10 mg total) by mouth daily.    Dispense:  90 tablet    Refill:  1    For future refills, keep on file   valACYclovir (VALTREX) 500 MG tablet    Sig: Take 1 tablet (500 mg total) by mouth daily.    Dispense:  90 tablet    Refill:  1    For future refills, keep on file   meloxicam (MOBIC) 7.5 MG tablet    Sig: TAKE 1 TABLET BY MOUTH ONCE DAILY FOR  GOUT  AND  ARTHRITIS    Dispense:  90 tablet    Refill:  1    For future refills, keep on file    Return for previously scheduled, AWV, Jamelle Noy PCP in august.   Total time spent:30 Minutes Time spent includes review of chart, medications, test results, and follow up plan with the patient.   [place] Controlled Substance Database was reviewed by me.  This patient was seen by Sallyanne Kuster, FNP-C in collaboration with Dr. Beverely Risen as a part of collaborative care agreement.   Shanette Tamargo R. Tedd Sias, MSN, FNP-C Internal medicine

## 2023-03-17 LAB — MICROALBUMIN / CREATININE URINE RATIO
Creatinine, Urine: 83.5 mg/dL
Microalb/Creat Ratio: 87 mg/g{creat} — ABNORMAL HIGH (ref 0–29)
Microalbumin, Urine: 73 ug/mL

## 2023-03-21 DIAGNOSIS — I10 Essential (primary) hypertension: Secondary | ICD-10-CM | POA: Diagnosis not present

## 2023-03-21 DIAGNOSIS — I5032 Chronic diastolic (congestive) heart failure: Secondary | ICD-10-CM | POA: Diagnosis not present

## 2023-03-21 DIAGNOSIS — E210 Primary hyperparathyroidism: Secondary | ICD-10-CM | POA: Diagnosis not present

## 2023-03-21 DIAGNOSIS — E1122 Type 2 diabetes mellitus with diabetic chronic kidney disease: Secondary | ICD-10-CM | POA: Diagnosis not present

## 2023-03-21 DIAGNOSIS — N1832 Chronic kidney disease, stage 3b: Secondary | ICD-10-CM | POA: Diagnosis not present

## 2023-03-28 ENCOUNTER — Encounter: Payer: PPO | Admitting: Neurosurgery

## 2023-03-30 ENCOUNTER — Ambulatory Visit (INDEPENDENT_AMBULATORY_CARE_PROVIDER_SITE_OTHER): Payer: PPO | Admitting: Physician Assistant

## 2023-03-30 VITALS — BP 128/70 | Temp 99.0°F | Wt 216.0 lb

## 2023-03-30 DIAGNOSIS — G5601 Carpal tunnel syndrome, right upper limb: Secondary | ICD-10-CM

## 2023-03-30 DIAGNOSIS — Z9889 Other specified postprocedural states: Secondary | ICD-10-CM

## 2023-03-30 NOTE — Progress Notes (Signed)
   REFERRING PHYSICIAN:  Burtis Junes [address] [place],  [place] [address]  DOS: 02/17/23 Right ultrasound guided carpal tunnel release  HISTORY OF PRESENT ILLNESS: Frances Greene is 6 weeks status post right US guided CTR. Overall, she is doing okay.  She is frustrated that she still has tingling in her fingertips and some burning pains in her hands especially when she drives.  The tingling that was waking her up in the middle the night has stopped at this point.  PHYSICAL EXAMINATION:  NEUROLOGICAL:  General: In no acute distress.   Awake, alert, oriented to person, place, and time.  Pupils equal round and reactive to light.  Facial tone is symmetric.  Tongue protrusion is midline.  There is no pronator drift.   Motor Exam:  She has wasting of her thenar eminence, 4 out of 5 weakness in her lower muscles, persistent strength in her ulnar innervated lumbricals Incision c/d/I. No erythema or drainage  Imaging:  None  Assessment / Plan: Frances Greene is  6 weeks status post right US guided CTR. Overall, she is doing okay.  She is frustrated that she still has tingling in her fingertips and some burning pains in her hands especially when she drives.  The tingling that was waking her up in the middle the night has stopped at this point.  Her exam remains at baseline.  I understand her frustration with the tingling.  It is unilateral so less likely caused by her diabetes.  However we talked about some different ways that she can avoid putting pressure on her median nerve and potentially also use a brace on her right hand to help with her pain as well.  Of note, she does use a cane at all times which increases the pressure put on the nerve.  She is only 6 weeks out from surgery and her median nerve can still be recovering.  It is great news that her nighttime waking has stopped and the sign of a good decompression.  Plan to see back in approximately 6 weeks for an  update.    Advised to contact the office if any questions or concerns arise.   Joan Flores PA-C Dept of Neurosurgery

## 2023-04-06 ENCOUNTER — Ambulatory Visit: Payer: HMO

## 2023-04-12 ENCOUNTER — Telehealth: Payer: Self-pay

## 2023-04-14 ENCOUNTER — Other Ambulatory Visit: Payer: Self-pay | Admitting: Nurse Practitioner

## 2023-04-14 MED ORDER — SEMAGLUTIDE(0.25 OR 0.5MG/DOS) 2 MG/3ML ~~LOC~~ SOPN
0.5000 mg | PEN_INJECTOR | SUBCUTANEOUS | 5 refills | Status: DC
Start: 2023-04-14 — End: 2023-05-31

## 2023-04-14 NOTE — Telephone Encounter (Signed)
 Pt advised we sent ozempic used as once a week

## 2023-04-14 NOTE — Progress Notes (Signed)
 Lmom to call us back

## 2023-05-11 ENCOUNTER — Other Ambulatory Visit: Payer: HMO

## 2023-05-14 ENCOUNTER — Encounter: Payer: Self-pay | Admitting: Oncology

## 2023-05-18 ENCOUNTER — Encounter: Payer: Self-pay | Admitting: Physician Assistant

## 2023-05-18 ENCOUNTER — Ambulatory Visit (INDEPENDENT_AMBULATORY_CARE_PROVIDER_SITE_OTHER): Admitting: Physician Assistant

## 2023-05-18 VITALS — BP 134/62 | Ht 63.0 in | Wt 216.0 lb

## 2023-05-18 DIAGNOSIS — Z09 Encounter for follow-up examination after completed treatment for conditions other than malignant neoplasm: Secondary | ICD-10-CM

## 2023-05-18 DIAGNOSIS — G5601 Carpal tunnel syndrome, right upper limb: Secondary | ICD-10-CM

## 2023-05-18 NOTE — Progress Notes (Signed)
   REFERRING PHYSICIAN:  Ardella Beaver [address] [place],  [place] [address]  DOS: 02/17/23 Right ultrasound guided carpal tunnel release  HISTORY OF PRESENT ILLNESS: Frances Greene is 12 weeks status post right US  guided CTR. Overall, she is doing okay.  She is frustrated that she still has tingling in her fingertips and some burning pains in her hands especially when she drives.  The tingling that was waking her up in the middle the night has stopped at this point.  Right hand worse than left.  She states that she stopped using her brace at night because she feels as though it is doing more harm than good.  She is having trouble putting in and taking out her earrings and at times doing her buttons.  She denies any numbness and tingling in her feet.  She denies any neck pain that extends down her arm.  PHYSICAL EXAMINATION:  NEUROLOGICAL:  General: In no acute distress.   Awake, alert, oriented to person, place, and time.  Pupils equal round and reactive to light.  Facial tone is symmetric.     Motor Exam:  She has wasting of her thenar eminence, 4 out of 5 weakness in her lower muscles, persistent strength in her ulnar innervated lumbricals  Incision well healed.  Imaging:  None  Assessment / Plan: Frances Greene is 12 weeks status post right US  guided CTR. Overall, she is doing okay.  She is frustrated that she still has tingling in her fingertips and some burning pains in her hands especially when she drives.  The tingling that was waking her up in the middle the night has stopped at this point.  Right hand worse than left.  She states that she stopped using her brace at night because she feels as though it is doing more harm than good.  She is having trouble putting in and taking out her earrings and at times doing her buttons.  She denies any numbness and tingling in her feet.  She denies any neck pain that extends down her arm.Her exam remains at baseline.  I understand  her frustration with the tingling.  It is unilateral so less likely caused by her diabetes.    Of note, she does use a cane at all times which increases the pressure put on the nerve.  However, with patient having continued pain and tingling would like to rule out double crush phenomenon from a cervical radiculopathy.  Repeat EMG was ordered to evaluate for this.  Will review once complete.  If no indication of cervical involvement, would consider repeat carpal tunnel release via an open incision.     Advised to contact the office if any questions or concerns arise.   Ludwig Safer PA-C Dept of Neurosurgery

## 2023-05-19 ENCOUNTER — Encounter: Payer: Self-pay | Admitting: Oncology

## 2023-05-30 ENCOUNTER — Telehealth: Payer: Self-pay

## 2023-05-31 MED ORDER — LIRAGLUTIDE 18 MG/3ML ~~LOC~~ SOPN: 1.2000 mg | PEN_INJECTOR | Freq: Every day | SUBCUTANEOUS | 3 refills | Status: DC

## 2023-05-31 NOTE — Telephone Encounter (Signed)
 Patient notified

## 2023-06-28 DIAGNOSIS — Z9889 Other specified postprocedural states: Secondary | ICD-10-CM | POA: Diagnosis not present

## 2023-06-28 DIAGNOSIS — H401122 Primary open-angle glaucoma, left eye, moderate stage: Secondary | ICD-10-CM | POA: Diagnosis not present

## 2023-06-28 DIAGNOSIS — H35372 Puckering of macula, left eye: Secondary | ICD-10-CM | POA: Diagnosis not present

## 2023-06-28 DIAGNOSIS — H401113 Primary open-angle glaucoma, right eye, severe stage: Secondary | ICD-10-CM | POA: Diagnosis not present

## 2023-07-08 ENCOUNTER — Other Ambulatory Visit: Payer: Self-pay | Admitting: Nurse Practitioner

## 2023-07-08 DIAGNOSIS — Z79899 Other long term (current) drug therapy: Secondary | ICD-10-CM

## 2023-07-11 ENCOUNTER — Telehealth: Payer: Self-pay | Admitting: Nurse Practitioner

## 2023-07-11 NOTE — Telephone Encounter (Signed)
 Pt don't vitamin d  test please review

## 2023-07-13 NOTE — Telephone Encounter (Signed)
 Pt advised that we will recheck vitamin d  next visit

## 2023-07-25 ENCOUNTER — Telehealth: Payer: Self-pay

## 2023-07-25 ENCOUNTER — Other Ambulatory Visit: Payer: Self-pay

## 2023-07-25 MED ORDER — LIRAGLUTIDE 18 MG/3ML ~~LOC~~ SOPN: 1.2000 mg | PEN_INJECTOR | Freq: Every day | SUBCUTANEOUS | 3 refills | Status: DC

## 2023-07-25 NOTE — Telephone Encounter (Signed)
 Refilled Victoza , patient notified.

## 2023-07-28 DIAGNOSIS — I10 Essential (primary) hypertension: Secondary | ICD-10-CM | POA: Diagnosis not present

## 2023-07-28 DIAGNOSIS — N184 Chronic kidney disease, stage 4 (severe): Secondary | ICD-10-CM | POA: Diagnosis not present

## 2023-07-28 DIAGNOSIS — E210 Primary hyperparathyroidism: Secondary | ICD-10-CM | POA: Diagnosis not present

## 2023-07-28 DIAGNOSIS — E1122 Type 2 diabetes mellitus with diabetic chronic kidney disease: Secondary | ICD-10-CM | POA: Diagnosis not present

## 2023-08-01 DIAGNOSIS — E1122 Type 2 diabetes mellitus with diabetic chronic kidney disease: Secondary | ICD-10-CM | POA: Diagnosis not present

## 2023-08-01 DIAGNOSIS — I10 Essential (primary) hypertension: Secondary | ICD-10-CM | POA: Diagnosis not present

## 2023-08-01 DIAGNOSIS — N184 Chronic kidney disease, stage 4 (severe): Secondary | ICD-10-CM | POA: Diagnosis not present

## 2023-08-03 ENCOUNTER — Other Ambulatory Visit: Payer: Self-pay

## 2023-08-03 ENCOUNTER — Telehealth: Payer: Self-pay

## 2023-08-03 MED ORDER — LIRAGLUTIDE 18 MG/3ML ~~LOC~~ SOPN: 1.8000 mg | PEN_INJECTOR | Freq: Every day | SUBCUTANEOUS | 3 refills | Status: DC

## 2023-08-03 NOTE — Telephone Encounter (Signed)
 Spoke with pt that she is tolerating well victoza  1.8 mg as per change direction and sent pres

## 2023-08-20 ENCOUNTER — Other Ambulatory Visit: Payer: Self-pay | Admitting: Nurse Practitioner

## 2023-08-20 DIAGNOSIS — Z79899 Other long term (current) drug therapy: Secondary | ICD-10-CM

## 2023-08-22 ENCOUNTER — Other Ambulatory Visit: Payer: Self-pay

## 2023-08-22 DIAGNOSIS — Z79899 Other long term (current) drug therapy: Secondary | ICD-10-CM

## 2023-08-22 MED ORDER — CITALOPRAM HYDROBROMIDE 10 MG PO TABS: 10.0000 mg | ORAL_TABLET | Freq: Every day | ORAL | 1 refills | Status: AC

## 2023-09-14 ENCOUNTER — Ambulatory Visit (INDEPENDENT_AMBULATORY_CARE_PROVIDER_SITE_OTHER): Payer: HMO | Admitting: Nurse Practitioner

## 2023-09-14 ENCOUNTER — Other Ambulatory Visit: Payer: Self-pay | Admitting: Nurse Practitioner

## 2023-09-14 ENCOUNTER — Encounter: Payer: Self-pay | Admitting: Nurse Practitioner

## 2023-09-14 VITALS — BP 135/70 | HR 66 | Temp 98.3°F | Resp 16 | Ht 63.0 in | Wt 212.0 lb

## 2023-09-14 DIAGNOSIS — E2839 Other primary ovarian failure: Secondary | ICD-10-CM

## 2023-09-14 DIAGNOSIS — E1122 Type 2 diabetes mellitus with diabetic chronic kidney disease: Secondary | ICD-10-CM | POA: Diagnosis not present

## 2023-09-14 DIAGNOSIS — R928 Other abnormal and inconclusive findings on diagnostic imaging of breast: Secondary | ICD-10-CM

## 2023-09-14 DIAGNOSIS — D509 Iron deficiency anemia, unspecified: Secondary | ICD-10-CM

## 2023-09-14 DIAGNOSIS — G4762 Sleep related leg cramps: Secondary | ICD-10-CM

## 2023-09-14 DIAGNOSIS — Z79899 Other long term (current) drug therapy: Secondary | ICD-10-CM | POA: Diagnosis not present

## 2023-09-14 DIAGNOSIS — N1832 Chronic kidney disease, stage 3b: Secondary | ICD-10-CM | POA: Diagnosis not present

## 2023-09-14 DIAGNOSIS — I129 Hypertensive chronic kidney disease with stage 1 through stage 4 chronic kidney disease, or unspecified chronic kidney disease: Secondary | ICD-10-CM

## 2023-09-14 DIAGNOSIS — E1169 Type 2 diabetes mellitus with other specified complication: Secondary | ICD-10-CM | POA: Diagnosis not present

## 2023-09-14 DIAGNOSIS — E785 Hyperlipidemia, unspecified: Secondary | ICD-10-CM | POA: Diagnosis not present

## 2023-09-14 DIAGNOSIS — Z0000 Encounter for general adult medical examination without abnormal findings: Secondary | ICD-10-CM

## 2023-09-14 LAB — POCT GLYCOSYLATED HEMOGLOBIN (HGB A1C): Hemoglobin A1C: 6.3 % — AB (ref 4.0–5.6)

## 2023-09-14 MED ORDER — CHLORHEXIDINE GLUCONATE 0.12 % MT SOLN: OROMUCOSAL | 12 refills | Status: AC

## 2023-09-14 MED ORDER — HYDRALAZINE HCL 25 MG PO TABS
ORAL_TABLET | ORAL | 2 refills | Status: AC
Start: 2023-09-14 — End: ?

## 2023-09-14 MED ORDER — FOLIC ACID 1 MG PO TABS
1.0000 mg | ORAL_TABLET | Freq: Every day | ORAL | 1 refills | Status: AC
Start: 2023-09-14 — End: ?

## 2023-09-14 MED ORDER — TIZANIDINE HCL 2 MG PO CAPS: 2.0000 mg | ORAL_CAPSULE | Freq: Every evening | ORAL | 1 refills | Status: AC | PRN

## 2023-09-14 MED ORDER — VALACYCLOVIR HCL 500 MG PO TABS: 500.0000 mg | ORAL_TABLET | Freq: Every day | ORAL | 1 refills | Status: AC

## 2023-09-14 MED ORDER — EMPAGLIFLOZIN 10 MG PO TABS: 10.0000 mg | ORAL_TABLET | Freq: Every day | ORAL | 1 refills | Status: AC

## 2023-09-14 MED ORDER — BISOPROLOL FUMARATE 5 MG PO TABS: 5.0000 mg | ORAL_TABLET | Freq: Every day | ORAL | 1 refills | Status: AC

## 2023-09-14 MED ORDER — MELOXICAM 7.5 MG PO TABS: ORAL_TABLET | ORAL | 1 refills | Status: AC

## 2023-09-14 MED ORDER — FLUTICASONE-SALMETEROL 100-50 MCG/ACT IN AEPB: 1.0000 | INHALATION_SPRAY | Freq: Two times a day (BID) | RESPIRATORY_TRACT | 3 refills | Status: DC

## 2023-09-14 MED ORDER — ATORVASTATIN CALCIUM 10 MG PO TABS
10.0000 mg | ORAL_TABLET | Freq: Every day | ORAL | 2 refills | Status: AC
Start: 2023-09-14 — End: ?

## 2023-09-14 MED ORDER — MONTELUKAST SODIUM 10 MG PO TABS: 10.0000 mg | ORAL_TABLET | Freq: Every evening | ORAL | 3 refills | Status: AC

## 2023-09-14 MED ORDER — FEBUXOSTAT 40 MG PO TABS: 40.0000 mg | ORAL_TABLET | Freq: Every day | ORAL | 1 refills | Status: AC

## 2023-09-14 MED ORDER — FUROSEMIDE 80 MG PO TABS: 80.0000 mg | ORAL_TABLET | Freq: Every morning | ORAL | 1 refills | Status: AC

## 2023-09-14 NOTE — Progress Notes (Signed)
 [facility] [address] [place], [place] [address]  Internal MEDICINE  Office Visit Note  Patient Name: Frances Greene  [id number]  [id number]  Date of Service: 09/14/2023  Chief Complaint  Patient presents with   Diabetes   Hypertension   Medicare Wellness    HPI Carolena presents for a medicare annual wellness visit.  Well-appearing 78 y.o. female with heart failure, allergic rhinitis, diabetes, hypertension, CKD stage 3 B, back pain, carpal tunnel, OSA, high cholesterol, insomnia, low B12 and low vitamin D  and anemia, depression.  Routine CRC screening: aged out, discontinued.  Routine mammogram: due for diagnostic mammogram in September.  DEXA scan: due for routine dexa scan  Pap smear:aged out, discontinued  Eye exam: done in December  foot exam: sees podiatry.  Labs: routine labs due  New or worsening pain: none  Other concerns: none, reports that she is doing well and feels good Her A1c is improved to 6.3 today and is stable.      09/14/2023    1:51 PM 09/13/2022    2:11 PM 09/07/2021    2:17 PM  MMSE - Mini Mental State Exam  Orientation to time 5 5 5   Orientation to Place 5 5 5   Registration 3 3 3   Attention/ Calculation 5 5 5   Recall 3 3 3   Language- name 2 objects 2 2 2   Language- repeat 1 1 1   Language- follow 3 step command 3 3 3   Language- read & follow direction 1 1 1   Write a sentence 1 1 1   Copy design 1 1 1   Total score 30 30 30     Functional Status Survey: Is the patient deaf or have difficulty hearing?: No Does the patient have difficulty seeing, even when wearing glasses/contacts?: No Does the patient have difficulty concentrating, remembering, or making decisions?: No Does the patient have difficulty walking or climbing stairs?: Yes Does the patient have difficulty dressing or bathing?: No Does the patient have difficulty doing errands alone such as visiting a doctor's office or shopping?: No     09/07/2021    2:19 PM  10/06/2021    2:44 PM 04/08/2022    2:45 PM 09/13/2022    2:10 PM 09/14/2023    1:50 PM  Fall Risk  Falls in the past year? 0 0 0 0 0  Was there an injury with Fall?  0 0 0 0  Fall Risk Category Calculator  0 0 0 0  Fall Risk Category (Retired)  Low      (RETIRED) Patient Fall Risk Level Low fall risk  Low fall risk      Patient at Risk for Falls Due to No Fall Risks No Fall Risks No Fall Risks No Fall Risks No Fall Risks  Fall risk Follow up Falls evaluation completed  Falls evaluation completed  Falls evaluation completed Falls evaluation completed Falls evaluation completed     Data saved with a previous flowsheet row definition       09/14/2023    1:50 PM  Depression screen PHQ 2/9  Decreased Interest 0  Down, Depressed, Hopeless 0  PHQ - 2 Score 0      Current Medication: Outpatient Encounter Medications as of 09/14/2023  Medication Sig   chlorhexidine  (PERIDEX ) 0.12 % solution Swish and spit 5 ml daily for 30 seconds   acetaminophen  (TYLENOL ) 325 MG tablet Take 650 mg by mouth every 6 (six) hours as needed.   atorvastatin  (LIPITOR) 10 MG tablet Take 1 tablet (  10 mg total) by mouth daily.   betamethasone  dipropionate 0.05 % cream Apply topically 2 (two) times daily. (Patient taking differently: Apply 1 Application topically 2 (two) times daily as needed.)   bisoprolol  (ZEBETA ) 5 MG tablet Take 1 tablet (5 mg total) by mouth daily.   Blood Glucose Monitoring Suppl (ONETOUCH VERIO REFLECT) w/Device KIT Use as directed for once daily Dx E11.65   Cholecalciferol (VITAMIN D3) 1.25 MG (50000 UT) CAPS Take 1 capsule (1.25 mg total) by mouth once a week. (Patient taking differently: Take 1 capsule by mouth once a week. Monday)   cinacalcet (SENSIPAR) 30 MG tablet 30 mg daily with breakfast.   citalopram  (CELEXA ) 10 MG tablet Take 1 tablet (10 mg total) by mouth daily.   cyanocobalamin  (VITAMIN B12) 100 MCG tablet Take 100 mcg by mouth daily.   empagliflozin  (JARDIANCE ) 10 MG TABS  tablet Take 1 tablet (10 mg total) by mouth daily.   febuxostat  (ULORIC ) 40 MG tablet Take 1 tablet (40 mg total) by mouth daily.   ferrous sulfate 325 (65 FE) MG tablet Take 325 mg by mouth daily with breakfast.   fluticasone -salmeterol (ADVAIR) 100-50 MCG/ACT AEPB Inhale 1 puff into the lungs 2 (two) times daily.   folic acid  (FOLVITE ) 1 MG tablet Take 1 tablet (1 mg total) by mouth daily.   furosemide  (LASIX ) 80 MG tablet Take 1 tablet (80 mg total) by mouth every morning.   Garlic 1000 MG CAPS Take 1 capsule by mouth daily.   glucose blood (ONETOUCH VERIO) test strip Use as directed once a daily   hydrALAZINE  (APRESOLINE ) 25 MG tablet TAKE ONE TABLET BY MOUTH THREE TIMES DAILY   Insulin Pen Needle (COMFORT EZ PEN NEEDLES) 32G X 4 MM MISC Use with victoza  injections daily   ketoconazole  (NIZORAL ) 2 % shampoo SHAMPOO AS DIRECTED   latanoprost (XALATAN) 0.005 % ophthalmic solution latanoprost 0.005 % eye drops  INSTILL 1 DROP INTO EACH EYE IN THE EVENING   liraglutide  (VICTOZA ) 18 MG/3ML SOPN Inject 1.8 mg into the skin daily.   meloxicam  (MOBIC ) 7.5 MG tablet TAKE 1 TABLET BY MOUTH ONCE DAILY FOR  GOUT  AND  ARTHRITIS   mirabegron  ER (MYRBETRIQ ) 50 MG TB24 tablet Take 1 tablet (50 mg total) by mouth daily.   montelukast  (SINGULAIR ) 10 MG tablet Take 1 tablet (10 mg total) by mouth every evening.   NON FORMULARY CPAP pt uses [facility] for CPAP   Omega-3 Fatty Acids (FISH OIL PO) Take 1,000 mg by mouth daily.   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   tizanidine  (ZANAFLEX ) 2 MG capsule Take 1 capsule (2 mg total) by mouth at bedtime as needed for muscle spasms.   valACYclovir  (VALTREX ) 500 MG tablet Take 1 tablet (500 mg total) by mouth daily.   [DISCONTINUED] atorvastatin  (LIPITOR) 10 MG tablet Take 1 tablet (10 mg total) by mouth daily.   [DISCONTINUED] bisoprolol  (ZEBETA ) 5 MG tablet  Take 1 tablet (5 mg total) by mouth daily.   [DISCONTINUED] empagliflozin  (JARDIANCE ) 10 MG TABS tablet Take 1 tablet (10 mg total) by mouth daily.   [DISCONTINUED] febuxostat  (ULORIC ) 40 MG tablet Take 1 tablet (40 mg total) by mouth daily.   [DISCONTINUED] fluticasone -salmeterol (ADVAIR) 100-50 MCG/ACT AEPB Inhale 1 puff into the lungs 2 (two) times daily.   [DISCONTINUED] folic acid  (FOLVITE ) 1 MG tablet Take 1 tablet (1 mg  total) by mouth daily.   [DISCONTINUED] furosemide  (LASIX ) 80 MG tablet Take 1 tablet (80 mg total) by mouth every morning.   [DISCONTINUED] hydrALAZINE  (APRESOLINE ) 25 MG tablet TAKE ONE TABLET BY MOUTH THREE TIMES DAILY   [DISCONTINUED] meloxicam  (MOBIC ) 7.5 MG tablet TAKE 1 TABLET BY MOUTH ONCE DAILY FOR  GOUT  AND  ARTHRITIS   [DISCONTINUED] montelukast  (SINGULAIR ) 10 MG tablet Take 1 tablet (10 mg total) by mouth every evening. (Patient taking differently: Take 10 mg by mouth daily as needed.)   [DISCONTINUED] tizanidine  (ZANAFLEX ) 2 MG capsule Take one tab po qhs for spasm (Patient taking differently: Take 2 mg by mouth at bedtime as needed for muscle spasms. Take one tab po qhs for spasm)   [DISCONTINUED] valACYclovir  (VALTREX ) 500 MG tablet Take 1 tablet (500 mg total) by mouth daily.   No facility-administered encounter medications on file as of 09/14/2023.    Surgical History: Past Surgical History:  Procedure Laterality Date   ABDOMINAL HYSTERECTOMY     BREAST BIOPSY Left 1989   neg   CARPAL TUNNEL RELEASE Right 02/17/2023   Procedure: RIGHT CARPAL TUNNEL RELEASE WITH ULTRASOUND GUIDANCE;  Surgeon: Claudene Penne ORN, MD;  Location: [facility];  Service: Neurosurgery;  Laterality: Right;   CATARACT EXTRACTION W/PHACO Right 05/21/2015   Procedure: CATARACT EXTRACTION PHACO AND INTRAOCULAR LENS PLACEMENT (IOC);  Surgeon: Harlene Scarce, MD;  Location: [facility];  Service: Ophthalmology;  Laterality: Right;  US : AP%: 10.0 CDE: 9.53   CATARACT EXTRACTION W/PHACO  Left 12/31/2020   Procedure: CATARACT EXTRACTION PHACO AND INTRAOCULAR LENS PLACEMENT (IOC) LEFT DIABETIC 4.13 01:00.9;  Surgeon: Mittie Gaskin, MD;  Location: [facility];  Service: Ophthalmology;  Laterality: Left;   COLONOSCOPY     MEDIAL PARTIAL KNEE REPLACEMENT Bilateral 08/13/2013   MAKOplasty   PARS PLANA VITRECTOMY Right 05/21/2015   Procedure: PARS PLANA VITRECTOMY WITH 25 GAUGE;  Surgeon: Harlene Scarce, MD;  Location: [facility];  Service: Ophthalmology;  Laterality: Right;  Lot # [id number] Endo laser: Watts: 300 Pulse Duration: 200 Total Pulses:143    SHOULDER ARTHROSCOPY Right    bone spur    Medical History: Past Medical History:  Diagnosis Date   Anemia in stage 3b chronic kidney disease (HCC)    Arthritis    Asthma    Benign essential hypertension    Bronchitis    CHF (congestive heart failure) (HCC)    Chronic kidney disease, stage 4 (severe) (HCC)    decreased kidney function   Degenerative joint disease    Gout    Heart murmur    Iron deficiency anemia    Multiple allergies    Obstructive sleep apnea on CPAP    Primary hyperparathyroidism (HCC)    Shortness of breath dyspnea    w/ exertion   Sickle cell trait (HCC)    Spinal stenosis of cervical region    Swelling of both lower extremities    Type 2 diabetes mellitus with chronic kidney disease (HCC)    Wheezing     Family History: Family History  Problem Relation Age of Onset   Breast cancer Cousin    Breast cancer Other    Hypertension Mother    Diabetes Mother    Hypertension Father    Diabetes Father    Alcohol  abuse Father    Aneurysm Sister    Alcohol  abuse Sister    Stroke Sister        died from stroke   Hepatitis Brother    Hypertension  Sister        dialysis   Diabetes Sister    Alcoholism Brother    Alcoholism Brother        dies from head injury from a fall.  not an alcoholic,   Cancer Brother    Diabetes Brother    Hypertension Brother    Diabetes Brother     Cancer Brother    Hypertension Brother    Stroke Brother    Alcoholism Brother     Social History   Socioeconomic History   Marital status: Divorced    Spouse name: Not on file   Number of children: 1   Years of education: Not on file   Highest education level: Not on file  Occupational History   Not on file  Tobacco Use   Smoking status: Former    Types: Cigarettes   Smokeless tobacco: Never   Tobacco comments:    4 cigarettes quit 20 years ago  Vaping Use   Vaping status: Never Used  Substance and Sexual Activity   Alcohol  use: Not Currently   Drug use: Never   Sexual activity: Not on file  Other Topics Concern   Not on file  Social History Narrative   Lives alone   Social Drivers of Health   Financial Resource Strain: Medium Risk (06/01/2021)   Received from [facility]   Overall Financial Resource Strain (CARDIA)    Difficulty of Paying Living Expenses: Somewhat hard  Food Insecurity: No Food Insecurity (03/11/2022)   Received from [facility]   Hunger Vital Sign    Within the past 12 months, you worried that your food would run out before you got the money to buy more.: Never true    Within the past 12 months, the food you bought just didn't last and you didn't have money to get more.: Never true  Transportation Needs: No Transportation Needs (03/11/2022)   Received from [facility]   PRAPARE - Transportation    Lack of Transportation (Medical): No    Lack of Transportation (Non-Medical): No  Physical Activity: Not on file  Stress: Not on file  Social Connections: Not on file  Intimate Partner Violence: Not on file      Review of Systems  Constitutional:  Negative for chills, fatigue and unexpected weight change.  HENT:  Negative for congestion, postnasal drip, rhinorrhea, sneezing and sore throat.   Eyes:  Negative for redness.  Respiratory:  Negative for cough, chest tightness and shortness of breath.   Cardiovascular:  Negative for  chest pain and palpitations.  Gastrointestinal:  Negative for abdominal pain, constipation, diarrhea, nausea and vomiting.  Genitourinary:  Negative for dysuria and frequency.  Musculoskeletal:  Negative for arthralgias, back pain, joint swelling and neck pain.  Skin:  Negative for rash.  Neurological: Negative.  Negative for tremors and numbness.  Hematological:  Negative for adenopathy. Does not bruise/bleed easily.  Psychiatric/Behavioral:  Negative for behavioral problems (Depression), sleep disturbance and suicidal ideas. The patient is not nervous/anxious.     Vital Signs: BP 135/70   Pulse 66   Temp 98.3 F (36.8 C)   Resp 16   Ht 5' 3 (1.6 m)   Wt 212 lb (96.2 kg)   SpO2 95%   BMI 37.55 kg/m    Physical Exam Vitals and nursing note reviewed.  Constitutional:      General: She is not in acute distress.    Appearance: Normal appearance. She is well-developed. She is obese. She  is not diaphoretic.  HENT:     Head: Normocephalic and atraumatic.     Mouth/Throat:     Pharynx: No oropharyngeal exudate.  Eyes:     Pupils: Pupils are equal, round, and reactive to light.  Neck:     Thyroid : No thyromegaly.     Vascular: No JVD.     Trachea: No tracheal deviation.  Cardiovascular:     Rate and Rhythm: Normal rate and regular rhythm.     Heart sounds: Normal heart sounds. No murmur heard.    No friction rub. No gallop.  Pulmonary:     Effort: Pulmonary effort is normal. No respiratory distress.     Breath sounds: No wheezing or rales.  Chest:     Chest wall: No tenderness.  Abdominal:     General: Bowel sounds are normal.     Palpations: Abdomen is soft.     Tenderness: There is no abdominal tenderness.  Musculoskeletal:        General: Normal range of motion.     Cervical back: Normal range of motion and neck supple.  Lymphadenopathy:     Cervical: No cervical adenopathy.  Skin:    General: Skin is warm and dry.  Neurological:     Mental Status: She is alert  and oriented to person, place, and time.     Cranial Nerves: No cranial nerve deficit.  Psychiatric:        Behavior: Behavior normal.        Thought Content: Thought content normal.        Judgment: Judgment normal.        Assessment/Plan: 1. Encounter for subsequent annual wellness visit (AWV) in Medicare patient (Primary) Age-appropriate preventive screenings and vaccinations discussed. Routine labs for health maintenance will be ordered. PHM updated.    2. Type 2 diabetes mellitus with stage 3b chronic kidney disease, without long-term current use of insulin (HCC) A1c is stable and slightly improved at 6.3 today from 6.5 in February this year. Continue jardiance  and other medications as prescribed.  - POCT glycosylated hemoglobin (Hb A1C) - empagliflozin  (JARDIANCE ) 10 MG TABS tablet; Take 1 tablet (10 mg total) by mouth daily.  Dispense: 90 tablet; Refill: 1  3. Hypertension associated with stage 3b chronic kidney disease due to type 2 diabetes mellitus (HCC) Stable, continue medications as prescribed, refills ordered  - bisoprolol  (ZEBETA ) 5 MG tablet; Take 1 tablet (5 mg total) by mouth daily.  Dispense: 90 tablet; Refill: 1 - furosemide  (LASIX ) 80 MG tablet; Take 1 tablet (80 mg total) by mouth every morning.  Dispense: 90 tablet; Refill: 1 - hydrALAZINE  (APRESOLINE ) 25 MG tablet; TAKE ONE TABLET BY MOUTH THREE TIMES DAILY  Dispense: 270 tablet; Refill: 2  4. Hyperlipidemia associated with type 2 diabetes mellitus (HCC) Continue atorvastatin  as prescribed.  - atorvastatin  (LIPITOR) 10 MG tablet; Take 1 tablet (10 mg total) by mouth daily.  Dispense: 90 tablet; Refill: 2  5. Iron deficiency anemia, unspecified iron deficiency anemia type Continue folic acid  as prescribed.  - folic acid  (FOLVITE ) 1 MG tablet; Take 1 tablet (1 mg total) by mouth daily.  Dispense: 90 tablet; Refill: 1  6. Sleep related leg cramps Continue tizanidine  as prescribed.  - tizanidine  (ZANAFLEX ) 2  MG capsule; Take 1 capsule (2 mg total) by mouth at bedtime as needed for muscle spasms.  Dispense: 90 capsule; Refill: 1  7. 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  Abnormal mammogram Diagnostic mammogram and ultrasound ordered  - MM 3D DIAGNOSTIC MAMMOGRAM BILATERAL BREAST; Future - US  LIMITED ULTRASOUND INCLUDING AXILLA LEFT BREAST ; Future  9. Encounter for medication review Medication list reviewed, updated and refills ordered  - febuxostat  (ULORIC ) 40 MG tablet; Take 1 tablet (40 mg total) by mouth daily.  Dispense: 90 tablet; Refill: 1 - fluticasone -salmeterol (ADVAIR) 100-50 MCG/ACT AEPB; Inhale 1 puff into the lungs 2 (two) times daily.  Dispense: 60 each; Refill: 3 - meloxicam  (MOBIC ) 7.5 MG tablet; TAKE 1 TABLET BY MOUTH ONCE DAILY FOR  GOUT  AND  ARTHRITIS  Dispense: 90 tablet; Refill: 1 - montelukast  (SINGULAIR ) 10 MG tablet; Take 1 tablet (10 mg total) by mouth every evening.  Dispense: 90 tablet; Refill: 3 - valACYclovir  (VALTREX ) 500 MG tablet; Take 1 tablet (500 mg total) by mouth daily.  Dispense: 90 tablet; Refill: 1 - chlorhexidine  (PERIDEX ) 0.12 % solution; Swish and spit 5 ml daily for 30 seconds  Dispense: 473 mL; Refill: 12    General Counseling: Rettie verbalizes understanding of the findings of todays visit and agrees with plan of treatment. I have discussed any further diagnostic evaluation that may be needed or ordered today. We also reviewed her medications today. she has been encouraged to call the office with any questions or concerns that should arise related to todays visit.    Orders Placed This Encounter  Procedures   MM 3D DIAGNOSTIC MAMMOGRAM BILATERAL BREAST   US  LIMITED ULTRASOUND INCLUDING AXILLA LEFT BREAST    DG Bone Density   POCT glycosylated hemoglobin (Hb A1C)    Meds ordered this encounter  Medications   atorvastatin  (LIPITOR) 10 MG tablet    Sig: Take 1 tablet (10 mg total) by mouth daily.     Dispense:  90 tablet    Refill:  2    For future refills, keep on file   bisoprolol  (ZEBETA ) 5 MG tablet    Sig: Take 1 tablet (5 mg total) by mouth daily.    Dispense:  90 tablet    Refill:  1    For future refills, keep on file   empagliflozin  (JARDIANCE ) 10 MG TABS tablet    Sig: Take 1 tablet (10 mg total) by mouth daily.    Dispense:  90 tablet    Refill:  1    For future refills, keep on file   febuxostat  (ULORIC ) 40 MG tablet    Sig: Take 1 tablet (40 mg total) by mouth daily.    Dispense:  90 tablet    Refill:  1    For future refills, keep on file   fluticasone -salmeterol (ADVAIR) 100-50 MCG/ACT AEPB    Sig: Inhale 1 puff into the lungs 2 (two) times daily.    Dispense:  60 each    Refill:  3   folic acid  (FOLVITE ) 1 MG tablet    Sig: Take 1 tablet (1 mg total) by mouth daily.    Dispense:  90 tablet    Refill:  1    For future refills, keep on file   furosemide  (LASIX ) 80 MG tablet    Sig: Take 1 tablet (80 mg total) by mouth every morning.    Dispense:  90 tablet    Refill:  1    For future refills, keep on file   hydrALAZINE  (APRESOLINE ) 25 MG tablet    Sig: TAKE ONE TABLET BY MOUTH THREE TIMES DAILY    Dispense:  270 tablet  Refill:  2    For future refills, keep on file   meloxicam  (MOBIC ) 7.5 MG tablet    Sig: TAKE 1 TABLET BY MOUTH ONCE DAILY FOR  GOUT  AND  ARTHRITIS    Dispense:  90 tablet    Refill:  1    For future refills, keep on file   montelukast  (SINGULAIR ) 10 MG tablet    Sig: Take 1 tablet (10 mg total) by mouth every evening.    Dispense:  90 tablet    Refill:  3   tizanidine  (ZANAFLEX ) 2 MG capsule    Sig: Take 1 capsule (2 mg total) by mouth at bedtime as needed for muscle spasms.    Dispense:  90 capsule    Refill:  1   valACYclovir  (VALTREX ) 500 MG tablet    Sig: Take 1 tablet (500 mg total) by mouth daily.    Dispense:  90 tablet    Refill:  1    For future refills, keep on file   chlorhexidine  (PERIDEX ) 0.12 % solution     Sig: Swish and spit 5 ml daily for 30 seconds    Dispense:  473 mL    Refill:  12    Fill new script today    Return in about 6 months (around 03/16/2024) for F/U, Sera Hitsman PCP.   Total time spent:30 Minutes Time spent includes review of chart, medications, test results, and follow up plan with the patient.   [place] Controlled Substance Database was reviewed by me.  This patient was seen by Mardy Maxin, FNP-C in collaboration with Dr. Sigrid Bathe as a part of collaborative care agreement.  Suhana Wilner R. Maxin, MSN, FNP-C Internal medicine

## 2023-09-16 ENCOUNTER — Encounter: Payer: Self-pay | Admitting: Nurse Practitioner

## 2023-09-27 ENCOUNTER — Ambulatory Visit: Admitting: Neurology

## 2023-09-27 ENCOUNTER — Other Ambulatory Visit: Payer: Self-pay | Admitting: Nurse Practitioner

## 2023-09-27 DIAGNOSIS — E782 Mixed hyperlipidemia: Secondary | ICD-10-CM | POA: Diagnosis not present

## 2023-09-27 DIAGNOSIS — E559 Vitamin D deficiency, unspecified: Secondary | ICD-10-CM | POA: Diagnosis not present

## 2023-09-27 DIAGNOSIS — E538 Deficiency of other specified B group vitamins: Secondary | ICD-10-CM | POA: Diagnosis not present

## 2023-09-28 LAB — LIPID PANEL
Chol/HDL Ratio: 2.2 ratio (ref 0.0–4.4)
Cholesterol, Total: 157 mg/dL (ref 100–199)
HDL: 71 mg/dL (ref >39)
LDL Chol Calc (NIH): 70 mg/dL (ref 0–99)
Triglycerides: 85 mg/dL (ref 0–149)
VLDL Cholesterol Cal: 16 mg/dL (ref 5–40)

## 2023-09-28 LAB — B12 AND FOLATE PANEL
Folate: >20 ng/mL (ref >3.0)
Vitamin B-12: >2000 pg/mL — AB (ref 232–1245)

## 2023-09-28 LAB — VITAMIN D 25 HYDROXY (VIT D DEFICIENCY, FRACTURES): Vit D, 25-Hydroxy: 39.9 ng/mL (ref 30.0–100.0)

## 2023-10-05 ENCOUNTER — Encounter: Payer: Self-pay | Admitting: Oncology

## 2023-10-11 NOTE — Progress Notes (Signed)
   10/11/2023  Patient ID: Frances Greene, female   DOB: March 05, 1945, 78 y.o.   MRN: [id number]  Pharmacy Quality Measure Review  This patient is appearing on a report for being at risk of failing the adherence measure for diabetes medications this calendar year.   Medication: Jardiance  Last fill date: 07/17/23 for 90 day supply  Medication: Victoza  Last fill date: 10/03/23 for 60 day supply  Insurance report was not up to date. No action needed at this time.   Jon VEAR Lindau, PharmD Clinical Pharmacist [phone]

## 2023-10-12 ENCOUNTER — Inpatient Hospital Stay: Admission: RE | Admit: 2023-10-12 | Source: Ambulatory Visit

## 2023-10-25 NOTE — Progress Notes (Signed)
   10/25/2023  Patient ID: Frances Greene, female   DOB: [birth year], 78 y.o.   MRN: [id number]  Pharmacy Quality Measure Review  This patient is appearing on a report for being at risk of failing the adherence measure for diabetes medications this calendar year.   Medication: Jardiance  Last fill date: 10/10/23 for 90 day supply  Insurance report was not up to date. No action needed at this time.   Jon VEAR Lindau, PharmD Clinical Pharmacist [phone]

## 2023-10-26 ENCOUNTER — Ambulatory Visit
Admission: RE | Admit: 2023-10-26 | Discharge: 2023-10-26 | Disposition: A | Discharge status: Home / Self Care | Payer: Self-pay | Source: Ambulatory Visit | Attending: Nurse Practitioner | Admitting: Nurse Practitioner

## 2023-10-26 ENCOUNTER — Encounter: Payer: Self-pay | Admitting: Oncology

## 2023-10-26 ENCOUNTER — Ambulatory Visit
Admission: RE | Admit: 2023-10-26 | Discharge: 2023-10-26 | Disposition: A | Discharge status: Home / Self Care | Source: Ambulatory Visit | Attending: Nurse Practitioner | Admitting: Nurse Practitioner

## 2023-10-26 DIAGNOSIS — R928 Other abnormal and inconclusive findings on diagnostic imaging of breast: Secondary | ICD-10-CM | POA: Insufficient documentation

## 2023-10-26 DIAGNOSIS — R92333 Mammographic heterogeneous density, bilateral breasts: Secondary | ICD-10-CM | POA: Diagnosis not present

## 2023-11-07 ENCOUNTER — Telehealth: Payer: Self-pay

## 2023-11-07 NOTE — Telephone Encounter (Signed)
 Pt called that she is going to [facility] for her arm pain

## 2023-11-08 DIAGNOSIS — G5603 Carpal tunnel syndrome, bilateral upper limbs: Secondary | ICD-10-CM | POA: Diagnosis not present

## 2023-11-09 ENCOUNTER — Ambulatory Visit: Admitting: Nurse Practitioner

## 2023-11-11 ENCOUNTER — Other Ambulatory Visit: Payer: HMO

## 2023-11-11 ENCOUNTER — Ambulatory Visit: Payer: HMO | Admitting: Oncology

## 2023-11-30 DIAGNOSIS — N1832 Chronic kidney disease, stage 3b: Secondary | ICD-10-CM | POA: Diagnosis not present

## 2023-11-30 DIAGNOSIS — E1122 Type 2 diabetes mellitus with diabetic chronic kidney disease: Secondary | ICD-10-CM | POA: Diagnosis not present

## 2023-11-30 DIAGNOSIS — I5032 Chronic diastolic (congestive) heart failure: Secondary | ICD-10-CM | POA: Diagnosis not present

## 2023-11-30 DIAGNOSIS — I10 Essential (primary) hypertension: Secondary | ICD-10-CM | POA: Diagnosis not present

## 2023-11-30 DIAGNOSIS — R809 Proteinuria, unspecified: Secondary | ICD-10-CM | POA: Diagnosis not present

## 2023-11-30 DIAGNOSIS — E210 Primary hyperparathyroidism: Secondary | ICD-10-CM | POA: Diagnosis not present

## 2023-12-06 NOTE — Progress Notes (Signed)
   12/06/2023  Patient ID: Frances Greene [place], female   DOB: 1945/03/20, 78 y.o.   MRN: [id number]  Pharmacy Quality Measure Review  This patient is appearing on a report for being at risk of failing the adherence measure for cholesterol (statin) medications this calendar year.   Medication: Atorvastatin  Last fill date: 11/27/23 for 90 day supply  Insurance report was not up to date. No action needed at this time.   Jon VEAR Lindau, PharmD Clinical Pharmacist [phone]

## 2023-12-12 ENCOUNTER — Telehealth: Payer: Self-pay

## 2023-12-12 DIAGNOSIS — E1122 Type 2 diabetes mellitus with diabetic chronic kidney disease: Secondary | ICD-10-CM

## 2023-12-13 MED ORDER — TIRZEPATIDE 2.5 MG/0.5ML ~~LOC~~ SOAJ
2.5000 mg | SUBCUTANEOUS | 1 refills | Status: AC
Start: 2023-12-13 — End: ?

## 2023-12-13 NOTE — Telephone Encounter (Signed)
 Patient was notified.

## 2023-12-30 ENCOUNTER — Ambulatory Visit: Admitting: Nurse Practitioner

## 2024-01-04 ENCOUNTER — Telehealth: Payer: Self-pay

## 2024-01-04 NOTE — Telephone Encounter (Signed)
Faxed [facility] for diabetic shoe  

## 2024-01-05 ENCOUNTER — Encounter: Payer: Self-pay | Admitting: Nurse Practitioner

## 2024-01-05 ENCOUNTER — Ambulatory Visit (INDEPENDENT_AMBULATORY_CARE_PROVIDER_SITE_OTHER): Admitting: Nurse Practitioner

## 2024-01-05 VITALS — BP 110/60 | HR 71 | Temp 96.4°F | Resp 16 | Ht 63.0 in | Wt 221.6 lb

## 2024-01-05 DIAGNOSIS — E785 Hyperlipidemia, unspecified: Secondary | ICD-10-CM

## 2024-01-05 DIAGNOSIS — E1169 Type 2 diabetes mellitus with other specified complication: Secondary | ICD-10-CM

## 2024-01-05 DIAGNOSIS — I129 Hypertensive chronic kidney disease with stage 1 through stage 4 chronic kidney disease, or unspecified chronic kidney disease: Secondary | ICD-10-CM | POA: Diagnosis not present

## 2024-01-05 DIAGNOSIS — E1122 Type 2 diabetes mellitus with diabetic chronic kidney disease: Secondary | ICD-10-CM

## 2024-01-05 DIAGNOSIS — L602 Onychogryphosis: Secondary | ICD-10-CM | POA: Diagnosis not present

## 2024-01-05 DIAGNOSIS — N1832 Chronic kidney disease, stage 3b: Secondary | ICD-10-CM | POA: Diagnosis not present

## 2024-01-05 LAB — POCT GLYCOSYLATED HEMOGLOBIN (HGB A1C): Hemoglobin A1C: 6.2 % — AB (ref 4.0–5.6)

## 2024-01-05 MED ORDER — LIRAGLUTIDE 18 MG/3ML ~~LOC~~ SOPN: 1.8000 mg | PEN_INJECTOR | Freq: Every day | SUBCUTANEOUS | 4 refills | Status: AC

## 2024-01-05 NOTE — Progress Notes (Unsigned)
 [facility] [address] [place], [place] [address]  Internal MEDICINE  Office Visit Note  Patient Name: Frances Greene  [id number]  [id number]  Date of Service: 01/05/2024  Chief Complaint  Patient presents with   Diabetes   Hypertension   Follow-up     Frances Greene presents for a follow-up visit for Diabetes -- issues with victoza  not being in stock at pharmacy. Had prior adverse reaction to ozempic . Tried mounjaro  Diabetic shoes -- paperwork is done   The 10-year ASCVD risk score (Arnett DK, et al., 2019) is: 31.6%   Values used to calculate the score:     Age: 78 years     Clinically relevant sex: Female     Is Non-Hispanic African American: Yes     Diabetic: Yes     Tobacco smoker: No     Systolic Blood Pressure: 110 mmHg     Is BP treated: Yes     HDL Cholesterol: 71 mg/dL     Total Cholesterol: 157 mg/dL  Title   Diabetic Foot Exam - detailed Is there a history of foot ulcer?: No Is there a foot ulcer now?: No Is there swelling?: No Is there elevated skin temperature?: No Is there abnormal foot shape?: No Is there a claw toe deformity?: No Are the toenails long?: Yes Are the toenails thick?: Yes Are the toenails ingrown?: No Is the skin thin, fragile, shiny and hairless?: No Normal Range of Motion?: No Is there foot or ankle muscle weakness?: Yes Do you have pain in calf while walking?: No Are the shoes appropriate in style and fit?: No Can the patient see the bottom of their feet?: Yes Pulse Foot Exam completed.: Yes   Right Posterior Tibialis: Present Left posterior Tibialis: Present   Right Dorsalis Pedis: Present Left Dorsalis Pedis: Present     Semmes-Weinstein Monofilament Test + means has sensation and - means no sensation  R Foot Test Control: Pos L Foot Test Control: Pos   R Site 1-Great Toe: Pos L Site 1-Great Toe: Pos   R Site 4: Pos L Site 4: Pos   R site 5: Pos L Site 5: Pos  R Site 6: Pos L Site 6: Pos      Image components are not supported.   Image components are not supported. Image components are not supported.  [place] Comments      Current Medication: Outpatient Encounter Medications as of 01/05/2024  Medication Sig   acetaminophen  (TYLENOL ) 325 MG tablet Take 650 mg by mouth every 6 (six) hours as needed.   atorvastatin  (LIPITOR) 10 MG tablet Take 1 tablet (10 mg total) by mouth daily.   betamethasone  dipropionate 0.05 % cream Apply topically 2 (two) times daily. (Patient taking differently: Apply 1 Application topically 2 (two) times daily as needed.)   bisoprolol  (ZEBETA ) 5 MG tablet Take 1 tablet (5 mg total) by mouth daily.   Blood Glucose Monitoring Suppl (ONETOUCH VERIO REFLECT) w/Device KIT Use as directed for once daily Dx E11.65   chlorhexidine  (PERIDEX ) 0.12 % solution Swish and spit 5 ml daily for 30 seconds   Cholecalciferol (VITAMIN D3) 1.25 MG (50000 UT) CAPS Take 1 capsule (1.25 mg total) by mouth once a week. (Patient taking differently: Take 1 capsule by mouth once a week. Monday)   cinacalcet (SENSIPAR) 30 MG tablet 30 mg daily with breakfast.   citalopram  (CELEXA ) 10 MG tablet Take 1 tablet (10 mg total) by mouth daily.   cyanocobalamin  (VITAMIN B12)  100 MCG tablet Take 100 mcg by mouth daily.   empagliflozin  (JARDIANCE ) 10 MG TABS tablet Take 1 tablet (10 mg total) by mouth daily.   febuxostat  (ULORIC ) 40 MG tablet Take 1 tablet (40 mg total) by mouth daily.   ferrous sulfate 325 (65 FE) MG tablet Take 325 mg by mouth daily with breakfast.   fluticasone -salmeterol (ADVAIR) 100-50 MCG/ACT AEPB Inhale 1 puff into the lungs 2 (two) times daily.   folic acid  (FOLVITE ) 1 MG tablet Take 1 tablet (1 mg total) by mouth daily.   furosemide  (LASIX ) 80 MG tablet Take 1 tablet (80 mg total) by mouth every morning.   Garlic 1000 MG CAPS Take 1 capsule by mouth daily.   glucose blood (ONETOUCH VERIO) test strip Use as directed once a daily   hydrALAZINE  (APRESOLINE ) 25 MG  tablet TAKE ONE TABLET BY MOUTH THREE TIMES DAILY   Insulin Pen Needle (COMFORT EZ PEN NEEDLES) 32G X 4 MM MISC Use with victoza  injections daily   ketoconazole  (NIZORAL ) 2 % shampoo SHAMPOO AS DIRECTED   latanoprost (XALATAN) 0.005 % ophthalmic solution latanoprost 0.005 % eye drops  INSTILL 1 DROP INTO EACH EYE IN THE EVENING   meloxicam  (MOBIC ) 7.5 MG tablet TAKE 1 TABLET BY MOUTH ONCE DAILY FOR  GOUT  AND  ARTHRITIS   mirabegron  ER (MYRBETRIQ ) 50 MG TB24 tablet Take 1 tablet (50 mg total) by mouth daily.   montelukast  (SINGULAIR ) 10 MG tablet Take 1 tablet (10 mg total) by mouth every evening.   NON FORMULARY CPAP pt uses [facility] for CPAP   Omega-3 Fatty Acids (FISH OIL PO) Take 1,000 mg by mouth daily.   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   tirzepatide  (MOUNJARO ) 2.5 MG/0.5ML Pen Inject 2.5 mg into the skin once a week.   tizanidine  (ZANAFLEX ) 2 MG capsule Take 1 capsule (2 mg total) by mouth at bedtime as needed for muscle spasms.   valACYclovir  (VALTREX ) 500 MG tablet Take 1 tablet (500 mg total) by mouth daily.   No facility-administered encounter medications on file as of 01/05/2024.    Surgical History: Past Surgical History:  Procedure Laterality Date   ABDOMINAL HYSTERECTOMY     BREAST BIOPSY Left 1989   neg   CARPAL TUNNEL RELEASE Right 02/17/2023   Procedure: RIGHT CARPAL TUNNEL RELEASE WITH ULTRASOUND GUIDANCE;  Surgeon: Claudene Penne ORN, MD;  Location: [facility] ORS;  Service: Neurosurgery;  Laterality: Right;   CATARACT EXTRACTION W/PHACO Right 05/21/2015   Procedure: CATARACT EXTRACTION PHACO AND INTRAOCULAR LENS PLACEMENT (IOC);  Surgeon: Harlene Scarce, MD;  Location: [facility] ORS;  Service: Ophthalmology;  Laterality: Right;  US : AP%: 10.0 CDE: 9.53   CATARACT EXTRACTION W/PHACO Left 12/31/2020   Procedure: CATARACT EXTRACTION PHACO AND INTRAOCULAR LENS  PLACEMENT (IOC) LEFT DIABETIC 4.13 01:00.9;  Surgeon: Mittie Gaskin, MD;  Location: [facility];  Service: Ophthalmology;  Laterality: Left;   COLONOSCOPY     MEDIAL PARTIAL KNEE REPLACEMENT Bilateral 08/13/2013   MAKOplasty   PARS PLANA VITRECTOMY Right 05/21/2015   Procedure: PARS PLANA VITRECTOMY WITH 25 GAUGE;  Surgeon: Harlene Scarce, MD;  Location: [facility] ORS;  Service: Ophthalmology;  Laterality: Right;  Lot # [id number] H Endo laser: Watts: 300 Pulse Duration: 200 Total Pulses:143    SHOULDER ARTHROSCOPY Right    bone spur    Medical History: Past Medical History:  Diagnosis Date  Anemia in stage 3b chronic kidney disease (HCC)    Arthritis    Asthma    Benign essential hypertension    Bronchitis    CHF (congestive heart failure) (HCC)    Chronic kidney disease, stage 4 (severe) (HCC)    decreased kidney function   Degenerative joint disease    Gout    Heart murmur    Iron deficiency anemia    Multiple allergies    Obstructive sleep apnea on CPAP    Primary hyperparathyroidism    Shortness of breath dyspnea    w/ exertion   Sickle cell trait    Spinal stenosis of cervical region    Swelling of both lower extremities    Type 2 diabetes mellitus with chronic kidney disease (HCC)    Wheezing     Family History: Family History  Problem Relation Age of Onset   Breast cancer Cousin    Breast cancer Other    Hypertension Mother    Diabetes Mother    Hypertension Father    Diabetes Father    Alcohol  abuse Father    Aneurysm Sister    Alcohol  abuse Sister    Stroke Sister        died from stroke   Hepatitis Brother    Hypertension Sister        dialysis   Diabetes Sister    Alcoholism Brother    Alcoholism Brother        dies from head injury from a fall.  not an alcoholic,   Cancer Brother    Diabetes Brother    Hypertension Brother    Diabetes Brother    Cancer Brother    Hypertension Brother    Stroke Brother    Alcoholism Brother      Social History   Socioeconomic History   Marital status: Divorced    Spouse name: Not on file   Number of children: 1   Years of education: Not on file   Highest education level: Not on file  Occupational History   Not on file  Tobacco Use   Smoking status: Former    Types: Cigarettes   Smokeless tobacco: Never   Tobacco comments:    4 cigarettes quit 20 years ago  Vaping Use   Vaping status: Never Used  Substance and Sexual Activity   Alcohol  use: Not Currently   Drug use: Never   Sexual activity: Not on file  Other Topics Concern   Not on file  Social History Narrative   Lives alone   Social Drivers of Health   Tobacco Use: Medium Risk (01/05/2024)   Patient History    Smoking Tobacco Use: Former    Smokeless Tobacco Use: Never    Passive Exposure: Not on file  Financial Resource Strain: Medium Risk (06/01/2021)   Received from [facility]   Overall Financial Resource Strain (CARDIA)    Difficulty of Paying Living Expenses: Somewhat hard  Food Insecurity: No Food Insecurity (03/11/2022)   Received from [facility]   Epic    Within the past 12 months, you worried that your food would run out before you got the money to buy more.: Never true    Within the past 12 months, the food you bought just didn't last and you didn't have money to get more.: Never true  Transportation Needs: No Transportation Needs (03/11/2022)   Received from [facility]   PRAPARE - Transportation    Lack of Transportation (Medical): No  Lack of Transportation (Non-Medical): No  Physical Activity: Not on file  Stress: Not on file  Social Connections: Not on file  Intimate Partner Violence: Not on file  Depression (PHQ2-9): Low Risk (09/14/2023)   Depression (PHQ2-9)    PHQ-2 Score: 0  Alcohol  Screen: Low Risk (10/06/2021)   Alcohol  Screen    Last Alcohol  Screening Score (AUDIT): 0  Housing: Not on file  Utilities: Not on file  Health Literacy: Not on file       Review of Systems  Vital Signs: BP 110/60   Pulse 71   Temp (!) 96.4 F (35.8 C)   Resp 16   Ht 5' 3 (1.6 m)   Wt 221 lb 9.6 oz (100.5 kg)   SpO2 93%   BMI 39.25 kg/m    Physical Exam Vitals reviewed.  Constitutional:      General: She is not in acute distress.    Appearance: Normal appearance. She is obese. She is not ill-appearing.  HENT:     Head: Normocephalic and atraumatic.  Eyes:     Pupils: Pupils are equal, round, and reactive to light.  Cardiovascular:     Rate and Rhythm: Normal rate and regular rhythm.     Pulses:          Dorsalis pedis pulses are 2+ on the right side and 2+ on the left side.       Posterior tibial pulses are 2+ on the right side and 2+ on the left side.  Pulmonary:     Effort: Pulmonary effort is normal. No respiratory distress.  Musculoskeletal:     Right foot: Decreased range of motion. No deformity, bunion, Charcot foot, foot drop or prominent metatarsal heads.     Left foot: Decreased range of motion. No deformity, bunion, Charcot foot, foot drop or prominent metatarsal heads.  Feet:     Right foot:     Protective Sensation: 6 sites tested.  6 sites sensed.     Skin integrity: Callus and dry skin present. No ulcer, blister, skin breakdown, erythema, warmth or fissure.     Toenail Condition: Right toenails are abnormally thick. Fungal disease present.    Left foot:     Protective Sensation: 6 sites tested.  6 sites sensed.     Skin integrity: Callus and dry skin present. No ulcer, blister, skin breakdown, erythema, warmth or fissure.     Toenail Condition: Left toenails are abnormally thick. Fungal disease present. Neurological:     Mental Status: She is alert and oriented to person, place, and time.     Cranial Nerves: No cranial nerve deficit.     Coordination: Coordination normal.     Gait: Gait normal.  Psychiatric:        Mood and Affect: Mood normal.        Behavior: Behavior normal.         Assessment/Plan:   General Counseling: Frances Greene verbalizes understanding of the findings of todays visit and agrees with plan of treatment. I have discussed any further diagnostic evaluation that may be needed or ordered today. We also reviewed her medications today. she has been encouraged to call the office with any questions or concerns that should arise related to todays visit.    Orders Placed This Encounter  Procedures   POCT glycosylated hemoglobin (Hb A1C)    No orders of the defined types were placed in this encounter.   No follow-ups on file.   Total time spent:*** Minutes Time spent  includes review of chart, medications, test results, and follow up plan with the patient.   [place] Controlled Substance Database was reviewed by me.  This patient was seen by Mardy Maxin, FNP-C in collaboration with Dr. Sigrid Bathe as a part of collaborative care agreement.   Yusuke Beza R. Maxin, MSN, FNP-C Internal medicine

## 2024-01-10 ENCOUNTER — Ambulatory Visit: Admitting: Podiatry

## 2024-01-11 ENCOUNTER — Other Ambulatory Visit: Payer: Self-pay | Admitting: Nurse Practitioner

## 2024-01-11 DIAGNOSIS — N3281 Overactive bladder: Secondary | ICD-10-CM

## 2024-01-17 ENCOUNTER — Encounter: Payer: Self-pay | Admitting: Oncology

## 2024-01-23 ENCOUNTER — Other Ambulatory Visit: Payer: Self-pay | Admitting: Nurse Practitioner

## 2024-01-23 DIAGNOSIS — E1165 Type 2 diabetes mellitus with hyperglycemia: Secondary | ICD-10-CM

## 2024-01-23 DIAGNOSIS — L282 Other prurigo: Secondary | ICD-10-CM

## 2024-01-24 LAB — HM DIABETES EYE EXAM

## 2024-01-25 ENCOUNTER — Ambulatory Visit: Admitting: Podiatry

## 2024-01-25 VITALS — Ht 63.0 in | Wt 221.6 lb

## 2024-01-25 DIAGNOSIS — E119 Type 2 diabetes mellitus without complications: Secondary | ICD-10-CM | POA: Diagnosis not present

## 2024-01-25 DIAGNOSIS — B351 Tinea unguium: Secondary | ICD-10-CM | POA: Diagnosis not present

## 2024-01-25 DIAGNOSIS — M79674 Pain in right toe(s): Secondary | ICD-10-CM

## 2024-01-25 DIAGNOSIS — M79675 Pain in left toe(s): Secondary | ICD-10-CM | POA: Diagnosis not present

## 2024-01-25 NOTE — Progress Notes (Signed)
"  °  Subjective:  Patient ID: Frances Greene, female    DOB: 03-23-1945,  MRN: [id number]  Chief Complaint  Patient presents with   Diabetes    Rm1 [facility]. Patient would like right hallux evaluated for ingrown toenail (medial border).    79 y.o. female presents with the above complaint. History confirmed with patient.  A1c well-controlled 6.3%.  Had some pain on the inside of the right great toe.  No other issues.  Objective:  Physical Exam: warm, good capillary refill, no trophic changes or ulcerative lesions, normal DP and PT pulses, normal monofilament exam, and normal sensory exam. Left Foot: dystrophic yellowed discolored nail plates with subungual debris Right Foot: dystrophic yellowed discolored nail plates with subungual debris  Assessment:   1. Pain due to onychomycosis of toenails of both feet   2. Controlled type 2 diabetes mellitus (HCC)      Plan:  Patient was evaluated and treated and all questions answered.  Patient educated on diabetes. Discussed proper diabetic foot care and discussed risks and complications of disease. Educated patient in depth on reasons to return to the office immediately should he/she discover anything concerning or new on the feet. All questions answered. Discussed proper shoes as well.  She has already discussed getting diabetic shoes through [employer] [facility].  Prescription written and faxed to them  Discussed the etiology and treatment options for the condition in detail with the patient. Recommended debridement of the nails today. Sharp and mechanical debridement performed of all painful and mycotic nails today. Nails debrided in length and thickness using a nail nipper to level of comfort. Follow up as needed for painful nails.    Return in about 3 months (around 04/24/2024) for at risk diabetic foot care.   "

## 2024-02-07 ENCOUNTER — Encounter: Payer: Self-pay | Admitting: Internal Medicine

## 2024-02-07 ENCOUNTER — Encounter: Payer: Self-pay | Admitting: Oncology

## 2024-02-07 ENCOUNTER — Ambulatory Visit: Payer: HMO | Admitting: Internal Medicine

## 2024-02-07 VITALS — BP 120/75 | HR 75 | Temp 98.0°F | Resp 16 | Ht 63.0 in | Wt 216.0 lb

## 2024-02-07 DIAGNOSIS — G4733 Obstructive sleep apnea (adult) (pediatric): Secondary | ICD-10-CM

## 2024-02-07 DIAGNOSIS — J4489 Other specified chronic obstructive pulmonary disease: Secondary | ICD-10-CM

## 2024-02-07 MED ORDER — BREZTRI AEROSPHERE 160-9-4.8 MCG/ACT IN AERO: 2.0000 | INHALATION_SPRAY | Freq: Two times a day (BID) | RESPIRATORY_TRACT | 11 refills | Status: AC

## 2024-02-07 NOTE — Progress Notes (Unsigned)
 [facility] [address] [place], [place] [address]  Pulmonary Sleep Medicine   Office Visit Note  Patient Name: Frances Greene DOB: [birth year] MRN [id number]  Date of Service: 02/07/2024  Complaints/HPI: She states she has been having more shortness of breath than normal. She states she is taking advair does not feel she is getting benfit. She has not had much cough. She does have sputum. She feels wheezy at times. As far as OSA she is using her CPAP machine at night. She feels like she breathes best at night on the machine  Office Spirometry Results:     ROS  General: (-) fever, (-) chills, (-) night sweats, (-) weakness Skin: (-) rashes, (-) itching,. Eyes: (-) visual changes, (-) redness, (-) itching. Nose and Sinuses: (-) nasal stuffiness or itchiness, (-) postnasal drip, (-) nosebleeds, (-) sinus trouble. Mouth and Throat: (-) sore throat, (-) hoarseness. Neck: (-) swollen glands, (-) enlarged thyroid , (-) neck pain. Respiratory: - cough, (-) bloody sputum, + shortness of breath, - wheezing. Cardiovascular: - ankle swelling, (-) chest pain. Lymphatic: (-) lymph node enlargement. Neurologic: (-) numbness, (-) tingling. Psychiatric: (-) anxiety, (-) depression   Current Medication: Outpatient Encounter Medications as of 02/07/2024  Medication Sig   acetaminophen  (TYLENOL ) 325 MG tablet Take 650 mg by mouth every 6 (six) hours as needed.   atorvastatin  (LIPITOR) 10 MG tablet Take 1 tablet (10 mg total) by mouth daily.   betamethasone  dipropionate 0.05 % cream APPLY  CREAM TOPICALLY TWICE DAILY   bisoprolol  (ZEBETA ) 5 MG tablet Take 1 tablet (5 mg total) by mouth daily.   Blood Glucose Monitoring Suppl (ONETOUCH VERIO REFLECT) w/Device KIT Use as directed for once daily Dx E11.65   chlorhexidine  (PERIDEX ) 0.12 % solution Swish and spit 5 ml daily for 30 seconds   Cholecalciferol (VITAMIN D3) 1.25 MG (50000 UT) CAPS Take 1 capsule (1.25 mg total) by mouth  once a week. (Patient taking differently: Take 1 capsule by mouth once a week. Monday)   cinacalcet (SENSIPAR) 30 MG tablet 30 mg daily with breakfast.   citalopram  (CELEXA ) 10 MG tablet Take 1 tablet (10 mg total) by mouth daily.   cyanocobalamin  (VITAMIN B12) 100 MCG tablet Take 100 mcg by mouth daily.   EMBECTA PEN NEEDLE NANO 2 GEN 32G X 4 MM MISC USE WITH VICTOZA  INJECTIONS DAILY   empagliflozin  (JARDIANCE ) 10 MG TABS tablet Take 1 tablet (10 mg total) by mouth daily.   febuxostat  (ULORIC ) 40 MG tablet Take 1 tablet (40 mg total) by mouth daily.   ferrous sulfate 325 (65 FE) MG tablet Take 325 mg by mouth daily with breakfast.   fluticasone -salmeterol (ADVAIR) 100-50 MCG/ACT AEPB Inhale 1 puff into the lungs 2 (two) times daily.   folic acid  (FOLVITE ) 1 MG tablet Take 1 tablet (1 mg total) by mouth daily.   furosemide  (LASIX ) 80 MG tablet Take 1 tablet (80 mg total) by mouth every morning.   Garlic 1000 MG CAPS Take 1 capsule by mouth daily.   glucose blood (ONETOUCH VERIO) test strip Use as directed once a daily   hydrALAZINE  (APRESOLINE ) 25 MG tablet TAKE ONE TABLET BY MOUTH THREE TIMES DAILY   ketoconazole  (NIZORAL ) 2 % shampoo SHAMPOO AS DIRECTED   latanoprost (XALATAN) 0.005 % ophthalmic solution latanoprost 0.005 % eye drops  INSTILL 1 DROP INTO EACH EYE IN THE EVENING   liraglutide  (VICTOZA ) 18 MG/3ML SOPN Inject 1.8 mg into the skin daily.   meloxicam  (MOBIC ) 7.5 MG  tablet TAKE 1 TABLET BY MOUTH ONCE DAILY FOR  GOUT  AND  ARTHRITIS   montelukast  (SINGULAIR ) 10 MG tablet Take 1 tablet (10 mg total) by mouth every evening.   MYRBETRIQ  50 MG TB24 tablet Take 1 tablet by mouth once daily   NON FORMULARY CPAP pt uses [facility] for CPAP   Omega-3 Fatty Acids (FISH OIL PO) Take 1,000 mg by mouth daily.   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   tirzepatide  (MOUNJARO ) 2.5 MG/0.5ML  Pen Inject 2.5 mg into the skin once a week.   tizanidine  (ZANAFLEX ) 2 MG capsule Take 1 capsule (2 mg total) by mouth at bedtime as needed for muscle spasms.   valACYclovir  (VALTREX ) 500 MG tablet Take 1 tablet (500 mg total) by mouth daily.   No facility-administered encounter medications on file as of 02/07/2024.    Surgical History: Past Surgical History:  Procedure Laterality Date   ABDOMINAL HYSTERECTOMY     BREAST BIOPSY Left 1989   neg   CARPAL TUNNEL RELEASE Right 02/17/2023   Procedure: RIGHT CARPAL TUNNEL RELEASE WITH ULTRASOUND GUIDANCE;  Surgeon: Claudene Penne ORN, MD;  Location: [facility] ORS;  Service: Neurosurgery;  Laterality: Right;   CATARACT EXTRACTION W/PHACO Right 05/21/2015   Procedure: CATARACT EXTRACTION PHACO AND INTRAOCULAR LENS PLACEMENT (IOC);  Surgeon: Harlene Scarce, MD;  Location: [facility] ORS;  Service: Ophthalmology;  Laterality: Right;  US : AP%: 10.0 CDE: 9.53   CATARACT EXTRACTION W/PHACO Left 12/31/2020   Procedure: CATARACT EXTRACTION PHACO AND INTRAOCULAR LENS PLACEMENT (IOC) LEFT DIABETIC 4.13 01:00.9;  Surgeon: Mittie Gaskin, MD;  Location[facility];  Service: Ophthalmology;  Laterality: Left;   COLONOSCOPY     MEDIAL PARTIAL KNEE REPLACEMENT Bilateral 08/13/2013   MAKOplasty   PARS PLANA VITRECTOMY Right 05/21/2015   Procedure: PARS PLANA VITRECTOMY WITH 25 GAUGE;  Surgeon: Harlene Scarce, MD;  Location: [facility] ORS;  Service: Ophthalmology;  Laterality: Right;  Lot # [id number] H Endo laser: Watts: 300 Pulse Duration: 200 Total Pulses:143    SHOULDER ARTHROSCOPY Right    bone spur    Medical History: Past Medical History:  Diagnosis Date   Anemia in stage 3b chronic kidney disease (HCC)    Arthritis    Asthma    Benign essential hypertension    Bronchitis    CHF (congestive heart failure) (HCC)    Chronic kidney disease, stage 4 (severe) (HCC)    decreased kidney function   Degenerative joint disease    Gout    Heart murmur     Iron deficiency anemia    Multiple allergies    Obstructive sleep apnea on CPAP    Primary hyperparathyroidism    Shortness of breath dyspnea    w/ exertion   Sickle cell trait    Spinal stenosis of cervical region    Swelling of both lower extremities    Type 2 diabetes mellitus with chronic kidney disease (HCC)    Wheezing     Family History: Family History  Problem Relation Age of Onset   Breast cancer Cousin    Breast cancer Other    Hypertension Mother    Diabetes Mother    Hypertension Father    Diabetes Father    Alcohol  abuse Father    Aneurysm Sister    Alcohol  abuse Sister    Stroke Sister        died from  stroke   Hepatitis Brother    Hypertension Sister        dialysis   Diabetes Sister    Alcoholism Brother    Alcoholism Brother        dies from head injury from a fall.  not an alcoholic,   Cancer Brother    Diabetes Brother    Hypertension Brother    Diabetes Brother    Cancer Brother    Hypertension Brother    Stroke Brother    Alcoholism Brother     Social History: Social History   Socioeconomic History   Marital status: Divorced    Spouse name: Not on file   Number of children: 1   Years of education: Not on file   Highest education level: Not on file  Occupational History   Not on file  Tobacco Use   Smoking status: Former    Types: Cigarettes   Smokeless tobacco: Never   Tobacco comments:    4 cigarettes quit 20 years ago  Vaping Use   Vaping status: Never Used  Substance and Sexual Activity   Alcohol  use: Not Currently   Drug use: Never   Sexual activity: Not on file  Other Topics Concern   Not on file  Social History Narrative   Lives alone   Social Drivers of Health   Tobacco Use: Medium Risk (02/07/2024)   Patient History    Smoking Tobacco Use: Former    Smokeless Tobacco Use: Never    Passive Exposure: Not on file  Financial Resource Strain: Medium Risk (06/01/2021)   Received from [facility]   Overall  Financial Resource Strain (CARDIA)    Difficulty of Paying Living Expenses: Somewhat hard  Food Insecurity: No Food Insecurity (03/11/2022)   Received from [facility]   Epic    Within the past 12 months, you worried that your food would run out before you got the money to buy more.: Never true    Within the past 12 months, the food you bought just didn't last and you didn't have money to get more.: Never true  Transportation Needs: No Transportation Needs (03/11/2022)   Received from [facility]   PRAPARE - Transportation    Lack of Transportation (Medical): No    Lack of Transportation (Non-Medical): No  Physical Activity: Not on file  Stress: Not on file  Social Connections: Not on file  Intimate Partner Violence: Not on file  Depression [phone]): Low Risk (09/14/2023)   Depression (PHQ2-9)    PHQ-2 Score: 0  Alcohol  Screen: Low Risk (10/06/2021)   Alcohol  Screen    Last Alcohol  Screening Score (AUDIT): 0  Housing: Not on file  Utilities: Not on file  Health Literacy: Not on file    Vital Signs: Blood pressure (!) 165/75, pulse 75, temperature 98 F (36.7 C), resp. rate 16, height 5' 3 (1.6 m), weight 216 lb (98 kg), SpO2 92%.  Examination: General Appearance: The patient is well-developed, well-nourished, and in no distress. Skin: Gross inspection of skin unremarkable. Head: normocephalic, no gross deformities. Eyes: no gross deformities noted. ENT: ears appear grossly normal no exudates. Neck: Supple. No thyromegaly. No LAD. Respiratory: no rhonchi noted. Cardiovascular: Normal S1 and S2 without murmur or rub. Extremities: No cyanosis. pulses are equal. Neurologic: Alert and oriented. No involuntary movements.  LABS: Recent Results (from the past 2160 hours)  POCT glycosylated hemoglobin (Hb A1C)     Status: Abnormal   Collection Time: 01/05/24 11:27 AM  Result  Value Ref Range   Hemoglobin A1C 6.2 (A) 4.0 - 5.6 %   HbA1c POC (<> result, manual entry)      HbA1c, POC (prediabetic range)     HbA1c, POC (controlled diabetic range)    HM DIABETES EYE EXAM     Status: None   Collection Time: 01/24/24 12:00 AM  Result Value Ref Range   HM Diabetic Eye Exam No Retinopathy No Retinopathy    Radiology: MM 3D DIAGNOSTIC MAMMOGRAM BILATERAL BREAST Result Date: 10/26/2023 CLINICAL DATA:  BI-RADS 3 follow-up of LEFT breast edema. History of hypertension, diabetes, CHF and CKD. Predominately lies on her LEFT side. EXAM: DIGITAL DIAGNOSTIC BILATERAL MAMMOGRAM WITH TOMOSYNTHESIS AND CAD; ULTRASOUND LEFT BREAST LIMITED TECHNIQUE: Bilateral digital diagnostic mammography and breast tomosynthesis was performed. The images were evaluated with computer-aided detection. ; Targeted ultrasound examination of the left breast was performed. COMPARISON:  Previous exam(s). ACR Breast Density Category c: The breasts are heterogeneously dense, which may obscure small masses. FINDINGS: Diagnostic images of the LEFT breast demonstrate a similar mammographic appearance of diffuse trabecular thickening of the LEFT breast with mild asymmetric skin thickening. This is similar compared to most recent prior and overall improved since 2023. No suspicious mass, distortion, or microcalcifications are identified to suggest presence of malignancy. Severe bilateral vascular calcifications. On physical exam, there is mild edema of the LEFT breast without suspicious skin changes or mass appreciated. Targeted ultrasound was performed of the LEFT retroareolar breast. Skin thickening and subcutaneous edema are noted. Targeted ultrasound is performed of the LEFT axilla. No suspicious axillary lymph nodes are visualized. IMPRESSION: 1. Similar appearance of asymmetric LEFT breast edema and skin thickening since most recent prior but overall improved since 2023. No suspicious LEFT axillary adenopathy. This is likely physiologic and due to a combination of fluid overload and positioning. Inflammatory  breast cancer is highly unlikely given longstanding stability/improvement. 2. No mammographic evidence of malignancy bilaterally. RECOMMENDATION: 1. Recommend conservative clinical management of LEFT-sided edema with continued medical management of underlying comorbidities. Central venous stenosis or other etiology of lymphedema could be an additional diagnostic consideration in the appropriate clinical setting. Should there be persistent clinical concern for inflammatory breast cancer, a skin punch biopsy or breast MRI could be considered. 2.  Screening mammogram in one year.(Code:SM-B-01Y) I have discussed the findings and recommendations with the patient. If applicable, a reminder letter will be sent to the patient regarding the next appointment. BI-RADS CATEGORY  2: Benign. Electronically Signed   By: Corean Salter M.D.   On: 10/26/2023 14:54   US  LIMITED ULTRASOUND INCLUDING AXILLA LEFT BREAST  Result Date: 10/26/2023 CLINICAL DATA:  BI-RADS 3 follow-up of LEFT breast edema. History of hypertension, diabetes, CHF and CKD. Predominately lies on her LEFT side. EXAM: DIGITAL DIAGNOSTIC BILATERAL MAMMOGRAM WITH TOMOSYNTHESIS AND CAD; ULTRASOUND LEFT BREAST LIMITED TECHNIQUE: Bilateral digital diagnostic mammography and breast tomosynthesis was performed. The images were evaluated with computer-aided detection. ; Targeted ultrasound examination of the left breast was performed. COMPARISON:  Previous exam(s). ACR Breast Density Category c: The breasts are heterogeneously dense, which may obscure small masses. FINDINGS: Diagnostic images of the LEFT breast demonstrate a similar mammographic appearance of diffuse trabecular thickening of the LEFT breast with mild asymmetric skin thickening. This is similar compared to most recent prior and overall improved since 2023. No suspicious mass, distortion, or microcalcifications are identified to suggest presence of malignancy. Severe bilateral vascular  calcifications. On physical exam, there is mild edema of the LEFT breast without suspicious  skin changes or mass appreciated. Targeted ultrasound was performed of the LEFT retroareolar breast. Skin thickening and subcutaneous edema are noted. Targeted ultrasound is performed of the LEFT axilla. No suspicious axillary lymph nodes are visualized. IMPRESSION: 1. Similar appearance of asymmetric LEFT breast edema and skin thickening since most recent prior but overall improved since 2023. No suspicious LEFT axillary adenopathy. This is likely physiologic and due to a combination of fluid overload and positioning. Inflammatory breast cancer is highly unlikely given longstanding stability/improvement. 2. No mammographic evidence of malignancy bilaterally. RECOMMENDATION: 1. Recommend conservative clinical management of LEFT-sided edema with continued medical management of underlying comorbidities. Central venous stenosis or other etiology of lymphedema could be an additional diagnostic consideration in the appropriate clinical setting. Should there be persistent clinical concern for inflammatory breast cancer, a skin punch biopsy or breast MRI could be considered. 2.  Screening mammogram in one year.(Code:SM-B-01Y) I have discussed the findings and recommendations with the patient. If applicable, a reminder letter will be sent to the patient regarding the next appointment. BI-RADS CATEGORY  2: Benign. Electronically Signed   By: Corean Salter M.D.   On: 10/26/2023 14:54    No results found.  No results found.  Assessment and Plan: Patient Active Problem List   Diagnosis Date Noted   Carpal tunnel syndrome of right wrist 01/05/2023   Weakness of right hand 01/05/2023   Sleep related leg cramps 10/26/2022   Type 2 diabetes mellitus with vascular disease (HCC) 10/26/2022   Allergic contact dermatitis due to drugs in contact with skin 10/26/2022   Hypertension associated with stage 3b chronic kidney  disease due to type 2 diabetes mellitus (HCC) 10/10/2022   Chronic heart failure with preserved ejection fraction (HCC) 06/11/2021   Thrombocytopenia 06/11/2021   B12 deficiency 03/19/2020   Anemia in stage 3b chronic kidney disease (HCC) 03/19/2020   Acute bilateral low back pain with right-sided sciatica 11/12/2018   Pain due to onychomycosis of toenails of both feet 07/17/2018   Chronic allergic rhinitis 11/14/2017   Primary insomnia 11/14/2017   Type 2 diabetes mellitus with chronic kidney disease (HCC) 01/19/2017   Cervicalgia 01/19/2017   Vitamin D  deficiency 01/19/2017   OSA (obstructive sleep apnea) 01/19/2017   Hyperlipidemia associated with type 2 diabetes mellitus (HCC) 01/19/2017   Edema 01/19/2017   Gout 01/19/2017   Morbid obesity (HCC) 01/19/2017   CKD stage 3 due to type 2 diabetes mellitus (HCC) 01/19/2017   Hypercalcemia 01/19/2017   Iron deficiency anemia 01/14/2016   Status post bilateral unicompartmental knee replacement 11/13/2013   Arthritis, senescent 08/02/2013    1. OSA (obstructive sleep apnea) (Primary) ***  2. Obstructive chronic bronchitis without exacerbation (HCC) *** - budesonide-glycopyrrolate-formoterol (BREZTRI  AEROSPHERE) 160-9-4.8 MCG/ACT AERO inhaler; Inhale 2 puffs into the lungs 2 (two) times daily.  Dispense: 10.7 g; Refill: 11  3. Obesity, morbid (HCC) ***   General Counseling: I have discussed the findings of the evaluation and examination with Heron.  I have also discussed any further diagnostic evaluation thatmay be needed or ordered today. Olayinka verbalizes understanding of the findings of todays visit. We also reviewed her medications today and discussed drug interactions and side effects including but not limited excessive drowsiness and altered mental states. We also discussed that there is always a risk not just to her but also people around her. she has been encouraged to call the office with any questions or concerns that  should arise related to todays visit.  No orders of the defined  types were placed in this encounter.    Time spent: ***  I have personally obtained a history, examined the patient, evaluated laboratory and imaging results, formulated the assessment and plan and placed orders.    Elfreda DELENA Bathe, MD [facility] Sleep medicine

## 2024-02-07 NOTE — Patient Instructions (Signed)

## 2024-02-16 NOTE — ED Triage Notes (Signed)
 Patient has been having increased shortness breath for a week, getting worse last night. Denied sick contact. Denied chest pain, but endorsing chronic R shoulder pain. SpO2 97% on room air.

## 2024-02-16 NOTE — ED Provider Notes (Signed)
 Received sign out from previous provider.  Patient Summary: Frances Greene is a 79 y.o. female with a PMH of HTN, CHF (LVEF 55-60% on 06/01/21), HLD, T2DM, asthma, OSA on CPAP, gout, and CKD3b presenting with 6 days of progressively worsening shortness of breath and increasing BLE edema. Prior to sign-out, CBC was unremarkable for anemia or leukocytosis. CMP with Sodium 146 and Creatinine 1.52. hsTrop of 156 and Pro-BNP 6,429. Negative 4plex. XR Chest shows interstitial pulmonary edema. She was treated with Tylenol  and Lasix , and was admitted for CHF exacerbation.  Action List:  Pending ED POCUS and POCT Glucose  Updates ED Course as of 02/16/24 1536  Thu Feb 16, 2024  1510 78/F with hx of CHF paged for admission for CHF exacerbation. Trops flat, pulmonary edema. On 2L [place] for comfort  1520 Dr. Lelon spoke with admitting team.      Documentation assistance was provided by Reena Flatten, Scribe, on February 09, 2024 at 4:05 PM for [name], MD.  Documentation assistance was provided by the scribe in my presence.  The documentation recorded by the scribe has been reviewed by me and accurately reflects the services I personally performed.  Note has been documented by Reena Flatten on 02/16/2024

## 2024-02-16 NOTE — H&P (Signed)
 ------------------------------------------------------------------------------- Attestation signed by Akolbire, Dunstan Atiayarne, MD at 02/16/24 1857 I saw and evaluated the patient, participating in the key portions of the service.  I reviewed the residents note.  I agree with the residents findings and plan. Jacklyn DELENA Gentle, MD  -------------------------------------------------------------------------------  Internal Medicine (MEDM) History & Physical  Assessment & Plan:  Frances Greene is a 79 y.o. female who is presenting to [facility] with CHF exacerbation (CMS-HCC), in the setting of the following pertinent/contributing co-morbidities:  HFpEF (last Echo 05/2021), CKD3B, T2DM, HTN, COPD, OSA  Principal Problem:   CHF exacerbation (CMS-HCC) Active Problems:   HTN (hypertension)   CKD (chronic kidney disease)   Heart failure (CMS-HCC)   Type 2 diabetes mellitus (CMS-HCC)   Gout   Depression    Active Problems  Dyspnea on exertion  lower extremity edema acute hypoxic respiratory failure concern for acute on chronic HFpEF Patient presented with 5 days of increased dyspnea on exertion, orthopnea, and lower extremity edema. No chest pain, palpitations, or sick symptoms other than chronic baseline productive cough, and no changes in diet. Has missed about 3 home lasix  doses in the past 2 weeks, otherwise compliant with home meds (bisoprolol  and jardiance ). Last echocardiogram May 2023 with grade 2 diastolic dysfunction, normal EF, LA and RA moderate dilation, mild PH. On admission patient hypertensive to 170/72 otherwise normal HR afebrile on 2L O2 (no baseline O2 need). HsTrops elevated to 153 but flat. EKG with NSR. possible LAD with LVH. CXR on admission with reticulonodular opacity right lung base with patchy airspace opacity, may represent infiltrate and/or atelectasis and interstitial pulmonary edema. ProBNP elevated to 6,429 with unknown baseline. Ddx includes HFpEF  exacerbation, PE, ACS, COPD exacerbation less likely. ED provided 80 mg IV lasix  x1 with patient reporting robust urine output. Will observe UOP overnight and plan to re-dose with 80 mg IV tomorrow morning - S/p Lasix  IV 80 mg 1/28 AM  - Redose Lasix  80 mg IV - Hold home lasix  80 mg daily - Continue formulary equivalent of home bisoprolol  5 mg (metoprolol succinate 100 mg daily) - Continue jardiance  10 mg - Repeat echocardiogram ordered -Tele -Strict I&Os, daily weights -EKG -Lipid panel, A1C, TSH, LFTs -Daily CBC, BMP, Mg  -Avoid NSAIDS -Consider addition of spironolactone prior to discharge  Asymmetric lower extremity edema Per patient no chronic edema at baseline, R leg 2+ pitting edema to knee, L leg trace edema. No history of PE, no tachycardia, normal EKG, less concerning for DVT but will rule out with imaging - RLE PVL   Hypertension Hypertensive on admission above home baseline (well controlled) likely secondary to volume overload - Continue home hydralazine  25 TID  Chronic problems:  COPD Not concerned for acute exacerbation - Continue formula equivalent of home Breztri  - Albuterol PRN - Continue home Montax  OSA - Hold nightly CPAP  CKD 3B Hyperparathyroidism On chart review baseline Cr: 1.5-1.8. Creatinine within baseline. - Daily BMP - Continue home vitamin D  - Continue home Sensipar - Continue home vitamin B12 - Continue home folic acid   T2DM A1C 6.1%. 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 PPX  The patient's presentation is complicated by the following clinically significant conditions requiring additional evaluation and treatment: - Chronic kidney disease POA requiring further investigation, treatment, or monitoring  - Hypoxia requiring further investigation, treatment,  or  monitoring               Issues Impacting Complexity of Management: <redacted file path> -The patient is at high risk for the development of complications of volume overload due to the need to provide IV hydration for suspected hypovolemia in the setting of: heart failure   Checklist: Diet: Regular Diet DVT PPx: Heparin 5000units q8h Code Status: DNR and DNI Dispo: Patient appropriate for   Team Contact Information:  Primary Team: Internal Medicine (MEDM) Primary Resident: Kip Brand, MD Resident's Pager: [phone] [facility] MedM Intern)  Chief Concern:  CHF exacerbation (CMS-HCC)   Subjective:  Frances Greene is a 79 y.o. female with pertinent PMHx of 31F HFpEF (last Echo 05/2021), CKD3B, T2DM, HTN, COPD, OSA, 31F HFpEF (last Echo 05/2021), CKD3B, T2DM, HTN, AOCD, OSA, hyperparathyroidism, w/worsening DOEhyperparathyroidism, presenting with shortness of breath   HPI:  History of Present Illness Frances Greene is a 79 year old female with COPD and heart failure who presents with worsening shortness of breath.  She has had progressively worsening shortness of breath since Saturday. She noticed it slowly became harder to walk without having to sit and catch her breath, now having to sit even when she walks to her kitchen. SOB is worse when laying. Yesterday woke up feeling short of breath. Has noticed her legs swelling as well. Usually her left leg swells more than her right one. She tried using more puffs of her Breztri  inhaler, which didn't help.  She takes daily Lasix  but missed about three doses over the past two weeks, often when away from home due to bathroom access issues.  She has COPD and uses CPAP at night and no oxygen  during the day. She has a chronic productive cough but does not feel this is currently more than baseline.  She has COPD and heart failure with prior fluid overload requiring hospitalization.   She denies chest pain, fever, chills, nausea, vomiting,  constipation, or diarrhea. Bowel movements are regular with a daily laxative. She has chronic shoulder pain that limits some activities.  She has chronic runny nose and increased mucus production, which she did not link to her current symptoms. She denies recent dietary or salt intake changes. She lives alone and manages her medications and daily activities, though shoulder pain causes some limitations.   Pertinent Surgical Hx Bilateral knee replacements  Pertinent Social Hx  Lives alone with family nearby Independent in all ADLs  Allergies Ramipril  I reviewed the Medication List. The current list is Accurate Prior to Admission medications  Medication Dose, Route, Frequency  atorvastatin  (LIPITOR) 10 MG tablet 10 mg, Daily (standard)  bisoprolol  (ZEBETA ) 5 MG tablet 5 mg, Oral, Daily (standard)  budesonide-glycopyrrolate-formoterol (BREZTRI ) 160-9-4.8 mcg/inh 2 puffs, 2 times daily (RT)  cholecalciferol, vitamin D3-125 mcg, 5,000 unit,, 125 mcg (5,000 unit) tablet 125 mcg, Daily (standard)  cinacalcet (SENSIPAR) 30 MG tablet 30 mg, Daily (standard)  citalopram  (CELEXA ) 10 MG tablet 10 mg, Daily (standard)  cyanocobalamin  (VITAMIN B-12) 50 mcg tablet 100 mcg, Oral, Daily (standard)  empagliflozin  (JARDIANCE ) 10 mg tablet 10 mg, Oral, Daily (standard)  febuxostat  (ULORIC ) 40 mg tablet 40 mg, Daily (standard)  ferrous sulfate 325 (65 FE) MG tablet 325 mg, 2 times a day  folic acid  (FOLVITE ) 1 MG tablet 1 mg, Daily (standard)  furosemide  (LASIX ) 80 MG tablet 80 mg, Oral, Daily (standard)  garlic 1 mg cap 1 tablet, Daily (standard)  hydrALAZINE  (APRESOLINE ) 25 MG tablet 25 mg, 3 times a  day (standard)  latanoprost (XALATAN) 0.005 % ophthalmic solution 1 drop, Nightly  liraglutide  (VICTOZA ) 0.6 mg/0.1 mL (18 mg/3 mL) injection 1.8 mg, Daily (standard)  meloxicam  (MOBIC ) 7.5 MG tablet 7.5 mg, Daily (standard)  mirabegron  (MYRBETRIQ ) 50 mg Tb24 extended-release tablets 50 mg, Daily   montelukast  (SINGULAIR ) 10 mg tablet 10 mg, Nightly  omega-3 fatty acids-fish oil 340-1,000 mg capsule 1 capsule, Daily (standard)  tizanidine  (ZANAFLEX ) 2 MG capsule 2 mg, Nightly PRN  valACYclovir  (VALTREX ) 500 MG tablet 500 mg, Oral, Daily (standard)    Designated Healthcare Decision Maker: Frances Greene currently has decisional capacity for healthcare decision-making and is able to designate a surrogate healthcare decision maker. Frances Greene designated healthcare decision maker(s) is/are Frances Greene (the patient's adult child) as denoted by stated patient preference.  Objective:  Physical Exam: Temp:  [36.4 C (97.5 F)-37.2 C (99 F)] 37.2 C (99 F) Pulse:  [70-80] 74 SpO2 Pulse:  [70-77] 71 Resp:  [21-28] 24 BP: (157-178)/(65-91) 157/68 SpO2:  [97 %-100 %] 100 %  Gen: NAD, converses  Eyes: Sclera anicteric, EOMI grossly normal  HENT: Atraumatic, normocephalic Neck: Trachea midline Heart: RRR Lungs: Mild crackles bilateral bases R>L, no wheeze Abdomen: Soft, NTND Extremities: R leg 2+ pitting edema to knee, L leg trace edema Neuro: Grossly symmetric, non-focal   Skin:  No rashes, lesions on clothed exam Psych: Alert, oriented  Kip Brand, MD PGY-1 Internal Medicine [facility]

## 2024-02-16 NOTE — ED Provider Notes (Signed)
 [facility] Emergency Department Provider Note    ED Clinical Impression     Diagnosis ICD-10-CM Associated Orders  1. SOB (shortness of breath)  R06.02           Impression, Medical Decision Making, Progress Notes and Critical Care    Impression: 79 year old female with a history of diabetes, hypertension, stage IV kidney disease, heart failure, depression who presented with shortness of breath.  Her shortness of breath started on Saturday and has been progressively worsening.  It is much worse when she is lying flat and when she is ambulating.  When she experiences the shortness of breath she has palpitations but denies any chest pain, changes in vision, fevers, nausea, vomiting, abdominal pain, or changes in bowel or bladder.  No known sick contacts.  She takes 80 mg of Lasix  daily and has not missed any doses and has been taking all of her other medications as well.  On physical exam she had increased respiratory effort.  Tachypneic at 25.  On 2 L nasal cannula (baseline room air).  Lungs coarse to auscultation bilaterally.  No wheezing or stridor.  She had 1+ edema in her bilateral lower extremities.  On point-of-care ultrasound her lungs had diffuse B-lines as well as a very small pericardial effusion.  She was mildly hypertensive at 170/72.  She was afebrile and pulse was 80.  She appeared uncomfortable but was hemodynamically stable.  Her most recent echo showed a left ventricular ejection fraction of 55 to 60% in May 2023.  Differential diagnosis considered include but are not limited to: CHF exacerbation, pneumonia, volume overload, myocardial infarction, acute kidney injury  Will Order: CBC, CMP, BNP, troponin, EKG, chest x-ray, COVID/flu/RSV, point-of-care ultrasound   ED Course as of 02/16/24 1452  Thu Feb 16, 2024  1037 CBC un actionable.  1101 hsTroponin I(!!): 153 Ordered repeat troponin.   1101 Sodium(!): 146  1102 Creatinine(!): 1.52 Improved from  priors, not elevated from baseline.   1103 PRO-BNP(!): 6,429.0  1111 SARS-CoV-2 PCR: Negative  1111 Influenza A: Negative  1111 Influenza B: Negative  1111 RSV: Negative  1312 Spoke with patient.  She is reporting improved shortness of breath after Lasix  but is still symptomatic.  Given lab findings, x-ray findings, point-of-care ultrasound as well as initial requirement of oxygen  but the patient would benefit from inpatient admission.  1401 hsTroponin I(!!): 156  1403 MAO paged for admission.   1451 Patient handed off to oncoming provider with plans for admission for further care.  MAO has been paged.  She remains hemodynamically stable.    Additional MDM Elements     Discussion with other professionals: Admitting team -   Independent interpretation: EKG(s) -   X-ray(s) -   Ultrasound(s) -   I have reviewed recent and relavant previous record, including: Inpatient notes -   Outpatient notes -    Portions of this record have been created using Scientist, clinical (histocompatibility and immunogenetics). Dictation errors have been sought, but may not have been identified and corrected.  See chart and nursing documentation for additional ED course details.  ____________________________________________      History     Reason for Visit Shortness of Breath  History of Present Illness Frances Greene is a 79 year old female who presents with worsening shortness of breath. She is accompanied by her son.  She has been experiencing worsening shortness of breath since Saturday, exacerbated by lying flat and physical activity such as walking. She experiences palpitations during episodes  of dyspnea. No chest pain, fever, nausea, vomiting, changes in urination, recent illnesses, or lightheadedness.  She has noticed increased peripheral edema. She is currently taking Lasix , a diuretic, and confirms access to all her medications despite recent weather conditions.  She uses CPAP at night but does not require supplemental  oxygen  at home.  She mentions chronic shoulder pain, which is a recurring issue. She manages this pain with Tylenol .    Outside Historian(s) (EMS, Significant Other, Family, Parent, Caregiver, Friend, Patent Examiner, etc.)    Past Medical History[1]  Past Surgical History[2]  No current facility-administered medications for this encounter.  Current Outpatient Medications:    allopurinol  (ZYLOPRIM ) 300 MG tablet, Take 1 tablet (300 mg total) by mouth daily., Disp: , Rfl:    atorvastatin  (LIPITOR) 10 MG tablet, Take 1 tablet (10 mg total) by mouth daily., Disp: , Rfl:    bisoprolol  (ZEBETA ) 5 MG tablet, Take 1 tablet (5 mg total) by mouth daily., Disp: 30 tablet, Rfl: 1   cinacalcet (SENSIPAR) 30 MG tablet, Take 1 tablet (30 mg total) by mouth daily., Disp: , Rfl:    citalopram  (CELEXA ) 10 MG tablet, Take 1 tablet (10 mg total) by mouth daily., Disp: , Rfl:    empagliflozin  (JARDIANCE ) 10 mg tablet, Take 1 tablet (10 mg total) by mouth daily., Disp: 30 tablet, Rfl: 1   ergocalciferol -1,250 mcg, 50,000 unit, (DRISDOL ) 1,250 mcg (50,000 unit) capsule, Take 1 capsule (1,250 mcg total) by mouth Every Monday., Disp: , Rfl:    ferrous sulfate 325 (65 FE) MG tablet, Take 1 tablet (325 mg total) by mouth Two (2) times a day., Disp: , Rfl:    fluticasone  propion-salmeteroL (ADVAIR, WIXELA) 100-50 mcg/dose diskus, Inhale 1 puff Two (2) times a day., Disp: , Rfl:    folic acid  (FOLVITE ) 1 MG tablet, Take 1 tablet (1 mg total) by mouth daily., Disp: , Rfl:    furosemide  (LASIX ) 80 MG tablet, Take 1 tablet (80 mg total) by mouth daily., Disp: 30 tablet, Rfl: 1   garlic 1 mg cap, Take 1 tablet by mouth daily., Disp: , Rfl:    hydrALAZINE  (APRESOLINE ) 25 MG tablet, Take 1 tablet (25 mg total) by mouth Three (3) times a day., Disp: , Rfl:    latanoprost (XALATAN) 0.005 % ophthalmic solution, Administer 1 drop to both eyes nightly., Disp: , Rfl:    liraglutide  (VICTOZA ) 0.6 mg/0.1 mL (18  mg/3 mL) injection, Inject 0.3 mL (1.8 mg total) under the skin daily., Disp: , Rfl:    montelukast  (SINGULAIR ) 10 mg tablet, Take 1 tablet (10 mg total) by mouth nightly., Disp: , Rfl:    omega-3 fatty acids-fish oil 340-1,000 mg capsule, Take 1 capsule by mouth daily., Disp: , Rfl:    pregabalin  (LYRICA ) 25 MG capsule, Take 1 capsule (25 mg total) by mouth Two (2) times a day., Disp: , Rfl:   Allergies Ramipril  Family History[3]  Short Social History[4]    Physical Exam   ED Triage Vitals [02/16/24 0942]  Enc Vitals Group     BP (!) 170/72     Pulse 80     SpO2 Pulse      Resp      Temp 36.4 C (97.5 F)     Temp src      SpO2 97 %     Weight 95.3 kg (210 lb)     Height      Head Circumference      Peak Flow  Pain Score      Pain Loc      Pain Education      Exclude from Growth Chart     Constitutional: Alert and oriented. Well appearing and in no distress. Eyes: Conjunctivae are normal. Hematological/Lymphatic/Immunilogical: No cervical lymphadenopathy. Cardiovascular: Normal rate, regular rhythm. Normal and symmetric distal pulses are present in all extremities. Respiratory: Increased respiratory effort.  Tachypneic at 25.  On 2 L nasal cannula.  Lungs coarse to auscultation bilaterally.  No wheezing or stridor. Gastrointestinal: Soft and nontender. There is no CVA tenderness. Musculoskeletal: Normal range of motion in all extremities.      Right lower leg: No tenderness or erythema.  1+ edema.      Left lower leg: No tenderness or erythema.  1+ edema. Neurologic: Normal speech and language. No gross focal neurologic deficits are appreciated. Skin: Skin is warm, dry and intact. No rash noted. Psychiatric: Mood and affect are normal. Speech and behavior are normal.    Radiology   XR Chest 2 views  Final Result    Reticulonodular opacity right lung base with patchy airspace opacity, may represent infiltrate and/or atelectasis.    Interstitial pulmonary  edema.    If more definitive characterization of above-described findings would affect clinical management, CT chest can be considered.            ED POCUS         Procedures including Critical Care        [1] Past Medical History: Diagnosis Date   Diabetes mellitus (CMS-HCC)    Hypertension    Kidney disease    stage IV  [2] Past Surgical History: Procedure Laterality Date   COPD N/A    diabetes N/A    end stage kidney disease N/A    JOINT REPLACEMENT     bilateral knee   [3] Family History Problem Relation Age of Onset   Alcohol  abuse Father    Cancer Mother   [4] Social History Tobacco Use   Smoking status: Former    Current packs/day: 0.00    Types: Cigarettes    Quit date: 02/14/1995    Years since quitting: 29.0   Smokeless tobacco: Never  Substance Use Topics   Alcohol  use: Never   Drug use: Never   Lelon Comer BROCKS, MD Resident 02/16/24 1452

## 2024-02-16 NOTE — ED Notes (Signed)
 [facility] Emergency Department Attestation Note     ED Clinical Impression    Final diagnoses:  SOB (shortness of breath) (Primary)      ED Attending Physician Teaching Attestation    I supervised care provided by the resident. We have discussed the case, I have reviewed the note and I agree with the plan of treatment except as documented in my note.  I have personally performed a face-to-face diagnostic evaluation on this patient.       ED Attending Note    ED Triage Vitals  Enc Vitals Group     BP 02/16/24 0942 (!) 170/72     Pulse 02/16/24 0942 80     SpO2 Pulse --      Resp 02/16/24 0950 (!) 28     Temp 02/16/24 0942 36.4 C (97.5 F)     Temp src --      SpO2 02/16/24 0942 97 %     Weight 02/16/24 0942 95.3 kg (210 lb)   Frances Greene is a 79 y.o. female with a past medical history of HTN, CHF (LVEF 55-60% on 06/01/21), HLD, T2DM, asthma, OSA on CPAP, gout, and CKD3b presenting with six days of progressively worsening shortness of breath that is exacerbated upon exertion and laying flat as well as some BLE edema. Denies nausea, emesis, or chest pain. On exam, the patient appears uncomfortable but in no acute distress. VS are notable for tachypnea to 28, otherwise within normal limits. Physical exam significant for increased respiratory effort with coarse lung sounds to auscultation bilaterally. No wheezing or stridor. She had 1+ edema in her bilateral lower extremities.   Impression, Differential Diagnosis and Plan of Care  Differential includes fluid overload versus CHF exacerbation versus ACS versus viral illness versus pneumonia versus asthma exacerbation versus symptomatic anemia.  Plan for EKG, CXR, POCUS, basic labs, magnesium, troponin, Pro-BNP, and flu/RSV/COVID PCR.   Independent Interpretation of Studies: I have independently reviewed EKG and note normal sinus rhythm at 77, left axis deviation, LVH, no ST or T wave changes concerning for  acute ischemia. External Records Reviewed: Patient's most recent outpatient clinic note 02/07/2024 Pulmonology for [facility] Social determinants that significantly affected care: None applicable History obtained from other sources: None  Additional Progress Notes  EKG shows no ST or T wave changes concerning for acute ischemia.  X-ray does show findings consistent with edema.  Labs are remarkable for elevated BNP, elevated troponin x 2 however this remains flat.  Given elevated troponin and concern for volume overload, will start diuretics and plan for admission for further treatment.  Portions of this record have been created using Scientist, clinical (histocompatibility and immunogenetics). Dictation errors have been sought, but may not have been identified and corrected.  See chart and resident provider documentation for details.    Additional Medical Decision Making   This provider entered the patient's room: Yes:   I have reviewed the patient's vital signs and the nursing notes. Any pertinent labs & imaging results which were available during my care of the patient were reviewed by me.   Documentation assistance was provided by Venetia Kuba, Scribe, on February 16, 2024 at 10:10 AM for Hoy Slade, MD  February 20, 2024 2:21 PM. Documentation assistance provided by the scribe. I was present during the time the encounter was recorded. The information recorded by the scribe was done at my direction and has been reviewed and validated by me.

## 2024-02-22 NOTE — Care Plan (Signed)
 [facility] Care Referral Clinical Care Management phone [phone] fax [phone]  Demographics Patient Name: Frances Greene Date of Birth: April 13, 1945 Gender: Female [facility] Medical Record #: [id number] Billing Address: [address] [place] [address] Destination Address: Mailing Address:  [address] [place] [address]  Anticipated Discharge Date: 02/23/24 Medicare homebound criteria met? Yes Known concerns about discharge environment? No Right to Choice document & provider list(s) provided and discussed with patient/family? Yes Teachable caregiver identified: Caregiver: Extended Emergency Contact Information Primary Emergency Contact: alston,rizelle Mobile Phone: [phone] Relation: Son Secondary Emergency Contact: alston,michelle Mobile Phone: [phone] Relation: Daughter in [place] Additional Information: N/A Language Barrier (if yes, identify language): No  This Document is a Referral Only Additional information, including specific home care orders and instructions, will be provided in the electronic discharge summary.   Clinical Condition []  See attached medical records Admitting Diagnosis:  SOB (shortness of breath) [R06.02] Past Medical History:   has a past medical history of Diabetes mellitus (CMS-HCC), Hypertension, and Kidney disease. Past Surgical History:   has a past surgical history that includes Joint replacement; COPD (N/A); diabetes (N/A); end stage kidney disease (N/A); and pr cath place/coron angio, img super/interp,r&l hrt cath, l hrt ventric (N/A, 02/20/2024).   Name of MD signing discharge orders and contact info: Comer JAYSON Hammersmith Primary Care Physician: Fernand Sigrid Hurl, MD  Services Requested [x]  New Referral []  Resumption of Care  []  This is patient at risk for readmission  Physical Therapy: Please evaluate and treat. NOTE: Weightbearing: full weightbearing Occupational Therapy: Please  evaluate and treat  Additional Order Information:   N/A  Requested Start of Care [facility]):  Within 48 hours of dc.  CCM Case Manager to electronically sign referral when complete.  Charlie JAYSON Candy, MSW

## 2024-02-22 NOTE — Care Plan (Signed)
 Shift Summary Melatonin was administered for sleep after acetaminophen  was refused.  Fall prevention strategies were maintained throughout the shift, including frequent toileting and hourly checks.  Skin protection measures were consistently applied, and no new device-related skin issues were documented.  Remained independent in mobility and feeding, with no assistive devices required.  Overall, remained comfortable and safe with no falls or injuries documented.   Absence of Hospital-Acquired Illness or Injury: Skin and tissue health was maintained with absorbent pads and limited adhesive use, and tubing/devices were kept free from skin contact throughout the shift; no new device-related skin issues documented.   Optimal Comfort and Wellbeing: Pain remained at 0 throughout the shift, and melatonin was administered for sleep.   Absence of Fall and Fall-Related Injury: Fall reduction interventions were consistently maintained, including frequent toileting, hourly visual checks, and use of nonskid footwear; no falls or injuries documented.   Improved Ability to Complete Activities of Daily Living: Remained independent in mobility and feeding, with no assistive devices required.

## 2024-02-22 NOTE — Care Plan (Signed)
 Right to Choose Post-Acute Care Service Providers [facility] [address] Care Management  Patient Patient Name: Frances Greene  Date of Birth: [birth year] [facility] Medical Record #: [id number]   Right to Choice [facility] is pleased to provide you with the attached list of post-acute care service providers who are licensed and available to provide the services you need at the zip code of your discharge destination. This list of providers was extracted from the Centers for Medicare and Medicaid Services on-line list of certified agencies or the in-network list supplied by your commercial insurer. You have the freedom to choose the provider which will provide post-discharge services to you.  When possible, [facility] will respect your goals of care, treatment preferences, and other preferences you express. [facility] does not endorse or guarantee the quality of services provided by the agencies listed. [facility] will make reasonable attempts to arrange services with the providers you identify as top choices, however we cannot guarantee service availability from any agency. If services from your choice of provider(s) are not available within timeframes required for your care, we will contact other providers on the attached list to make arrangements.    Financial Disclosure [facility] is a department of [facility].     For Questions or Concerns If you have any questions about your right to choose or the agencies on the list provided, please contact the person listed below, or the Care Management Department at [phone].

## 2024-02-23 ENCOUNTER — Inpatient Hospital Stay: Admitting: Nurse Practitioner

## 2024-02-23 NOTE — Care Plan (Signed)
 Shift Summary Albuterol was administered PRN to support respiratory comfort during the shift. V/S stable. Oxygen  sat at 97 % on RA. Uses Cpap at night. Peripheral IV site was assessed and maintained without complications or need for intervention. Fall reduction interventions and safety checks were consistently performed, with no falls or injuries reported. Overall, comfort and safety were maintained, and no hospital-acquired complications were documented during the shift.  Absence of Hospital-Acquired Illness or Injury: No hospital-acquired injuries or complications were documented during the shift, and the environment remained safe with regular safety checks and interventions maintained throughout.  Optimal Comfort and Wellbeing: Pain was consistently assessed and remained at zero, and comfort measures were maintained; albuterol was administered PRN to support respiratory comfort.  Absence of Infection Signs and Symptoms: No fever was recorded and IV site remained clean, dry, and intact with no interventions needed; no documentation of infection-related symptoms was present.  Absence of Fall and Fall-Related Injury: Fall reduction interventions were consistently implemented, including scheduled toileting, low bed position, and nonskid footwear, with no falls or injuries reported during the shift.  Improved Ability to Complete Activities of Daily Living: Mobility remained very limited, but feeding was independently performed throughout the shift; activity level was documented as occasional walking.

## 2024-02-23 NOTE — BH Treatment Plan (Signed)
 Treatment Plan Note: Need for Rolling Walker  [name] has a mobility limitation that significantly impairs her ability to participate in one or more mobility related activities of daily living in the home.   A mobility limitation provides [name] from accomplishing MRADL entirely.   [name] is able to safely use the walker.    Tessa L. Tor Sousa, MD

## 2024-02-29 ENCOUNTER — Inpatient Hospital Stay: Admitting: Nurse Practitioner

## 2024-03-14 ENCOUNTER — Ambulatory Visit: Admitting: Nurse Practitioner

## 2024-04-26 ENCOUNTER — Ambulatory Visit: Admitting: Podiatry

## 2024-05-30 ENCOUNTER — Encounter: Admitting: Internal Medicine

## 2024-06-18 ENCOUNTER — Ambulatory Visit: Admitting: Internal Medicine

## 2024-09-17 ENCOUNTER — Ambulatory Visit: Admitting: Nurse Practitioner
